# Patient Record
Sex: Female | Born: 1965 | Race: White | Hispanic: No
Health system: Midwestern US, Community
[De-identification: ages and names within clinical notes are randomized; demographics above are authoritative.]

## PROBLEM LIST (undated history)

## (undated) VITALS — BP 113/74 | HR 87 | Temp 97.5°F | Resp 16

## (undated) VITALS — BP 116/71 | HR 57 | Temp 97.7°F | Resp 16 | Ht 69.0 in | Wt 204.0 lb

## (undated) DIAGNOSIS — R1319 Other dysphagia: Secondary | ICD-10-CM

## (undated) DIAGNOSIS — F418 Other specified anxiety disorders: Secondary | ICD-10-CM

## (undated) DIAGNOSIS — R251 Tremor, unspecified: Secondary | ICD-10-CM

## (undated) DIAGNOSIS — K2100 Gastro-esophageal reflux disease with esophagitis, without bleeding: Secondary | ICD-10-CM

## (undated) DIAGNOSIS — J101 Influenza due to other identified influenza virus with other respiratory manifestations: Secondary | ICD-10-CM

## (undated) DIAGNOSIS — N959 Unspecified menopausal and perimenopausal disorder: Secondary | ICD-10-CM

## (undated) DIAGNOSIS — J111 Influenza due to unidentified influenza virus with other respiratory manifestations: Principal | ICD-10-CM

## (undated) DIAGNOSIS — M25511 Pain in right shoulder: Secondary | ICD-10-CM

## (undated) DIAGNOSIS — Z1231 Encounter for screening mammogram for malignant neoplasm of breast: Principal | ICD-10-CM

## (undated) DIAGNOSIS — K222 Esophageal obstruction: Secondary | ICD-10-CM

## (undated) DIAGNOSIS — F419 Anxiety disorder, unspecified: Secondary | ICD-10-CM

## (undated) DIAGNOSIS — F329 Major depressive disorder, single episode, unspecified: Secondary | ICD-10-CM

## (undated) DIAGNOSIS — F609 Personality disorder, unspecified: Secondary | ICD-10-CM

## (undated) DIAGNOSIS — R51 Headache: Secondary | ICD-10-CM

## (undated) DIAGNOSIS — M1990 Unspecified osteoarthritis, unspecified site: Secondary | ICD-10-CM

## (undated) DIAGNOSIS — F32A Depression, unspecified: Secondary | ICD-10-CM

## (undated) DIAGNOSIS — Z8659 Personal history of other mental and behavioral disorders: Secondary | ICD-10-CM

## (undated) DIAGNOSIS — E785 Hyperlipidemia, unspecified: Secondary | ICD-10-CM

## (undated) DIAGNOSIS — I10 Essential (primary) hypertension: Secondary | ICD-10-CM

## (undated) DIAGNOSIS — F410 Panic disorder [episodic paroxysmal anxiety] without agoraphobia: Secondary | ICD-10-CM

## (undated) DIAGNOSIS — F411 Generalized anxiety disorder: Secondary | ICD-10-CM

## (undated) DIAGNOSIS — M25512 Pain in left shoulder: Secondary | ICD-10-CM

## (undated) DIAGNOSIS — G4700 Insomnia, unspecified: Secondary | ICD-10-CM

## (undated) DIAGNOSIS — N950 Postmenopausal bleeding: Secondary | ICD-10-CM

## (undated) DIAGNOSIS — Z5181 Encounter for therapeutic drug level monitoring: Secondary | ICD-10-CM

## (undated) HISTORY — PX: FOOT SURGERY: SHX648

## (undated) HISTORY — DX: Personality disorder, unspecified: F60.9

## (undated) HISTORY — DX: Hyperlipidemia, unspecified: E78.5

## (undated) HISTORY — DX: Personal history of other mental and behavioral disorders: Z86.59

## (undated) HISTORY — PX: TOTAL ABDOMINAL HYSTERECTOMY W/ BILATERAL SALPINGOOPHORECTOMY: SHX83

---

## 2001-04-08 ENCOUNTER — Other Ambulatory Visit: Admission: RE | Admit: 2001-04-08 | Discharge: 2001-04-08 | Payer: Self-pay | Admitting: Obstetrics and Gynecology

## 2004-03-27 ENCOUNTER — Encounter: Admission: RE | Admit: 2004-03-27 | Discharge: 2004-06-25 | Payer: Self-pay | Admitting: Obstetrics & Gynecology

## 2004-05-10 ENCOUNTER — Ambulatory Visit (HOSPITAL_COMMUNITY): Admission: AD | Admit: 2004-05-10 | Discharge: 2004-05-10 | Payer: Self-pay | Admitting: Obstetrics & Gynecology

## 2004-05-11 ENCOUNTER — Inpatient Hospital Stay (HOSPITAL_COMMUNITY): Admission: AD | Admit: 2004-05-11 | Discharge: 2004-05-15 | Payer: Self-pay | Admitting: Obstetrics & Gynecology

## 2004-07-06 ENCOUNTER — Ambulatory Visit (HOSPITAL_COMMUNITY): Admission: RE | Admit: 2004-07-06 | Discharge: 2004-07-06 | Payer: Self-pay | Admitting: Obstetrics & Gynecology

## 2006-02-10 ENCOUNTER — Emergency Department (HOSPITAL_COMMUNITY): Admission: EM | Admit: 2006-02-10 | Discharge: 2006-02-10 | Payer: Self-pay | Admitting: Emergency Medicine

## 2006-02-10 ENCOUNTER — Inpatient Hospital Stay (HOSPITAL_COMMUNITY): Admission: RE | Admit: 2006-02-10 | Discharge: 2006-02-16 | Payer: Self-pay | Admitting: Psychiatry

## 2006-02-11 ENCOUNTER — Ambulatory Visit: Payer: Self-pay | Admitting: Psychiatry

## 2006-04-15 ENCOUNTER — Ambulatory Visit (HOSPITAL_COMMUNITY): Payer: Self-pay | Admitting: Psychiatry

## 2006-04-23 ENCOUNTER — Ambulatory Visit (HOSPITAL_COMMUNITY): Admission: RE | Admit: 2006-04-23 | Discharge: 2006-04-23 | Payer: Self-pay | Admitting: Obstetrics & Gynecology

## 2006-04-23 ENCOUNTER — Encounter (INDEPENDENT_AMBULATORY_CARE_PROVIDER_SITE_OTHER): Payer: Self-pay | Admitting: Specialist

## 2006-05-22 ENCOUNTER — Ambulatory Visit (HOSPITAL_COMMUNITY): Payer: Self-pay | Admitting: Psychiatry

## 2006-07-17 ENCOUNTER — Ambulatory Visit (HOSPITAL_COMMUNITY): Payer: Self-pay | Admitting: Psychiatry

## 2006-08-14 ENCOUNTER — Ambulatory Visit (HOSPITAL_COMMUNITY): Payer: Self-pay | Admitting: Psychiatry

## 2006-09-17 ENCOUNTER — Inpatient Hospital Stay (HOSPITAL_COMMUNITY): Admission: EM | Admit: 2006-09-17 | Discharge: 2006-09-22 | Payer: Self-pay | Admitting: Psychiatry

## 2006-09-17 ENCOUNTER — Ambulatory Visit: Payer: Self-pay | Admitting: Psychiatry

## 2006-12-16 ENCOUNTER — Ambulatory Visit (HOSPITAL_COMMUNITY): Payer: Self-pay | Admitting: Psychiatry

## 2007-01-03 ENCOUNTER — Inpatient Hospital Stay (HOSPITAL_COMMUNITY): Admission: EM | Admit: 2007-01-03 | Discharge: 2007-01-05 | Payer: Self-pay | Admitting: Emergency Medicine

## 2007-01-05 ENCOUNTER — Ambulatory Visit: Payer: Self-pay | Admitting: Psychiatry

## 2007-01-05 ENCOUNTER — Inpatient Hospital Stay (HOSPITAL_COMMUNITY): Admission: RE | Admit: 2007-01-05 | Discharge: 2007-01-12 | Payer: Self-pay | Admitting: Psychiatry

## 2007-01-20 ENCOUNTER — Ambulatory Visit (HOSPITAL_COMMUNITY): Payer: Self-pay | Admitting: Psychiatry

## 2007-03-12 ENCOUNTER — Ambulatory Visit (HOSPITAL_COMMUNITY): Payer: Self-pay | Admitting: Psychiatry

## 2007-04-09 ENCOUNTER — Ambulatory Visit (HOSPITAL_COMMUNITY): Admission: RE | Admit: 2007-04-09 | Discharge: 2007-04-09 | Payer: Self-pay | Admitting: Obstetrics & Gynecology

## 2007-04-30 ENCOUNTER — Ambulatory Visit (HOSPITAL_COMMUNITY): Payer: Self-pay | Admitting: Psychiatry

## 2007-06-30 ENCOUNTER — Ambulatory Visit (HOSPITAL_COMMUNITY): Admission: RE | Admit: 2007-06-30 | Discharge: 2007-06-30 | Payer: Self-pay | Admitting: Pediatrics

## 2007-09-30 ENCOUNTER — Ambulatory Visit (HOSPITAL_COMMUNITY): Admission: RE | Admit: 2007-09-30 | Discharge: 2007-09-30 | Payer: Self-pay | Admitting: Orthopaedic Surgery

## 2007-10-20 ENCOUNTER — Ambulatory Visit (HOSPITAL_COMMUNITY): Payer: Self-pay | Admitting: Psychiatry

## 2010-08-08 ENCOUNTER — Emergency Department (HOSPITAL_COMMUNITY): Admission: EM | Admit: 2010-08-08 | Discharge: 2010-08-08 | Payer: Self-pay | Admitting: Emergency Medicine

## 2010-08-09 ENCOUNTER — Ambulatory Visit: Payer: Self-pay | Admitting: Psychiatry

## 2010-08-09 ENCOUNTER — Inpatient Hospital Stay (HOSPITAL_COMMUNITY): Admission: RE | Admit: 2010-08-09 | Discharge: 2010-08-11 | Payer: Self-pay | Admitting: Psychiatry

## 2010-11-13 ENCOUNTER — Other Ambulatory Visit
Admission: RE | Admit: 2010-11-13 | Discharge: 2010-11-13 | Payer: Self-pay | Source: Home / Self Care | Admitting: Obstetrics & Gynecology

## 2011-01-08 ENCOUNTER — Inpatient Hospital Stay (HOSPITAL_COMMUNITY)
Admission: RE | Admit: 2011-01-08 | Discharge: 2011-01-14 | Payer: Self-pay | Source: Home / Self Care | Attending: Psychiatry | Admitting: Psychiatry

## 2011-01-14 LAB — CBC
HCT: 42 % (ref 36.0–46.0)
Hemoglobin: 14 g/dL (ref 12.0–15.0)
MCH: 27.3 pg (ref 26.0–34.0)
MCHC: 33.3 g/dL (ref 30.0–36.0)
MCV: 81.9 fL (ref 78.0–100.0)
Platelets: 193 10*3/uL (ref 150–400)
RBC: 5.13 MIL/uL — ABNORMAL HIGH (ref 3.87–5.11)
RDW: 13.3 % (ref 11.5–15.5)
WBC: 9.3 10*3/uL (ref 4.0–10.5)

## 2011-01-14 LAB — BENZODIAZEPINE, QUANTITATIVE, URINE
Alprazolam (GC/LC/MS), ur confirm: 185 NG/ML — ABNORMAL HIGH
Flurazepam GC/MS Conf: NEGATIVE NG/ML
Lorazepam UR QT: NEGATIVE NG/ML
Nordiazepam GC/MS Conf: NEGATIVE NG/ML
Oxazepam GC/MS Conf: NEGATIVE NG/ML
Temazepam GC/MS Conf: NEGATIVE NG/ML

## 2011-01-14 LAB — GLUCOSE, CAPILLARY
Glucose-Capillary: 106 mg/dL — ABNORMAL HIGH (ref 70–99)
Glucose-Capillary: 140 mg/dL — ABNORMAL HIGH (ref 70–99)
Glucose-Capillary: 110 mg/dL — ABNORMAL HIGH (ref 70–99)
Glucose-Capillary: 150 mg/dL — ABNORMAL HIGH (ref 70–99)
Glucose-Capillary: 127 mg/dL — ABNORMAL HIGH (ref 70–99)
Glucose-Capillary: 175 mg/dL — ABNORMAL HIGH (ref 70–99)
Glucose-Capillary: 110 mg/dL — ABNORMAL HIGH (ref 70–99)
Glucose-Capillary: 84 mg/dL (ref 70–99)
Glucose-Capillary: 195 mg/dL — ABNORMAL HIGH (ref 70–99)
Glucose-Capillary: 138 mg/dL — ABNORMAL HIGH (ref 70–99)
Glucose-Capillary: 96 mg/dL (ref 70–99)
Glucose-Capillary: 95 mg/dL (ref 70–99)
Glucose-Capillary: 145 mg/dL — ABNORMAL HIGH (ref 70–99)
Glucose-Capillary: 126 mg/dL — ABNORMAL HIGH (ref 70–99)
Glucose-Capillary: 104 mg/dL — ABNORMAL HIGH (ref 70–99)
Glucose-Capillary: 89 mg/dL (ref 70–99)
Glucose-Capillary: 120 mg/dL — ABNORMAL HIGH (ref 70–99)
Glucose-Capillary: 114 mg/dL — ABNORMAL HIGH (ref 70–99)
Glucose-Capillary: 104 mg/dL — ABNORMAL HIGH (ref 70–99)
Glucose-Capillary: 84 mg/dL (ref 70–99)
Glucose-Capillary: 130 mg/dL — ABNORMAL HIGH (ref 70–99)
Glucose-Capillary: 107 mg/dL — ABNORMAL HIGH (ref 70–99)

## 2011-01-14 LAB — URINALYSIS, ROUTINE W REFLEX MICROSCOPIC
Bilirubin Urine: NEGATIVE
Hgb urine dipstick: NEGATIVE
Ketones, ur: NEGATIVE mg/dL
Nitrite: NEGATIVE
Protein, ur: NEGATIVE mg/dL
Specific Gravity, Urine: 1.026 (ref 1.005–1.030)
Urine Glucose, Fasting: NEGATIVE mg/dL
Urobilinogen, UA: 1 mg/dL (ref 0.0–1.0)
pH: 6 (ref 5.0–8.0)

## 2011-01-14 LAB — TSH: TSH: 1.932 u[IU]/mL (ref 0.350–4.500)

## 2011-01-14 LAB — COMPREHENSIVE METABOLIC PANEL
ALT: 26 U/L (ref 0–35)
AST: 29 U/L (ref 0–37)
Albumin: 3.7 g/dL (ref 3.5–5.2)
Alkaline Phosphatase: 94 U/L (ref 39–117)
BUN: 9 mg/dL (ref 6–23)
CO2: 29 mEq/L (ref 19–32)
Calcium: 9.8 mg/dL (ref 8.4–10.5)
Chloride: 104 mEq/L (ref 96–112)
Creatinine, Ser: 0.86 mg/dL (ref 0.4–1.2)
GFR calc Af Amer: >60 mL/min (ref >60)
GFR calc non Af Amer: >60 mL/min (ref >60)
Glucose, Bld: 134 mg/dL — ABNORMAL HIGH (ref 70–99)
Potassium: 4.5 mEq/L (ref 3.5–5.1)
Sodium: 140 mEq/L (ref 135–145)
Total Bilirubin: 0.3 mg/dL (ref 0.3–1.2)
Total Protein: 7.1 g/dL (ref 6.0–8.3)

## 2011-01-14 LAB — URINE DRUGS OF ABUSE SCREEN W ALC, ROUTINE (REF LAB)
Amphetamine Screen, Ur: NEGATIVE
Barbiturate Quant, Ur: NEGATIVE
Benzodiazepines.: POSITIVE — AB
Cocaine Metabolites: NEGATIVE
Creatinine,U: 181.3 mg/dL
Ethyl Alcohol: <10 mg/dL (ref <10)
Marijuana Metabolite: NEGATIVE
Methadone: NEGATIVE
Opiate Screen, Urine: NEGATIVE
Phencyclidine (PCP): NEGATIVE
Propoxyphene: NEGATIVE

## 2011-01-14 LAB — PREGNANCY, URINE: Preg Test, Ur: NEGATIVE

## 2011-01-14 LAB — URINE MICROSCOPIC-ADD ON

## 2011-01-16 LAB — GLUCOSE, CAPILLARY: Glucose-Capillary: 101 mg/dL — ABNORMAL HIGH (ref 70–99)

## 2011-01-24 NOTE — Discharge Summary (Addendum)
[name], [name]                ACCOUNT NO.:  [id number]  MEDICAL RECORD NO.:  [id number]          PATIENT TYPE:  IPS  LOCATION:  0305                          [facility]ACILITY:  [facility]  PHYSICIAN:  Eulogio Ditch, MD DATE OF BIRTH:  Sep 05, 1966  DATE OF ADMISSION:  01/08/2011 DATE OF DISCHARGE:  01/14/2011                              DISCHARGE SUMMARY   IDENTIFYING INFORMATION:  This is a 45 year old married female.  This is a voluntary admission.  HISTORY OF THE PRESENT ILLNESS:  The second [facility] admission for [facility] who presents with suicidal thoughts and a plan to overdose but was also thinking about shooting herself with a gun, although she had no weapons at home.  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  Panic attacks preventing her from being employed. Also reports having some racing thoughts, thinking of jumping in front of a train.  She reports her most significant stressor is her sister's death, which she continues to grieve.  She endorses dealing with a lot of losses, including her own health problems being a diabetic, grief over the death of her sister in 08/06/11at the age of 19 of ovarian cancer after a 39-month illness.  She has several siblings, but was closest to this sister.  Also dealing with family who are cynical and sarcastic about the patient's illness and symptoms.  She is an outpatient client at [facility] in [place]. She has a history of depression and borderline personality disorder and reports overdosing at least 6 to 7 times in the past.  She is on disability and cares for 2 children, ages 32 and 44, at home.  MEDICAL EVALUATION AND DIAGNOSTIC STUDIES:  Primary care provider is Dr. Joellyn Haff has been managing her medications.  CHRONIC MEDICAL PROBLEMS: 1. Type  2 diabetes. 2. Hypertension.  A full physical exam was done in the emergency room     and is noted in the record.  DIAGNOSTIC STUDIES:  Were unremarkable.  Urine drug screen was positive for benzodiazepines.  TSH 1.932.  Urine pregnancy test negative and urinalysis revealed WBCs 7-10 per high- powered field.  ADMITTING MENTAL STATUS EXAM:  Fully alert female, cooperative, quite demeanor.  Good eye contact.  Good historian of her symptoms and circumstances.  Thinking coherent, goal-directed.  She was able to promise safety on the unit.  No imminent thoughts of suicide while here. No evidence of psychosis.  No delusional statements or flight of ideas evidenced.  Cognitive function intact.  Memory intact.  Her concerns and questions all appropriate and relevant.  ADMITTING DIAGNOSES:  AXIS I:  Major depressive disorder. AXIS II:  Deferred. AXIS III:  History of diabetes and hypertension, pyuria not otherwise specified. AXIS IV:  Problems related to psychosocial issues, lack of employment and bereavement. AXIS V:  Current 35.  COURSE OF HOSPITALIZATION:  She was admitted to our mood disorders program and we elected to gradually taper her off her Effexor as she initially felt that this  was not working very well for her and that she had not felt much improvement in her panic or anxiety or her depression. She had felt that she had done well on Cymbalta in the past so we elected to restart her Cymbalta and augment that with Abilify 2 mg p.o. q.h.s..  She was gradually assimilated into the milieu while here.  Her participation in group therapy and unit activities was appropriate.  Her pyuria remained asymptomatic and she was not treated for a urinary tract infection.  We gradually increased her Abilify to 5 mg p.o. q.h.s., which she tolerated well.  When we increased her Cymbalta 60 mg daily on January 13 she began to complain of headache, which was persistent.  She was attributing it  to the Cymbalta so we ultimately discontinue the Cymbalta and returned to use of the Effexor.  We also did decrease her alprazolam from 2 mg twice a day to 0.5 mg in the morning and 1 mg p.o. q.h.s.  She was also on sliding scale insulin on a sensitive protocol while here.  Her headache was ultimately relieved within 24 hours of discontinuing the Cymbalta and she also took Excedrin Migraine with good relief.  By January 16, she was in full contact with reality.  No suicidal thoughts, feeling good, relaxed and wanted to try continuing on just 75 mg of the Effexor along with the Abilify that she was taking, felt good about this and was tolerating the decrease in the alprazolam dose.  DISCHARGE MENTAL STATUS EXAM:  Revealed a fully alert female, pleasant, cooperative with no dangerous thoughts.  Memory intact.  Fully participating in the milieu and requesting to go home and see her children.  DISCHARGE MEDICATIONS: 1. Alprazolam 1 mg 1/2 tablet by mouth in the morning, 1 tablet at     bedtime. 2. Aripiprazole 5 mg q.h.s. 3. Venlafaxine 75 mg XR daily. 4. Metformin 500 mg b.i.d. 5. Estradiol and the medroxyprogesterone to be continued as previously     taking. 6. Metoprolol tartrate 25 mg 1 tablet b.i.d.  DISCHARGE/PLAN:  Follow up with Dr. Milford Cage in [place] on January 20 at 10:30 and with Sharlot Gowda and the patient will make her own appointment.  Also scheduled at [facility] on January 19 at 8 a.m.     Margaret A. Lorin Picket, N.P.   ______________________________ Eulogio Ditch, MD    MAS/MEDQ  D:  01/17/2011  T:  01/17/2011  Job:  [id number]  Electronically Signed by Kari Baars N.P. on 01/18/2011 11:41:19 AM Electronically Signed by Eulogio Ditch  on 01/24/2011 05:31:27 AM

## 2011-02-21 ENCOUNTER — Inpatient Hospital Stay (HOSPITAL_COMMUNITY)
Admission: RE | Admit: 2011-02-21 | Discharge: 2011-02-27 | DRG: 885 | Disposition: A | Discharge status: Home / Self Care | Payer: Medicare Other | Attending: Psychiatry | Admitting: Psychiatry

## 2011-02-21 DIAGNOSIS — F410 Panic disorder [episodic paroxysmal anxiety] without agoraphobia: Secondary | ICD-10-CM

## 2011-02-21 DIAGNOSIS — F1320 Sedative, hypnotic or anxiolytic dependence, uncomplicated: Secondary | ICD-10-CM

## 2011-02-21 DIAGNOSIS — I10 Essential (primary) hypertension: Secondary | ICD-10-CM

## 2011-02-21 DIAGNOSIS — F609 Personality disorder, unspecified: Secondary | ICD-10-CM

## 2011-02-21 DIAGNOSIS — E119 Type 2 diabetes mellitus without complications: Secondary | ICD-10-CM

## 2011-02-21 DIAGNOSIS — F39 Unspecified mood [affective] disorder: Principal | ICD-10-CM

## 2011-02-21 DIAGNOSIS — R45851 Suicidal ideations: Secondary | ICD-10-CM

## 2011-02-22 DIAGNOSIS — F39 Unspecified mood [affective] disorder: Secondary | ICD-10-CM

## 2011-02-22 LAB — GLUCOSE, CAPILLARY: Glucose-Capillary: 124 mg/dL — ABNORMAL HIGH (ref 70–99)

## 2011-02-23 LAB — GLUCOSE, CAPILLARY: Glucose-Capillary: 114 mg/dL — ABNORMAL HIGH (ref 70–99)

## 2011-02-24 LAB — GLUCOSE, CAPILLARY
Glucose-Capillary: 142 mg/dL — ABNORMAL HIGH (ref 70–99)
Glucose-Capillary: 102 mg/dL — ABNORMAL HIGH (ref 70–99)

## 2011-02-25 LAB — GLUCOSE, CAPILLARY
Glucose-Capillary: 111 mg/dL — ABNORMAL HIGH (ref 70–99)
Glucose-Capillary: 113 mg/dL — ABNORMAL HIGH (ref 70–99)
Glucose-Capillary: 122 mg/dL — ABNORMAL HIGH (ref 70–99)
Glucose-Capillary: 143 mg/dL — ABNORMAL HIGH (ref 70–99)

## 2011-02-26 LAB — GLUCOSE, CAPILLARY
Glucose-Capillary: 119 mg/dL — ABNORMAL HIGH (ref 70–99)
Glucose-Capillary: 155 mg/dL — ABNORMAL HIGH (ref 70–99)

## 2011-02-27 LAB — GLUCOSE, CAPILLARY: Glucose-Capillary: 115 mg/dL — ABNORMAL HIGH (ref 70–99)
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
## 2011-02-28 NOTE — Discharge Summary (Signed)
NAMESHAKENDRA, [name]                ACCOUNT NO.:  [id number]  MEDICAL RECORD NO.:  [id number]           PATIENT TYPE:  I  LOCATION:  0304                          FACILITY:  [facility]  PHYSICIAN:  Anselm Jungling, MD  DATE OF BIRTH:  1966-10-18  DATE OF ADMISSION:  02/21/2011 DATE OF DISCHARGE:  02/27/2011                              DISCHARGE SUMMARY   IDENTIFYING DATA/REASON FOR ADMISSION:  This was an inpatient psychiatric admission for [name], a 45 year old Caucasian female who was last admitted for service in January 2012, about 5 weeks prior.  She was readmitted because of increasing symptoms of anxiety and concern regarding Xanax dependence.  There was also concern about suicidal ideation.  Please refer to the admission note for further details pertaining to the symptoms, circumstances and history that led to her hospitalization.  She was given an initial Axis I diagnosis of benzodiazepine dependence.  MEDICAL AND LABORATORY:  The patient was medically and physically assessed by the psychiatric nurse practitioner.  She was in generally good health but came to [place] with a history of hypertension and diabetes mellitus.  She was continued on her usual lisinopril, hydrochlorothiazide, metoprolol and metformin.  She was also continued on her usual estradiol and medroxyprogesterone.  There were no other significant medical issues during her stay.  HOSPITAL COURSE:  The patient was admitted to the adult inpatient psychiatric service.  She presented as a depressed, anxious and tense- looking individual who was of above-average intelligence and education. Her thoughts and speech were normally organized.  There was nothing to suggest any psychosis or thought disorder.  She was able to give some acknowledgment to the fact that she had been over utilizing her Xanax prescription.  Apparently, her primary care physician became concerned about this and was not willing to prescribe Xanax any  further.  This placed her at risk for withdrawal symptoms that could have been medically hazardous.  She was detoxified utilizing a Librium withdrawal protocol.  Over the course of her stay, she was eventually able to accept that it was necessary for her to come off of Xanax and similar medications.  However, she continued to express concern about the future of her "agoraphobia."  Risperidone was introduced on a low-dose basis to address some of her manifest anxiety.  However, it did not appear to be a medication that was going to have much promise for her in the long run and was not continued at the time of discharge.  She participated in therapeutic groups and activities geared toward helping her acquire better coping skills, a better understanding of her underlying disorders and dynamics, and the development of an aftercare plan.  She also attended 12-step groups during her stay.  On the fifth hospital day, the patient reported that her mood was much better and described it as being like "a light switch turned on."  She indicated that she was no longer hopeless.  She continued to participate in the therapeutic program and was an excellent participant at all times.  She was absent of any active suicidal ideation at any time.  She was appropriate for  discharge on the seventh hospital day.  She agreed to following aftercare plan.  Prior to discharge, she indicated that she was not having any active suicidal ideation.  The suicide risk assessment was completed.  She was felt to be at minimal risk.  AFTERCARE:  The patient was to follow up with Otelia Limes with an appointment on March 04, 2011, at 9:45 a.m. and [facility] in [place] on March 01, 2011, at 8:00 a.m.  DISCHARGE MEDICATIONS: 1. Effexor XR 150 mg daily. 2. Estradiol 2 mg daily. 3. Lisinopril/hydrochlorothiazide 20/25 daily. 4. Medroxyprogesterone 2.5 mg daily. 5. Metformin 500 mg b.i.d. 6.  Metoprolol 25 mg b.i.d. 7. The patient was instructed to stop taking Xanax.  DISCHARGE DIAGNOSES:  AXIS I:  Benzodiazepine dependence, anxiety disorder, not otherwise specified. AXIS II:  Deferred. AXIS III:  History of hypertension, diabetes mellitus. AXIS IV:  Stressors severe. AXIS V:  GAF on discharge 50.     Anselm Jungling, MD     SPB/MEDQ  D:  02/28/2011  T:  02/28/2011  Job:  [id number]  Electronically Signed by Geralyn Flash MD on 02/28/2011 03:27:36 PM

## 2011-03-15 LAB — DIFFERENTIAL
Basophils Absolute: 0.1 10*3/uL (ref 0.0–0.1)
Basophils Relative: 1 % (ref 0–1)
Eosinophils Absolute: 0.2 10*3/uL (ref 0.0–0.7)
Eosinophils Relative: 2 % (ref 0–5)
Monocytes Absolute: 0.6 10*3/uL (ref 0.1–1.0)
Monocytes Relative: 6 % (ref 3–12)
Neutro Abs: 5.5 10*3/uL (ref 1.7–7.7)

## 2011-03-15 LAB — GLUCOSE, CAPILLARY: Glucose-Capillary: 124 mg/dL — ABNORMAL HIGH (ref 70–99)

## 2011-03-15 LAB — CBC
HCT: 42.1 % (ref 36.0–46.0)
Hemoglobin: 14.1 g/dL (ref 12.0–15.0)
MCH: 26.9 pg (ref 26.0–34.0)
MCHC: 33.6 g/dL (ref 30.0–36.0)
MCV: 80.1 fL (ref 78.0–100.0)
Platelets: 189 10*3/uL (ref 150–400)
RBC: 5.26 MIL/uL — ABNORMAL HIGH (ref 3.87–5.11)
RDW: 13.6 % (ref 11.5–15.5)
WBC: 8.9 10*3/uL (ref 4.0–10.5)

## 2011-03-15 LAB — URINALYSIS, ROUTINE W REFLEX MICROSCOPIC
Bilirubin Urine: NEGATIVE
Glucose, UA: NEGATIVE mg/dL
Hgb urine dipstick: NEGATIVE
Ketones, ur: NEGATIVE mg/dL
Nitrite: NEGATIVE
Protein, ur: NEGATIVE mg/dL
Specific Gravity, Urine: 1.015 (ref 1.005–1.030)
Urobilinogen, UA: 0.2 mg/dL (ref 0.0–1.0)
pH: 5.5 (ref 5.0–8.0)

## 2011-03-15 LAB — RAPID URINE DRUG SCREEN, HOSP PERFORMED
Amphetamines: NOT DETECTED
Barbiturates: NOT DETECTED
Benzodiazepines: POSITIVE — AB
Cocaine: NOT DETECTED
Opiates: NOT DETECTED
Tetrahydrocannabinol: NOT DETECTED

## 2011-03-15 LAB — ETHANOL: Alcohol, Ethyl (B): <5 mg/dL (ref 0–10)

## 2011-03-15 LAB — BASIC METABOLIC PANEL
BUN: 9 mg/dL (ref 6–23)
CO2: 30 mEq/L (ref 19–32)
Calcium: 9.7 mg/dL (ref 8.4–10.5)
Chloride: 101 mEq/L (ref 96–112)
Creatinine, Ser: 0.9 mg/dL (ref 0.4–1.2)
GFR calc Af Amer: >60 mL/min (ref >60)
GFR calc non Af Amer: >60 mL/min (ref >60)
Glucose, Bld: 106 mg/dL — ABNORMAL HIGH (ref 70–99)
Potassium: 4.3 mEq/L (ref 3.5–5.1)
Sodium: 139 mEq/L (ref 135–145)

## 2011-03-15 LAB — PREGNANCY, URINE: Preg Test, Ur: NEGATIVE

## 2011-05-17 NOTE — Op Note (Signed)
NAME:  [name], [name]                          ACCOUNT NO.:  [id number]   MEDICAL RECORD NO.:  [id number]                   PATIENT TYPE:  AMB   LOCATION:  DAY                                  FACILITY:  [facility]   PHYSICIAN:  Lazaro Arms, M.D.                DATE OF BIRTH:  07/29/1966   DATE OF PROCEDURE:  07/06/2004  DATE OF DISCHARGE:                                 OPERATIVE REPORT   PREOPERATIVE DIAGNOSES:  1. Bilateral pelvic pain.  2. Menstrual migraines, unresponsive.   POSTOPERATIVE DIAGNOSES:  1. Bilateral pelvic pain.  2. Menstrual migraines, unresponsive.   PROCEDURE:  Laparoscopic bilateral salpingo-oophorectomy.   SURGEON:  Lazaro Arms, M.D.   ANESTHESIA:  General endotracheal.   FINDINGS:  The patient had what appeared to be a normal pelvis.  Uterus,  tubes and ovaries were normal.  The upper and lower quadrants were normal.   DESCRIPTION OF OPERATION:  The patient was taken to the operating room and  placed in supine position, where she underwent general endotracheal  anesthesia, placed in dorsal lithotomy position and prepped and draped in  the usual sterile fashion.  A Foley catheter was placed.  Incision was made  in the umbilicus.  Veress needle was placed into the peritoneal cavity with  1 pass.  Abdomen was insufflated.  A non-bladed trocar was then placed in  the peritoneal cavity with 1 pass.  Peritoneal cavity was confirmed.  Insufflation was continued.  A 5-mm trocar and an 11-mm trocar was placed in  the midline suprapubic region and the right lower quadrant, respectively,  without difficulty under direct continuous visualization.  The right ovary  was grasped and the 10-mm Gyrus forceps was used.  A cautery of the  infundibulopelvic ligament was performed and it was cut at the same time;  there was good hemostasis.  The right ovary and tube were removed  hemostatically.  The left ovary and tube were then grasped.  The Gyrus 10-mm  forceps was  used.  The infundibulopelvic ligament was coagulated and cut and  the utero-ovarian ligament was coagulated and cut.  Both ovaries were  liberated.  I could not get them both out in the same EndoCatch bag, so I  had to use separate new Catch bags to remove them out of the right lower  quadrant.  Both pedicles were hemostatic.  Irrigation was performed and  again, pedicles were hemostatic.  The gas was allowed to escape.  The fascia  of the umbilical and right lower quadrant incisions were closed with 0  Vicryl sutures and the skin was closed using skin staples bilaterally and  the suprapubic skin was closed as well with skin staples.  Marcaine 0.5% was  injected in each site for postoperative pain.  The patient tolerated the  procedure well, she experienced minimal blood loss and was taken to the  recovery room in good  and stable condition.  All counts were correct.  She  received Ancef prophylactically.  All specimens went to the lab.      ___________________________________________                                            Lazaro Arms, M.D.   LHE/MEDQ  D:  07/06/2004  T:  07/06/2004  Job:  [id number]

## 2011-05-17 NOTE — Op Note (Signed)
[name], [name]                ACCOUNT NO.:  [id number]   MEDICAL RECORD NO.:  [id number]          PATIENT TYPE:  AMB   LOCATION:  DAY                           FACILITY:  [facility]   PHYSICIAN:  Lazaro Arms, M.D.   DATE OF BIRTH:  10/18/1966   DATE OF PROCEDURE:  04/23/2006  DATE OF DISCHARGE:  04/23/2006                                 OPERATIVE REPORT   PREOPERATIVE DIAGNOSES:  1.  Menometrorrhagia.  2.  Thickened stripe.   POSTOPERATIVE DIAGNOSIS:  1.  Menometrorrhagia.  2.  Thickened stripe.   PROCEDURE:  Hysterectomy, dilatation and curettage, endometrial ablation.   SURGEON:  Lazaro Arms, M.D.   ANESTHESIA:  General endotracheal.   FINDINGS:  Normal endometrial cavity.   DESCRIPTION OF OPERATION:  The patient was taken to the operating room and  placed in supine position, underwent general endotracheal anesthesia.  Placed in dorsal lithotomy position, prepped and draped in the usual sterile  fashion.  The cervix was grasped.  Dilated serially to allow passage of the  hysteroscope.  Hysteroscopy was performed and found to be normal.  A  vigorous uterine curettage was then performed.  Good uterine cry was  obtained in all areas.  The ThermaChoice III balloon was then used and  inflated to maintain a pressure of 190 mmHg or greater and heated to 87  degrees Celsius for the entire period of time.  The therapy time was then  completed.  All the fluid was obtained at the end of the procedure.  The  patient tolerated the procedure well.  She experienced minimal blood loss  and was taken to recovery in good and stable condition.  All counts were  correct.      Lazaro Arms, M.D.  Electronically Signed     LHE/MEDQ  D:  06/30/2006  T:  06/30/2006  Job:  [id number]

## 2011-05-17 NOTE — H&P (Signed)
[name], SNEED                ACCOUNT NO.:  [id number]   MEDICAL RECORD NO.:  [id number]          PATIENT TYPE:  AMB   LOCATION:  DAY                           FACILITY:  [facility]   PHYSICIAN:  Lazaro Arms, M.D.   DATE OF BIRTH:  [birth year]   DATE OF ADMISSION:  04/23/2006  DATE OF DISCHARGE:  LH                                HISTORY & PHYSICAL   HISTORY OF PRESENT ILLNESS:  [name] is a 45 year old white female who is  actually status post an oophorectomy because of migraine headaches.  The  patient, at this time, did not want to undergo hysterectomy and she has  horrible migraines and they were significantly exacerbated with her menses  and, as a result, I did a laparoscopic oophorectomy back in July 2005.  Her  migraine headaches have been significantly better since that time and she  has been able to return to work and a relatively normal life.  However, we  have been unable to control her bleeding with her hormone replacement  therapy.  We have used Megace, Provera, and coming off the Provera also  triggers migraines.  As a result, we are going to proceed with a  hysteroscopy, D&C, and endometrial ablation.   PAST MEDICAL HISTORY:  Significant for migraine headaches, depression, and  panic attacks.   PAST SURGICAL HISTORY:  She has had foot surgery as well as her laparoscopic  BSO.   PAST OB HISTORY:  Two vaginal deliveries.   CURRENT MEDICATIONS:  Cymbalta, Neurontin, Valium, aspirin, all of which  [facility].   REVIEW OF SYSTEMS:  Otherwise, negative.   PHYSICAL EXAMINATION:  HEENT:  Unremarkable.  NECK:  Thyroid normal.  LUNGS:  Clear.  HEART:  Regular rate and rhythm without murmurs, gallops, and rubs.  BREASTS:  Without masses, discharge, or skin changes.  ABDOMEN:  Benign, no hepatosplenomegaly or masses.  PELVIC:  She has normal external genitalia. Vagina pink and moist without  discharge.  Uterus normal size, shape and contour.  Adnexa negative.  EXTREMITIES:  Warm, no edema.   IMPRESSION:  Menometrorrhagia on hormone replacement therapy.   PLAN:  The patient is admitted for hysteroscopy, D&C, and endometrial  ablation.  She understands the risks, benefits, indications and  alternatives, and will proceed.      Lazaro Arms, M.D.  Electronically Signed     LHE/MEDQ  D:  04/22/2006  T:  04/22/2006  Job:  [id number]
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
## 2011-05-17 NOTE — H&P (Signed)
[name]                ACCOUNT NO.:  [id number]   MEDICAL RECORD NO.:  [id number]          PATIENT TYPE:  INP   LOCATION:  A219                          FACILITY:  [facility]   PHYSICIAN:  Margaretmary Dys, M.D.DATE OF BIRTH:  12/07/1966   DATE OF ADMISSION:  01/03/2007  DATE OF DISCHARGE:  LH                              HISTORY & PHYSICAL   PRIMARY CARE PHYSICIAN:  Unassigned.   ADMISSION DIAGNOSES:  1. Drug overdose.  2. History of severe depression.  3. Tachycardia.   CHIEF COMPLAINT:  The patient took multiple pills earlier today.   HISTORY OF PRESENT ILLNESS:  [name] is a 45 year old Caucasian  female who was brought into the emergency room after taking multiple  pills. The patient reports that at about 12 noon, she wrote a suicide  note and took several pills of Risperdal, Trazodone, Diazepam. The  patient reported that she may have taken about 10 to 15 pills of each.   Her suicide note was revealed in detail and she describes having  significant financial difficulties and being in severe depression and  not being able to take care of her 2 children. She reports that the  credit cards have been phoning her daily. She does not have a full-time  job and has not been able to meet with her financial obligations. Her  husband was present when I was talking to her. The patient, in the  emergency room, was found to be fairly drowsy, likely from those  medications but she was also tachycardiac. It was decided that prior to  sending her to a psychiatric facility, the patient's heart rate needed  to be brought under better control. She received a bolus of fluids in  the emergency room. The patient has tried to attempt suicide in the  past, about 10 to 12 years ago, which was at [facility]. According to the patient, she thinks she took 20 tablets of  Risperdal, 20 mg of Valium at 5 mg strength each, and 15 tablets of  Trazodone at 50 mg, and 6  tablets of Cymbalta at 60 mg. She does take  Lithium but did not overdose on the Lithium and her levels in the  emergency room was sub-therapeutic.   REVIEW OF SYSTEMS:  Difficult to obtain, as the patient was fairly  drowsy, although she told me that she felt comfortable. She denies any  headaches. No light headedness. No chest pain. No shortness of breath.  No abdominal pain.   PAST MEDICAL HISTORY:  1. Bipolar disorder.  2. Depression.  3. History of suicide attempt about 10 years ago.  4. History of panic attacks.  5. Bilateral salpingo-oophorectomy.   SOCIAL HISTORY:  The patient is married for about 17 years. She has 2  children, a 83 and a 42 year old. She denies any smoking, alcohol, or  illicit drug use. She does not work full time.   ALLERGIES:  NO KNOWN DRUG ALLERGIES.   FAMILY HISTORY:  Positive for history of hypertension, although the rest  was difficult to obtain.   PAST  SURGICAL HISTORY:  The patient had foot surgery in the past.   HOME MEDICATIONS:  1. Hydrocodone/acetaminophen 7.5/650 mg 1 to 2 tablets q.4 p.r.n.  2. Gabapentin 100 mg p.o. t.i.d.  3. Lithium tablets 150 mg p.o. once daily.  4. Oxycodone 5/500 1 to 2 tablets as needed.  5. Diazepam 5 mg p.o. b.i.d.  6. Trazodone 50 mg p.o. as needed.  7. Cymbalta 60 mg p.o. as needed.   PHYSICAL EXAMINATION:  GENERAL:  The patient is fairly drowsy but was  oriented to time, place, and person. Was easily arousable.  VITAL SIGNS:  On arrival in the emergency room, blood pressure was  145/80. Pulse was 126. This increased to 150 at one point. Respiratory  rate 18. Temperature 96.5. Oxygen saturation was 98% on room air.  HEENT:  Normocephalic and atraumatic. Oral mucosa was moist. Pupils were  sluggish but both reactive.  NECK:  Supple. No JVD or lymphadenopathy.  LUNGS:  Clear clinically. Good air entry bilaterally.  HEART:  S1 and S2. Tachycardiac. No S3, S4, gallops, or rubs.  ABDOMEN:  Soft, nontender.  Bowel sounds positive. No masses palpable.  EXTREMITIES:  No pitting pedal edema. No calf induration or tenderness  was noted.  NEUROLOGIC:  She was drowsy with no focal neurological deficits noted.   LABORATORY DATA:  White blood cell count was 6.0. Hemoglobin and  hematocrit 15 and 46.1. Platelet count was 218,000 with no left shift.  Sodium 135, potassium 3.7, chloride 100, CO2 25, glucose 145, BUN 10,  creatinine 0.72. Calcium 9.6. Cardiac enzymes were negative. Lithium  level was less than 0.25. Acetaminophen level was less than 10.  Salicylate level was less than 10. Urine toxicology screen was positive  for benzodiazepines. Urinalysis was negative.   A 12 lead EKG showed sinus tachycardiac with no acute STT changes.   ASSESSMENT:  [name] is a 45 year old Caucasian female  admitted with drug overdose. The patient reportedly took several pills,  as mentioned above at about 12 noon and then she called EMS about 30  minutes later. She called her husband prior to taking the pills but did  not talk to him. She also left a suicide note, detailing that she is  very depressed and has significant financial and social stressors. The  patient is still fairly drowsy, likely from the dose of medications and  she is also tachycardiac, the exact reason is unclear but I continue to  suspect it is probably related to some of the medications that she took.   PLAN:  Admit her at this time to telemetry. Will continue IV fluids at  200 cc an hour. Will put her on suicide precautions. Will hold all of  her home medications at this time, until we get a clear picture of what  she took and until sensorium improves. Once we have been able to do  this, we will get an ACT Team consult and plan to transfer her for  psychiatric help. I have discussed this plan with the husband and he  verbalized understanding.  The patient also realizes that she will have to be transferred to a  psychiatric  institution. Further discussions will be held with her when  her sensorium improves.      Margaretmary Dys, M.D.  Electronically Signed     AM/MEDQ  D:  01/04/2007  T:  01/04/2007  Job:  [id number]

## 2011-05-17 NOTE — Op Note (Signed)
NAME:  [name], [name]                          ACCOUNT NO.:  [id number]   MEDICAL RECORD NO.:  [id number]                   PATIENT TYPE:  INP   LOCATION:  A427                                 FACILITY:  [facility]   PHYSICIAN:  Lazaro Arms, M.D.                DATE OF BIRTH:  1966-09-07   DATE OF PROCEDURE:  05/11/2004  DATE OF DISCHARGE:                                 OPERATIVE REPORT   PROCEDURE:  Epidural placement.   INDICATION:  [name] is a 45 year old, gravida 2, para 1, in active phase of  labor requesting an epidural.   DESCRIPTION OF PROCEDURE:  She is placed in a sitting position after boluses  infused.  L2-L3 interspaces identified;  Betadine prep is used.  Local  anesthetic is injected.  A 17 gauge Tuohy needle is used after field drape  placed, loss of resistance technique employed, and the epidural space is  found without difficulty.  Using the loss of resistance technique, 10 mL of  0.125% bupivacaine is placed without ill effects.  The epidural catheter is  fed into the epidural space, and it is taped down 5 cm into the space.  Then  10 mL of 0.125% bupivacaine plain is given into the epidural catheter to  dose up the epidural.  It is then taped down.  The epidural continuous pump  in then hooked up at 12 mL/hr.  The patient is tolerating it well with no  ill effects.      ___________________________________________                                            Lazaro Arms, M.D.   LHE/MEDQ  D:  05/11/2004  T:  05/13/2004  Job:  [id number]

## 2011-05-17 NOTE — H&P (Signed)
[name], [name]                ACCOUNT NO.:  [id number]   MEDICAL RECORD NO.:  [id number]          PATIENT TYPE:  IPS   LOCATION:  0500                          FACILITY:  [facility]   PHYSICIAN:  Geoffery Lyons, M.D.      DATE OF BIRTH:  31-Aug-1966   DATE OF ADMISSION:  01/05/2007  DATE OF DISCHARGE:                       PSYCHIATRIC ADMISSION ASSESSMENT   IDENTIFICATION:  This is a 45 year old married white female.  This is a  voluntary admission.   HISTORY OF PRESENT ILLNESS:  The patient took an intentional overdose of  multiple medications and wrote a suicide note approximately lunchtime on  the January 03, 2007 and was subsequently admitted through the emergency  room for medical stabilization.  She took approximately 10-20 tablets  each of Risperdal 0.25 mg, diazepam 5 mg and trazodone 50 mg and then  also took approximately 6 tablets of Cymbalta 60 mg.  Her suicide note  notes that she feels hopeless about the fact that she is 45 years old,  her marriage is over.  Does not have a stable job and is being hounded  by [employer].  She notes that she hates herself and feels unable to be a  good mother and provide proper care and financial support to her two  children.  She sites chronic marital discord since she was married,  stating that she had been married for the past 17 years and they have  disagreed for 18 years.  She denies any homicidal thought,  hallucinations, paranoia or flight of ideas.   PAST PSYCHIATRIC HISTORY:  The patient is followed by Dr. Kathryne Sharper in  the [facility] outpatient clinic [place]  This is  her second admission to [facility] with a  previous admission being in September 2007.  She has been treated for  bipolar disorder and recently her outpatient physician added lithium to  her medication regimen but she had not filled it.  She reports being  stable in the past on Effexor XR which did the best for her.   She had  also recently been prescribed Risperdal 0.25 mg to take once a day at  night and had been taking it regularly.  In the past she has been  prescribed Prozac which gave her suicidal thoughts, Zoloft which she  felt did not work well, Tofranil and Paxil also which she felt did  nothing for her and she has been prescribed Celexa but does not remember  how well at work.  She has also taken Wellbutrin but does not remember  how effective it was.   SOCIAL HISTORY:  The patient married for 17 years with chronic discord.  Has two children ages 84 and 2.  She is currently unemployed.  No  history of substance abuse.  Lives at home with her husband and two  children.  Has had a lot of chronic problems with employment and having  inadequate finances over the course of the past year which she cites as  a stressor for her.  She has also cited menopausal state as a stressor  contributing to her depression.   Family history is noncontributory.   ALCOHOL AND DRUG HISTORY:  She denies any current or past history of  substance abuse.   MEDICAL HISTORY:  The patient is followed by Dr. Vivia Ewing, her  primary care physician.  Medical problems include edema and status post  polypharmacy overdose.   CURRENT MEDICATIONS:  1. Cymbalta 60 mg daily for about 1 year.  2. Valium 5 mg b.i.d.  3. Had been prescribed trazodone 50 mg but has not been taking that.  4. Previously on Neurontin 100 mg t.i.d.. also not taking.  5. Wears a Vivelle patch 1 mg.  6. Was prescribed lithium but did not take it.  7. Risperdal 0.25 mg at bedtime.   Drug allergies are none.   POSITIVE PHYSICAL FINDINGS:  GENERAL:  Well-nourished, well-developed  overweight female, 5 feet 4 inches tall, 206 pounds.  VITAL SIGNS:  Temperature 97.6, pulse 96, respirations 20, blood  pressure 146/81.  Full physical exam was done in the internal medicine  unit along with review of systems by Dr. Reather Littler as noted in the   record.  She was admitted from the emergency room to the medicine unit  for drowsiness after the overdose along with sinus tachycardia for close  observation and was transferred to behavioral health unit on the 7th.   DIAGNOSTIC STUDIES:  WBC 5.9, hemoglobin 11.2, hematocrit 33.7,  platelets 166,000, MCV 79.5.  Chemistries:  Sodium 136, potassium 3.9,  chloride 107, carbon dioxide 24, BUN 5, creatinine 0.8 and random  glucose 116.  Liver enzymes within normal limits.  SGOT 27, SGPT 29,  alkaline phosphatase 103 and total bilirubin 0.5.  TSH within normal  limits at 2.152. Cardiac markers were unremarkable.  Lithium level on  arrival in the ED was less than therapeutic at 0.25.  Salicylate and  acetaminophen levels were negative.  Urine drug screen positive for  benzodiazepines. UA within normal limits.   MENTAL STATUS EXAM:  Fully alert female, cooperative with blunted affect  but pleasant and appropriate.  Mood is depressed, feels very hopeless  about her marriage.  Frustrated with the relationship, feeling trapped,  nowhere to go.  Has two children at home.  No way to support herself if  she leaves home.  Speech is normal in production and fluency.  She is  articulate.  Expresses her thoughts adequately.  Mood is depressed.  Thought processes logical.  No evidence of psychosis.  Positive for  suicidal ideation, thinking of overdosing.  Able to contract for safety  on the unit.  No homicidal thought.  Cognition is intact, well  preserved.  She is fully alert in full contact with reality.  Concentration and calculation are intact.  Insight is adequate.  We note  that a written copy of her suicide note is in the record for reference.  She appears to be an accurate historian.   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  AXIS II:  Deferred.  AXIS III:  Edema.  Status post polypharmacy overdose. AXIS IV:  Severe marital discord, chronic and poor personal and  social  supports.  Financial problems are also an issue.  AXIS V:  Current 30, past year 77.   Plan is to voluntarily admit the patient with every 15 minute checks in  place with a goal of alleviating her suicidal thought.  She really feels  the Cymbalta is not helping her so we are going to discontinue that  at  this point.  She is interested in restarting Effexor since she felt that  that is the medication that really helped her a lot and she was able to  remain stable on that for quite a long period of time, so we are going  to start her on Effexor 37.5 mg daily.  Meanwhile, she does have some  significant 2+ pitting edema in her lower extremities and around her  hips and abdomen and today we will give her 40 mg of Lasix to reduce the  edema and will monitor her closely.  May give her  a second dose tomorrow  depending on her symptoms.  She is willing to  have her husband come for a session and we have her enrolled in  stabilization program and she is in agreement with the plan.   Estimated length of stay is 7 days.      Margaret A. Scott, N.P.      Geoffery Lyons, M.D.  Electronically Signed    MAS/MEDQ  D:  01/06/2007  T:  01/07/2007  Job:  [id number]

## 2011-05-17 NOTE — Discharge Summary (Signed)
[name], [name]                ACCOUNT NO.:  [id number]   MEDICAL RECORD NO.:  [id number]          PATIENT TYPE:  INP   LOCATION:  A219                          FACILITY:  [facility]   PHYSICIAN:  Osvaldo Shipper, MD     DATE OF BIRTH:  [birth year]   DATE OF ADMISSION:  01/03/2007  DATE OF DISCHARGE:  01/07/2008LH                               DISCHARGE SUMMARY   Please note the patient is actually being transferred to [facility] for inpatient psychiatric care.   Please review the H&P by Dr. Sherle Poe for details regarding the  patient's presenting illness.   DISCHARGE DIAGNOSIS:  1. Suicidal attempt with intentional drug overdose, requiring      inpatient psychiatric evaluation.  2. History of depression with history of suicidal attempts in the      past.  3. Sinus tachycardia, improved.   BRIEF HOSPITAL COURSE:  Briefly this is a 45 year old Caucasian female  who has a history of severe depression who was brought into the ED after  she took multiple pills. The patient is on multiple psychotropic agents  and she apparently overdosed on most of them.  The patient was quite  tachycardiac when she was evaluated in the ED; and hence she was  admitted to the hospital for further medical management.  Apparently  there was a suicidal note also found with the patient when she was  brought into the ED.  The note describes in detail what stressors the  patient is undergoing and mentions her 2 children as well.   The patient is admitted to the medical floor with a sitter.  She was  given some IV fluids, soon her tachycardia improved.  She was still  having a heart rate between 90 and 100.  She was without any symptoms  today.  She was tolerating p.o. intake quite well.  Her other vital  signs were pretty stable, saturating well as well.  Her blood work  showed no significant abnormalities.  TSH was normal.  Serum  acetaminophen, lithium, and salicylate levels were all  normal.  Urine  drug screen was positive for benzodiazepine.  There was no UTI that was  noted.   Hence, today we felt that the patient was medically stable to be sent  over to [facility] for further psychiatric care.   Act Team has been consulted, and they are initiating discussions with  the psychiatrist and the patient most likely will be transferred  sometime today.   DISCHARGE MEDICATIONS:  I am not starting this patient on any  psychiatric medications at this time.  Based on the H&P she was on:  Gabapentin, lithium, diazepam, Trazodone, Cymbalta.  She was also on  oxycodone and hydrocodone for unclear reasons.  I think that all of her  psychiatric medications will probably be best reinitiated by her  psychiatrist at this time.   DIET:  She may have regular a diet.   PHYSICAL ACTIVITY:  No problems with ambulation.  She may move around as  needed and ad lib.   Further disposition to be decided  when the patient is discharged from  the [facility].   TOTAL TIME SPENT AT DISCHARGE:  35 minutes.      Osvaldo Shipper, MD  Electronically Signed     GK/MEDQ  D:  01/05/2007  T:  01/05/2007  Job:  [id number]

## 2011-05-17 NOTE — Op Note (Signed)
NAME:  [name], [name]                          ACCOUNT NO.:  [id number]   MEDICAL RECORD NO.:  [id number]                   PATIENT TYPE:  INP   LOCATION:  A427                                 FACILITY:  [facility]   PHYSICIAN:  Lazaro Arms, M.D.                DATE OF BIRTH:  1966/07/27   DATE OF PROCEDURE:  DATE OF DISCHARGE:  05/15/2004                                 OPERATIVE REPORT   DELIVERY NOTE   [name] is a 45 year old, gravida 2 para 1 with severe pre-eclampsia and  elevated liver enzymes who has progressed slowly through the active phase of  labor.  Began internal expulsive efforts and over a midline episiotomy  delivered a viable female infant at 13 with Apgars of 8 and 9.  Placenta  was normal and intact.  The uterus contracted down nicely.  The baby weighed  7 pounds 7.3 ounces.  Midline episiotomy was repaired without difficulty.  Estimated blood loss for the delivery was 350 cc.  The patient will undergo  postpartum magnesium sulfate prophylaxis.      ___________________________________________                                            Lazaro Arms, M.D.   Loraine Maple  D:  05/25/2004  T:  05/25/2004  Job:  [id number]

## 2011-05-17 NOTE — Discharge Summary (Signed)
NAME:  [name], [name]                          ACCOUNT NO.:  [id number]   MEDICAL RECORD NO.:  [id number]                   PATIENT TYPE:  INP   LOCATION:  A427                                 FACILITY:  [facility]   PHYSICIAN:  Lazaro Arms, M.D.                DATE OF BIRTH:  1966/01/25   DATE OF ADMISSION:  05/11/2004  DATE OF DISCHARGE:  05/15/2004                                 DISCHARGE SUMMARY   DISCHARGE DIAGNOSES:  1. Status post vaginal delivery.  2. Severe preeclampsia.  3. Elevated postpartum blood pressure.   PROCEDURES:  1. Induction of labor.  2. Vaginal delivery.   Please refer to the transcribed history and physical and antepartum chart.  Details are as mentioned in the hospital.   HOSPITAL COURSE:  The patient was admitted, underwent magnesium sulfate  prophylaxis, her labor was induced. She had a vaginal delivery.  Please see  the delivery note for details.  She also had an epidural placed for labor  management.  After delivery she was kept on magnesium sulfate for 36 hours  for seizure prophylaxis.  She was begun on Procardia XL, Maxzide 75/50 and  Altace for elevated blood pressure.  She was given Valium 3 times a day  because of a history of Xanax use and also to increase her seizure  threshold.  She was discharged home, also, with Procardia 10 mg sublingual  to use as needed.  The patient's liver function tests remained elevated.  On  postpartum day #3 the AST was 58, ALT was 63. Her platelet count remained  normal.  The rest of her labs were normal.  She was discharged to home on  postpartum day #4 in good stable condition to follow up in the office at the  end of the week to have her blood pressure checked.  Of note, the infant was  transferred to [facility] because of questionable seizure  activity.  Cord gas in the baby was normal.     ___________________________________________                                         Lazaro Arms,  M.D.   LHE/MEDQ  D:  05/25/2004  T:  05/25/2004  Job:  [id number]

## 2011-05-17 NOTE — H&P (Signed)
NAME:  [name], [name]                          ACCOUNT NO.:  [id number]   MEDICAL RECORD NO.:  [id number]                   PATIENT TYPE:  INP   LOCATION:  A427                                 FACILITY:  [facility]   PHYSICIAN:  Lazaro Arms, M.D.                DATE OF BIRTH:  Jun 27, 1966   DATE OF ADMISSION:  05/11/2004  DATE OF DISCHARGE:                                HISTORY & PHYSICAL   HISTORY OF PRESENT ILLNESS:  [name] is a 45 year old white female, gravida 2,  para 1, abortus 0, with estimated date of delivery of June 04, 2004,  currently at 36-4/7 weeks' gestation, who has class A-1 diabetes.  The  patient had interestingly preeclampsia with her first pregnancy back in 1996  and ended up getting delivered by induction at 34 weeks after premature  rupture of the membranes.  This pregnancy, her blood pressures have been  okay; they have been sort of in the 140/80 range and she came in 2 days ago,  140/90; today, she is 170/100.  She had 2+ protein.  We did a 24-hour urine  which revealed 3600 mg of protein in 24 hours; she is still dipping 2+  today.  As a result of the elevated of the elevated proteinuria and blood  pressure, she is admitted for magnesium sulfate tocolysis and induction of  labor; she will plan to do an epidural.   PAST MEDICAL HISTORY:  1. Migraine headaches.  2. Depression.  3. Panic attacks.   PAST SURGICAL HISTORY:  Foot surgery.   PAST OBSTETRICAL HISTORY:  Past OB is stated as above back in 1996.   MEDICATIONS:  Effexor and Xanax as needed.   REVIEW OF SYSTEMS:  Review of systems is as per HPI, otherwise, negative.   PRENATAL LABORATORY DATA:  Her blood type is A-positive, antibody screen is  negative.  All of her labs are normal.  Her group B strep is pending; I just  do not have the report on the chart and we will track that down.   PHYSICAL EXAMINATION:  HEENT:  Unremarkable.  NECK:  Her thyroid is normal.  LUNGS:  Lungs clear.  BREASTS:   Deferred.  ABDOMEN:  Fundal height was 39 cm.  PELVIC:  Cervix is 1-2, thick ballotable.  She does have polyhydramnios as  well.  EXTREMITIES:  Extremities are warm with 2+ edema.   IMPRESSION:  1. Intrauterine pregnancy at 36-5/7 weeks' gestation.  2. Class A-1 diabetes.  3. Preeclampsia.  4. Polyhydramnios.   PLAN:  The patient is admitted for induction of labor, magnesium sulfate  tocolysis.  We will plan to do an epidural.     ___________________________________________  Lazaro Arms, M.D.   Loraine Maple  D:  05/11/2004  T:  05/11/2004  Job:  [id number]

## 2011-05-17 NOTE — Group Therapy Note (Signed)
NAMESOILA, PRINTUP                ACCOUNT NO.:  [id number]   MEDICAL RECORD NO.:  [id number]          PATIENT TYPE:  INP   LOCATION:  A219                          FACILITY:  [facility]   PHYSICIAN:  Margaretmary Dys, M.D.DATE OF BIRTH:  1966-08-25   DATE OF PROCEDURE:  DATE OF DISCHARGE:                                 PROGRESS NOTE   SUBJECTIVE:  The patient was admitted for a polypharmacy overdose  yesterday.  She was quite drowsy and tachycardic.  Today, her heart rate  has improved, but still is more tachycardic than her baseline.  She is  more awake this morning.   OBJECTIVE:  Conscious, alert, well-oriented in time, place, and person.  The patient requesting to eat a regular diet.  VITAL SIGNS:  Blood pressure 101/64, pulse 133, respirations 18, T-max  97.8, oxygen saturation 99% on room air.  HEENT:  Normocephalic, atraumatic.  Oral mucosa was moist with no  exudate.  NECK:  Supple.  No JVD.  No lymphadenopathy.  LUNGS:  Clear equally with good air entry bilaterally.  HEART:  S1, S2 regular.  No S3, S4 gallops or rubs.  ABDOMEN:  Soft, nontender.  Bowel sounds were positive.  No masses  palpable.  EXTREMITIES:  No edema.   LABORATORY/DIAGNOSTIC DATA:  White blood cell count 7.9, hemoglobin  12.6, hematocrit 37.9, platelet count is 213 with no left shift.  Sodium  138, potassium 4.1, chloride 108, CO2 23, glucose 140, BUN 8, creatinine  0.84.  Cardiac enzymes are negative.  Albumin is 3.1.   ASSESSMENT AND PLAN:  Drug overdose with multiple medications including  Risperdal, Cymbalta, trazodone.  I think the predominant manifestations  from this drug overdose was excessive sedation, and this appears to be  wearing off now.  She did have significant tachycardia in the 150s when  she first came.  She did get some IV fluids.  Heart rate is bouncing  between 110 to 130.  I think we need to observe her for 1 more day.  Hopefully we will get her heart rate down to below 100.  She  told me  that her baseline was always in the 90s anyway.     The patient is aware that she will need to be transferred, as I  discussed with her the legal implications.  We will continue on suicide  watch at this time.  She does have elevated blood sugars.  We will check  the hemoglobin A1C.  The patient might be borderline diabetic, but I do  not see any indication to start therapy at this time.      Margaretmary Dys, M.D.  Electronically Signed     AM/MEDQ  D:  01/04/2007  T:  01/04/2007  Job:  [id number]

## 2011-05-17 NOTE — H&P (Signed)
NAME:  [name], [name]                          ACCOUNT NO.:  [id number]   MEDICAL RECORD NO.:  [id number]                   PATIENT TYPE:  AMB   LOCATION:  DAY                                  FACILITY:  [facility]   PHYSICIAN:  Lazaro Arms, M.D.                DATE OF BIRTH:  09/10/1966   DATE OF ADMISSION:  07/06/2004  DATE OF DISCHARGE:                                HISTORY & PHYSICAL   [name] is a 45 year old white female, gravida 2, para 2, status post a vaginal  delivery on May 11, 2004, who has been having several-year history of  bilateral pelvic pain, and she also has severe debilitating nonresponsive  menstrual migraines which actually caused her to lose a job.  She has been  to the headache center.  She has had multiple interventions prophylactically  and estrogen therapy, and nothing seems to help.  As a result, she is  admitted for a laparoscopic bilateral salpingo-oophorectomy.   PAST MEDICAL HISTORY:  1. Migraine headaches.  2. Depression.  3. Panic attacks.   PAST SURGICAL HISTORY:  Foot surgery.   PAST OBSTETRICAL HISTORY:  Two vaginal deliveries in 1996 and 2005.   MEDICATIONS:  Include Effexor and Valium.   REVIEW OF SYSTEMS:  Otherwise negative.   ALLERGIES:  None.   PHYSICAL EXAMINATION:  HEENT:  Unremarkable.  NECK:  Thyroid is normal.  LUNGS:  Clear.  HEART:  Regular rate and rhythm without murmurs, rubs, or gallops.  BREASTS:  Deferred.  ABDOMEN:  Benign.  PELVIC:  Normal uterus.  Ovaries tender bilaterally.  EXTREMITIES:  No edema.  NEUROLOGIC:  Grossly intact.   IMPRESSION:  1. Bilateral lower pelvic pain.  2. Severe menstrual migraines, unresponsive.  3. Desires sterilization.   PLAN:  [name] is admitted for laparoscopic bilateral salpingo-oophorectomy.  She understands the benefits.  We discussed the multiple options including  TAH-BSO, TVH-BSO, and she wants to proceed with laparoscopic bilateral  salpingo-oophorectomy.     ___________________________________________                                         Lazaro Arms, M.D.   Loraine Maple  D:  07/05/2004  T:  07/05/2004  Job:  [id number]

## 2011-05-17 NOTE — Discharge Summary (Signed)
[name], ESPIRITU NO.:  [id number]   MEDICAL RECORD NO.:  [id number]          PATIENT TYPE:  IPS   LOCATION:  0501                          FACILITY:  [facility]   PHYSICIAN:  Jasmine Pang, M.D. DATE OF BIRTH:  1966/02/13   DATE OF ADMISSION:  09/17/2006  DATE OF DISCHARGE:  09/22/2006                                 DISCHARGE SUMMARY   IDENTIFYING INFORMATION:  This is a 45 year old married Caucasian female who  was admitted on a voluntary basis on September 17, 2006.   HISTORY OF PRESENT ILLNESS:  The patient has a history of depression.  She  stated she had no reason to live.  She has been having financial stress  and cannot provide for her family.  She lost a job due to medical problems.  She feels distant from her 32-year-old daughter.  She also has an 51 year old  son.  She states her husband is supportive but has been having to take off  time in increasing amounts to come home because she does not feel she can  cope with the baby.  She admits to stating that she has felt like she may  hurt herself or the baby.  She states she does not like to take medications.  She has been sleeping too much.  Appetite is okay.  This is the second [facility]  admission for this patient.  She was here at [facility] in February of 2007 with  depression.  She sees Dr. Lolly Mustache in [place], [place].  She has  been on Zoloft, Effexor, Xanax, Paxil, Tofranil and Lamictal in the past.  She is currently on Cymbalta 90 mg daily and Valium 5 mg in the a.m. and 5  mg at h.s.  She is also on Provera and Vivelle patch.  For further admission  information, see psychiatric admission assessment.   PHYSICAL EXAMINATION:  The patient had no acute physical problems.  Her exam  was done by our nurse practitioner.   LABORATORY DATA:  MCV is 77.1, glucose 101, TSH 2.143.   HOSPITAL COURSE:  Upon admission, the patient was started on Ambien 10 mg  p.o. q.h.s. p.r.n. insomnia.  She was also continued  on her Cymbalta 60 mg  daily, Valium 5 mg p.o. b.i.d. and Provera 2.5 mg daily.  The dose of her  Vivelle patch was not known at first.  On September 19, 2006, the patient  was started on a Vivelle patch 1 mg, give 1 now, then change patch every  Tuesday and Friday.  On September 21, 2006, the patient was started on  Risperdal 0.25 mg p.o. q.h.s.  The patient tolerated these medications well  with no significant side effects.   Upon first meeting the patient, she stated she was here for depression.  She  has been depressed for the past 2-3 weeks but has a longstanding history of  depression.  She has a 75-year-old and 22 year old child.  She lives with her  husband but does not feel he is as supportive as he could be.  She states he  does watch the  children for her in the evenings.  She is not sure she wants  a family session because it may make me feel worse.  She states he wants  to know what she has to be depressed about.  She sees Dr. Lolly Mustache and there  is some question of whether she could be bipolar.  On September 19, 2006,  the patient states I'm not thinking about anything.  She was tearful because she was told maybe she should not go home by one of  the counselors here.  She feels we think she will harm her baby.  She talked  about her outpatient counselor.  She wants to see her more often.  She  denies suicidal ideation today.  She talked about her previous suicide  attempt.  On September 20, 2006, the patient was seen by her own outpatient  physician.  She was complaining that her family session did not go well and  she was not very hopeful about her husband.  On September 20, 2006, family  session was held with the patient, Clinical research associate and the patient's husband.  The  patient has little to no support in helping care for her 6-year-old and is  worried she will snap and hurt her.  The patient spoke about not feeling  better on her medications.  She spoke about getting involved in  activities  in her church to help increase support.  She requested help in finding  marriage counseling and currently sees a therapist every two weeks.  She is  isolated from supports in [place].  On September 21, 2006, the patient  states she still felt overwhelmed and complained of racing thoughts.  She  was confused about future goal planning.  She stated her husband was still  not very supportive but she would like to see a marriage counselor for  couples therapy.  Risperdal was started to help stabilize her mood.   On September 22, 2006, the patient's mental status had improved markedly.  She was friendly and cooperative with good eye contact.  Speech normal rate  and flow.  Psychomotor activity within normal limits.  Eye contact good.  Mood was less depressed and anxious.  Affect was wide range.  No suicidal or  homicidal ideation.  No self-injurious behavior.  No auditory or visual  hallucinations.  No delusions or paranoia.  Thoughts logical and goal-  directed.  Thought content no predominant theme.  The patient was felt ready  to go home.  She was somewhat upset when told by the counselor that DSS had  to be called to report the concerns regarding the safety of her 92-year-old  daughter in the home based on the statements the patient had made about  harming self and daughter.  She and the counselor discussed a safety plan.  Patient has plans to get out of the house and be involved in activities as  well as calling family members when stressed.  She also states that she  recognizes signs of her decreased ability to cope and will not hesitate to  readmit herself to the hospital if overwhelmed.  She is aware that the  [facility] may be of help to her in  getting resources for her and her 89-year-old daughter.   DISCHARGE DIAGNOSES:  AXIS I:  Bipolar disorder not otherwise specified.  Anxiety disorder not otherwise specified. AXIS II:  None.   AXIS III:  Migraines.  AXIS IV:  Severe (problems with primary support  group, recent DSS  involvement, other psychosocial problems, economic problem).  AXIS V:  GAF upon discharge 45; GAF upon admission 30; GAF highest past year  65.   ACTIVITY/DIET:  There were no specific activity level or dietary  restrictions.   DISCHARGE MEDICATIONS:  1. Cymbalta 60 mg daily.  2. Diazepam 5 mg twice daily.  3. Provera 2.5 mg twice daily.  4. Risperdal 0.25 mg at bedtime.  5. Vivelle patch 0.1 mg, change every seven days, due on September 26, 2006, no prescription.   POST-HOSPITAL CARE PLANS:  The patient will return to see Dr. Lolly Mustache at the  [facility] on October 02, 2006 at  2:30.  She will also see a counselor on September 23, 2006 at 3 p.m.      Jasmine Pang, M.D.  Electronically Signed     BHS/MEDQ  D:  09/22/2006  T:  09/23/2006  Job:  [id number]

## 2011-08-30 ENCOUNTER — Encounter: Payer: Self-pay | Admitting: Emergency Medicine

## 2011-08-30 ENCOUNTER — Emergency Department (HOSPITAL_COMMUNITY): Payer: Medicare Other

## 2011-08-30 ENCOUNTER — Emergency Department (HOSPITAL_COMMUNITY)
Admission: EM | Admit: 2011-08-30 | Discharge: 2011-08-30 | Disposition: A | Discharge status: Home / Self Care | Payer: Medicare Other | Attending: Emergency Medicine | Admitting: Emergency Medicine

## 2011-08-30 DIAGNOSIS — IMO0002 Reserved for concepts with insufficient information to code with codable children: Secondary | ICD-10-CM | POA: Insufficient documentation

## 2011-08-30 DIAGNOSIS — S8000XA Contusion of unspecified knee, initial encounter: Secondary | ICD-10-CM | POA: Insufficient documentation

## 2011-08-30 DIAGNOSIS — S9030XA Contusion of unspecified foot, initial encounter: Secondary | ICD-10-CM | POA: Insufficient documentation

## 2011-08-30 DIAGNOSIS — S92309A Fracture of unspecified metatarsal bone(s), unspecified foot, initial encounter for closed fracture: Secondary | ICD-10-CM

## 2011-08-30 DIAGNOSIS — Y92009 Unspecified place in unspecified non-institutional (private) residence as the place of occurrence of the external cause: Secondary | ICD-10-CM | POA: Insufficient documentation

## 2011-08-30 DIAGNOSIS — E119 Type 2 diabetes mellitus without complications: Secondary | ICD-10-CM | POA: Insufficient documentation

## 2011-08-30 DIAGNOSIS — W108XXA Fall (on) (from) other stairs and steps, initial encounter: Secondary | ICD-10-CM | POA: Insufficient documentation

## 2011-08-30 DIAGNOSIS — S5010XA Contusion of unspecified forearm, initial encounter: Secondary | ICD-10-CM | POA: Insufficient documentation

## 2011-08-30 DIAGNOSIS — I10 Essential (primary) hypertension: Secondary | ICD-10-CM | POA: Insufficient documentation

## 2011-08-30 HISTORY — DX: Essential (primary) hypertension: I10

## 2011-08-30 HISTORY — DX: Unspecified osteoarthritis, unspecified site: M19.90

## 2011-08-30 MED ORDER — KETOROLAC TROMETHAMINE 60 MG/2ML IM SOLN
60.0000 mg | Freq: Once | INTRAMUSCULAR | Status: AC
  Administered 2011-08-30: 60 mg via INTRAMUSCULAR
  Filled 2011-08-30: qty 2

## 2011-08-30 MED ORDER — BACITRACIN ZINC 500 UNIT/GM EX OINT
TOPICAL_OINTMENT | CUTANEOUS | Status: AC
  Administered 2011-08-30: 1
  Filled 2011-08-30: qty 0.9

## 2011-08-30 MED ORDER — IBUPROFEN 800 MG PO TABS: 800.0000 mg | ORAL_TABLET | Freq: Three times a day (TID) | ORAL | Status: AC

## 2011-08-30 NOTE — ED Provider Notes (Addendum)
History     CSN: [id number] Arrival date & time: 08/30/2011  5:06 PM  Chief Complaint  Patient presents with  . Foot Pain   HPI Comments: Patient had a fall approximately 3 hours prior to arrival when she missed a step on the stairs. She fell approximately 2-3 steps landing on her right knee and left ankle. She has developed constant, moderate pain in both those areas which is worse with moving, ambulation, palpation. There is associated bruising on the top of the left foot in the medial right knee. She denies head injury, neck or back pain.  The history is provided by the patient and a friend.     Past Medical History  Diagnosis Date  . Hypertension   . Diabetes mellitus   . Arthritis     Past Surgical History  Procedure Date  . Foot surgery     No family history on file.  History  Substance Use Topics  . Smoking status: Never Smoker   . Smokeless tobacco: Not on file  . Alcohol Use: No    OB History    Grav Para Term Preterm Abortions TAB SAB Ect Mult Living                  Review of Systems  Gastrointestinal: Negative for nausea and vomiting.  Musculoskeletal: Positive for joint swelling.  Skin:       bruise  Neurological: Negative for weakness and numbness.    Physical Exam  BP 135/78  Pulse 105  Temp(Src) 97.3 F (36.3 C) (Oral)  Resp 18  Ht 5\' 5"  (1.651 m)  Wt 200 lb (90.719 kg)  BMI 33.28 kg/m2  SpO2 97%  Physical Exam  Constitutional: She appears well-developed and well-nourished. No distress.  HENT:  Head: Normocephalic and atraumatic.  Eyes: Conjunctivae and EOM are normal. Pupils are equal, round, and reactive to light.  Neck: Normal range of motion. Neck supple. No tracheal deviation present.  Cardiovascular: Normal rate, regular rhythm and normal heart sounds.   Pulmonary/Chest: Effort normal and breath sounds normal. No respiratory distress. She exhibits no tenderness.  Abdominal: Soft. Bowel sounds are normal. She exhibits no  distension. There is no tenderness. There is no rebound.  Musculoskeletal:       Contusion to the right medial knee with overlying abrasion. Full range of motion of this joint and able to straight leg raising is significant resistance. No pain at the hip or ankle on the right. Bilateral upper trauma he is well-appearing with full range of motion of all joints. She does have mild bruising to the left proximal forearm on the ulnar surface. There is no underlying tenderness. Left lower strumming with tenderness over the lateral malleolus, dorsum of the foot and base of the fifth metatarsal. There is significant bruising over this area.  Neurological:       Sensation intact to both feet. Strength normal  Skin: Skin is warm and dry. She is not diaphoretic.       Bruising as outlined.     ED Course  Procedures  MDM Patient appears to have several injuries one to the right knee and one to the left ankle. X-rays ordered, pain medication ordered. Overall patient appears well intramuscular Toradol ordered.    No acute findings on the imaging except for a slight evulsion fracture at the base of the fifth metatarsal of the left foot. Walking boot given, pain control given, findings the patient. Will discharge home  Vida Roller,  MD 08/30/11 1846  I personally performed the services described in this documentation, which was scribed in my presence. The recorded information has been reviewed and considered. Vida Roller, MD    Vida Roller, MD 08/30/11 [phone]

## 2011-08-30 NOTE — ED Notes (Signed)
Patient with c/o fall and ankle pain. Patient reports she missed a step and fell. Left anterior foot noted to have swelling and bruising noted, PMS intact. Abrasion and bruising noted to right knee. Patient with c/o cramping to left hip.

## 2011-09-05 ENCOUNTER — Inpatient Hospital Stay (HOSPITAL_COMMUNITY)
Admission: RE | Admit: 2011-09-05 | Discharge: 2011-09-09 | DRG: 885 | Disposition: A | Discharge status: Home / Self Care | Payer: Medicare Other | Attending: Psychiatry | Admitting: Psychiatry

## 2011-09-05 DIAGNOSIS — S9030XA Contusion of unspecified foot, initial encounter: Secondary | ICD-10-CM

## 2011-09-05 DIAGNOSIS — F411 Generalized anxiety disorder: Secondary | ICD-10-CM

## 2011-09-05 DIAGNOSIS — G43909 Migraine, unspecified, not intractable, without status migrainosus: Secondary | ICD-10-CM

## 2011-09-05 DIAGNOSIS — I10 Essential (primary) hypertension: Secondary | ICD-10-CM

## 2011-09-05 DIAGNOSIS — F332 Major depressive disorder, recurrent severe without psychotic features: Principal | ICD-10-CM

## 2011-09-05 DIAGNOSIS — R45851 Suicidal ideations: Secondary | ICD-10-CM

## 2011-09-05 DIAGNOSIS — X58XXXA Exposure to other specified factors, initial encounter: Secondary | ICD-10-CM

## 2011-09-05 DIAGNOSIS — Z79899 Other long term (current) drug therapy: Secondary | ICD-10-CM

## 2011-09-05 DIAGNOSIS — E119 Type 2 diabetes mellitus without complications: Secondary | ICD-10-CM

## 2011-09-05 DIAGNOSIS — F410 Panic disorder [episodic paroxysmal anxiety] without agoraphobia: Secondary | ICD-10-CM

## 2011-09-06 DIAGNOSIS — F339 Major depressive disorder, recurrent, unspecified: Secondary | ICD-10-CM

## 2011-09-06 DIAGNOSIS — F411 Generalized anxiety disorder: Secondary | ICD-10-CM

## 2011-09-06 DIAGNOSIS — F4001 Agoraphobia with panic disorder: Secondary | ICD-10-CM

## 2011-09-06 LAB — COMPREHENSIVE METABOLIC PANEL
ALT: 22 U/L (ref 0–35)
AST: 24 U/L (ref 0–37)
Alkaline Phosphatase: 104 U/L (ref 39–117)
BUN: 16 mg/dL (ref 6–23)
CO2: 28 mEq/L (ref 19–32)
Calcium: 9.5 mg/dL (ref 8.4–10.5)
Calcium: 9.7 mg/dL (ref 8.4–10.5)
Creatinine, Ser: 0.75 mg/dL (ref 0.50–1.10)
GFR calc Af Amer: >60 mL/min (ref >60)
GFR calc Af Amer: >60 mL/min (ref >60)
GFR calc non Af Amer: >60 mL/min (ref >60)
Glucose, Bld: 106 mg/dL — ABNORMAL HIGH (ref 70–99)
Glucose, Bld: 147 mg/dL — ABNORMAL HIGH (ref 70–99)
Potassium: 3.9 mEq/L (ref 3.5–5.1)
Sodium: 139 mEq/L (ref 135–145)
Total Protein: 7.1 g/dL (ref 6.0–8.3)

## 2011-09-06 LAB — HEPATIC FUNCTION PANEL
AST: 25 U/L (ref 0–37)
Albumin: 3.4 g/dL — ABNORMAL LOW (ref 3.5–5.2)
Alkaline Phosphatase: 108 U/L (ref 39–117)
Bilirubin, Direct: 0.1 mg/dL (ref 0.0–0.3)
Total Bilirubin: 0.4 mg/dL (ref 0.3–1.2)

## 2011-09-06 LAB — CBC
HCT: 38.6 % (ref 36.0–46.0)
Hemoglobin: 13.3 g/dL (ref 12.0–15.0)
Hemoglobin: 12.8 g/dL (ref 12.0–15.0)
MCH: 26.4 pg (ref 26.0–34.0)
MCH: 26.3 pg (ref 26.0–34.0)
MCHC: 33.2 g/dL (ref 30.0–36.0)
Platelets: 270 10*3/uL (ref 150–400)
RBC: 5.04 MIL/uL (ref 3.87–5.11)
RDW: 13.3 % (ref 11.5–15.5)
WBC: 9.4 10*3/uL (ref 4.0–10.5)

## 2011-09-06 LAB — DIFFERENTIAL
Eosinophils Relative: 1 % (ref 0–5)
Lymphocytes Relative: 27 % (ref 12–46)
Monocytes Absolute: 0.7 10*3/uL (ref 0.1–1.0)
Monocytes Relative: 6 % (ref 3–12)
Neutro Abs: 7.3 10*3/uL (ref 1.7–7.7)

## 2011-09-06 LAB — TSH: TSH: 2.36 u[IU]/mL (ref 0.350–4.500)

## 2011-09-07 LAB — T3, FREE: T3, Free: 3 pg/mL (ref 2.3–4.2)

## 2011-09-10 LAB — GLUCOSE, CAPILLARY: Glucose-Capillary: 142 mg/dL — ABNORMAL HIGH (ref 70–99)

## 2011-09-11 NOTE — Assessment & Plan Note (Signed)
[name], [name] NO.:  [id number]  MEDICAL RECORD NO.:  [id number]  LOCATION:  0501                          FACILITY:  [facility]  PHYSICIAN:  Franchot Gallo, MD     DATE OF BIRTH:  Jul 13, 1966  DATE OF ADMISSION:  09/05/2011 DATE OF DISCHARGE:                      PSYCHIATRIC ADMISSION ASSESSMENT   IDENTIFYING INFORMATION:  This is a 45 year old female voluntarily admitted on September 05, 2011.  HISTORY OF PRESENT ILLNESS:  Patient has been ruminating about ways to kill herself, thinking of overdosing and drowning.  She has a past history of suicide attempts.  She has been depressed and tearful, having financial issues.  Patient also recently fell down the stairs last Friday and sustained a sprain and possible chip fracture to her left foot and ankle.  She was reporting increasing depression and thoughts of "not wanting to be here."  She denied any homicidal ideation or psychotic symptoms.  PAST PSYCHIATRIC HISTORY:  Patient has been here last in February 2012. She sees Dr. Tiburcio Pea at [facility].  SOCIAL HISTORY:  Patient is married.  She has 2 children.  She lives in [place].  FAMILY HISTORY:  None.  ALCOHOL AND DRUG HISTORY:  Denies any alcohol or substance use.  PRIMARY CARE PROVIDER:  Dr. Milford Cage.  MEDICAL PROBLEMS:  Listed as: 1. A history of hypertension. 2. Migraine headaches. 3. A recent sprain, chip fracture to her left foot, multiple     contusions.  MEDICATIONS:  Listed are: 1. Alprazolam 2 mg b.i.d. 2. Estradiol 2 mg daily. 3. Metoprolol 25 mg b.i.d. 4. Prozac 20 mg daily. 5. Zestoretic 20/25 daily. 6. Provera 5 mg daily. 7. Effexor 75 mg daily.  DRUG ALLERGIES:  IT IS NOTED THAT THE PATIENT HAS ACETAMINOPHEN AS AN ALLERGY.  PAST MEDICAL HISTORY:  Significant for: 1. Migraines. 2. Hypertension. 3. Non-insulin-dependent diabetes. 4. Menopause.  REVIEW OF SYSTEMS:  Patient denies any fever, chills.  Positive for insomnia.  No  changes in appetite.  No chest pain, chest pressure.  No shortness of breath.  No nausea, vomiting, diarrhea, or constipation.  A recent injury to left ankle.  The patient fell down stairs, sustained a contusion, abrasions to bilateral feet and left arm.  Positive for headaches.  No seizures.  Positive for depression.  Positive for anxiety.  Her temperature is 97.9, heart rate 80, respirations 16, blood pressure is 117/76. GENERAL APPEARANCE:  This is a middle-aged female in some distress, having some difficulty walking.  Has a Cam boot in place.  Complaining of pain to her left leg. HEAD:  Atraumatic.  EOMs are intact.  Negative lymphadenopathy. CHEST:  Clear.  No wheezes or rales. BREAST:  Exam is deferred. HEART:  Regular rate and rhythm.  No murmurs or gallops. ABDOMEN:  Soft, nontender. PELVIC:  Exam is deferred. GU:  Exam is deferred. EXTREMITIES:  Patient currently has a Cam boot in place on her left leg. There is noted redness and swelling with bruising to the sole of her left foot, as well as bruising on her lower left leg.  Tender to palpation.  She also has bruises to her left forearm, left humerus, and has scabs to her right knee that  seem mildly inflamed NEUROLOGICAL:  Findings are intact and nonfocal.  LABORATORY DATA:  Shows a hemoglobin A1c of 6.2.  Alcohol level less than 11.  TSH of 2.360.  CBC within normal limits.  MENTAL STATUS EXAM:  Patient is alert and awake, casually dressed, fair eye contact.  Her speech is clear, normal pace and tone.  Patient is depressed.  She does seem sad.  Thought processes are coherent.  No evidence of any psychotic symptoms.  She denies any suicidal thoughts at this time.  Cognitive function intact.  Her memory is intact.  Her judgment and insight appear to be good.  AXIS I: 1. Major depressive disorder, recurrent, severe. 2. Generalized anxiety disorder, severe. 3. Panic disorder without agoraphobia. AXIS II:  Deferred. AXIS  III: 1. History of hypertension. 2. Migraines. 3. Non-insulin-dependent diabetes. 4. Menopause. 5. A recent sprain, possible chip fracture to left ankle, with     multiple contusions. AXIS IV:  Medical problems, other psychosocial problems related to chronic mental illness. AXIS V:  Current is 35.  PLAN:  Contact Internal Medicine for recommendations in regard to patient's cellulitis and they recommended Keflex which we will initiate. We also discontinued Thorazine and her Prozac and increased her Effexor. We discontinued her Xanax.  We will place patient on Klonopin for ongoing anxiety.  We also ordered further labs and nursing would need to monitor her left foot for redness, swelling, and fever, keep her left foot elevated, and we will obtain a walker for her to aid with her ambulation.  TENTATIVE LENGTH OF STAY:  At this time, is 4 to 5 days.     Landry Corporal, N.P.   ______________________________ Franchot Gallo, MD    JO/MEDQ  D:  09/06/2011  T:  09/06/2011  Job:  [id number]  Electronically Signed by Limmie PatriciaP. on 09/09/2011 03:24:36 PM Electronically Signed by Franchot Gallo MD on 09/11/2011 04:24:55 PM

## 2011-09-11 NOTE — Discharge Summary (Signed)
[name], [name]                ACCOUNT NO.:  [id number]  MEDICAL RECORD NO.:  [id number]  LOCATION:  0501                          FACILITY:  [facility]  PHYSICIAN:  Franchot Gallo, MD     DATE OF BIRTH:  11-18-1966  DATE OF ADMISSION:  09/05/2011 DATE OF DISCHARGE:  09/09/2011                              DISCHARGE SUMMARY   REASON FOR ADMISSION:  This is a 45 year old female who was ruminating about ways to kill herself, thinking of overdosing or drowning and has a past history of a suicide attempt.  She was reporting increasing impression and thoughts of "not wanting to be here.".  FINAL IMPRESSION:  Axis I:  Major depressive disorder, recurrent, mild; generalized anxiety disorder, mild; panic disorder with agoraphobia. Axis II:  Deferred. Axis III:  History of hypertension, migraines, left fractured leg, non- insulin-dependent diabetes mellitus. Axis IV:  Limited primary support system, chronic mental and physical health issues. Axis V:  Current on discharge is 65.  PERTINENT LABS:  Hemoglobin A1c 6.2.  Alcohol level less than 11.  TSH is 2.360.  CBC within normal limits.  SIGNIFICANT FINDINGS:  The patient was admitted to the adult milieu for safety and stabilization.  We continued with the patient's limitations in regard to her sprain and possible fracture.  The patient was reporting increasing pain in her left foot, and the patient appeared to have cellulitis.  Internal Medicine was contacted and recommended Keflex.  We also discontinued her Thorazine and Prozac and increased her Effexor.  We discontinued his Xanax and put her on Klonopin for ongoing anxiety.  The patient was participating in groups.  We had contact with [name] to address any safety issues and for [place] to provide information about suicide risk factors, intervention and prevention. She was concerned about her infection in her foot.  She was beginning to improve, having some problems with sleep but  reporting that was normal for her, reporting a good appetite, mild depressive symptoms, denying any suicidal or homicidal thoughts and having no medication side effects.  She was having difficulty sleeping in our facility.  She rated her hopelessness a 4 on a scale of 1-10.  On day of discharge, the patient's, again, sleep was good, appetite was good, adamantly denying any suicidal or homicidal thoughts.  DISCHARGE MEDICATIONS:  Included: 1. Keflex 500 mg 1 b.i.d. 2. Klonopin 0.5 mg 1 in the morning and 1 at 2:00 and 2 at bedtime. 3. Effexor 150 mg daily. 4. Estradiol 2 mg daily. 5. Metoprolol 25 mg 1 b.i.d. 6. Provera 5 mg 1/2 tablet every morning. 7. Zestoretic 1 tablet daily.  The patient was to stop taking her Xanax and Prozac.  FOLLOWUP APPOINTMENT:  At the [facility] in [place], phone number [phone], on September 12th between the hours of 8:00 and 11:00.  She had an appointment [name], phone number [phone].  The patient was to call for her therapy appointment, and an appointment was made with Orthopedics with Dr. Hilda Lias on Thursday, September 13th at 1:30.     Landry Corporal, N.P.   ______________________________ Franchot Gallo, MD    JO/MEDQ  D:  09/10/2011  T:  09/10/2011  Job:  [id number]  Electronically Signed by Limmie PatriciaP. on 09/11/2011 09:20:10 AM Electronically Signed by Franchot Gallo MD on 09/11/2011 04:24:57 PM

## 2011-11-26 ENCOUNTER — Other Ambulatory Visit (HOSPITAL_COMMUNITY)
Admission: RE | Admit: 2011-11-26 | Discharge: 2011-11-26 | Disposition: A | Discharge status: Home / Self Care | Payer: Medicare Other | Source: Ambulatory Visit | Attending: Obstetrics & Gynecology | Admitting: Obstetrics & Gynecology

## 2011-11-26 ENCOUNTER — Other Ambulatory Visit: Payer: Self-pay | Admitting: Obstetrics & Gynecology

## 2011-11-26 DIAGNOSIS — Z124 Encounter for screening for malignant neoplasm of cervix: Secondary | ICD-10-CM | POA: Insufficient documentation

## 2011-12-02 ENCOUNTER — Other Ambulatory Visit: Payer: Self-pay | Admitting: Obstetrics & Gynecology

## 2011-12-02 DIAGNOSIS — Z139 Encounter for screening, unspecified: Secondary | ICD-10-CM

## 2011-12-05 ENCOUNTER — Ambulatory Visit (HOSPITAL_COMMUNITY)
Admission: RE | Admit: 2011-12-05 | Discharge: 2011-12-05 | Disposition: A | Discharge status: Home / Self Care | Payer: Medicare Other | Source: Ambulatory Visit | Attending: Obstetrics & Gynecology | Admitting: Obstetrics & Gynecology

## 2011-12-05 DIAGNOSIS — Z1231 Encounter for screening mammogram for malignant neoplasm of breast: Secondary | ICD-10-CM | POA: Insufficient documentation

## 2011-12-05 DIAGNOSIS — Z139 Encounter for screening, unspecified: Secondary | ICD-10-CM

## 2012-02-14 DIAGNOSIS — F4001 Agoraphobia with panic disorder: Secondary | ICD-10-CM | POA: Diagnosis not present

## 2012-03-13 DIAGNOSIS — F4001 Agoraphobia with panic disorder: Secondary | ICD-10-CM | POA: Diagnosis not present

## 2012-03-20 ENCOUNTER — Inpatient Hospital Stay (HOSPITAL_COMMUNITY)
Admission: RE | Admit: 2012-03-20 | Discharge: 2012-03-27 | DRG: 885 | Disposition: A | Discharge status: Home / Self Care | Payer: Medicare Other | Source: Ambulatory Visit | Attending: Psychiatry | Admitting: Psychiatry

## 2012-03-20 ENCOUNTER — Encounter (HOSPITAL_COMMUNITY): Payer: Self-pay | Admitting: Licensed Clinical Social Worker

## 2012-03-20 ENCOUNTER — Emergency Department (HOSPITAL_COMMUNITY)
Admission: EM | Admit: 2012-03-20 | Discharge: 2012-03-20 | Disposition: A | Discharge status: Other Acute Inpatient Hospital | Payer: Medicare Other | Source: Home / Self Care | Attending: Emergency Medicine | Admitting: Emergency Medicine

## 2012-03-20 DIAGNOSIS — R45851 Suicidal ideations: Secondary | ICD-10-CM

## 2012-03-20 DIAGNOSIS — M129 Arthropathy, unspecified: Secondary | ICD-10-CM | POA: Insufficient documentation

## 2012-03-20 DIAGNOSIS — F332 Major depressive disorder, recurrent severe without psychotic features: Principal | ICD-10-CM

## 2012-03-20 DIAGNOSIS — F411 Generalized anxiety disorder: Secondary | ICD-10-CM

## 2012-03-20 DIAGNOSIS — F3289 Other specified depressive episodes: Secondary | ICD-10-CM | POA: Diagnosis not present

## 2012-03-20 DIAGNOSIS — F339 Major depressive disorder, recurrent, unspecified: Secondary | ICD-10-CM | POA: Diagnosis not present

## 2012-03-20 DIAGNOSIS — F329 Major depressive disorder, single episode, unspecified: Secondary | ICD-10-CM

## 2012-03-20 DIAGNOSIS — F4310 Post-traumatic stress disorder, unspecified: Secondary | ICD-10-CM | POA: Diagnosis not present

## 2012-03-20 DIAGNOSIS — Z7982 Long term (current) use of aspirin: Secondary | ICD-10-CM | POA: Diagnosis not present

## 2012-03-20 DIAGNOSIS — Z79899 Other long term (current) drug therapy: Secondary | ICD-10-CM

## 2012-03-20 DIAGNOSIS — F341 Dysthymic disorder: Secondary | ICD-10-CM | POA: Insufficient documentation

## 2012-03-20 DIAGNOSIS — F0781 Postconcussional syndrome: Secondary | ICD-10-CM | POA: Diagnosis not present

## 2012-03-20 DIAGNOSIS — E119 Type 2 diabetes mellitus without complications: Secondary | ICD-10-CM

## 2012-03-20 DIAGNOSIS — R209 Unspecified disturbances of skin sensation: Secondary | ICD-10-CM | POA: Insufficient documentation

## 2012-03-20 DIAGNOSIS — F609 Personality disorder, unspecified: Secondary | ICD-10-CM

## 2012-03-20 DIAGNOSIS — I10 Essential (primary) hypertension: Secondary | ICD-10-CM

## 2012-03-20 HISTORY — DX: Major depressive disorder, single episode, unspecified: F32.9

## 2012-03-20 HISTORY — DX: Depression, unspecified: F32.A

## 2012-03-20 HISTORY — DX: Anxiety disorder, unspecified: F41.9

## 2012-03-20 HISTORY — DX: Headache: R51

## 2012-03-20 LAB — CBC
MCH: 26.5 pg (ref 26.0–34.0)
MCV: 79.7 fL (ref 78.0–100.0)
Platelets: 237 10*3/uL (ref 150–400)
RDW: 13.1 % (ref 11.5–15.5)
WBC: 10.4 10*3/uL (ref 4.0–10.5)

## 2012-03-20 LAB — COMPREHENSIVE METABOLIC PANEL
AST: 26 U/L (ref 0–37)
Albumin: 4 g/dL (ref 3.5–5.2)
Calcium: 9.5 mg/dL (ref 8.4–10.5)
Chloride: 98 mEq/L (ref 96–112)
Creatinine, Ser: 0.83 mg/dL (ref 0.50–1.10)
Total Protein: 7.7 g/dL (ref 6.0–8.3)

## 2012-03-20 LAB — RAPID URINE DRUG SCREEN, HOSP PERFORMED
Amphetamines: NOT DETECTED
Benzodiazepines: POSITIVE — AB
Cocaine: NOT DETECTED
Opiates: NOT DETECTED
Tetrahydrocannabinol: NOT DETECTED

## 2012-03-20 MED ORDER — LISINOPRIL 20 MG PO TABS
20.0000 mg | ORAL_TABLET | Freq: Every day | ORAL | Status: DC
  Administered 2012-03-21 – 2012-03-27 (×7): 20 mg via ORAL
  Filled 2012-03-20 (×10): qty 1

## 2012-03-20 MED ORDER — CLONAZEPAM 1 MG PO TABS
1.0000 mg | ORAL_TABLET | Freq: Once | ORAL | Status: AC
  Administered 2012-03-20: 1 mg via ORAL
  Filled 2012-03-20: qty 1

## 2012-03-20 MED ORDER — HYDROCHLOROTHIAZIDE 12.5 MG PO CAPS
25.0000 mg | ORAL_CAPSULE | Freq: Every day | ORAL | Status: DC
  Administered 2012-03-21 – 2012-03-27 (×7): 25 mg via ORAL
  Filled 2012-03-20 (×9): qty 2

## 2012-03-20 MED ORDER — METOPROLOL TARTRATE 25 MG PO TABS
25.0000 mg | ORAL_TABLET | Freq: Two times a day (BID) | ORAL | Status: DC
  Administered 2012-03-20 – 2012-03-25 (×10): 25 mg via ORAL
  Filled 2012-03-20 (×16): qty 1

## 2012-03-20 MED ORDER — ACETAMINOPHEN 325 MG PO TABS: 650.0000 mg | ORAL_TABLET | Freq: Four times a day (QID) | ORAL | Status: DC | PRN

## 2012-03-20 MED ORDER — TRAZODONE HCL 50 MG PO TABS
50.0000 mg | ORAL_TABLET | Freq: Every evening | ORAL | Status: DC | PRN
  Administered 2012-03-21: 50 mg via ORAL
  Filled 2012-03-20 (×11): qty 1

## 2012-03-20 MED ORDER — ALUM & MAG HYDROXIDE-SIMETH 200-200-20 MG/5ML PO SUSP: 30.0000 mL | ORAL | Status: DC | PRN

## 2012-03-20 MED ORDER — MAGNESIUM HYDROXIDE 400 MG/5ML PO SUSP: 30.0000 mL | Freq: Every day | ORAL | Status: DC | PRN

## 2012-03-20 NOTE — Progress Notes (Signed)
Patient ID: [name], female   DOB: 11/30/1966, 46 y.o.   MRN: [id number] Pt. Is a 46 y.o. Female admitted with increased depression, anxiety and suicidal ideations. Pt. Is mother of 2 children and reports that father of the son has taken him out of the home to live with him because she took out child support on him. Pt. Reports 7 y.o. Daughter as well as herself misses her son although he visit every other weekend. Pt. Says "I just don't want to live", but contracts for safety "I mean what can I do here with y'all watching everything." Pt. Reports she has been a pt. At [facility] twice last year in June and November but can't remember dates. Pt. Has medical hx. Of HTN, NIDDM, and Arthritis. Pt. Appears to be preoccupied with family issues during the admission. Pt. Also complains of agoraphobia ("I havent been to [facility] in about a year, I have a panic attack every time I come.") Pt. Given something to eat/drink. Pt. Will be monitored q57min for safety.

## 2012-03-20 NOTE — Tx Team (Signed)
Initial Interdisciplinary Treatment Plan  PATIENT STRENGTHS: (choose at least two) Ability for insight Average or above average intelligence Capable of independent living Communication skills Financial means General fund of knowledge  PATIENT STRESSORS: Educational concerns Health problems Medication change or noncompliance Traumatic event   PROBLEM LIST: Problem List/Patient Goals Date to be addressed Date deferred Reason deferred Estimated date of resolution  Depression 03-20-12     Suicidal Ideations 03-20-12     HTN 03-20-12     NIDDM 03-20-12                                     DISCHARGE CRITERIA:  Ability to meet basic life and health needs Adequate post-discharge living arrangements Improved stabilization in mood, thinking, and/or behavior Motivation to continue treatment in a less acute level of care Need for constant or close observation no longer present  PRELIMINARY DISCHARGE PLAN: Attend aftercare/continuing care group Outpatient therapy Participate in family therapy  PATIENT/FAMIILY INVOLVEMENT: This treatment plan has been presented to and reviewed with the patient, [name], and/or family member.  The patient and family have been given the opportunity to ask questions and make suggestions.  [name] 03/20/2012, 8:15 PM

## 2012-03-20 NOTE — Discharge Instructions (Signed)
GO STRAIGHT TO [facility] NOW TO BE ADMITTED.

## 2012-03-20 NOTE — ED Notes (Signed)
Pt states that she is depressed, anxious, and tearful and has been for around 2 months. She states she doesn't want to live anymore and she was going to take pills. States she has these thoughts and usually comes in here to get help.

## 2012-03-20 NOTE — ED Notes (Signed)
Informed by [facility] Assessment accepted to [facility]: [facility] to [facility] ([phone]). Had pt sign voluntary admission. Faxed to [facility]. Updated EDP & RN. Pt to be transported via security.
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
## 2012-03-20 NOTE — ED Provider Notes (Signed)
History     CSN: [id number]  Arrival date & time 03/20/12  1518   First MD Initiated Contact with Patient 03/20/12 1703      Chief Complaint  Patient presents with  . Medical Clearance    (Consider location/radiation/quality/duration/timing/severity/associated sxs/prior treatment) HPI  Patient relates she feels "numb". She has a long history of depression. She relates she was last admitted to psychiatric hospital in September and felt better for a little while. However she states after Christmas she started feeling worse. She states she has started having suicidal ideation and she has overdosed in the past "plenty of times" which she relates as 5 or 6 times. She relates her sister died about 2 years ago from ovarian cancer and a 10 month period. Her other sister was recently diagnosed with nonalcoholic cirrhosis and she is starting to have medical complications and patient states that she cannot go through this again. She is followed by Dr. Tiburcio Pea psychiatrist in [place] her to increase her her Lamictal from 25 mg a day to 50 mg a day today however she has not done  that.  Prosac not helped with her depression.  She relates she signed a no harm contract about 2 weeks ago however today she called her nurse contact at behavioral health and stated she was feeling worse. She was advised to come in for admission. She denies overdose at this time.   Past Medical History  Diagnosis Date  . Hypertension   . Diabetes mellitus   . Arthritis   . Depression   . Anxiety   . Headache     Past Surgical History  Procedure Date  . Foot surgery     No family history on file.  History  Substance Use Topics  . Smoking status: Never Smoker   . Smokeless tobacco: Not on file  . Alcohol Use: No  Separated On disability for depression Lives with 51 yo daughter  OB History    Grav Para Term Preterm Abortions TAB SAB Ect Mult Living                  Review of Systems  All other systems  reviewed and are negative.    Allergies  Review of patient's allergies indicates no known allergies.  Home Medications   Current Outpatient Rx  Name Route Sig Dispense Refill  . ALPRAZOLAM 2 MG PO TABS Oral Take 2 mg by mouth daily as needed. For panic disorder     . CLONAZEPAM 1 MG PO TABS Oral Take 1 mg by mouth 3 (three) times daily as needed. panic    . LAMOTRIGINE 25 MG PO TABS Oral Take 50 mg by mouth daily.    Marland Kitchen LISINOPRIL-HYDROCHLOROTHIAZIDE 20-25 MG PO TABS Oral Take 1 tablet by mouth daily.      Marland Kitchen METOPROLOL TARTRATE 25 MG PO TABS Oral Take 25 mg by mouth 2 (two) times daily.      . TRAZODONE HCL 50 MG PO TABS Oral Take 50 mg by mouth at bedtime.    . VENLAFAXINE HCL 75 MG PO TABS Oral Take 225 mg by mouth daily.      BP 126/78  Pulse 99  Temp(Src) 98.7 F (37.1 C) (Oral)  Resp 18  SpO2 96%  Vital signs normal    Physical Exam  Nursing note and vitals reviewed. Constitutional: She is oriented to person, place, and time. She appears well-developed and well-nourished.  Non-toxic appearance. She does not appear ill. No distress.  HENT:  Head: Normocephalic and atraumatic.  Right Ear: External ear normal.  Left Ear: External ear normal.  Nose: Nose normal. No mucosal edema or rhinorrhea.  Mouth/Throat: Oropharynx is clear and moist and mucous membranes are normal. No dental abscesses or uvula swelling.  Eyes: Conjunctivae and EOM are normal. Pupils are equal, round, and reactive to light.  Neck: Normal range of motion and full passive range of motion without pain. Neck supple.  Cardiovascular: Normal rate, regular rhythm and normal heart sounds.  Exam reveals no gallop and no friction rub.   No murmur heard. Pulmonary/Chest: Effort normal and breath sounds normal. No respiratory distress. She has no wheezes. She has no rhonchi. She has no rales. She exhibits no tenderness and no crepitus.  Abdominal: Soft. Normal appearance and bowel sounds are normal. She exhibits  no distension. There is no tenderness. There is no rebound and no guarding.  Musculoskeletal: Normal range of motion. She exhibits no edema and no tenderness.       Moves all extremities well.   Neurological: She is alert and oriented to person, place, and time. She has normal strength. No cranial nerve deficit.  Skin: Skin is warm, dry and intact. No rash noted. No erythema. No pallor.  Psychiatric: Her speech is normal and behavior is normal. Her mood appears not anxious.       Affect is very flat    ED Course  Procedures (including critical care time)  Pt has been accepted at [facility] by NP Serena Colonel  Results for orders placed during the hospital encounter of 03/20/12  CBC      Component Value Range   WBC 10.4  4.0 - 10.5 (K/uL)   RBC 5.47 (*) 3.87 - 5.11 (MIL/uL)   Hemoglobin 14.5  12.0 - 15.0 (g/dL)   HCT 16.1  09.6 - 04.5 (%)   MCV 79.7  78.0 - 100.0 (fL)   MCH 26.5  26.0 - 34.0 (pg)   MCHC 33.3  30.0 - 36.0 (g/dL)   RDW 40.9  81.1 - 91.4 (%)   Platelets 237  150 - 400 (K/uL)  COMPREHENSIVE METABOLIC PANEL      Component Value Range   Sodium 134 (*) 135 - 145 (mEq/L)   Potassium 4.1  3.5 - 5.1 (mEq/L)   Chloride 98  96 - 112 (mEq/L)   CO2 24  19 - 32 (mEq/L)   Glucose, Bld 100 (*) 70 - 99 (mg/dL)   BUN 16  6 - 23 (mg/dL)   Creatinine, Ser 7.82  0.50 - 1.10 (mg/dL)   Calcium 9.5  8.4 - 95.6 (mg/dL)   Total Protein 7.7  6.0 - 8.3 (g/dL)   Albumin 4.0  3.5 - 5.2 (g/dL)   AST 26  0 - 37 (U/L)   ALT 22  0 - 35 (U/L)   Alkaline Phosphatase 104  39 - 117 (U/L)   Total Bilirubin 0.2 (*) 0.3 - 1.2 (mg/dL)   GFR calc non Af Amer 83 (*) >90 (mL/min)   GFR calc Af Amer >90  >90 (mL/min)  ETHANOL      Component Value Range   Alcohol, Ethyl (B) <11  0 - 11 (mg/dL)  URINE RAPID DRUG SCREEN (HOSP PERFORMED)      Component Value Range   Opiates NONE DETECTED  NONE DETECTED    Cocaine NONE DETECTED  NONE DETECTED    Benzodiazepines POSITIVE (*) NONE DETECTED    Amphetamines NONE  DETECTED  NONE DETECTED  Tetrahydrocannabinol NONE DETECTED  NONE DETECTED    Barbiturates NONE DETECTED  NONE DETECTED   PREGNANCY, URINE      Component Value Range   Preg Test, Ur NEGATIVE  NEGATIVE    Laboratory interpretation all normal except      1. Depression   2. Suicidal ideation    Plan admission to [facility]  Devoria Albe, MD, FACEP    MDM          Ward Givens, MD 03/20/12 [phone]

## 2012-03-20 NOTE — ED Notes (Signed)
Pt wanded by security along with personal belongings. Pt has two belonging bags behind triage desk.

## 2012-03-21 DIAGNOSIS — F339 Major depressive disorder, recurrent, unspecified: Secondary | ICD-10-CM | POA: Diagnosis present

## 2012-03-21 DIAGNOSIS — R45851 Suicidal ideations: Secondary | ICD-10-CM

## 2012-03-21 DIAGNOSIS — F332 Major depressive disorder, recurrent severe without psychotic features: Principal | ICD-10-CM

## 2012-03-21 MED ORDER — LAMOTRIGINE 25 MG PO TABS
50.0000 mg | ORAL_TABLET | Freq: Every day | ORAL | Status: DC
  Administered 2012-03-21 – 2012-03-25 (×5): 50 mg via ORAL
  Filled 2012-03-21 (×8): qty 2

## 2012-03-21 MED ORDER — ASPIRIN-ACETAMINOPHEN-CAFFEINE 250-250-65 MG PO TABS
2.0000 | ORAL_TABLET | Freq: Four times a day (QID) | ORAL | Status: DC | PRN
  Administered 2012-03-21 – 2012-03-27 (×4): 2 via ORAL
  Filled 2012-03-21 (×2): qty 2

## 2012-03-21 MED ORDER — FLUOXETINE HCL 20 MG PO CAPS
40.0000 mg | ORAL_CAPSULE | Freq: Every day | ORAL | Status: DC
  Administered 2012-03-23: 40 mg via ORAL
  Filled 2012-03-21 (×5): qty 2

## 2012-03-21 MED ORDER — METOPROLOL TARTRATE 25 MG PO TABS: 25.0000 mg | ORAL_TABLET | Freq: Two times a day (BID) | ORAL | Status: DC

## 2012-03-21 MED ORDER — FLUOXETINE HCL 40 MG PO CAPS: 40.0000 mg | ORAL_CAPSULE | Freq: Every day | ORAL | Status: DC

## 2012-03-21 MED ORDER — TRAZODONE HCL 50 MG PO TABS
50.0000 mg | ORAL_TABLET | Freq: Every day | ORAL | Status: DC
  Filled 2012-03-21 (×8): qty 1

## 2012-03-21 MED ORDER — LISINOPRIL-HYDROCHLOROTHIAZIDE 20-25 MG PO TABS: 1.0000 | ORAL_TABLET | Freq: Every day | ORAL | Status: DC

## 2012-03-21 MED ORDER — CLONAZEPAM 1 MG PO TABS
1.0000 mg | ORAL_TABLET | Freq: Three times a day (TID) | ORAL | Status: DC
  Administered 2012-03-21 – 2012-03-23 (×7): 1 mg via ORAL
  Filled 2012-03-21 (×8): qty 1

## 2012-03-21 MED ORDER — ALPRAZOLAM 1 MG PO TABS: 2.0000 mg | ORAL_TABLET | Freq: Two times a day (BID) | ORAL | Status: DC | PRN

## 2012-03-21 MED ORDER — VENLAFAXINE HCL 75 MG PO TABS
225.0000 mg | ORAL_TABLET | Freq: Every day | ORAL | Status: DC
  Administered 2012-03-21 – 2012-03-27 (×7): 225 mg via ORAL
  Filled 2012-03-21: qty 6
  Filled 2012-03-21 (×9): qty 3

## 2012-03-21 MED ORDER — ARIPIPRAZOLE 5 MG PO TABS
5.0000 mg | ORAL_TABLET | Freq: Every day | ORAL | Status: DC
  Administered 2012-03-23: 5 mg via ORAL
  Filled 2012-03-21 (×4): qty 1

## 2012-03-21 NOTE — Progress Notes (Signed)
Patient in bed awake at about 0210 am charting with her new room mate. Pt reported that feeling worried about tomorrow. Writer encouraged patient not to worry about tomorrow because it's not here yet. Writer offered a repeat dose of Trazodone . She refused it and stated she is worried she would not be able to get up in the morning if she took the repeat dose. She appeared flat and depressed. Seemed very hopeless and helpless. Q 15 minute check continues to maintain safety.

## 2012-03-21 NOTE — Progress Notes (Signed)
[facility] Adult Inpatient Family/Significant Other Suicide Prevention Education  Suicide Prevention Education:  Education Completed; Hadyn More (mother) [phone],  (name of family member/significant other) has been identified by the patient as the family member/significant other with whom the patient will be residing, and identified as the person(s) who will aid the patient in the event of a mental health crisis (suicidal ideations/suicide attempt).  With written consent from the patient, the family member/significant other has been provided the following suicide prevention education, prior to the and/or following the discharge of the patient.  The suicide prevention education provided includes the following:  Suicide risk factors  Suicide prevention and interventions  National Suicide Hotline telephone number  [facility] assessment telephone number  [facility] Emergency Assistance 911  [facility] and/or Residential Mobile Crisis Unit telephone number  Request made of family/significant other to:  Remove weapons (e.g., guns, rifles, knives), all items previously/currently identified as safety concern.    Remove drugs/medications (over-the-counter, prescriptions, illicit drugs), all items previously/currently identified as a safety concern.  The family member/significant other verbalizes understanding of the suicide prevention education information provided.  The family member/significant other agrees to remove the items of safety concern listed above.  Pt. accepted information on suicide prevention, warning signs to look for with suicide and crisis line numbers to use. The pt. agreed to call crisis line numbers if having warning signs or having thoughts of suicide.  Pt.'s mother reports pt sleeps all day and states she does not want to do anything.    [facility] 03/21/2012, 2:49 PM

## 2012-03-21 NOTE — Progress Notes (Signed)
[facility] Group Notes:  (Counselor/Nursing/MHT/Case Management/Adjunct)  03/21/2012 2:36 PM  Type of Therapy:  Group Therapy  Participation Level:  Did Not Attend    Neila Gear 03/21/2012, 2:36 PM
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
## 2012-03-21 NOTE — Progress Notes (Signed)
[facility] Group Notes:  (Counselor/Nursing/MHT/Case Management/Adjunct)  03/21/2012 10:25 AM  Type of Therapy:  After care Planning group  Pt. attended after care planning group and was given [place] Suicide Prevention  Information and crisis and hotline numbers to call if needed. Pt.'s in the group agreed to use and call them if needed. The pt. states her energy level is at a 2. Pt,. States she just go her last night and did not sleep well last night. Pt. States she came to [facility] for depression and SI thoughts. Pt. Sates she has SI thoughts at times but contracted for safety. Pt. denied HI.  Lamar Blinks Wells 03/21/2012, 10:25 AM

## 2012-03-21 NOTE — H&P (Signed)
Psychiatric Admission Assessment Adult  Patient Identification:  [name] Date of Evaluation:  03/21/2012 46yo SWF CC:  Wants symptoms to go away.  History of Present Illness: Last here Sept 6-10 2012 for same. Has had many admissions since age 56. Followed outpatient by [name].Has been stressed by her sister being diagnosed with nonalcoholic cirrhosis. Another sister died 2 years ago after a 10 month bout with ovarian cancer-patient cannot go through this again. Asks for Excedrin migraine.    Past Psychiatric History: Numerous inpatient admissions to [facility] here etc. No ECT - is afraid it would make memory worse and she would have to come off Klonopin.   Substance Abuse History: No history  Social History:    reports that she has never smoked. She does not have any smokeless tobacco history on file. She reports that she does not drink alcohol or use illicit drugs. Disability for the past 3 years.  Family Psych History:  Past Medical History:     Past Medical History  Diagnosis Date  . Hypertension   . Diabetes mellitus   . Arthritis   . Depression   . Anxiety   . Headache        Past Surgical History  Procedure Date  . Foot surgery     Allergies: No Known Allergies  Current Medications:  Prior to Admission medications   Medication Sig Start Date End Date Taking? Authorizing Provider  lisinopril-hydrochlorothiazide (PRINZIDE,ZESTORETIC) 20-25 MG per tablet Take 1 tablet by mouth daily.     Yes Historical Provider, MD  metoprolol tartrate (LOPRESSOR) 25 MG tablet Take 25 mg by mouth 2 (two) times daily.     Yes Historical Provider, MD  traZODone (DESYREL) 50 MG tablet Take 50 mg by mouth at bedtime.   Yes Historical Provider, MD  venlafaxine (EFFEXOR) 75 MG tablet Take 225 mg by mouth daily.   Yes Historical Provider, MD  alprazolam Prudy Feeler) 2 MG tablet Take 2 mg by mouth daily as needed. For panic disorder     Historical Provider,  MD  clonazePAM (KLONOPIN) 1 MG tablet Take 1 mg by mouth 3 (three) times daily as needed. panic    Historical Provider, MD  lamoTRIgine (LAMICTAL) 25 MG tablet Take 50 mg by mouth daily.    Historical Provider, MD    Mental Status Examination/Evaluation: Objective:  Appearance: Neat  Psychomotor Activity:  Normal  Eye Contact::  Good  Speech:  Slow  Volume:  Normal  Mood: anxious -depressed    Affect:  Restricted  Thought Process: somewhat clear rational goal oriented    Orientation:  Full  Thought Content:  Rumination keeps thinking about sister who died   Suicidal Thoughts:  Yes.  without intent/planhas attempted in past   Homicidal Thoughts:  No  Judgement:  Impaired  Insight:  Shallow    DIAGNOSIS:    AXIS I Major Depression, Recurrent severe  AXIS II Personality Disorder NOS  AXIS III See medical history.  AXIS IV problems related to social environment and problems with primary support group  AXIS V 41-50 serious symptoms     Treatment Plan Summary: Admit for safety & stabilization Adjust meds as indicated  Has outside prescriber and therapist.

## 2012-03-21 NOTE — Progress Notes (Addendum)
03/21/2012 Nursing 1400 [name] is sad, depressed and tearful today. She has refused to attend her groups and states she just " can't deal with it anymore...that she's ' got to find somebody to take care of my little girl'... A She is gvien 2 excedrin migraines around 1330 for complaints of a migraine headache " 10 out of 10". She completed her self inventory and on it she wrote she is positive for SI...she rated her depression and hopelessnesss " 8 / 10 "and did not disclose her DC plans. R Safety is maintained and POC includes continuing to offer encouragement and set limits as pt learns to process and handle her emotions and her problems. PD RN [facility] 03/21/2012 Annendum [name] has had a hard time today...trying to deal with her feelings of sadness, worry and anxiety ( about her future and her sister's illness). She has come to staff on multiple occassions asking for help and has processed her feelings...with much help and  Encouragement. PD RN [facility]
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
## 2012-03-22 NOTE — Progress Notes (Signed)
Report received from [facility] fund[facility]. Writer entered patients room and patient was lying in bed awake, voiced no complaints, when asked if needing anything patient reported she was fine. Patient reports that Trazadone did not work for her on her last visit. Encouraged to try and get some rest. Safety maintained on unit, will continue to monitor.

## 2012-03-22 NOTE — Progress Notes (Signed)
Patient ID: [name], female   DOB: 1966/12/21, 46 y.o.   MRN: [id number]  Pt. attended and participated in aftercare planning group. Pt denied H/I but admitted to having some S/I. Case manager verbally contracted with pt for safety. Pt. listed their current anxiety level as 3 and depression as a 5. Pt has no other case management needs at this time.

## 2012-03-22 NOTE — Progress Notes (Signed)
  [name] is a 46 y.o. female [id number] [birth year]  03/20/2012 Principal Problem:  *Major depressive disorder, recurrent Active Problems:  Passive suicidal ideations   Mental Status: Alert & oriented still has passive SI. Mood and affect depressed.     Subjective/Objective: Asks again about ECT and told her I would give her a handout about it and TCM.     Filed Vitals:   03/22/12 0611  BP: 128/85  Pulse: 92  Temp:   Resp:     Lab Results:   BMET    Component Value Date/Time   NA 134* 03/20/2012 1650   K 4.1 03/20/2012 1650   CL 98 03/20/2012 1650   CO2 24 03/20/2012 1650   GLUCOSE 100* 03/20/2012 1650   BUN 16 03/20/2012 1650   CREATININE 0.83 03/20/2012 1650   CALCIUM 9.5 03/20/2012 1650   GFRNONAA 83* 03/20/2012 1650   GFRAA >90 03/20/2012 1650    Medications:  Scheduled:     . ARIPiprazole  5 mg Oral Daily  . clonazePAM  1 mg Oral TID  . FLUoxetine  40 mg Oral Daily  . hydrochlorothiazide  25 mg Oral Daily  . lamoTRIgine  50 mg Oral Daily  . lisinopril  20 mg Oral Daily  . metoprolol tartrate  25 mg Oral BID  . traZODone  50 mg Oral QHS,MR X 1  . traZODone  50 mg Oral QHS  . venlafaxine  225 mg Oral Daily  . DISCONTD: FLUoxetine  40 mg Oral Daily  . DISCONTD: lisinopril-hydrochlorothiazide  1 tablet Oral Daily  . DISCONTD: metoprolol tartrate  25 mg Oral BID     PRN Meds acetaminophen, ALPRAZolam, alum & mag hydroxide-simeth, aspirin-acetaminophen-caffeine, magnesium hydroxide, DISCONTD: alprazolam  Plan: continue current plan of care.  Dorianna Mckiver,MICKIE D. 03/22/2012

## 2012-03-22 NOTE — Progress Notes (Addendum)
03/22/2012  Nursing D 1500 D Debora cont to stuggle with dealing with her labile emotions. She is compliant with her meds. She attends her groups . A She writes on her self inventory she has " off and on" SI, she rates her depression and hopelessness " 5 / 5 " and states her DC plan is to " do DBT, try to see myself through my daughter's eyes". R Safety is maintained and POC includes fostering therapeutic relationship already establsiehd PD RN [facility]

## 2012-03-22 NOTE — Progress Notes (Signed)
[facility] Group Notes:  (Counselor/Nursing/MHT/Case Management/Adjunct)  03/22/2012 3:44 PM  Type of Therapy:  Group Therapy  Participation Level:  Active  Participation Quality:  Appropriate, Attentive, Sharing and Supportive  Affect:  Appropriate  Cognitive:  Appropriate  Insight:  Good  Engagement in Group:  Good  Engagement in Therapy:  Good  Modes of Intervention:  Clarification, Socialization and Support  Summary of Progress/Problems: Pt. participated in a support group on supports  and how to find supports when they are not available in their life. The  group discussed healthy and unhealthy support and were encouraged by therapist leading the group to seek healthy and multiple forms of support that can aid in their recovery. Pt. spoke about her sister who is deceased having many of the qualities in handout on "what is wanted from supporters". Pt. Was redirected and asked how do we find supports when out supports are not around anymore.  Lamar Blinks [place] 03/22/2012, 3:44 PM

## 2012-03-23 DIAGNOSIS — F339 Major depressive disorder, recurrent, unspecified: Secondary | ICD-10-CM

## 2012-03-23 MED ORDER — ESTRADIOL 2 MG PO TABS
2.0000 mg | ORAL_TABLET | Freq: Every day | ORAL | Status: DC
  Administered 2012-03-23 – 2012-03-27 (×5): 2 mg via ORAL
  Filled 2012-03-23 (×8): qty 1

## 2012-03-23 MED ORDER — ZIPRASIDONE HCL 20 MG PO CAPS
20.0000 mg | ORAL_CAPSULE | Freq: Two times a day (BID) | ORAL | Status: DC
  Administered 2012-03-23 – 2012-03-27 (×8): 20 mg via ORAL
  Filled 2012-03-23 (×11): qty 1

## 2012-03-23 MED ORDER — CLONAZEPAM 1 MG PO TABS
1.0000 mg | ORAL_TABLET | Freq: Two times a day (BID) | ORAL | Status: DC | PRN
  Administered 2012-03-23 – 2012-03-25 (×4): 1 mg via ORAL
  Administered 2012-03-27: 0.5 mg via ORAL
  Filled 2012-03-23 (×5): qty 1

## 2012-03-23 MED ORDER — MEDROXYPROGESTERONE ACETATE 2.5 MG PO TABS
2.5000 mg | ORAL_TABLET | Freq: Every day | ORAL | Status: DC
  Administered 2012-03-23 – 2012-03-27 (×5): 2.5 mg via ORAL
  Filled 2012-03-23 (×8): qty 1

## 2012-03-23 MED ORDER — CARBAMAZEPINE 200 MG PO TABS
200.0000 mg | ORAL_TABLET | Freq: Two times a day (BID) | ORAL | Status: DC
  Administered 2012-03-23 – 2012-03-24 (×2): 200 mg via ORAL
  Filled 2012-03-23 (×3): qty 1

## 2012-03-23 NOTE — Progress Notes (Signed)
[facility] Group Notes: (Counselor/Nursing/MHT/Case Management/Adjunct) 03/23/2012   @1 :15pm  Type of Therapy:  Group Therapy  Participation Level:  Active  Participation Quality: Attentive, Sharing    Affect:  Anxious  Cognitive:  Appropriate  Insight:  Limited  Engagement in Group: Good  Engagement in Therapy:  Good  Modes of Intervention:  Support and Exploration  Summary of Progress/Problems: Jetty was attentive and explored the concept of vulnerability factors for responding negatively to others. She identified going through a divorce and separation as the factor that makes her most vulnerable. She also processed the concept of a behavior being justified, even when it has a negative consequence, if she believes in the principle. For example, she agreed with others that protecting her children is one value that she would feel justified in acting on, no matter the consequence.   [name] 03/23/2012 3:38 PM

## 2012-03-23 NOTE — Progress Notes (Signed)
Pt. Attending the groups today. Continues to present with poor boundaries and limited insight into her issues. Given support and appropriate limits set with Pt.

## 2012-03-23 NOTE — Progress Notes (Signed)
[facility] Group Notes:  (Counselor/Nursing/MHT/Case Management/Adjunct)    Type of Therapy:  Group Therapy  Participation Level:  Did Not Attend     [name] 03/23/2012  12:58 PM

## 2012-03-23 NOTE — Progress Notes (Signed)
Patient ID: [name], female   DOB: Nov 17, 1966, 46 y.o.   MRN: [id number] Pt. Reports depression as "6" out of 10.  Also reports off and on thoughts of suicide but contracts for safety. Pt. Interacts on the unit and attends group.  Staff will monitor q68min for safety. Pt. Is safe on the unit.

## 2012-03-23 NOTE — Progress Notes (Signed)
Recreation Therapy Group Note  Date: 03/23/2012        Time: 1145       Group Topic/Focus: Patient invited to participate in animal assisted therapy. Pets as a coping skill and responsibility were discussed.   Participation Level: Active  Participation Quality: Appropriate and Attentive  Affect: Appropriate  Cognitive: Appropriate and Oriented   Additional Comments: None   

## 2012-03-23 NOTE — Progress Notes (Signed)
Pt did not attend d/c planning group on this date.  SW met with pt individually at this time.  Pt presents with flat affect and depressed mood.  Pt was open with sharing reason for entering the hospital.  Pt states that she's had SI for the past 2 months but has not acted on it.  Pt states that she called for help and came to the hospital.  Pt states that nothing has changed at home, just normal stress that she has a hard time dealing with.  Pt states that she has been diagnosed with bipolar disorder and borderline personality disorder and believes she is more borderline then bipolar, based on her symptoms.  Pt states that she lives with her daughter in [place] and will return home.  Pt states that she drove herself here so she will have transportation home.  Pt states that she sees Dr. Tiburcio Pea at [facility] in [place] and will follow up there for medication management.  Pt states that she doesn't have a therapist but would be open to a referral. SW will secure pt's follow up.  No further needs voiced by pt at this time.   [name], LCSWA 03/23/2012  3:16 PM    Per State Regulation 482.30 This Chart was reviewed for medical necessity with respect to the patient's Admission/Duration of stay.   [name]  03/23/2012  Next Review Date:  03/25/12

## 2012-03-23 NOTE — Progress Notes (Addendum)
[facility] MD Progress Note  03/23/2012 4:38 PM  Diagnosis:  Axis I: Major Depression, Recurrent severe Axis II: Borderline Personality Dis.  ADL's:  Intact  Sleep: Good  Appetite:  Fair  Suicidal Ideation:  Pt describes off and on suicidal thoughts.  Is struggling with the choice that she doesn't;t want to make about wanting to be here or not.  Pt says that she is hurting and she doesn't want to hurt any more. Homicidal Ideation:  Denies adamantly any homicidal thoughts.  Mental Status Examination/Evaluation: Objective:  Appearance: Casual  Eye Contact::  Fair  Speech:  Clear and Coherent  Volume:  Normal  Mood:  10 /10 on a scale of 1 is the best and 10 is the worst  Anxiety: 3/10 on the same scale and on Klonopin routine and PRN Xanax.  Affect:  Blunt  Thought Process:  Coherent  Orientation:  Full  Thought Content:  WDL  Suicidal Thoughts:  Yes.  without intent/plan  Homicidal Thoughts:  No  Memory:  Immediate;   Fair  Judgement:  Impaired  Insight:  Lacking  Psychomotor Activity:  Normal  Concentration:  Fair  Recall:  Fair  Akathisia:  No  Handed:  Right  AIMS (if indicated):     Assets:  Communication Skills Housing Physical Health  Sleep:      Vital Signs:Blood pressure 113/70, pulse 91, temperature 97.8 F (36.6 C), temperature source Oral, resp. rate 18. Current Medications: Current Facility-Administered Medications  Medication Dose Route Frequency Provider Last Rate Last Dose  . acetaminophen (TYLENOL) tablet 650 mg  650 mg Oral Q6H PRN Verne Spurr, PA-C      . ALPRAZolam Prudy Feeler) tablet 2 mg  2 mg Oral BID PRN Mike Craze, MD      . alum & mag hydroxide-simeth (MAALOX/MYLANTA) 200-200-20 MG/5ML suspension 30 mL  30 mL Oral Q4H PRN Verne Spurr, PA-C      . ARIPiprazole (ABILIFY) tablet 5 mg  5 mg Oral Daily Mickie D. Adams, PA   5 mg at 03/23/12 0824  . aspirin-acetaminophen-caffeine (EXCEDRIN MIGRAINE) per tablet 2 tablet  2 tablet Oral Q6H PRN Mickie  D. Adams, PA   2 tablet at 03/21/12 1311  . clonazePAM (KLONOPIN) tablet 1 mg  1 mg Oral TID Mickie D. Adams, PA   1 mg at 03/23/12 1146  . FLUoxetine (PROZAC) capsule 40 mg  40 mg Oral Daily Mike Craze, MD   40 mg at 03/23/12 4098  . hydrochlorothiazide (MICROZIDE) capsule 25 mg  25 mg Oral Daily Verne Spurr, PA-C   25 mg at 03/23/12 1191  . lamoTRIgine (LAMICTAL) tablet 50 mg  50 mg Oral Daily Mickie D. Adams, PA   50 mg at 03/23/12 0824  . lisinopril (PRINIVIL,ZESTRIL) tablet 20 mg  20 mg Oral Daily Verne Spurr, PA-C   20 mg at 03/23/12 4782  . magnesium hydroxide (MILK OF MAGNESIA) suspension 30 mL  30 mL Oral Daily PRN Verne Spurr, PA-C      . metoprolol tartrate (LOPRESSOR) tablet 25 mg  25 mg Oral BID Verne Spurr, PA-C   25 mg at 03/23/12 9562  . traZODone (DESYREL) tablet 50 mg  50 mg Oral QHS,MR X 1 Verne Spurr, PA-C   50 mg at 03/21/12 0021  . traZODone (DESYREL) tablet 50 mg  50 mg Oral QHS Mickie D. Adams, PA      . venlafaxine [facility]) tablet 225 mg  225 mg Oral Daily Mickie D. Pernell Dupre, PA   225  mg at 03/23/12 1610    Lab Results: No results found for this or any previous visit (from the past 48 hour(s)).  Physical Findings: AIMS:  , ,  ,  ,    CIWA:    COWS:     Treatment Plan Summary: Daily contact with patient to assess and evaluate symptoms and progress in treatment Medication management  Plan: Pt seems very conflicted by her fears of her panic attacks and her borderline personality sense that none of her needs are being met.  She is very reluctant to try anything different.  She is afraid that she will have to go back on Metformin again.    Discussed at length several options for her.  Discussed on a grid the following medications Geodon, Risperdal, Thorazine, Klonopin, Xanax, Tegretol, Inderal, Effexor, and Lamictal as they effect wt gain, anxiety, mood as depression and as mood up and down, impulsivity.  Pt was agreeable to trying Tegretol along with  stopping regular Klonopin and prn Xanax as well as starting Geodon for anxiety (quite a bit of which is related to her borderline personality disorder) and possible mood swings.  Khoa Opdahl 03/23/2012, 4:38 PM

## 2012-03-23 NOTE — H&P (Signed)
I have seen and examined this patient and agree with this evaluation.

## 2012-03-23 NOTE — Progress Notes (Signed)
Patient ID: [name], female   DOB: 04/10/1966, 46 y.o.   MRN: [id number] Pt. Reports anxiety, but incongruent with pt. Affect. Writer administered prn med (see MAR). Pt. Attended group and noted interacting with staff and client. No SHI. Staff will monitor q34min. Pt. Remains safe on the unit.

## 2012-03-24 MED ORDER — CARBAMAZEPINE 100 MG PO CHEW
100.0000 mg | CHEWABLE_TABLET | Freq: Two times a day (BID) | ORAL | Status: DC
  Administered 2012-03-24 – 2012-03-26 (×4): 100 mg via ORAL
  Filled 2012-03-24 (×8): qty 1

## 2012-03-24 NOTE — Tx Team (Signed)
Interdisciplinary Treatment Plan Update (Adult)  Date:  03/24/2012  Time Reviewed:  11:29 AM   Progress in Treatment: Attending groups: Yes Participating in groups:  Yes Taking medication as prescribed: Yes Tolerating medication:  Yes Family/Significant other contact made:  Counselor assessing for appropriate contact Patient understands diagnosis:  Yes Discussing patient identified problems/goals with staff:  Yes Medical problems stabilized or resolved:  Yes Denies suicidal/homicidal ideation: Yes Issues/concerns per patient self-inventory:  None identified Other: N/A  New problem(s) identified: None Identified  Reason for Continuation of Hospitalization: Anxiety Depression Medication stabilization  Interventions implemented related to continuation of hospitalization: mood stabilization, medication monitoring and adjustment, group therapy and psycho education, safety checks q 15 mins  Additional comments: N/A  Estimated length of stay: 3-5 days  Discharge Plan: Pt will follow up with Dr. Tiburcio Pea for medication management   New goal(s): N/A  Review of initial/current patient goals per problem list:    1.  Goal(s): Reduce depressive symptoms  Met:  No  Target date: by discharge  As evidenced by: Reducing depression from a 10 to a 3 as reported by pt. Pt ranks at a 5 today.   2.  Goal (s): Reduce/Eliminate suicidal ideation  Met:  No  Target date: by discharge  As evidenced by: pt reporting no SI.    3.  Goal(s): Reduce anxiety symptoms  Met:  No  Target date: by discharge  As evidenced by: Reduce anxiety from a 10 to a 3 as reported by pt. Pt ranks at a 5 today.    Attendees: Patient:  [name]  03/24/2012 11:32 AM   Family:     Physician:  Orson Aloe, MD  03/24/2012  11:29 AM   Nursing:   Roswell Miners, RN 03/24/2012 11:32 AM   Case Manager:  Reyes Ivan, LCSWA 03/24/2012  11:29 AM   Counselor:  Angus Palms, LCSW 03/24/2012  11:29 AM   Other:   Juline Patch, LCSW 03/24/2012  11:29 AM   Other: Quintella Reichert, RN 03/24/2012  11:29 AM   Other:     Other:      Scribe for Treatment Team:   Carmina Miller, 03/24/2012 , 11:29 AM

## 2012-03-24 NOTE — Progress Notes (Signed)
Patient ID: [name], female   DOB: 1966-12-22, 46 y.o.   MRN: [id number] She has been up more today and to parts of the groups. Lift one group because it made her anxious the said I am so tired I can't hold my eyes open. Wanted a klonopin  She was encouraged to try to lay down and   Rest. Continues to have off and on thoughts to harm herself, contracts for safety. Depressed and hopeless at 5.

## 2012-03-24 NOTE — Progress Notes (Signed)
[facility] MD Progress Note  03/24/2012 8:32 PM  Diagnosis:  Axis I: Major Depression, Recurrent severe Axis II: Borderline Personality Dis.  ADL's:  Intact  Sleep: Good  Appetite:  Fair  Suicidal Ideation:  Pt describes off and on suicidal thoughts.   Homicidal Ideation:  Denies adamantly any homicidal thoughts.  Mental Status Examination/Evaluation: Objective:  Appearance: Casual  Eye Contact::  Fair  Speech:  Clear and Coherent  Volume:  Normal  Mood:  5 /10 on a scale of 1 is the best and 10 is the worst  Anxiety: 5/10 on the same scale up a little off Xanax and on prn Klonopin with Tegretol.  Affect:  Blunt  Thought Process:  Coherent  Orientation:  Full  Thought Content:  WDL  Suicidal Thoughts:  Yes.  without intent/plan  Homicidal Thoughts:  No  Memory:  Immediate;   Fair  Judgement:  Impaired  Insight:  Lacking  Psychomotor Activity:  Normal  Concentration:  Fair  Recall:  Fair  Akathisia:  No  Handed:  Right  AIMS (if indicated):     Assets:  Communication Skills Housing Physical Health  Sleep:  Number of Hours: 6.75    Vital Signs:Blood pressure 102/72, pulse 80, temperature 97.8 F (36.6 C), temperature source Oral, resp. rate 18, SpO2 96.00%. Current Medications: Current Facility-Administered Medications  Medication Dose Route Frequency Provider Last Rate Last Dose  . acetaminophen (TYLENOL) tablet 650 mg  650 mg Oral Q6H PRN Verne Spurr, PA-C      . alum & mag hydroxide-simeth (MAALOX/MYLANTA) 200-200-20 MG/5ML suspension 30 mL  30 mL Oral Q4H PRN Verne Spurr, PA-C      . aspirin-acetaminophen-caffeine (EXCEDRIN MIGRAINE) per tablet 2 tablet  2 tablet Oral Q6H PRN Mickie D. Adams, PA   2 tablet at 03/21/12 1311  . carbamazepine (TEGRETOL) chewable tablet 100 mg  100 mg Oral BID Mike Craze, MD   100 mg at 03/24/12 1651  . clonazePAM (KLONOPIN) tablet 1 mg  1 mg Oral BID PRN Mike Craze, MD   1 mg at 03/23/12 2121  . estradiol (ESTRACE) tablet 2  mg  2 mg Oral Daily Mike Craze, MD   2 mg at 03/24/12 0813  . hydrochlorothiazide (MICROZIDE) capsule 25 mg  25 mg Oral Daily Verne Spurr, PA-C   25 mg at 03/24/12 0815  . lamoTRIgine (LAMICTAL) tablet 50 mg  50 mg Oral Daily Mickie D. Adams, PA   50 mg at 03/24/12 0815  . lisinopril (PRINIVIL,ZESTRIL) tablet 20 mg  20 mg Oral Daily Verne Spurr, PA-C   20 mg at 03/24/12 0817  . magnesium hydroxide (MILK OF MAGNESIA) suspension 30 mL  30 mL Oral Daily PRN Verne Spurr, PA-C      . medroxyPROGESTERone (PROVERA) tablet 2.5 mg  2.5 mg Oral Daily Mike Craze, MD   2.5 mg at 03/24/12 0818  . metoprolol tartrate (LOPRESSOR) tablet 25 mg  25 mg Oral BID Verne Spurr, PA-C   25 mg at 03/24/12 1651  . traZODone (DESYREL) tablet 50 mg  50 mg Oral QHS Mickie D. Adams, PA      . venlafaxine [facility]) tablet 225 mg  225 mg Oral Daily Mickie D. Adams, PA   225 mg at 03/24/12 0814  . ziprasidone (GEODON) capsule 20 mg  20 mg Oral BID WC Mike Craze, MD   20 mg at 03/24/12 1652  . DISCONTD: carbamazepine (TEGRETOL) tablet 200 mg  200 mg Oral BID Dorian Heckle  Dan Humphreys, MD   200 mg at 03/24/12 0817  . DISCONTD: traZODone (DESYREL) tablet 50 mg  50 mg Oral QHS,MR X 1 Verne Spurr, PA-C   50 mg at 03/21/12 0021    Lab Results: No results found for this or any previous visit (from the past 48 hour(s)).  Physical Findings: AIMS:  , ,  ,  ,    CIWA:    COWS:     Treatment Plan Summary: Daily contact with patient to assess and evaluate symptoms and progress in treatment Medication management  Plan: Pt did not get up for group this AM.  She noted sedation and dizziness from the Tegretol, so will decrease that to 100 mg TID.  She was noted to get up after 1500 yesterday.  She was asked how she managed to care for her child while sleeping until 1500.  She explained that she would wake up and take her child to school and then go back to bed.  She explained to the nurse that she is a light sleeper and she  tells her daughter to wake her up if she needs anything.  This could suggest that she is a physically sort of present but often sleeping parent that is not very available emotionally for this child. When she was awake yesterday, the staff noted that she was demanding and intrusive with others.  This seems very consistent with how she presented to me in our session.  The most interesting comment she made to the staff is that she wishes to quit having thoughts.  [name] 03/24/2012, 8:32 PM

## 2012-03-24 NOTE — Progress Notes (Signed)
[facility] Group Notes: (Counselor/Nursing/MHT/Case Management/Adjunct) 03/24/2012   @ 11:00 Feelings About Diagnosis  Type of Therapy:  Group Therapy  Participation Level:  Good  Participation Quality:  Attentive, Sharing, Appropriate  Affect:  Blunted  Cognitive:  Appropriate  Insight:  Good  Engagement in Group: Good   Engagement in Therapy:  Good  Modes of Intervention:  Support and Exploration  Summary of Progress/Problems: Leea processed her feelings about her sister's death, and how she experiences it as more traumatic than others may due to her diagnosis of Borderline Personality Disorder. She was very knowledgeable about the diagnosis, and explained it to other group members. Trini explored her thoughts and feelings about being seen as just her diagnosis, and identified that it makes her feel like an "other" and reinforces her belief system that contributes to BPD.    [name] 03/24/2012  2:53 PM

## 2012-03-24 NOTE — Progress Notes (Signed)
Pt did not attend d/c planning group on this date.  SW met with pt individually at this time.  Pt was laying in the bed and states that she has been unable to stay awake and believes it's a side effect from the medication changes yesterday.  Pt states that she has been unable to get up this morning for group and has been unable to think about depression or anxiety.  Pt was unable to ranks depression or anxiety.  SW will follow up with pt later.  SW left a voicemail message for Dr. Tiburcio Pea to confirm pt's follow up.  No further needs voiced by pt at this time.    Reyes Ivan, LCSWA 03/24/2012  9:53 AM

## 2012-03-24 NOTE — Progress Notes (Signed)
Pt. Has been up & about on the unit this PM.Pt. Reports that she had a panic attack just before coming for her HS medication,but did not really seem anxious.Pt. Refused the scheduled trazedone & wanted the PRN klonopin as pt. Stated that it works better for her to promote sleep.Pt. Denies SI,HI, & AVH.Pt.is pleasant & cooperative.Continues on 15 minute checks. Pt. Safety maintained.

## 2012-03-24 NOTE — Progress Notes (Signed)
Grief and loss group  Facilitated grief and loss group on [address] [facility]. Discussed group rules and limits of privacy/confidentiality. Discussed types of loss and normal grief reactions. Invited group members to share experiences w/ grief; topics focused on death/dying and interpersonal losses. Group was initially quiet but progressively engaged, was supportive of one another.   Pt was active and engaged in group, shared w/ group that she had lost sister and father yrs ago, each had suffered, and she has not been able to fully move on from these losses. Pt also stated that her other sister was recently dx w/ cirrhosis and is afraid she will also suffer and die. Pt received support and feedback from the group, appears to remain fused to loss and dealing w/ sadness d/t grief.  Malashia Kamaka B MS, LPCA, NCC

## 2012-03-24 NOTE — Progress Notes (Signed)
[facility] Group Notes: (Counselor/Nursing/MHT/Case Management/Adjunct) 03/24/2012   @1 :15pm Relaxation Techniques  Type of Therapy:  Group Therapy  Participation Level:  Good  Participation Quality:  Attentive, Appropriate  Affect:  Appropriate  Cognitive:  Appropriate  Insight:  Good  Engagement in Group:  Good   Engagement in Therapy:  Good  Modes of Intervention:  Support and Exploration  Summary of Progress/Problems: Fusaye participated in focused breathing and progressive muscle relaxation exercises. She processed her experience, stating that this was the first time she has been able to concentrate in this sort of exercise, and that she feels very relaxed afterward. Danali also identified that deep breathing techniques are difficult for due to her anticipatory anxiety regarding panic attacks. She stated that she has to do imagery in order to calm herself in this way.  [name] 03/24/2012 2:56 PM

## 2012-03-25 MED ORDER — PROPRANOLOL HCL ER 80 MG PO CP24
80.0000 mg | ORAL_CAPSULE | Freq: Every day | ORAL | Status: DC
Start: 2012-03-25 — End: 2012-03-27
  Administered 2012-03-25 – 2012-03-27 (×3): 80 mg via ORAL
  Filled 2012-03-25 (×5): qty 1

## 2012-03-25 MED ORDER — CHLORPROMAZINE HCL 25 MG PO TABS
25.0000 mg | ORAL_TABLET | Freq: Three times a day (TID) | ORAL | Status: DC | PRN
  Administered 2012-03-25: 25 mg via ORAL
  Filled 2012-03-25: qty 1

## 2012-03-25 MED ORDER — LAMOTRIGINE 25 MG PO TABS
25.0000 mg | ORAL_TABLET | Freq: Two times a day (BID) | ORAL | Status: DC
  Administered 2012-03-25 – 2012-03-27 (×4): 25 mg via ORAL
  Filled 2012-03-25 (×8): qty 1

## 2012-03-25 NOTE — Progress Notes (Signed)
Pt attended discharge planning group and actively participated.  SW notes that this is the first d/c planning group pt has made it to.  Pt explained that her medications haven't kicked in yet, which cause her to feel drowsy and unable to stay awake to go to groups.  Pt presents with calm mood and affect.  Pt ranks depression at a 5 and anxiety at a 6 today.  Pt denies SI, but reports SI last night.  Pt states that he had a panic attack last night and laid back down to manage it.  Pt states that she is concerned about how the meds are affecting her now and how she would be able to function and care for her daughter at home.  Pt states that she can't be drowsy or out of it caring for her daughter.  Pt also shared feelings of guilt for not being able to get out of the house or take her daughter places.  Pt states that she does feel more hopeful today.  Pt will follow up with Dr. Tiburcio Pea for medication management.  Pt also inquired about available DBT groups or resources available to her.  No further needs voiced by pt at this time.    [name], LCSWA 03/25/2012  10:01 AM    Per State Regulation 482.30 This Chart was reviewed for medical necessity with respect to the patient's Admission/Duration of stay.   [name]  03/25/2012  Next Review Date:  03/27/12

## 2012-03-25 NOTE — Progress Notes (Signed)
[facility] Group Notes:  (Counselor/Nursing/MHT/Case Management/Adjunct)  03/25/2012 11:55 AM  Type of Therapy:  Group Therapy  Participation Level:  Active  Participation Quality:  Appropriate  Affect:  Appropriate  Cognitive:  Appropriate  Insight:  Good  Engagement in Group:  Good  Engagement in Therapy:  Good  Modes of Intervention:  Support  Summary of Progress/Problems:Actively participated in group, sharing her own experiences in managing her emotions as a parent and in dealing with her husband, from whom she is separated.   Mendleson, Daved Mcfann 03/25/2012, 11:55 AM

## 2012-03-25 NOTE — Progress Notes (Signed)
[facility] MD Progress Note  03/25/2012 3:37 PM  Diagnosis:  Axis I: Major Depression, Recurrent severe Axis II: Borderline Personality Dis.  ADL's:  Intact  Sleep: Good  Appetite:  Good  Suicidal Ideation:  Pt denies any suicidal thoughts today Homicidal Ideation:  Denies adamantly any homicidal thoughts.  Mental Status Examination/Evaluation: Objective:  Appearance: Casual  Eye Contact::  Fair  Speech:  Clear and Coherent  Volume:  Normal  Mood:  3 /10 on a scale of 1 is the best and 10 is the worst  Anxiety: 3/10 on the same scale took prn Klonopin brief while ago with Tegretol  Will offer Thorazine as PRN to try.  Affect:  Blunt  Thought Process:  Coherent  Orientation:  Full  Thought Content:  WDL  Suicidal Thoughts:  Yes.  without intent/plan  Homicidal Thoughts:  No  Memory:  Immediate;   Fair  Judgement:  Impaired  Insight:  Lacking  Psychomotor Activity:  Normal  Concentration:  Fair  Recall:  Fair  Akathisia:  No  Handed:  Right  AIMS (if indicated):     Assets:  Communication Skills Desire for Improvement Housing Physical Health  Sleep:  Number of Hours: 6    Vital Signs:Blood pressure 132/82, pulse 89, temperature 97.7 F (36.5 C), temperature source Oral, resp. rate 20, SpO2 96.00%. Current Medications: Current Facility-Administered Medications  Medication Dose Route Frequency Provider Last Rate Last Dose  . acetaminophen (TYLENOL) tablet 650 mg  650 mg Oral Q6H PRN Verne Spurr, PA-C      . alum & mag hydroxide-simeth (MAALOX/MYLANTA) 200-200-20 MG/5ML suspension 30 mL  30 mL Oral Q4H PRN Verne Spurr, PA-C      . aspirin-acetaminophen-caffeine (EXCEDRIN MIGRAINE) per tablet 2 tablet  2 tablet Oral Q6H PRN Mickie D. Adams, PA   2 tablet at 03/21/12 1311  . carbamazepine (TEGRETOL) chewable tablet 100 mg  100 mg Oral BID Mike Craze, MD   100 mg at 03/25/12 0824  . chlorproMAZINE (THORAZINE) tablet 25 mg  25 mg Oral TID PRN Mike Craze, MD      .  clonazePAM [facility]) tablet 1 mg  1 mg Oral BID PRN Mike Craze, MD   1 mg at 03/25/12 1309  . estradiol (ESTRACE) tablet 2 mg  2 mg Oral Daily Mike Craze, MD   2 mg at 03/25/12 6578  . hydrochlorothiazide (MICROZIDE) capsule 25 mg  25 mg Oral Daily Verne Spurr, PA-C   25 mg at 03/25/12 4696  . lamoTRIgine (LAMICTAL) tablet 25 mg  25 mg Oral BID Mike Craze, MD      . lisinopril (PRINIVIL,ZESTRIL) tablet 20 mg  20 mg Oral Daily Verne Spurr, PA-C   20 mg at 03/25/12 2952  . magnesium hydroxide (MILK OF MAGNESIA) suspension 30 mL  30 mL Oral Daily PRN Verne Spurr, PA-C      . medroxyPROGESTERone (PROVERA) tablet 2.5 mg  2.5 mg Oral Daily Mike Craze, MD   2.5 mg at 03/25/12 0825  . propranolol (INDERAL LA) 24 hr capsule 80 mg  80 mg Oral Daily Mike Craze, MD      . traZODone (DESYREL) tablet 50 mg  50 mg Oral QHS Mickie D. Adams, PA      . venlafaxine [facility]) tablet 225 mg  225 mg Oral Daily Mickie D. Adams, PA   225 mg at 03/25/12 0823  . ziprasidone (GEODON) capsule 20 mg  20 mg Oral BID WC Mike Craze,  MD   20 mg at 03/25/12 0824  . DISCONTD: lamoTRIgine (LAMICTAL) tablet 50 mg  50 mg Oral Daily Mickie D. Adams, PA   50 mg at 03/25/12 0823  . DISCONTD: metoprolol tartrate (LOPRESSOR) tablet 25 mg  25 mg Oral BID Verne Spurr, PA-C   25 mg at 03/25/12 4098    Lab Results: No results found for this or any previous visit (from the past 48 hour(s)).  Physical Findings: AIMS:  , ,  ,  ,    CIWA:    COWS:     Treatment Plan Summary: Daily contact with patient to assess and evaluate symptoms and progress in treatment Medication management  Plan: Pt reports that she is thinking that she will give this life thing one more time.  She describes her panic attacks as being ones where her chest feels tight, her hands get clammy and she notices it hard to swallow or eat.  She notes rapid breathing.  The sweaty palms makes it sound like true panic attacks.   She has a  daughter who has asthma, but she has never noted any shortness of breath when exposed to cold air like her daughter.  Will try Inderal for her anxiety and replace her Metatoprolol.   Pharmacist suggests 80 mg LA.  She notes that the Trazodone doesn't work and requests to have that stopped.  She notes that the Tegretol makes her feel dizzy.  She says that she felt quite normal this AM, but after she took the Tegretol, she felt loopy.  Her eyes - she can't keep them open.  She was not comfortable rolling her head from side to side.  Will offer her Thorazine to take if she has another panic attack to try instead of the Klonopin.  Discussed the side effects and how it is not addictive and does not disinhibit like the Klonopin.  Chanti Golubski 03/25/2012, 3:37 PM

## 2012-03-25 NOTE — Progress Notes (Signed)
Patient ID: [name], female   DOB: 1966-04-22, 46 y.o.   MRN: [id number] Pt. Reports depression as 4 out of 5. Pt. Reports Tegretol working better, not as dizzy. Pt. Denies SHI. Pt. Goes to group and interacts with clients and staff. Staff will monitor q65min for safety. Pt. Remains safe on the unit.

## 2012-03-25 NOTE — Progress Notes (Signed)
Patient ID: [name], female   DOB: Jan 06, 1966, 46 y.o.   MRN: [id number]   Patient pleasant on approach this am. Reports depression "5" and feelings of hopelessness "3". Reports passive SI on and off. Does verbally report depression and passive SI is prior to taking medications but feels much better after taking medications. Did c/o feeling drowsy after taking the tegretol but took it anyway. Staff will continue to monitor and encourage group attendance.

## 2012-03-26 LAB — GLUCOSE, CAPILLARY: Glucose-Capillary: 100 mg/dL — ABNORMAL HIGH (ref 70–99)

## 2012-03-26 MED ORDER — CHLORPROMAZINE HCL 25 MG PO TABS: 50.0000 mg | ORAL_TABLET | Freq: Three times a day (TID) | ORAL | Status: DC | PRN

## 2012-03-26 MED ORDER — CARBAMAZEPINE 100 MG PO CHEW
50.0000 mg | CHEWABLE_TABLET | Freq: Three times a day (TID) | ORAL | Status: DC
  Administered 2012-03-26 – 2012-03-27 (×2): 50 mg via ORAL
  Filled 2012-03-26 (×8): qty 0.5

## 2012-03-26 NOTE — Progress Notes (Signed)
[facility] Group Notes: (Counselor/Nursing/MHT/Case Management/Adjunct) 03/26/2012   @1 :15pm       Lovingkindness/Self-worth with Meditation  Type of Therapy:  Group Therapy  Participation Level:  Active  Participation Quality: Attentive, Appropriate, Sharing   Affect:  Appropriate  Cognitive:  Appropriate  Insight:  Good  Engagement in Group: Good  Engagement in Therapy:  Good  Modes of Intervention:  Support and Exploration  Summary of Progress/Problems: [name] processed her belief that everyone should like her and her feelings of inadequacy if she cannot make a person like her. She went on to say that she can never see herself letting go and not taking it personally if someone does not like her, and discussed how she has a tendency to take on the personality of the people she is around. [name] stated that she has done this so much and with so many different people that she no longer knows who she really is. She participated in lovingkindness/self-worth meditation, and was powerfully impacted by the imagery. [name] described picturing herself as a fragile newly born chick being pulled into a comforting and loving nest. She went on to share that she cannot extend that love and peace to who she is right now, but that after doing this meditation several times she hopes to.   [name] 03/26/2012 3:06 PM

## 2012-03-26 NOTE — Progress Notes (Signed)
MD wrote order to ALLOW PATIENT TO VISIT ON 03/27/3012 WITH SON, SPECIAL VISIT FOR SON'S BIRTHDAY.  Patient has been cooperative and pleasant this afternoon.

## 2012-03-26 NOTE — Progress Notes (Signed)
Pt attended discharge planning group and actively participated.  Pt presents with calm mood and affect.  Pt ranks depression at a 1 and anxiety at a 3 today.  Pt denies SI.  Pt states that she still feels a little dizzy on the new medications.  Pt states that she is confused as to why she doesn't have obsessive and racing thoughts, as she normally would.  Pt states that she guesses this is the medication working and wonders if it is okay not to have these thoughts anymore.  Pt reports feeling stable to d/c tomorrow.  Pt will follow up with Dr. Tiburcio Pea for medication management.  No further needs voiced by pt at this time.  Safety planning and suicide prevention discussed.     Reyes Ivan, LCSWA 03/26/2012  10:56 AM

## 2012-03-26 NOTE — Progress Notes (Signed)
[facility] Group Notes: (Counselor/Nursing/MHT/Case Management/Adjunct) 03/26/2012   @  11:00am  Finding Balance in Life  Type of Therapy:  Group Therapy  Participation Level:  Active  Participation Quality:  Attentive, Appropriate, Sharing  Affect:  Appropriate  Cognitive:  Appropriate  Insight:  Limited  Engagement in Group: Good   Engagement in Therapy:  Good  Modes of Intervention:  Support and Exploration  Summary of Progress/Problems: [name] shared about the lack of balance in her life, focusing mostly on how she has gained so many responsibilities and when she has time for fun she is too tired to have any. She explored how her life changed from when she was independent and had few responsibilities but then she got married, had children, had family members whose health failed. She stated that she knows she should and often wants to fulfill her responsibilities of taking her daughter places or helping care for her mother, but that she would rather let go of the responsibility and just have fun. [name] explored with the counselor the dialectical concept of change and acceptance, and encouraged [name] to accept the life that she has and the person she is now, so that she can make the most of the life she has now, though [name] did not seem very receptive to this and went back to talking about wanting to be the person she once was.    [name] 03/26/2012   2:57 PM

## 2012-03-26 NOTE — Progress Notes (Signed)
Patient ID: [name], female   DOB: 15-Jun-1966, 46 y.o.   MRN: [id number]   Patient has a flat affect on approach today. States her mood is better but says she hasn't woke up fully yet. Wanting info on her medications so carenotes printed for her. Reports depression and hopelessness "3" on scale. Denies any SI at this time. Taking medications without issue but still c/o the tegretol at times. Staff will monitor and encourage group attendance.
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
## 2012-03-27 DIAGNOSIS — R45851 Suicidal ideations: Secondary | ICD-10-CM

## 2012-03-27 DIAGNOSIS — F0781 Postconcussional syndrome: Secondary | ICD-10-CM

## 2012-03-27 LAB — GLUCOSE, CAPILLARY: Glucose-Capillary: 98 mg/dL (ref 70–99)

## 2012-03-27 MED ORDER — CLONAZEPAM 1 MG PO TABS: 1.0000 mg | ORAL_TABLET | Freq: Three times a day (TID) | ORAL | Status: DC | PRN

## 2012-03-27 MED ORDER — ZIPRASIDONE HCL 20 MG PO CAPS: 20.0000 mg | ORAL_CAPSULE | Freq: Two times a day (BID) | ORAL | Status: DC

## 2012-03-27 MED ORDER — MEDROXYPROGESTERONE ACETATE 2.5 MG PO TABS: 2.5000 mg | ORAL_TABLET | Freq: Every day | ORAL | Status: DC

## 2012-03-27 MED ORDER — PROPRANOLOL HCL ER 80 MG PO CP24: 80.0000 mg | ORAL_CAPSULE | Freq: Every day | ORAL | Status: DC

## 2012-03-27 MED ORDER — CARBAMAZEPINE 100 MG PO CHEW: 50.0000 mg | CHEWABLE_TABLET | Freq: Three times a day (TID) | ORAL | Status: DC

## 2012-03-27 MED ORDER — VENLAFAXINE HCL 75 MG PO TABS: 225.0000 mg | ORAL_TABLET | Freq: Every day | ORAL | Status: DC

## 2012-03-27 MED ORDER — ESTRADIOL 2 MG PO TABS: 2.0000 mg | ORAL_TABLET | Freq: Every day | ORAL | Status: DC

## 2012-03-27 MED ORDER — LAMOTRIGINE 25 MG PO TABS: 50.0000 mg | ORAL_TABLET | Freq: Every day | ORAL | Status: DC

## 2012-03-27 MED ORDER — ASPIRIN-ACETAMINOPHEN-CAFFEINE 250-250-65 MG PO TABS: 2.0000 | ORAL_TABLET | Freq: Four times a day (QID) | ORAL | Status: AC | PRN

## 2012-03-27 MED ORDER — LISINOPRIL-HYDROCHLOROTHIAZIDE 20-25 MG PO TABS: 1.0000 | ORAL_TABLET | Freq: Every day | ORAL | Status: DC
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
## 2012-03-27 NOTE — Tx Team (Signed)
Interdisciplinary Treatment Plan Update (Adult)  Date:  03/27/2012  Time Reviewed:  11:08 AM   Progress in Treatment: Attending groups: Yes Participating in groups:  Yes Taking medication as prescribed: Yes Tolerating medication:  Yes Family/Significant other contact made:  Yes Patient understands diagnosis:  Yes Discussing patient identified problems/goals with staff:  Yes Medical problems stabilized or resolved:  Yes Denies suicidal/homicidal ideation: Yes Issues/concerns per patient self-inventory:  None identified Other: N/A  New problem(s) identified: None Identified  Reason for Continuation of Hospitalization: Stable to d/c  Interventions implemented related to continuation of hospitalization: Stable to d/c  Additional comments: N/A  Estimated length of stay: D/C today  Discharge Plan: Pt will follow up at [facility] with Dr. Lolly Mustache for medication management.   New goal(s): N/A  Review of initial/current patient goals per problem list:    1.  Goal(s): Reduce depressive symptoms  Met:  Yes  Target date: by discharge  As evidenced by: Reducing depression from a 10 to a 3 as reported by pt. Pt ranks at a 2 today.   2.  Goal (s): Reduce/Eliminate suicidal ideation  Met:  Yes  Target date: by discharge  As evidenced by: pt denies SI.    3.  Goal(s): Reduce anxiety symptoms  Met:  Yes  Target date: by discharge  As evidenced by: Reduce anxiety from a 10 to a 3 as reported by pt. Pt ranks at a 3 today.    Attendees: Patient:  [name] 03/27/2012 11:10 AM   Family:     Physician:  Orson Aloe, MD  03/27/2012  11:08 AM   Nursing:   Manuela Schwartz, RN 03/27/2012 11:11 AM   Case Manager:  Reyes Ivan, LCSWA 03/27/2012  11:08 AM   Counselor:  Angus Palms, LCSW 03/27/2012  11:08 AM   Other:  Juline Patch, LCSW 03/27/2012  11:08 AM   Other:    Other:     Other:      Scribe for Treatment Team:   Carmina Miller, 03/27/2012 , 11:08 AM

## 2012-03-27 NOTE — Progress Notes (Signed)
Patient ID: [name], female   DOB: Aug 04, 1966, 46 y.o.   MRN: [id number] Has been out and about on hall this evening, interacting with select peers and staff appropriately. Talked about losing her sister, and that her other sister was recently diagnosed with cirrhosis and realizes how ill she is.  Stated had made her feel so depressed she didn't want to be here anymore and had actually started looking for someone to look after her daughter.  States she now realizes she has to be a role model for her daughter and wants to get back on track.  Was pleasant and appropriate, brightened on approach, denies SI/HI, but felt she had needed to come in to have her meds adjusted.  States feeling better and feels is ready for d/c, hoping to leave tomorrow, which is her son's b-day.  No c/o's voiced.  Will continue to monitor.

## 2012-03-27 NOTE — Discharge Instructions (Signed)
For what is believed to be chronic tramatic encephalopathy, it advised that you get  regular exercise, regular sleep, and  consume good quality, fish oil, 1000 mg twice a day. These 3 things are the foundation of rehabilitating your brain. If memory is a problem then INSTEAD of the fish oil mentioned above, try using [employer]ics from [employer].  You can order online or by phone [phone]. It costs $99 for the first month, and $80 monthly thereafter, but that investment in your brain and the recovery of your brain proper functioning would seem worth it.  Keeping appointments and consistently taking medications are fundamental in your recovery.  Please report any medication difficulties to your prescriber.

## 2012-03-27 NOTE — Progress Notes (Signed)
[facility] Group Notes:  (Counselor/Nursing/MHT/Case Management/Adjunct)  03/27/2012 11:55 AM  Type of Therapy:  Group Therapy  Participation Level:  Active  Participation Quality:  Appropriate  Affect:  Appropriate  Cognitive:  Appropriate  Insight:  Good  Engagement in Group:  Good  Engagement in Therapy:  Good  Modes of Intervention:  Support  Summary of Progress/Problems: Patient participated actively and eagerly throughout group, requesting coping skills to help prevent relapse. Was able to identify some of her early warning signs for relapse (sleeping all day, refusing to answer the phone even to speak with people she normally wants to speak with), and was receptive to the idea of seeking help at this point, rather than waiting until she is at the point of overdosing.   Mendleson, [name] 03/27/2012, 11:55 AM

## 2012-03-27 NOTE — Progress Notes (Signed)
[facility] Case Management Discharge Plan:  Will you be returning to the same living situation after discharge: Yes,  return home At discharge, do you have transportation home?:Yes,  has transportation home Do you have the ability to pay for your medications:Yes,  access to meds  Release of information consent forms completed and in the chart;  Patient's signature needed at discharge.  Patient to Follow up at:  Follow-up Information    Follow up with Dr. Tiburcio Pea on 04/03/2012. (Appointment scheduled at 5:30 pm)    Contact information:   [address] [place], [place] [address] [phone]         Patient denies SI/HI:   Yes,  denies SI/HI    Safety Planning and Suicide Prevention discussed:  Yes,  discussed with pt  Barrier to discharge identified:No.  Summary and Recommendations: Pt attended discharge planning group and actively participated.  Pt presents with calm mood and affect.  Pt ranks depression at a 2 and anxiety at a 3 today.  Pt denies SI.  Pt reports feeling stable to d/c today.  No recommendations from SW.  No further needs voiced by pt.  Pt stable to discharge.     Carmina Miller 03/27/2012, 10:50 AM

## 2012-03-27 NOTE — Progress Notes (Signed)
Patient ID: [name], female   DOB: [birth year], 46 y.o.   MRN: [id number]   Patient met with treatment team this am and felt she was ready for discharge. Obtained all belongings, prescriptions, and follow-up appointments. Currently denies any SI at present. Taken out front. She had drove car here.

## 2012-03-27 NOTE — Progress Notes (Signed)
Pt is asleep at this time. Respirations are even and unlabored. Pt appears to be in no signs of distress at this time. Pt safety remains with q58min checks. Writer will continue to monitor pt.

## 2012-03-27 NOTE — Discharge Summary (Signed)
I agree with this D/C Summary.  

## 2012-03-27 NOTE — Discharge Summary (Signed)
Physician Discharge Summary Note  Patient:  [name] is an 46 y.o., female MRN:  [id number] DOB:  March 19, 1966 Patient phone:  [phone] (home)  Patient address:   [address] [place] [address],   Date of Admission:  03/20/2012 Date of Discharge: 03/27/12  Reason for Admission: Passive suicidal ideations.  Discharge Diagnoses: Principal Problem:  *Major depressive disorder, recurrent Active Problems:  Passive suicidal ideations  Chronic traumatic encephalopathy   Axis Diagnosis:   AXIS I:  Mjao depressive disorder, recurrent episode. AXIS II:  Deferred AXIS III:   Past Medical History  Diagnosis Date  . Hypertension   . Diabetes mellitus   . Arthritis   . Depression   . Anxiety   . Headache    AXIS IV:  other psychosocial or environmental problems AXIS V:  68  Level of Care:  OP  Hospital Course:  Ms. Hagner have had many psychiatric hospital admissions since age 21. She is being followed on an outpatient basis by Corrie Dandy in [place]. She has been stressed since her sister was diagnosed with nonalcoholic cirrhosis. Another sister died 2 years ago after a 10 month battle with ovarian cancer. Patient state cannot go through this again. Asks for Excedrin migraine for headache pain.   While a patient in this hospital, patient received medication management for her depression. She also was enrolled in group counseling and activities. Patient participated actively in group counseling and verbalized that she benefited from it. She also received medication management for her other health conditions.  She tolerated her treatment regimen without any significant adverse effects and or reactions noted and or reported. She reports on daily basis her improved mood and decreased suicidal ideations. Patient was open about how her sister's death few years ago affected her, and to see another sister diagnosed with liver cirrhosis brought back all the bad  memories that she did not think she can handle again. Patient stated that after being here, she is now willing and ready to be strong and present as a role model for her daughters.  Patient attended treatment team meeting this am and agreed with the team that she is stable for discharge. She will continue psychiatric care on an outpatient basis to maintain stability. She will follow-up with Dr. Tiburcio Pea on April 5th. 2013. The address, date and time for this appointment provided for patient. Patient adamantly denies any suicidal, homicidal ideations, auditory and visual hallucinations. She left [facility] facility with all personal belongings via personal arranged transport in no apparent distress. As to what she learned from being in this hospital, patient replied; "Meds do make a difference, staying positive does make a difference, trying to do things to live a better life, I do want to live again."   Consults:  None  Significant Diagnostic Studies:  None  Discharge Vitals:   Blood pressure 113/74, pulse 87, temperature 97.5 F (36.4 C), temperature source Oral, resp. rate 16, SpO2 96.00%.  Mental Status Exam: See Mental Status Examination and Suicide Risk Assessment completed by Attending Physician prior to discharge.  Discharge destination:  Home  Is patient on multiple antipsychotic therapies at discharge:  No   Has Patient had three or more failed trials of antipsychotic monotherapy by history:  No  Recommended Plan for Multiple Antipsychotic Therapies: NA   Medication List  As of 03/27/2012  1:32 PM   STOP taking these medications         alprazolam 2 MG tablet  FLUoxetine 40 MG capsule      metoprolol tartrate 25 MG tablet      traZODone 50 MG tablet         TAKE these medications      Indication    aspirin-acetaminophen-caffeine 250-250-65 MG per tablet   Commonly known as: EXCEDRIN MIGRAINE   Take 2 tablets by mouth every 6 (six) hours as needed for pain.        carbamazepine 100 MG chewable tablet   Commonly known as: TEGRETOL   Chew 0.5 tablets (50 mg total) by mouth 3 (three) times daily. 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 panic    Indication: Complex Partial Epilepsy      estradiol 2 MG tablet   Commonly known as: ESTRACE   Take 1 tablet (2 mg total) by mouth daily. For estrogen replacement       lamoTRIgine 25 MG tablet   Commonly known as: LAMICTAL   Take 2 tablets (50 mg total) by mouth daily. For mood control       lisinopril-hydrochlorothiazide 20-25 MG per tablet   Commonly known as: PRINZIDE,ZESTORETIC   Take 1 tablet by mouth daily. For control of high blood pressure       medroxyPROGESTERone 2.5 MG tablet   Commonly known as: PROVERA   Take 1 tablet (2.5 mg total) by mouth daily. For progesterone replacement       propranolol 80 MG 24 hr capsule   Commonly known as: INDERAL LA   Take 1 capsule (80 mg total) by mouth daily. For control of blood pressure and ANXIETY       venlafaxine 75 MG tablet   Commonly known as: EFFEXOR   Take 3 tablets (225 mg total) by mouth daily. For depression, could begin slow taper after getting stable on Tegretol       ziprasidone 20 MG capsule   Commonly known as: GEODON   Take 1 capsule (20 mg total) by mouth 2 (two) times daily with a meal. For mood control. Could open capsules and take lower dose to see if that helps headaches            Follow-up Information    Follow up with Dr. Tiburcio Pea on 04/03/2012. (Appointment scheduled at 5:30 pm)    Contact information:   [address] [place], [place] [address] [phone]         Follow-up recommendations:  Other:  Keep all scheduled follow-up appointments as recommended.  Comments:  Take all medications as prescribed.                       Report  any adverse effects from medications to your outpatient provider promptly.  Signed: Armandina Stammer I 03/27/2012, 1:32 PM

## 2012-03-30 NOTE — Progress Notes (Signed)
Patient Discharge Instructions:  Psychiatric Admission Assessment Note Provided,  03/30/2012 After Visit Summary (AVS) Provided,  03/30/2012 Face Sheet Provided, 03/30/2012 Faxed/Sent to the Next Level Care provider:  03/30/2012 Sent Suicide Risk Assessment - Discharge Assessment 03/30/2012  Faxed to [facility] [employer] [facility] [employer] - Dr. Tiburcio Pea @ [phone], Eduard Clos, 03/30/2012, 3:28 PM

## 2012-04-13 DIAGNOSIS — F4001 Agoraphobia with panic disorder: Secondary | ICD-10-CM | POA: Diagnosis not present

## 2012-04-30 DIAGNOSIS — R634 Abnormal weight loss: Secondary | ICD-10-CM | POA: Diagnosis not present

## 2012-04-30 DIAGNOSIS — E119 Type 2 diabetes mellitus without complications: Secondary | ICD-10-CM | POA: Diagnosis not present

## 2012-05-11 DIAGNOSIS — F4001 Agoraphobia with panic disorder: Secondary | ICD-10-CM | POA: Diagnosis not present

## 2012-06-10 DIAGNOSIS — F4001 Agoraphobia with panic disorder: Secondary | ICD-10-CM | POA: Diagnosis not present

## 2012-07-13 DIAGNOSIS — M25579 Pain in unspecified ankle and joints of unspecified foot: Secondary | ICD-10-CM | POA: Diagnosis not present

## 2012-07-21 DIAGNOSIS — F4001 Agoraphobia with panic disorder: Secondary | ICD-10-CM | POA: Diagnosis not present

## 2012-08-06 DIAGNOSIS — M79609 Pain in unspecified limb: Secondary | ICD-10-CM | POA: Diagnosis not present

## 2012-08-24 DIAGNOSIS — G4700 Insomnia, unspecified: Secondary | ICD-10-CM | POA: Diagnosis not present

## 2012-08-24 DIAGNOSIS — I10 Essential (primary) hypertension: Secondary | ICD-10-CM | POA: Diagnosis not present

## 2012-08-24 DIAGNOSIS — F411 Generalized anxiety disorder: Secondary | ICD-10-CM | POA: Diagnosis not present

## 2012-09-08 DIAGNOSIS — Q742 Other congenital malformations of lower limb(s), including pelvic girdle: Secondary | ICD-10-CM | POA: Diagnosis not present

## 2012-09-08 DIAGNOSIS — M79609 Pain in unspecified limb: Secondary | ICD-10-CM | POA: Diagnosis not present

## 2012-10-09 ENCOUNTER — Emergency Department (HOSPITAL_COMMUNITY)
Admission: EM | Admit: 2012-10-09 | Discharge: 2012-10-09 | Disposition: A | Discharge status: Other Acute Inpatient Hospital | Payer: 59 | Attending: Emergency Medicine | Admitting: Emergency Medicine

## 2012-10-09 ENCOUNTER — Inpatient Hospital Stay (HOSPITAL_COMMUNITY)
Admission: RE | Admit: 2012-10-09 | Discharge: 2012-10-14 | DRG: 885 | Disposition: A | Discharge status: Home / Self Care | Payer: 59 | Attending: Psychiatry | Admitting: Psychiatry

## 2012-10-09 ENCOUNTER — Encounter (HOSPITAL_COMMUNITY): Payer: Self-pay | Admitting: *Deleted

## 2012-10-09 ENCOUNTER — Emergency Department (HOSPITAL_COMMUNITY): Payer: 59

## 2012-10-09 DIAGNOSIS — R45851 Suicidal ideations: Secondary | ICD-10-CM | POA: Diagnosis not present

## 2012-10-09 DIAGNOSIS — M129 Arthropathy, unspecified: Secondary | ICD-10-CM | POA: Diagnosis not present

## 2012-10-09 DIAGNOSIS — F3289 Other specified depressive episodes: Secondary | ICD-10-CM | POA: Insufficient documentation

## 2012-10-09 DIAGNOSIS — R079 Chest pain, unspecified: Secondary | ICD-10-CM | POA: Diagnosis not present

## 2012-10-09 DIAGNOSIS — F1310 Sedative, hypnotic or anxiolytic abuse, uncomplicated: Secondary | ICD-10-CM | POA: Diagnosis present

## 2012-10-09 DIAGNOSIS — I10 Essential (primary) hypertension: Secondary | ICD-10-CM | POA: Diagnosis not present

## 2012-10-09 DIAGNOSIS — F1320 Sedative, hypnotic or anxiolytic dependence, uncomplicated: Secondary | ICD-10-CM

## 2012-10-09 DIAGNOSIS — Z79899 Other long term (current) drug therapy: Secondary | ICD-10-CM | POA: Diagnosis not present

## 2012-10-09 DIAGNOSIS — E119 Type 2 diabetes mellitus without complications: Secondary | ICD-10-CM | POA: Insufficient documentation

## 2012-10-09 DIAGNOSIS — F339 Major depressive disorder, recurrent, unspecified: Secondary | ICD-10-CM

## 2012-10-09 DIAGNOSIS — J9819 Other pulmonary collapse: Secondary | ICD-10-CM | POA: Diagnosis not present

## 2012-10-09 DIAGNOSIS — F329 Major depressive disorder, single episode, unspecified: Secondary | ICD-10-CM | POA: Insufficient documentation

## 2012-10-09 DIAGNOSIS — F411 Generalized anxiety disorder: Secondary | ICD-10-CM | POA: Insufficient documentation

## 2012-10-09 DIAGNOSIS — N390 Urinary tract infection, site not specified: Secondary | ICD-10-CM | POA: Diagnosis not present

## 2012-10-09 DIAGNOSIS — F39 Unspecified mood [affective] disorder: Principal | ICD-10-CM | POA: Diagnosis present

## 2012-10-09 DIAGNOSIS — F1994 Other psychoactive substance use, unspecified with psychoactive substance-induced mood disorder: Secondary | ICD-10-CM

## 2012-10-09 DIAGNOSIS — F0781 Postconcussional syndrome: Secondary | ICD-10-CM

## 2012-10-09 LAB — CBC WITH DIFFERENTIAL/PLATELET
Basophils Absolute: 0 10*3/uL (ref 0.0–0.1)
Eosinophils Relative: 2 % (ref 0–5)
HCT: 44.2 % (ref 36.0–46.0)
Hemoglobin: 14.5 g/dL (ref 12.0–15.0)
Lymphocytes Relative: 31 % (ref 12–46)
MCV: 78.8 fL (ref 78.0–100.0)
Monocytes Absolute: 0.5 10*3/uL (ref 0.1–1.0)
Monocytes Relative: 6 % (ref 3–12)
RDW: 13.2 % (ref 11.5–15.5)
WBC: 8.6 10*3/uL (ref 4.0–10.5)

## 2012-10-09 LAB — URINALYSIS, ROUTINE W REFLEX MICROSCOPIC
Bilirubin Urine: NEGATIVE
Glucose, UA: NEGATIVE mg/dL
Hgb urine dipstick: NEGATIVE
Ketones, ur: NEGATIVE mg/dL
Specific Gravity, Urine: 1.014 (ref 1.005–1.030)
pH: 5.5 (ref 5.0–8.0)

## 2012-10-09 LAB — BASIC METABOLIC PANEL
BUN: 12 mg/dL (ref 6–23)
Calcium: 10 mg/dL (ref 8.4–10.5)
Creatinine, Ser: 0.72 mg/dL (ref 0.50–1.10)
GFR calc Af Amer: >90 mL/min (ref >90)

## 2012-10-09 LAB — ETHANOL: Alcohol, Ethyl (B): <11 mg/dL (ref 0–11)

## 2012-10-09 LAB — RAPID URINE DRUG SCREEN, HOSP PERFORMED
Amphetamines: NOT DETECTED
Barbiturates: NOT DETECTED
Benzodiazepines: POSITIVE — AB
Cocaine: NOT DETECTED
Tetrahydrocannabinol: NOT DETECTED

## 2012-10-09 LAB — TROPONIN I: Troponin I: <0.3 ng/mL (ref <0.30)

## 2012-10-09 MED ORDER — LAMOTRIGINE 25 MG PO TABS: 50.0000 mg | ORAL_TABLET | Freq: Every day | ORAL | Status: DC

## 2012-10-09 MED ORDER — ARIPIPRAZOLE 2 MG PO TABS
2.0000 mg | ORAL_TABLET | Freq: Every day | ORAL | Status: DC
  Filled 2012-10-09: qty 1

## 2012-10-09 MED ORDER — HYDROCHLOROTHIAZIDE 12.5 MG PO CAPS
12.5000 mg | ORAL_CAPSULE | Freq: Every day | ORAL | Status: DC
  Filled 2012-10-09 (×3): qty 1

## 2012-10-09 MED ORDER — METOPROLOL TARTRATE 25 MG PO TABS
25.0000 mg | ORAL_TABLET | Freq: Two times a day (BID) | ORAL | Status: DC
  Administered 2012-10-10 – 2012-10-14 (×9): 25 mg via ORAL
  Filled 2012-10-09 (×13): qty 1

## 2012-10-09 MED ORDER — SULFAMETHOXAZOLE-TMP DS 800-160 MG PO TABS
1.0000 | ORAL_TABLET | Freq: Two times a day (BID) | ORAL | Status: DC
  Administered 2012-10-09 – 2012-10-10 (×2): 1 via ORAL
  Filled 2012-10-09 (×7): qty 1

## 2012-10-09 MED ORDER — LAMOTRIGINE 25 MG PO TABS
50.0000 mg | ORAL_TABLET | Freq: Every day | ORAL | Status: DC
  Administered 2012-10-10 – 2012-10-14 (×5): 50 mg via ORAL
  Filled 2012-10-09 (×6): qty 2

## 2012-10-09 MED ORDER — ACETAMINOPHEN 325 MG PO TABS
650.0000 mg | ORAL_TABLET | Freq: Four times a day (QID) | ORAL | Status: DC | PRN
Start: 2012-10-09 — End: 2012-10-14

## 2012-10-09 MED ORDER — MAGNESIUM HYDROXIDE 400 MG/5ML PO SUSP
30.0000 mL | Freq: Every day | ORAL | Status: DC | PRN
  Administered 2012-10-11: 30 mL via ORAL

## 2012-10-09 MED ORDER — ALPRAZOLAM 0.5 MG PO TABS: 0.5000 mg | ORAL_TABLET | Freq: Every evening | ORAL | Status: DC | PRN

## 2012-10-09 MED ORDER — ASPIRIN-ACETAMINOPHEN-CAFFEINE 250-250-65 MG PO TABS
2.0000 | ORAL_TABLET | Freq: Once | ORAL | Status: DC
  Administered 2012-10-09: 2 via ORAL
  Filled 2012-10-09: qty 2

## 2012-10-09 MED ORDER — MEDROXYPROGESTERONE ACETATE 2.5 MG PO TABS: 2.5000 mg | ORAL_TABLET | Freq: Every day | ORAL | Status: DC

## 2012-10-09 MED ORDER — ALPRAZOLAM 0.5 MG PO TABS
0.5000 mg | ORAL_TABLET | Freq: Every evening | ORAL | Status: DC | PRN
  Administered 2012-10-09: 0.5 mg via ORAL
  Filled 2012-10-09: qty 1

## 2012-10-09 MED ORDER — ALUM & MAG HYDROXIDE-SIMETH 200-200-20 MG/5ML PO SUSP
30.0000 mL | ORAL | Status: DC | PRN
  Administered 2012-10-10 (×3): 30 mL via ORAL

## 2012-10-09 MED ORDER — FUROSEMIDE 20 MG PO TABS: 20.0000 mg | ORAL_TABLET | Freq: Every day | ORAL | Status: DC

## 2012-10-09 MED ORDER — LISINOPRIL-HYDROCHLOROTHIAZIDE 20-25 MG PO TABS: 1.0000 | ORAL_TABLET | Freq: Every day | ORAL | Status: DC

## 2012-10-09 MED ORDER — FUROSEMIDE 20 MG PO TABS
20.0000 mg | ORAL_TABLET | Freq: Every day | ORAL | Status: DC
  Administered 2012-10-10 – 2012-10-14 (×5): 20 mg via ORAL
  Filled 2012-10-09 (×6): qty 1

## 2012-10-09 MED ORDER — LISINOPRIL 20 MG PO TABS
20.0000 mg | ORAL_TABLET | Freq: Every day | ORAL | Status: DC
  Administered 2012-10-10: 20 mg via ORAL
  Filled 2012-10-09 (×3): qty 1

## 2012-10-09 MED ORDER — CLONAZEPAM 1 MG PO TABS: 1.0000 mg | ORAL_TABLET | Freq: Three times a day (TID) | ORAL | Status: DC | PRN

## 2012-10-09 MED ORDER — MEDROXYPROGESTERONE ACETATE 2.5 MG PO TABS
2.5000 mg | ORAL_TABLET | Freq: Every day | ORAL | Status: DC
  Administered 2012-10-10 – 2012-10-14 (×5): 2.5 mg via ORAL
  Filled 2012-10-09 (×6): qty 1

## 2012-10-09 MED ORDER — VENLAFAXINE HCL ER 150 MG PO CP24: 150.0000 mg | ORAL_CAPSULE | Freq: Every day | ORAL | Status: DC

## 2012-10-09 MED ORDER — ESTRADIOL 2 MG PO TABS
2.0000 mg | ORAL_TABLET | Freq: Every day | ORAL | Status: DC
  Administered 2012-10-10 – 2012-10-14 (×5): 2 mg via ORAL
  Filled 2012-10-09 (×7): qty 1

## 2012-10-09 MED ORDER — LISINOPRIL 20 MG PO TABS: 20.0000 mg | ORAL_TABLET | Freq: Every day | ORAL | Status: DC

## 2012-10-09 MED ORDER — METOPROLOL TARTRATE 25 MG PO TABS
25.0000 mg | ORAL_TABLET | Freq: Two times a day (BID) | ORAL | Status: DC
  Administered 2012-10-09: 25 mg via ORAL
  Filled 2012-10-09: qty 1

## 2012-10-09 MED ORDER — SULFAMETHOXAZOLE-TMP DS 800-160 MG PO TABS
1.0000 | ORAL_TABLET | Freq: Two times a day (BID) | ORAL | Status: DC
  Administered 2012-10-09: 1 via ORAL
  Filled 2012-10-09: qty 1

## 2012-10-09 MED ORDER — VENLAFAXINE HCL ER 150 MG PO CP24
150.0000 mg | ORAL_CAPSULE | Freq: Every day | ORAL | Status: DC
  Administered 2012-10-10 – 2012-10-14 (×5): 150 mg via ORAL
  Filled 2012-10-09 (×6): qty 1

## 2012-10-09 MED ORDER — ASPIRIN-ACETAMINOPHEN-CAFFEINE 250-250-65 MG PO TABS
2.0000 | ORAL_TABLET | Freq: Once | ORAL | Status: DC
  Filled 2012-10-09: qty 2
  Filled 2012-10-09 (×2): qty 1
  Filled 2012-10-09 (×3): qty 2

## 2012-10-09 MED ORDER — HYDROCHLOROTHIAZIDE 12.5 MG PO CAPS: 12.5000 mg | ORAL_CAPSULE | Freq: Every day | ORAL | Status: DC

## 2012-10-09 MED ORDER — ESTRADIOL 2 MG PO TABS: 2.0000 mg | ORAL_TABLET | Freq: Every day | ORAL | Status: DC
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
## 2012-10-09 NOTE — BHH Counselor (Signed)
Pt has been accepted to [facility] to the care of Dr. Dan Humphreys. EDP has been notified and agrees with plan. Paperwork has been signed and faxed to [facility].

## 2012-10-09 NOTE — ED Provider Notes (Signed)
History     CSN: [id number]  Arrival date & time 10/09/12  1150   First MD Initiated Contact with Patient 10/09/12 1159      Chief Complaint  Patient presents with  . Depression    (Consider location/radiation/quality/duration/timing/severity/associated sxs/prior treatment) The history is provided by the patient.   patient presents with anxiety and depression. Reportedly sent from behavioral health for medical clearance. She does have some suicidal thoughts with plans to overdose. Previous overdose attempts. She states she has history anxiety and there're stressors in her life that it making it worse. She states she does not want to talk about what they are. No current suicide attempt. She states she has been having episodes of dull occasional chest pain. She is pain-free now. She states she has gone down on her Abilify because it was bothering her. No cough or trouble breathing. No abdominal pain. No fevers. No shortness of breath.  Past Medical History  Diagnosis Date  . Hypertension   . Diabetes mellitus   . Arthritis   . Depression   . Anxiety   . Headache     Past Surgical History  Procedure Date  . Foot surgery     No family history on file.  History  Substance Use Topics  . Smoking status: Never Smoker   . Smokeless tobacco: Never Used  . Alcohol Use: No    OB History    Grav Para Term Preterm Abortions TAB SAB Ect Mult Living                  Review of Systems  Constitutional: Negative for activity change and appetite change.  HENT: Negative for neck stiffness.   Eyes: Negative for pain.  Respiratory: Negative for chest tightness and shortness of breath.   Cardiovascular: Positive for chest pain. Negative for leg swelling.  Gastrointestinal: Negative for nausea, vomiting, abdominal pain and diarrhea.  Genitourinary: Negative for flank pain.  Musculoskeletal: Negative for back pain.  Skin: Negative for rash.  Neurological: Negative for weakness,  numbness and headaches.  Psychiatric/Behavioral: Positive for suicidal ideas and dysphoric mood. Negative for behavioral problems.    Allergies  Trazodone and nefazodone  Home Medications   Current Outpatient Rx  Name Route Sig Dispense Refill  . ALPRAZOLAM 0.5 MG PO TABS Oral Take 0.5 mg by mouth at bedtime as needed. For anxiety/sleep.    . ARIPIPRAZOLE 2 MG PO TABS Oral Take 2 mg by mouth at bedtime.    . ASPIRIN-ACETAMINOPHEN-CAFFEINE 250-250-65 MG PO TABS Oral Take 2 tablets by mouth every 6 (six) hours as needed. For migraines.    . CLONAZEPAM 1 MG PO TABS Oral Take 1 tablet (1 mg total) by mouth 3 (three) times daily as needed for anxiety (or insomnia on a very rare basis). panic 20 tablet 0  . ESTRADIOL 2 MG PO TABS Oral Take 1 tablet (2 mg total) by mouth daily. For estrogen replacement 30 tablet 0  . FUROSEMIDE 20 MG PO TABS Oral Take 20 mg by mouth daily.    Marland Kitchen LAMOTRIGINE 25 MG PO TABS Oral Take 2 tablets (50 mg total) by mouth daily. For mood control 60 tablet 0  . LISINOPRIL-HYDROCHLOROTHIAZIDE 20-25 MG PO TABS Oral Take 1 tablet by mouth daily. For control of high blood pressure 30 tablet 0  . MEDROXYPROGESTERONE ACETATE 2.5 MG PO TABS Oral Take 1 tablet (2.5 mg total) by mouth daily. For progesterone replacement 30 tablet 0  . METOPROLOL TARTRATE 25 MG PO  TABS Oral Take 25 mg by mouth 2 (two) times daily.    . VENLAFAXINE HCL ER 150 MG PO CP24 Oral Take 150 mg by mouth daily.      BP 131/81  Pulse 94  Temp 97.9 F (36.6 C) (Oral)  Resp 20  Ht 5\' 5"  (1.651 m)  Wt 206 lb (93.441 kg)  BMI 34.28 kg/m2  SpO2 96%  Physical Exam  Nursing note and vitals reviewed. Constitutional: She is oriented to person, place, and time. She appears well-developed and well-nourished.  HENT:  Head: Normocephalic and atraumatic.  Eyes: EOM are normal. Pupils are equal, round, and reactive to light.  Neck: Normal range of motion. Neck supple.  Cardiovascular: Normal rate, regular rhythm  and normal heart sounds.   No murmur heard. Pulmonary/Chest: Effort normal and breath sounds normal. No respiratory distress. She has no wheezes. She has no rales.  Abdominal: Soft. Bowel sounds are normal. She exhibits no distension. There is no tenderness. There is no rebound and no guarding.  Musculoskeletal: Normal range of motion.  Neurological: She is alert and oriented to person, place, and time. No cranial nerve deficit.  Skin: Skin is warm and dry.  Psychiatric: Her speech is normal.       Patient appears depressed    ED Course  Procedures (including critical care time)  Labs Reviewed  CBC WITH DIFFERENTIAL - Abnormal; Notable for the following:    RBC 5.61 (*)     MCH 25.8 (*)     All other components within normal limits  URINALYSIS, ROUTINE W REFLEX MICROSCOPIC - Abnormal; Notable for the following:    APPearance CLOUDY (*)     Leukocytes, UA LARGE (*)     All other components within normal limits  URINE RAPID DRUG SCREEN (HOSP PERFORMED) - Abnormal; Notable for the following:    Benzodiazepines POSITIVE (*)     All other components within normal limits  BASIC METABOLIC PANEL - Abnormal; Notable for the following:    Glucose, Bld 110 (*)     All other components within normal limits  CARBAMAZEPINE LEVEL, TOTAL - Abnormal; Notable for the following:    Carbamazepine Lvl <0.5 (*)     All other components within normal limits  URINE MICROSCOPIC-ADD ON - Abnormal; Notable for the following:    Bacteria, UA MANY (*)     All other components within normal limits  ETHANOL  TROPONIN I  URINE CULTURE   Dg Chest 2 View  10/09/2012  *RADIOLOGY REPORT*  Clinical Data: Right side chest pain, anxiety, hypertension, medical clearance  CHEST - 2 VIEW  Comparison: None  Findings: Normal heart size, mediastinal contours, and pulmonary vascularity. Minimal right basilar atelectasis. Lungs otherwise clear. No pleural effusion or pneumothorax. Bones unremarkable.  IMPRESSION: Minimal  right basilar atelectasis.   Original Report Authenticated By: Lollie Marrow, M.D.      1. Depression   2. UTI (urinary tract infection)      Date: 10/09/2012  Rate: 81  Rhythm: normal sinus rhythm  QRS Axis: normal  Intervals: normal  ST/T Wave abnormalities: normal  Conduction Disutrbances:none  Narrative Interpretation:   Old EKG Reviewed: none available    MDM  Patient with depression and suicidal thoughts. She does have a urinary tract infection. She appears to medically cleared at this time and will likely be placed at [facility] as soon as a bed is available        [employer]. Rubin Payor, MD 10/09/12 1536

## 2012-10-09 NOTE — ED Provider Notes (Signed)
History     CSN: [id number]  Arrival date & time 10/09/12  1150   First MD Initiated Contact with Patient 10/09/12 1159      Chief Complaint  Patient presents with  . Depression    (Consider location/radiation/quality/duration/timing/severity/associated sxs/prior treatment) HPI  Past Medical History  Diagnosis Date  . Hypertension   . Diabetes mellitus   . Arthritis   . Depression   . Anxiety   . Headache     Past Surgical History  Procedure Date  . Foot surgery     No family history on file.  History  Substance Use Topics  . Smoking status: Never Smoker   . Smokeless tobacco: Never Used  . Alcohol Use: No    OB History    Grav Para Term Preterm Abortions TAB SAB Ect Mult Living                  Review of Systems  Allergies  Trazodone and nefazodone  Home Medications   Current Outpatient Rx  Name Route Sig Dispense Refill  . ALPRAZOLAM 0.5 MG PO TABS Oral Take 0.5 mg by mouth at bedtime as needed. For anxiety/sleep.    . ARIPIPRAZOLE 2 MG PO TABS Oral Take 2 mg by mouth at bedtime.    . ASPIRIN-ACETAMINOPHEN-CAFFEINE 250-250-65 MG PO TABS Oral Take 2 tablets by mouth every 6 (six) hours as needed. For migraines.    . CLONAZEPAM 1 MG PO TABS Oral Take 1 tablet (1 mg total) by mouth 3 (three) times daily as needed for anxiety (or insomnia on a very rare basis). panic 20 tablet 0  . ESTRADIOL 2 MG PO TABS Oral Take 1 tablet (2 mg total) by mouth daily. For estrogen replacement 30 tablet 0  . FUROSEMIDE 20 MG PO TABS Oral Take 20 mg by mouth daily.    Marland Kitchen LAMOTRIGINE 25 MG PO TABS Oral Take 2 tablets (50 mg total) by mouth daily. For mood control 60 tablet 0  . LISINOPRIL-HYDROCHLOROTHIAZIDE 20-25 MG PO TABS Oral Take 1 tablet by mouth daily. For control of high blood pressure 30 tablet 0  . MEDROXYPROGESTERONE ACETATE 2.5 MG PO TABS Oral Take 1 tablet (2.5 mg total) by mouth daily. For progesterone replacement 30 tablet 0  . METOPROLOL TARTRATE 25 MG PO  TABS Oral Take 25 mg by mouth 2 (two) times daily.    . VENLAFAXINE HCL ER 150 MG PO CP24 Oral Take 150 mg by mouth daily.      BP 117/77  Pulse 77  Temp 98 F (36.7 C) (Oral)  Resp 18  Ht 5\' 5"  (1.651 m)  Wt 206 lb (93.441 kg)  BMI 34.28 kg/m2  SpO2 94%  Physical Exam  ED Course  Procedures (including critical care time)  Labs Reviewed  CBC WITH DIFFERENTIAL - Abnormal; Notable for the following:    RBC 5.61 (*)     MCH 25.8 (*)     All other components within normal limits  URINALYSIS, ROUTINE W REFLEX MICROSCOPIC - Abnormal; Notable for the following:    APPearance CLOUDY (*)     Leukocytes, UA LARGE (*)     All other components within normal limits  URINE RAPID DRUG SCREEN (HOSP PERFORMED) - Abnormal; Notable for the following:    Benzodiazepines POSITIVE (*)     All other components within normal limits  BASIC METABOLIC PANEL - Abnormal; Notable for the following:    Glucose, Bld 110 (*)     All other  components within normal limits  CARBAMAZEPINE LEVEL, TOTAL - Abnormal; Notable for the following:    Carbamazepine Lvl <0.5 (*)     All other components within normal limits  URINE MICROSCOPIC-ADD ON - Abnormal; Notable for the following:    Bacteria, UA MANY (*)     All other components within normal limits  ETHANOL  TROPONIN I  URINE CULTURE   Dg Chest 2 View  10/09/2012  *RADIOLOGY REPORT*  Clinical Data: Right side chest pain, anxiety, hypertension, medical clearance  CHEST - 2 VIEW  Comparison: None  Findings: Normal heart size, mediastinal contours, and pulmonary vascularity. Minimal right basilar atelectasis. Lungs otherwise clear. No pleural effusion or pneumothorax. Bones unremarkable.  IMPRESSION: Minimal right basilar atelectasis.   Original Report Authenticated By: Lollie Marrow, M.D.      1. Depression   2. UTI (urinary tract infection)       MDM  Transfer to behavioral health        Donnetta Hutching, MD 10/09/12 2035

## 2012-10-09 NOTE — Progress Notes (Signed)
Psychoeducational Group Note  Date:  10/09/2012 Time: 2000  Group Topic/Focus:  Wrap-Up Group:   The focus of this group is to help patients review their daily goal of treatment and discuss progress on daily workbooks.  Participation Level: Did Not Attend  Participation Quality:  Not Applicable  Affect:  Not Applicable  Cognitive:  Not Applicable  Insight:  Not Applicable  Engagement in Group: Not Applicable  Additional Comments:  The patient didn't attend group this evening since she had yet to be admitted to the unit.   Hazle Coca S 10/09/2012, 11:42 PM

## 2012-10-09 NOTE — ED Notes (Signed)
Patient states chest discomfort most likely related to anxiety-patient states she feels safe in hospital although use to the behavioral health hospital environment-encouraged patient to seek assistance if needed

## 2012-10-09 NOTE — ED Notes (Signed)
Pt states she drove self to [facility] today due to increasing sx of depression and anxiety. Pt was sent to [facility] for medical clearance.  Endorses intermittent SI with plan to overdose. Has previous overdose attempts. Denies HI. Denies sx of psychosis except "some paranoia."

## 2012-10-09 NOTE — BH Assessment (Signed)
Assessment Note   [name] is an 46 y.o. female. Walk in to be evaluated for admission to the hospital with sx of depression including suicidal ideation.She was here most recently at [facility] in March 2013. She states she comes here as a safety net when she cant manage any longer and in the past week she has been more focused on death and feels less safe. She is having thoughts of overdosing on her Xanax and then also having passive thoughts like wishing she had a heartattack and dies.Last attempt of killing self was 5+ years ago when she did overdose. She has a Dr. For therapy and medicine but she downsized her practice and now only sees her for medications and the Dr. Celene Kras her last appt and her next appt is in mid Nov.She feels sad, hopeless, fatiqued anxious, thoughts of suicide and death an increase in sleep without feeling rested and an increase in appetite. Has been feeling more depressed for the past 5 weeks and worsened in past week. Her sister that she describes as her best friend died of cancer 2 years ago and she has been dreaming about her and grieving her loss. Is med compliant but states doesn't think the medicine is working any more. Has not taken medicine for her diabetes for the past one and one half years and has been diabetic for four years.Her medical dr which she has been with for 17 years also retired recently and that has been a loss for her. She has not yet seen her new medical Dr. Earlyne Iba with NP and she was accepted pending a bed on [address] but no bed available at this time. Sent across the street to [facility] for medical clearance until a bed is ready here for her. Axis I: Major Depression, Recurrent severe Axis II: Deferred Axis III:  Past Medical History  Diagnosis Date  . Hypertension   . Diabetes mellitus   . Arthritis   . Depression   . Anxiety   . Headache    Axis IV: other psychosocial or environmental problems and problems with primary support  group Axis V: GAF=33  Past Medical History:  Past Medical History  Diagnosis Date  . Hypertension   . Diabetes mellitus   . Arthritis   . Depression   . Anxiety   . Headache     Past Surgical History  Procedure Date  . Foot surgery     Family History: No family history on file.  Social History:  reports that she has never smoked. She has never used smokeless tobacco. She reports that she does not drink alcohol or use illicit drugs.  Additional Social History:  Alcohol / Drug Use Pain Medications: not abusing Prescriptions: not abusing Over the Counter: not absuing History of alcohol / drug use?: No history of alcohol / drug abuse  CIWA:   COWS:    Allergies:  Allergies  Allergen Reactions  . Trazodone And Nefazodone     Caused insomnia    Home Medications:  (Not in a hospital admission)  OB/GYN Status:  No LMP recorded. Patient is postmenopausal.  General Assessment Data Location of Assessment: [facility] Assessment Services Living Arrangements: Children Can pt return to current living arrangement?: Yes Admission Status: Voluntary Is patient capable of signing voluntary admission?: Yes Transfer from: Home Referral Source: Self/Family/Friend  Education Status Is patient currently in school?: No  Risk to self Suicidal Ideation: Yes-Currently Present Suicidal Intent: No-Not Currently/Within Last 6 Months Is patient at  risk for suicide?: Yes Suicidal Plan?: Yes-Currently Present Specify Current Suicidal Plan: overdose and passive thoughts Access to Means: Yes Specify Access to Suicidal Means: has a supply of medications What has been your use of drugs/alcohol within the last 12 months?: none Previous Attempts/Gestures: Yes How many times?: 1  Other Self Harm Risks: depression Triggers for Past Attempts: Unknown Intentional Self Injurious Behavior: None Family Suicide History: No Recent stressful life event(s):  (states has had some issues but wouldnt  elaborate) Persecutory voices/beliefs?: No Depression: Yes Depression Symptoms: Despondent;Tearfulness;Isolating;Fatigue;Guilt;Loss of interest in usual pleasures;Feeling worthless/self pity Substance abuse history and/or treatment for substance abuse?: No Suicide prevention information given to non-admitted patients: Not applicable  Risk to Others Homicidal Ideation: No Thoughts of Harm to Others: No Current Homicidal Intent: No Current Homicidal Plan: No Access to Homicidal Means: No History of harm to others?: No Assessment of Violence: None Noted Does patient have access to weapons?: No Criminal Charges Pending?: No Does patient have a court date: No  Psychosis Hallucinations: None noted Delusions: None noted  Mental Status Report Appear/Hygiene:  (unremarkable) Eye Contact: Fair Motor Activity: Freedom of movement Speech: Soft;Slow Level of Consciousness: Alert Mood: Depressed Affect: Blunted Anxiety Level: Moderate Thought Processes: Coherent;Relevant Judgement: Impaired Orientation: Person;Place;Time;Situation Obsessive Compulsive Thoughts/Behaviors: None  Cognitive Functioning Concentration: Decreased Memory: Recent Intact;Remote Intact IQ: Average Insight: Fair Impulse Control: Fair Appetite: Good Weight Gain: 15  Sleep: Increased Total Hours of Sleep: 7  Vegetative Symptoms: Staying in bed  ADLScreening [facility] Assessment Services) Patient's cognitive ability adequate to safely complete daily activities?: Yes Patient able to express need for assistance with ADLs?: Yes Independently performs ADLs?: Yes (appropriate for developmental age)  Abuse/Neglect [facility]) Physical Abuse: Denies Verbal Abuse: Denies Sexual Abuse: Denies  Prior Inpatient Therapy Prior Inpatient Therapy: Yes Prior Therapy Dates: 02/2012 (states 6-12 pscyh admissions in past) Prior Therapy Facilty/Provider(s): [facility] Reason for Treatment: depression and suicidal ideations  Prior  Outpatient Therapy Prior Outpatient Therapy: Yes Prior Therapy Dates: current Prior Therapy Facilty/Provider(s): Dr in [place] that recently closed practice Reason for Treatment: depression  ADL Screening (condition at time of admission) Patient's cognitive ability adequate to safely complete daily activities?: Yes Patient able to express need for assistance with ADLs?: Yes Independently performs ADLs?: Yes (appropriate for developmental age) Weakness of Legs: None Weakness of Arms/Hands: None  Home Assistive Devices/Equipment Home Assistive Devices/Equipment: None    Abuse/Neglect Assessment (Assessment to be complete while patient is alone) Physical Abuse: Denies Verbal Abuse: Denies Sexual Abuse: Denies Exploitation of patient/patient's resources: Denies Self-Neglect: Denies Values / Beliefs Cultural Requests During Hospitalization: None Spiritual Requests During Hospitalization: None   Advance Directives (For Healthcare) Advance Directive: Patient does not have advance directive Pre-existing out of facility DNR order (yellow form or pink MOST form): No Nutrition Screen- [facility] Adult/WL/AP Have you recently lost weight without trying?: No Have you been eating poorly because of a decreased appetite?: No Malnutrition Screening Tool Score: 0   Additional Information 1:1 In Past 12 Months?: No CIRT Risk: No Elopement Risk: No Does patient have medical clearance?: No     Disposition:  Disposition Disposition of Patient: Referred to [facility] for medical clearance and then accepted here) Patient referred to:  [facility])  On Site Evaluation by:   Reviewed with Physician:     Wynona Luna 10/09/2012 11:17 AM

## 2012-10-10 DIAGNOSIS — F332 Major depressive disorder, recurrent severe without psychotic features: Secondary | ICD-10-CM | POA: Diagnosis not present

## 2012-10-10 LAB — GLUCOSE, CAPILLARY
Glucose-Capillary: 135 mg/dL — ABNORMAL HIGH (ref 70–99)
Glucose-Capillary: 157 mg/dL — ABNORMAL HIGH (ref 70–99)

## 2012-10-10 MED ORDER — ADULT MULTIVITAMIN W/MINERALS CH
1.0000 | ORAL_TABLET | Freq: Every day | ORAL | Status: DC
  Administered 2012-10-10: 1 via ORAL
  Filled 2012-10-10 (×6): qty 1

## 2012-10-10 MED ORDER — CHLORDIAZEPOXIDE HCL 25 MG PO CAPS: 25.0000 mg | ORAL_CAPSULE | Freq: Three times a day (TID) | ORAL | Status: DC

## 2012-10-10 MED ORDER — LISINOPRIL-HYDROCHLOROTHIAZIDE 20-25 MG PO TABS: 1.0000 | ORAL_TABLET | Freq: Every day | ORAL | Status: DC

## 2012-10-10 MED ORDER — CLONAZEPAM 1 MG PO TABS
1.0000 mg | ORAL_TABLET | Freq: Three times a day (TID) | ORAL | Status: DC | PRN
  Administered 2012-10-10 – 2012-10-14 (×8): 1 mg via ORAL
  Filled 2012-10-10 (×8): qty 1

## 2012-10-10 MED ORDER — CHLORDIAZEPOXIDE HCL 25 MG PO CAPS
25.0000 mg | ORAL_CAPSULE | Freq: Four times a day (QID) | ORAL | Status: DC
  Administered 2012-10-10: 25 mg via ORAL

## 2012-10-10 MED ORDER — HYDROCHLOROTHIAZIDE 25 MG PO TABS
25.0000 mg | ORAL_TABLET | Freq: Every day | ORAL | Status: DC
  Filled 2012-10-10 (×5): qty 1

## 2012-10-10 MED ORDER — LAMOTRIGINE 25 MG PO TABS: 50.0000 mg | ORAL_TABLET | Freq: Every day | ORAL | Status: DC

## 2012-10-10 MED ORDER — ESTRADIOL 2 MG PO TABS: 2.0000 mg | ORAL_TABLET | Freq: Every day | ORAL | Status: DC

## 2012-10-10 MED ORDER — METOPROLOL TARTRATE 25 MG PO TABS: 25.0000 mg | ORAL_TABLET | Freq: Two times a day (BID) | ORAL | Status: DC

## 2012-10-10 MED ORDER — ONDANSETRON 4 MG PO TBDP: 4.0000 mg | ORAL_TABLET | Freq: Four times a day (QID) | ORAL | Status: AC | PRN

## 2012-10-10 MED ORDER — THIAMINE HCL 100 MG/ML IJ SOLN: 100.0000 mg | Freq: Once | INTRAMUSCULAR | Status: DC

## 2012-10-10 MED ORDER — CHLORDIAZEPOXIDE HCL 25 MG PO CAPS: 25.0000 mg | ORAL_CAPSULE | ORAL | Status: DC

## 2012-10-10 MED ORDER — CLONAZEPAM 1 MG PO TABS
1.0000 mg | ORAL_TABLET | Freq: Two times a day (BID) | ORAL | Status: DC
  Administered 2012-10-10: 1 mg via ORAL
  Filled 2012-10-10: qty 1

## 2012-10-10 MED ORDER — ALPRAZOLAM 0.5 MG PO TABS: 0.5000 mg | ORAL_TABLET | Freq: Every evening | ORAL | Status: DC | PRN

## 2012-10-10 MED ORDER — ARIPIPRAZOLE 5 MG PO TABS
5.0000 mg | ORAL_TABLET | Freq: Every day | ORAL | Status: DC
  Filled 2012-10-10 (×2): qty 1

## 2012-10-10 MED ORDER — SULFAMETHOXAZOLE-TMP DS 800-160 MG PO TABS
1.0000 | ORAL_TABLET | Freq: Two times a day (BID) | ORAL | Status: DC
  Administered 2012-10-10 – 2012-10-14 (×7): 1 via ORAL
  Filled 2012-10-10 (×10): qty 1

## 2012-10-10 MED ORDER — CHLORDIAZEPOXIDE HCL 25 MG PO CAPS
50.0000 mg | ORAL_CAPSULE | Freq: Once | ORAL | Status: DC
  Filled 2012-10-10: qty 1

## 2012-10-10 MED ORDER — VITAMIN B-1 100 MG PO TABS
100.0000 mg | ORAL_TABLET | Freq: Every day | ORAL | Status: DC
  Administered 2012-10-11 – 2012-10-12 (×2): 100 mg via ORAL
  Filled 2012-10-10 (×5): qty 1

## 2012-10-10 MED ORDER — CHLORDIAZEPOXIDE HCL 25 MG PO CAPS: 25.0000 mg | ORAL_CAPSULE | Freq: Every day | ORAL | Status: DC

## 2012-10-10 MED ORDER — ASPIRIN-ACETAMINOPHEN-CAFFEINE 250-250-65 MG PO TABS
2.0000 | ORAL_TABLET | Freq: Four times a day (QID) | ORAL | Status: DC | PRN
  Administered 2012-10-10 – 2012-10-13 (×5): 2 via ORAL
  Filled 2012-10-10: qty 2

## 2012-10-10 MED ORDER — FUROSEMIDE 20 MG PO TABS: 20.0000 mg | ORAL_TABLET | Freq: Every day | ORAL | Status: DC

## 2012-10-10 MED ORDER — LOPERAMIDE HCL 2 MG PO CAPS: 2.0000 mg | ORAL_CAPSULE | ORAL | Status: AC | PRN

## 2012-10-10 MED ORDER — ALPRAZOLAM 0.5 MG PO TABS
0.5000 mg | ORAL_TABLET | Freq: Every evening | ORAL | Status: DC | PRN
  Administered 2012-10-10 – 2012-10-14 (×3): 0.5 mg via ORAL
  Filled 2012-10-10 (×3): qty 1

## 2012-10-10 MED ORDER — CHLORDIAZEPOXIDE HCL 25 MG PO CAPS
25.0000 mg | ORAL_CAPSULE | Freq: Four times a day (QID) | ORAL | Status: AC | PRN
  Filled 2012-10-10: qty 1

## 2012-10-10 MED ORDER — HYDROXYZINE HCL 25 MG PO TABS
25.0000 mg | ORAL_TABLET | Freq: Four times a day (QID) | ORAL | Status: AC | PRN
  Administered 2012-10-10: 25 mg via ORAL

## 2012-10-10 MED ORDER — ARIPIPRAZOLE 2 MG PO TABS
2.0000 mg | ORAL_TABLET | Freq: Every day | ORAL | Status: DC
  Administered 2012-10-10: 2 mg via ORAL
  Filled 2012-10-10 (×3): qty 1

## 2012-10-10 MED ORDER — MEDROXYPROGESTERONE ACETATE 2.5 MG PO TABS: 2.5000 mg | ORAL_TABLET | Freq: Every day | ORAL | Status: DC

## 2012-10-10 MED ORDER — VENLAFAXINE HCL ER 150 MG PO CP24
150.0000 mg | ORAL_CAPSULE | Freq: Every day | ORAL | Status: DC
  Administered 2012-10-10: 150 mg via ORAL

## 2012-10-10 NOTE — Progress Notes (Signed)
Patient ID: [name], female   DOB: [birth year], 46 y.o.   MRN: [id number] D: Pt is awake and active on the unit this AM. Pt denies HI and A/V hallucinations but she endorses passive SI. Pt is able to contract for safety . Pt is participating in the milieu and is cooperative with staff. Pt rates their depression at 10 and hopelessness at 10. Pt mood is depressed and sad and her affect is flat/sad. Pt requested that staff get the bag from the passenger seat of her car that she packed for her stay at [facility]. Pt also requested to speak with a female staff member about an issue. Her counselor took time to sit with the pt. Pt has been somewhat needy and demanding of staff time, but is consolable.   A: Writer offered self, utilized therapeutic communication and administered medication per MD orders. Writer also encouraged pt to discuss feelings with staff and attend groups. Writer got the pt's car keys from her locker with the assistance from Engineer, materials Mellody Dance and we walked to the pt's car, a white [employer] AM parked out front. Writer retrieved the brown paper bag she described from her car, returned the car keys to her locker and brought the bad onto the unit to be inspected.   R: Pt is attending groups and tolerating medications well. Writer will continue to monitor. 15 minute checks are ongoing for safety.

## 2012-10-10 NOTE — Progress Notes (Signed)
Adult Psychosocial Assessment Update Interdisciplinary Team  [facility] admissions/discharges:  Admissions Discharges  Date: 03/20/12 Date: 03/27/12  Date: Date:  Date: Date:  Date: Date:  Date: Date:   Changes since the last Psychosocial Assessment (including adherence to outpatient mental health and/or substance abuse treatment, situational issues contributing to decompensation and/or relapse). Pt states that she has lost both her primary physician and her psychiatrist.  Pt expresses themes of being unable to cope with a string of losses that stem back from the loss of her sister 2 years ago.  Pt is unsure if her psychiatrist will keep her on at the new practice and expresses concerns over her medication including her clonopin being ceased here.  Pt expresses difficulty sleeping and swallowing her saliva due to medication switches. Pt stated that she has been engaging in promiscuous sexual behaviors to replace the losses she has been dealing with.  Pt noted that her relationships are unhealthy and she wants to cease that type of behavior.             Discharge Plan 1. Will you be returning to the same living situation after discharge?   Yes:  X No:      If no, what is your plan?    Pt was living with her daughter and plans to return.       2. Would you like a referral for services when you are discharged? Yes:  X   If yes, for what services?  No:       Pt states that she needs a new primary physician and that she may need a new psychiatrist if her current one decides to not keep her on at the new office.       Summary and Recommendations (to be completed by the evaluator) Recommendations include crisis stabilization, case management, medication management, psycho-education groups to teach coping skills and group therapy.  Pt should develop 2-3 healthy coping skills to replace her unhealthy coping skill of sexual promiscuity.                        Signature:  Debarah Crape, 10/10/2012 10:08 AM
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
## 2012-10-10 NOTE — Progress Notes (Signed)
[facility] Group Notes:  (Counselor/Nursing/MHT/Case Management/Adjunct)  10/10/2012 11:12 PM  Type of Therapy:    Participation Level:  Active  Participation Quality:  Appropriate  Affect:  Appropriate  Cognitive:  Appropriate  Insight:  Good  Engagement in Group:  Good  Engagement in Therapy:  Good  Modes of Intervention:  Education  Summary of Progress/Problems: The patient's day got off to a bad start, but it improved by the afternoon. She states that she is pleased with the fact that her doctor placed her back on her old medicine. Her goal for tomorrow is to see how well the Abilify works.    Minnette Merida S 10/10/2012, 11:12 PM

## 2012-10-10 NOTE — Progress Notes (Signed)
Patient ID: [name], female   DOB: 03/16/1966, 46 y.o.   MRN: [id number] Pt. attended and participated in aftercare planning group. Pt. accepted information on suicide prevention, warning signs to look for with suicide and crisis line numbers to use. The pt. agreed to call crisis line numbers if having warning signs or having thoughts of suicide. Pt. listed their current anxiety level as none and depression as a 9 or 10 (both on a scale of 1-10 with 1 being lowest and 10 being highest)  Pt stated she is having SI/HI on and off and is able to contract for safety.
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
## 2012-10-10 NOTE — H&P (Signed)
  Pt was seen by me today and I agree with the key elements documented in H&P.  

## 2012-10-10 NOTE — Progress Notes (Signed)
Patient ID: [name], female   DOB: [birth year], 46 y.o.   MRN: [id number]  [facility] Group Notes:  (Counselor/Nursing/MHT/Case Management/Adjunct)  10/10/2012 1:15 PM  Type of Therapy:  Group Therapy  Participation Level:  Did Not Attend  Modes of Intervention:  Clarification, Problem-solving, Role-play, Socialization and Support  Summary of Progress/Problems:  Pt did not attend counseling group on positive and negative emotions and how to utilize coping skills to progress from negative to positive emotional states.  Counselor checked in with client after group where she stated that she was having a headache and that is why she did not attend group.  Debarah Crape 10/10/2012. 2:47 PM

## 2012-10-11 LAB — URINE CULTURE: Colony Count: 15000

## 2012-10-11 MED ORDER — ARIPIPRAZOLE 5 MG PO TABS
5.0000 mg | ORAL_TABLET | Freq: Every day | ORAL | Status: DC
  Administered 2012-10-11 – 2012-10-13 (×3): 5 mg via ORAL
  Filled 2012-10-11 (×4): qty 1

## 2012-10-11 MED ORDER — SUMATRIPTAN SUCCINATE 6 MG/0.5ML ~~LOC~~ SOLN
6.0000 mg | Freq: Once | SUBCUTANEOUS | Status: AC
Start: 2012-10-11 — End: 2012-10-11
  Administered 2012-10-11: 6 mg via SUBCUTANEOUS
  Filled 2012-10-11 (×2): qty 0.5

## 2012-10-11 NOTE — Progress Notes (Signed)
Psychoeducational Group Note  Date:  10/11/2012 Time:  1015  Group Topic/Focus:  Making Healthy Choices:   The focus of this group is to help patients identify negative/unhealthy choices they were using prior to admission and identify positive/healthier coping strategies to replace them upon discharge.  Participation Level:  Active  Participation Quality:  Appropriate  Affect:  Flat  Cognitive:  Alert  Insight:  Limited  Engagement in Group:  Good  Additional Comments:    Dione Housekeeper 10/11/2012

## 2012-10-11 NOTE — Progress Notes (Signed)
Patient ID: [name], female   DOB: [birth year], 46 y.o.   MRN: [id number]  Pt. attended and participated in aftercare planning group. Pt. accepted information on suicide prevention, warning signs to look for with suicide and crisis line numbers to use. The pt. agreed to call crisis line numbers if having warning signs or having thoughts of suicide. Pt. listed their current anxiety level as 9 and her depression as a low (both out of  10 with 1 being lowest and 10 being highest).  Pt stated she is having no SI or HI and that she has high hopelessness right now as she cannot for see anything changing at D/C.

## 2012-10-11 NOTE — Progress Notes (Signed)
Patient ID: [name], female   DOB: [birth year], 46 y.o.   MRN: [id number] [facility] Group Notes:  (Counselor/Nursing/MHT/Case Management/Adjunct)  10/11/2012 1:15 PM  Type of Therapy:  Group Therapy, Dance/Movement Therapy   Participation Level:  Did Not Attend  Modes of Intervention:  Clarification, Problem-solving, Role-play, Socialization and Support  Summary of Progress/Problems:    Pt did not attend group therapy focused on identifying supports, and distinguishing between healthy and unhealthy supports.   Pt stated that she had a headache and excused herself as group began.  Debarah Crape 10/11/2012. 3:02 PM

## 2012-10-11 NOTE — Progress Notes (Signed)
Patient ID: [name], female   DOB: June 08, 1966, 46 y.o.   MRN: [id number] D: Pt is awake and active on the unit this AM. Pt denies SI/HI and A/V hallucinations. Pt is participating in the milieu and is cooperative with staff. Pt rates their depression at 8 and hopelessness at 8. Pt's most recent CIWA score was. Pt mood is depressed and anxious and her affect is anxious. Pt is attention seeking and intrusive and needs a lot of redirection. Pt writes that she wants to make better choices in the future. Writer encouraged pt to get involved with a support group after discharge.   A: Writer offered self, utilized therapeutic communication and administered medication per MD orders. Writer also encouraged pt to discuss feelings with staff and attend groups. Pt constantly makes excuses as to why she cannot take staff suggestions for self improvement. Pt goes in and out of groups and has poor insight.   R: Pt is attending groups and tolerating medications well. Writer will continue to monitor. 15 minute checks are ongoing for safety.

## 2012-10-11 NOTE — Progress Notes (Signed)
  [name] is a 46 y.o. female [id number] Jan 19, 1966  10/09/2012 Active Problems:  Passive suicidal ideations   Mental Status: Mood brighter calmer. Denies SI or HI no AVH. Put make up on today.   Subjective/Objective: Feels hopeless because once she goes home nothing will have changed. Lives in fear. Would like to find a therapist in [place] and or a support group. Has internet access -printed off You Tube page which shows information about  Transcendental Meditation and demonstrates how to do it.    Filed Vitals:   10/11/12 0845  BP: 142/86  Pulse: 93  Temp:   Resp:     Lab Results:   BMET    Component Value Date/Time   NA 138 10/09/2012 1222   K 3.9 10/09/2012 1222   CL 97 10/09/2012 1222   CO2 29 10/09/2012 1222   GLUCOSE 110* 10/09/2012 1222   BUN 12 10/09/2012 1222   CREATININE 0.72 10/09/2012 1222   CALCIUM 10.0 10/09/2012 1222   GFRNONAA >90 10/09/2012 1222   GFRAA >90 10/09/2012 1222    Medications:  Scheduled:     . ARIPiprazole  5 mg Oral QHS  . aspirin-acetaminophen-caffeine  2 tablet Oral Once  . estradiol  2 mg Oral Daily  . furosemide  20 mg Oral Daily  . hydrochlorothiazide  25 mg Oral Daily  . lamoTRIgine  50 mg Oral Daily  . medroxyPROGESTERone  2.5 mg Oral Daily  . metoprolol tartrate  25 mg Oral BID  . multivitamin with minerals  1 tablet Oral Daily  . sulfamethoxazole-trimethoprim  1 tablet Oral Q12H  . thiamine  100 mg Intramuscular Once  . thiamine  100 mg Oral Daily  . venlafaxine XR  150 mg Oral Daily  . DISCONTD: ARIPiprazole  2 mg Oral QHS  . DISCONTD: ARIPiprazole  5 mg Oral QHS  . DISCONTD: clonazePAM  1 mg Oral BID  . DISCONTD: estradiol  2 mg Oral Daily  . DISCONTD: furosemide  20 mg Oral Daily  . DISCONTD: hydrochlorothiazide  12.5 mg Oral Daily  . DISCONTD: lamoTRIgine  50 mg Oral Daily  . DISCONTD: lisinopril  20 mg Oral Daily  . DISCONTD: lisinopril-hydrochlorothiazide  1 tablet Oral Daily  . DISCONTD:  medroxyPROGESTERone  2.5 mg Oral Daily  . DISCONTD: metoprolol tartrate  25 mg Oral BID  . DISCONTD: sulfamethoxazole-trimethoprim  1 tablet Oral Q12H  . DISCONTD: venlafaxine XR  150 mg Oral Daily     PRN Meds acetaminophen, ALPRAZolam, alum & mag hydroxide-simeth, aspirin-acetaminophen-caffeine, chlordiazePOXIDE, clonazePAM, hydrOXYzine, loperamide, magnesium hydroxide, ondansetron, DISCONTD: ALPRAZolam, DISCONTD: ALPRAZolam  Plan Increase Abilify to 5mg  at hs. Zettie Gootee,MICKIE D. 10/11/2012

## 2012-10-11 NOTE — Progress Notes (Signed)
Pt presents with anxious, blunted affect with congruent mood. She has frequent needs and is somatic. Initiates contact frequently with staff but is apprehensive when doing so. She endorses passive SI but is able to contract for safety while on unit. Attended evening group. Emotional support and reassurance given. Pt given xanax prn for sleep which was effective. She denies pain or problems. No HI/AVH. [name]

## 2012-10-11 NOTE — Progress Notes (Signed)
Pt remains flat, depressed in affect and mood. Expresses hopelessness and helplessness. Was given MOM and klonopin prior to this writer's shift. No BM at this time but klonopin helpful. Frequent needs. Pt given support, encouragement. Pt continues to ruminate on her present situation and is minimally receptive to suggestions for coping. Denies SI/HI/AVH at present. [name]

## 2012-10-12 DIAGNOSIS — F332 Major depressive disorder, recurrent severe without psychotic features: Secondary | ICD-10-CM

## 2012-10-12 LAB — GLUCOSE, CAPILLARY: Glucose-Capillary: 157 mg/dL — ABNORMAL HIGH (ref 70–99)

## 2012-10-12 NOTE — Progress Notes (Addendum)
D- Patient reports fair sleep and good appetite. Her energy level is low and her ability to pay attention is poor. She rates her depression a 7 and her hopelessness a 6. She denies SI/HI/AVH, contracts for safety. Pt c/o migraine pain 6 out of 10.   A-Talked with patient about not attending group.  Excedrin migraine given to pt.  R- Pt states that she will try and attend groups this evening.  Pt calm and cooperative with staff.  Headache went down to 2 out of 10.

## 2012-10-12 NOTE — Progress Notes (Signed)
[facility] MD Progress Note  10/12/2012 5:13 PM  Diagnosis:  Assessment:  AXIS I: Major Depression, Recurrent severe  AXIS II: Deferred  AXIS III:  Past Medical History   Diagnosis  Date   .  Hypertension    .  Diabetes mellitus    .  Arthritis    .  Depression    .  Anxiety    .  Headache    AXIS IV: economic problems, housing problems, other psychosocial or environmental problems and problems with primary support group  AXIS V: 21-30 behavior considerably influenced by delusions or hallucinations OR serious impairment in judgment, communication OR inability to function in almost all areas   ADL's:  Intact  Sleep: Fair  Appetite:  Fair  Suicidal Ideation:  Denies suicidal ideation, intent, plans, means or access. Homicidal Ideation:  Denies homicidal ideation, intent, plans, means or access  AEB (as evidenced by):  Mental Status Examination/Evaluation: Objective:  Appearance: Casual  Eye Contact::  Good  Speech:  Clear and Coherent  Volume:  Normal  Mood:  Anxious and Depressed  Affect:  Congruent  Thought Process:  Coherent  Orientation:  Full  Thought Content:  WDL  Suicidal Thoughts:  No  Homicidal Thoughts:  No  Memory:  Immediate;   Fair  Judgement:  Fair  Insight:  Fair  Psychomotor Activity:  Normal  Concentration:  Fair  Recall:  Fair  Akathisia:  No  Handed:  Right  AIMS (if indicated):     Assets:  Communication Skills Desire for Improvement  Sleep:  Number of Hours: 5.25    Vital Signs:Blood pressure 131/83, pulse 79, temperature 98.6 F (37 C), temperature source Oral, resp. rate 18, height 5\' 9"  (1.753 m), weight 92.534 kg (204 lb). Current Medications: Current Facility-Administered Medications  Medication Dose Route Frequency Provider Last Rate Last Dose  . acetaminophen (TYLENOL) tablet 650 mg  650 mg Oral Q6H PRN Shuvon Rankin, NP      . ALPRAZolam (XANAX) tablet 0.5 mg  0.5 mg Oral QHS PRN Mickie D. Adams, PA   0.5 mg at 10/12/12 0119  . alum  & mag hydroxide-simeth (MAALOX/MYLANTA) 200-200-20 MG/5ML suspension 30 mL  30 mL Oral Q4H PRN Shuvon Rankin, NP   30 mL at 10/10/12 2145  . ARIPiprazole (ABILIFY) tablet 5 mg  5 mg Oral QHS Mickie D. Adams, PA   5 mg at 10/11/12 2208  . aspirin-acetaminophen-caffeine (EXCEDRIN MIGRAINE) per tablet 2 tablet  2 tablet Oral Once Shuvon Rankin, NP      . aspirin-acetaminophen-caffeine (EXCEDRIN MIGRAINE) per tablet 2 tablet  2 tablet Oral Q6H PRN Mickie D. Adams, PA   2 tablet at 10/12/12 1603  . chlordiazePOXIDE (LIBRIUM) capsule 25 mg  25 mg Oral Q6H PRN Verne Spurr, PA-C      . clonazePAM (KLONOPIN) tablet 1 mg  1 mg Oral TID PRN Mickie D. Adams, PA   1 mg at 10/12/12 0919  . estradiol (ESTRACE) tablet 2 mg  2 mg Oral Daily Shuvon Rankin, NP   2 mg at 10/12/12 0915  . furosemide (LASIX) tablet 20 mg  20 mg Oral Daily Shuvon Rankin, NP   20 mg at 10/12/12 0917  . hydrochlorothiazide (HYDRODIURIL) tablet 25 mg  25 mg Oral Daily Mike Craze, MD      . hydrOXYzine (ATARAX/VISTARIL) tablet 25 mg  25 mg Oral Q6H PRN Verne Spurr, PA-C   25 mg at 10/10/12 0220  . lamoTRIgine (LAMICTAL) tablet 50 mg  50 mg  Oral Daily Shuvon Rankin, NP   50 mg at 10/12/12 0916  . loperamide (IMODIUM) capsule 2-4 mg  2-4 mg Oral PRN Verne Spurr, PA-C      . magnesium hydroxide (MILK OF MAGNESIA) suspension 30 mL  30 mL Oral Daily PRN Shuvon Rankin, NP   30 mL at 10/11/12 1814  . medroxyPROGESTERone (PROVERA) tablet 2.5 mg  2.5 mg Oral Daily Shuvon Rankin, NP   2.5 mg at 10/12/12 0917  . metoprolol tartrate (LOPRESSOR) tablet 25 mg  25 mg Oral BID Shuvon Rankin, NP   25 mg at 10/12/12 1704  . multivitamin with minerals tablet 1 tablet  1 tablet Oral Daily Verne Spurr, PA-C   1 tablet at 10/10/12 0811  . ondansetron (ZOFRAN-ODT) disintegrating tablet 4 mg  4 mg Oral Q6H PRN Verne Spurr, PA-C      . sulfamethoxazole-trimethoprim (BACTRIM DS) 800-160 MG per tablet 1 tablet  1 tablet Oral Q12H Mickie D. Adams, PA   1  tablet at 10/12/12 0916  . SUMAtriptan (IMITREX) injection 6 mg  6 mg Subcutaneous Once Wonda Cerise, MD   6 mg at 10/11/12 2221  . thiamine (B-1) injection 100 mg  100 mg Intramuscular Once [employer], PA-C      . thiamine (VITAMIN B-1) tablet 100 mg  100 mg Oral Daily Verne Spurr, PA-C   100 mg at 10/12/12 0915  . venlafaxine XR (EFFEXOR-XR) 24 hr capsule 150 mg  150 mg Oral Daily Shuvon Rankin, NP   150 mg at 10/12/12 0915    Lab Results: No results found for this or any previous visit (from the past 48 hour(s)).  Physical Findings: AIMS: Facial and Oral Movements Muscles of Facial Expression: None, normal Lips and Perioral Area: None, normal Jaw: None, normal Tongue: None, normal,Extremity Movements Upper (arms, wrists, hands, fingers): None, normal Lower (legs, knees, ankles, toes): None, normal, Trunk Movements Neck, shoulders, hips: None, normal, Overall Severity Severity of abnormal movements (highest score from questions above): None, normal Incapacitation due to abnormal movements: None, normal Patient's awareness of abnormal movements (rate only patient's report): No Awareness,    CIWA:  CIWA-Ar Total: 1  COWS:     Treatment Plan Summary: Daily contact with patient to assess and evaluate symptoms and progress in treatment Medication management  Plan: 1. Continue current plan of care without changes at this time. 2. Patient to discuss with CM a plan of care for follow up at [facility] in [place] upon discharge. 3. Will continue to follow.  10/12/2012, 5:13 PM

## 2012-10-12 NOTE — Progress Notes (Signed)
Patient attended MHT group. Writer used the therapeutic ball and physical activity to talk about issues and to get to know peers. 

## 2012-10-12 NOTE — Discharge Planning (Signed)
[name] attended AM group-hopeless, helpless.  Currently has no provider, and there is a rather lengthy list of providers who are no longer willing to work with her.  Wants referral to another provider.  Will be challenging.

## 2012-10-12 NOTE — Progress Notes (Signed)
Group Note  Date:  10/12/2012 Time:  1:15  Group Topic/Focus:  Overcoming Obstacles to Wellness  Participation Level:  Active  Participation Quality:  Appropriate  Affect:  Appropriate  Cognitive:  Appropriate  Insight:  Good  Engagement in Group:  Good  Additional Comments:  [name] participated actively in group. She answered questions, appropriately responded to others, and shared her own experiences. When the conversation moved toward grief, she shared about a loved one she had lost and that she is looking forward to tomorrow's grief group.  Nilton Lave S 10/12/2012, 2:47 PM

## 2012-10-12 NOTE — Progress Notes (Signed)
Patient ID: [name], female   DOB: July 03, 1966, 46 y.o.   MRN: [id number] D- Patient reports fair sleep and good appetite.  Her energy level is low and her ability to pay attention is  poor.  She rates her depression a 7 and her hopelessness a 6.  She contracts for safety.  A-Talked with patient about not attending group. R- Patient says she went to group but started feeling dizzy and went to lie down.  Patient took clonopin for anxiety with relief this am

## 2012-10-12 NOTE — Progress Notes (Signed)
I agree with this note.  

## 2012-10-12 NOTE — Progress Notes (Signed)
Psychoeducational Group Note  Date:  10/12/2012 Time:  2000  Group Topic/Focus:  Wrap-Up Group:   The focus of this group is to help patients review their daily goal of treatment and discuss progress on daily workbooks.  Participation Level:  Active  Participation Quality:  Appropriate  Affect:  Appropriate  Cognitive:  Appropriate  Insight:  Good  Engagement in Group:  Good  Additional Comments:  Patient was appropriate during group. Patient stated that her goal was to get rid of her ongoing headache and speak with the doctor tomorrow.   Lyndee Hensen 10/12/2012, 11:57 PM

## 2012-10-13 DIAGNOSIS — F39 Unspecified mood [affective] disorder: Principal | ICD-10-CM

## 2012-10-13 DIAGNOSIS — F1310 Sedative, hypnotic or anxiolytic abuse, uncomplicated: Secondary | ICD-10-CM

## 2012-10-13 DIAGNOSIS — F1994 Other psychoactive substance use, unspecified with psychoactive substance-induced mood disorder: Secondary | ICD-10-CM

## 2012-10-13 DIAGNOSIS — F411 Generalized anxiety disorder: Secondary | ICD-10-CM

## 2012-10-13 DIAGNOSIS — F1320 Sedative, hypnotic or anxiolytic dependence, uncomplicated: Secondary | ICD-10-CM | POA: Diagnosis present

## 2012-10-13 MED ORDER — ARIPIPRAZOLE 5 MG PO TABS: 5.0000 mg | ORAL_TABLET | Freq: Every day | ORAL | Status: DC

## 2012-10-13 MED ORDER — VENLAFAXINE HCL ER 150 MG PO CP24: 150.0000 mg | ORAL_CAPSULE | Freq: Every day | ORAL | Status: DC

## 2012-10-13 MED ORDER — FUROSEMIDE 20 MG PO TABS: 20.0000 mg | ORAL_TABLET | Freq: Every day | ORAL | Status: DC

## 2012-10-13 MED ORDER — SULFAMETHOXAZOLE-TMP DS 800-160 MG PO TABS: 1.0000 | ORAL_TABLET | Freq: Two times a day (BID) | ORAL | Status: DC

## 2012-10-13 NOTE — Progress Notes (Addendum)
D: PAtient in day room on approach.  Patient interacting with peers playing a board game on approach.  Patient states she has had anxiety today.  PAtient states she is excited because she has never had and outpatient pychiatrist and now she is going to see Dr. Dan Humphreys in [place].  Patient states she enjoyed playing volleyball today.  Patient states she is trying to attend all of her groups.  PAtient denies SI/Hi and denies AVH. A: Staff to monitor Q 15 mins for safety.  Encouragement and support offered.  Scheduled medications administered per orders.  Excedrin migraine administered prn for a headache pt felt was getting worse. Xanax given prn for anxiety and to help her sleep R: Patient remains safe on the unit.  Patient cooperative and pleasant.  Patient attended group tonight and has ben interacting with peers.  Patient taking administered medications.

## 2012-10-13 NOTE — Treatment Plan (Signed)
Interdisciplinary Treatment Plan Update (Adult)  Date: 10/13/2012  Time Reviewed: 10:46 AM   Progress in Treatment: Attending groups: Yes Participating in groups: Yes Taking medication as prescribed: Yes Tolerating medication: Yes   Family/Significant other contact made:  Yes Patient understands diagnosis:  Yes  As evidenced by asking for help with mood stabilization Discussing patient identified problems/goals with staff:  Yes  See below Medical problems stabilized or resolved:  Yes Denies suicidal/homicidal ideation: Yes  In tx team Issues/concerns per patient self-inventory:  Yes  Depression 4, hopelessness 5  C/O headaches Other:  New problem(s) identified: N/A  Reason for Continuation of Hospitalization: Depression Medication stabilization  Interventions implemented related to continuation of hospitalization: Abilify trial,  Encourage group attendance and participation  Additional comments:  Estimated length of stay: 1 day  Discharge Plan: return home, follow up outpt New goal(s): N/A  Review of initial/current patient goals per problem list:   1.  Goal(s):Eliminate SI  Met:  Yes  Target date:10/15  As evidenced AV:WUJW report  2.  Goal (s): Stabilize mood  Met:  No  Target date:10/16  As evidenced by:Izzabelle will rate her depression and hopelessness at a 3 or less  3.  Goal(s): Find an outpt provider  Met:  Yes  Target date:10/15  As evidenced by: confirmation of date and time of appointment  4.  Goal(s):  Met:  Yes  Target date:  As evidenced by:  Attendees: Patient:  [name] 10/13/2012 10:46 AM  Family:     Physician:  Orson Aloe 10/13/2012 10:46 AM   Nursing:  Angeline Slim  10/13/2012 10:46 AM   Case Manager:  Richelle Ito 10/13/2012 10:46 AM   Counselor:  Angus Palms 10/13/2012 10:46 AM   Other:     Other:     Other:     Other:      Scribe for Treatment Team:   Ida Rogue, 10/13/2012 10:46 AM

## 2012-10-13 NOTE — Progress Notes (Signed)
[facility] Group Notes: (Counselor/Nursing/MHT/Case Management/Adjunct) 10/13/2012   @ 1:15-2:30PM Feelings About Diagnosis  Type of Therapy:  Group Therapy  Participation Level:  Active  Participation Quality:  Appropriate, Sharing  Affect:  Appropriate  Cognitive:  Appropriate  Insight:  Limited  Engagement in Group: Good  Engagement in Therapy:  Good  Modes of Intervention:  Support and Exploration  Summary of Progress/Problems: [name] explored her thoughts and feelings about her diagnosis, stating that she was relieved to learn about the reason for her symptoms. She indicated that she believes her diagnosis is more Borderline Personality than the Bipolar Disorder she is taking medications for, but acknowledged that the medications can and do seem to help both. [name] processed her family's statements about her only attempting suicide for attention, and stated that while it probably is for attention she does not recognize that in the moment, only feels so desperate to make the pain stop. She also shared about statements made that she probably threw herself down the stairs on purpose, when she really tripped and fell. She discussed her feelings about being seen as less than others, and stated that sometimes she sees herself that way as well.  Angus Palms, LCSW 10/13/2012  2:37 PM

## 2012-10-13 NOTE — Progress Notes (Signed)
D: Patient resting in bed with eyes closed.  Respirations even and unlabored.  Patient appears to be in no apparent distress. A: Staff to monitor Q 15 mins for safety.   R:Patient remains safe on the unit.  

## 2012-10-13 NOTE — Progress Notes (Signed)
Psychoeducational Group Note  Date:  10/13/2012 Time:  1100  Group Topic/Focus:  Educational video  Participation Level:  Active  Participation Quality:  Appropriate  Affect:  Appropriate  Cognitive:  Appropriate  Insight:  Good  Engagement in Group:  Good  Additional Comments:  Pt participated in group.  Marquis Lunch, Daneille Desilva 10/13/2012, 5:11 PM

## 2012-10-13 NOTE — Progress Notes (Signed)
Psychoeducational Group Note  Date:  10/13/2012 Time:  1000  Group Topic/Focus:  Self Care:   The focus of this group is to help patients understand the importance of self-care in order to improve or restore emotional, physical, spiritual, interpersonal, and financial health.  Participation Level:  Active  Participation Quality:  Appropriate, Attentive, Sharing and Supportive  Affect:  Depressed  Cognitive:  Alert and Appropriate  Insight:  Good  Engagement in Group:  Good  Additional Comments:  Very active and supportive of others  Ralph Leyden 10/13/2012, 1:11 PM

## 2012-10-13 NOTE — Progress Notes (Signed)
Psychoeducational Group Note  Date:  10/13/2012 Time:  2000  Group Topic/Focus:  Wrap-Up Group:   The focus of this group is to help patients review their daily goal of treatment and discuss progress on daily workbooks.  Participation Level:  Active  Participation Quality:  Appropriate  Affect:  Appropriate  Cognitive:  Appropriate  Insight:  Good  Engagement in Group:  Good  Additional Comments:  Patient was appropriate during group. Patient shared several of her personal experiences and appeared to be in a happy mood.   Rodman Pickle R 10/13/2012, 11:18 PM
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
## 2012-10-13 NOTE — Progress Notes (Signed)
D:  Pt states "she slept well . Pt rates depression "4", hopelessness "5", out of scale 1-10.Pt has Complains anxiety is  6 out of 10. Pt denies SI/HI/AV. Pt is pleasant, and cooperative. Pt "pain goal for the day is 2. Pt attends groups. Pt states " she can't trust people, I get anxious around people, I think they are judging me, I may say or do the wrong thing, and or they may call me stupid" Pt denied Flu/Pneumococcal vaccine. Pt talked with nurse while revealing increasing pt level of comfort with communication. Pt states " she still feels hopeless, because when she goes home she only has he 50 yr old daughter at home & she may feel depressed and lonely".   A: . Pt was offered support and encouragement. Pt was given scheduled medications. Pt given PRN Klonopin for Anxiety. Pt was encourage to attend groups. Q 15 minute checks were done for safety. Pt advised to utilize coping mechanisms learned,  [employer], and friends when feelings of depression or anxiety start to materialize.  R: . Pt attends groups and interacts well with peers and staff. Pt is taking medication as prescribed.Pt receptive to treatment and safety maintained on unit. Pt states "  anxiety has remained at 5 (which is her baseline) after receiving medication (klonipin) and attending group". Pt states " getting good information to help with anxiety ". Pt states "she will utilize coping mechanisms learned,  Community resources, and friends when feelings of depression or anxiety start to materialize".

## 2012-10-14 MED ORDER — CLONAZEPAM 1 MG PO TABS: 1.0000 mg | ORAL_TABLET | Freq: Three times a day (TID) | ORAL | Status: DC | PRN

## 2012-10-14 MED ORDER — GABAPENTIN 300 MG PO CAPS: 300.0000 mg | ORAL_CAPSULE | Freq: Three times a day (TID) | ORAL | Status: DC

## 2012-10-14 MED ORDER — METOPROLOL TARTRATE 25 MG PO TABS: 25.0000 mg | ORAL_TABLET | Freq: Two times a day (BID) | ORAL | Status: DC

## 2012-10-14 NOTE — [facility] Suicide Risk Assessment (Signed)
Suicide Risk Assessment  Discharge Assessment     Current Mental Status by Physician: Patient denies suicidal or homicidal ideation, hallucinations, illusions, or delusions. Patient engages with good eye contact, is able to focus adequately in a one to one setting, and has clear goal directed thoughts. Patient speaks with a natural conversational volume, rate, and tone. Anxiety was reported at 5 on a scale of 1 the least and 10 the most. Depression was reported at 4 on the same scale. Patient is oriented times 4, recent and remote memory intact. Judgement: improved from admission Insight: improved from admission  No change from prior assessment.  Considerable discussion took place about her deciding to actually do the things that she says that she wants to do.  Specific steps given to her about how to set progressive expectations for her children and for herself.  She seemed to listen to these.  She reports that her memory is bad.  This is due to the Klonopin.  Will be working on the  Outpatient basis getting her off that.  Will have her write down her accomplishments and her goals to help her with her memory.   [name], [name] 10/14/2012 12:27 PM

## 2012-10-14 NOTE — Progress Notes (Signed)
D: Pt states "her sleep was fair . Pt rates depression "4", hopelessness "2", out of scale 1-10.Pt denies SI/HI/AV. Pt states "Anxiety is 7 out of 10". Pt says " she feels good, she has slight H/A with rating being 2 out of 10". Pt "worried about leaving today, want to leave in the early afternoon, and not at night when anxiety goes up."  A: Pt given PRN Klonopin for anxiety. Pt offered Pain medication for H/A. Pt was offered support and encouragement. Pt was given scheduled medications. Pt was encourage to attend groups. Q 15 minute checks were done for safety. Kept pt informed of discharge status, to decrease anxiety.  R: Pt stated " anxiety decreased from 7 to 5 which is her normal baseline". Pt refused Pain medication for H/A. Pt attends groups and interacts well with peers and staff. Pt is taking medication as prescribed.Pt receptive to treatment and safety maintained on unit. Pt states " getting good information to help with coping". Pt is cooperative and pleasant. Pt seems more at ease when informed about discharge status.

## 2012-10-14 NOTE — Progress Notes (Signed)
[facility] Adult Inpatient Family/Significant Other Suicide Prevention Education  Suicide Prevention Education:  Patient Refusal for Family/Significant Other Suicide Prevention Education: The patient [name] has refused to provide written consent for family/significant other to be provided Family/Significant Other Suicide Prevention Education during admission and/or prior to discharge.  Physician notified.  Brooke-Lynn stated that her family and friends already know about suicide prevention. She accepted a suicide prevention pamphlet and verbalized understanding of the information therein. She denies any access to guns or other weapons.   [name] 10/14/2012, 2:41 PM
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
## 2012-10-14 NOTE — Progress Notes (Signed)
[facility] Case Management Discharge Plan:  Will you be returning to the same living situation after discharge: Yes,  home At discharge, do you have transportation home?:Yes,  has transportation here Do you have the ability to pay for your medications:Yes,  insurance  Interagency Information:     Release of information consent forms completed and in the chart;  Patient's signature needed at discharge.  Patient to Follow up at:  Follow-up Information    Follow up with [facility]. On 11/02/2012. (11:00 with Dr Dan Humphreys.  If you have already filled out the packet, arrive at 10:45.  If not, arrive an hour before your appointment)    Contact information:   [address]  [address]  [place]  [place]  [phone]         Patient denies SI/HI:   Yes,  yes    Safety Planning and Suicide Prevention discussed:  Yes,  yes  Barrier to discharge identified:No.  Summary and Recommendations:   [name] 10/14/2012, 11:08 AM

## 2012-10-14 NOTE — Progress Notes (Signed)
Pt discharged to home.  Discharge instructions both verbal and written to pt  with verbalization of understanding.  Discharge instructions to include medications, follow up care, suicide safety prevention, and community resource list.  All belongings in pts possession and signed for.  Dr. Dan Humphreys was able to talk to pt  prior to discharge answering any questions or concerns.   Denies HI/SI, auditory or visual hallucinations on discharge.  Pt excited and ready for discharge, with no further questions.  Pt  escorted to lobby for discharge.

## 2012-10-14 NOTE — Progress Notes (Signed)
Psychoeducational Group Note  Date:  10/14/2012 Time: 1000  Group Topic/Focus:  Personal Development- Wisdom Cards Activity  Participation Level:  Active  Participation Quality:  Appropriate, Attentive and Sharing  Affect:  Appropriate  Cognitive:  Appropriate  Insight:  Good  Engagement in Group:  Good  Additional Comments: Patient participated in Wisdom cards activity. Pt was given two random cards and stated what the card said and how it applied in her life or how it could become apart of her life. This activity promotes positive aspects of how pt should view different view points for their life and how changes could be made if necessary. This relates to personal development with having positive coping skills with daily tasks or situations that occur within pt live.    Karleen Hampshire Brittini 10/14/2012, 11:48 AM

## 2012-10-15 DIAGNOSIS — E785 Hyperlipidemia, unspecified: Secondary | ICD-10-CM | POA: Diagnosis not present

## 2012-10-15 DIAGNOSIS — I10 Essential (primary) hypertension: Secondary | ICD-10-CM | POA: Diagnosis not present

## 2012-10-15 DIAGNOSIS — E119 Type 2 diabetes mellitus without complications: Secondary | ICD-10-CM | POA: Diagnosis not present

## 2012-10-16 NOTE — Progress Notes (Signed)
Patient Discharge Instructions:  After Visit Summary (AVS):   Faxed to:  10/16/2012 Access to EMR:  10/16/2012 Face Sheet:   Faxed to:  10/16/2012 Access to EMR:  10/16/2012 Psychiatric Admission Assessment Note:   Faxed to:  10/16/2012 Access to EMR:  10/16/2012 Suicide Risk Assessment - Discharge Assessment:   Faxed to:  10/16/2012 Access to EMR:  10/16/2012 Faxed/Sent to the Next Level Care provider:  10/16/2012 Next Level Care Provider Has Access to the EMR, 10/16/2012  Records provided to [facility] - Dr. Dan Humphreys via [facility]/Epic access And faxed to [facility] @ [phone]  Wandra Scot, 10/16/2012, 4:38 PM

## 2012-10-29 DIAGNOSIS — E785 Hyperlipidemia, unspecified: Secondary | ICD-10-CM | POA: Diagnosis not present

## 2012-10-29 DIAGNOSIS — E119 Type 2 diabetes mellitus without complications: Secondary | ICD-10-CM | POA: Diagnosis not present

## 2012-10-29 DIAGNOSIS — I10 Essential (primary) hypertension: Secondary | ICD-10-CM | POA: Diagnosis not present

## 2012-11-02 ENCOUNTER — Ambulatory Visit (INDEPENDENT_AMBULATORY_CARE_PROVIDER_SITE_OTHER): Payer: MEDICAID | Admitting: Psychiatry

## 2012-11-02 ENCOUNTER — Encounter (HOSPITAL_COMMUNITY): Payer: Self-pay | Admitting: Psychiatry

## 2012-11-02 VITALS — BP 138/90 | HR 84 | Ht 64.75 in | Wt 205.0 lb

## 2012-11-02 DIAGNOSIS — T7432XA Child psychological abuse, confirmed, initial encounter: Secondary | ICD-10-CM

## 2012-11-02 DIAGNOSIS — IMO0001 Reserved for inherently not codable concepts without codable children: Secondary | ICD-10-CM

## 2012-11-02 DIAGNOSIS — T7422XA Child sexual abuse, confirmed, initial encounter: Secondary | ICD-10-CM

## 2012-11-02 DIAGNOSIS — F410 Panic disorder [episodic paroxysmal anxiety] without agoraphobia: Secondary | ICD-10-CM

## 2012-11-02 DIAGNOSIS — F332 Major depressive disorder, recurrent severe without psychotic features: Secondary | ICD-10-CM

## 2012-11-02 DIAGNOSIS — F603 Borderline personality disorder: Secondary | ICD-10-CM

## 2012-11-02 MED ORDER — RISPERIDONE 0.25 MG PO TABS: 0.2500 mg | ORAL_TABLET | Freq: Two times a day (BID) | ORAL | Status: DC

## 2012-11-02 MED ORDER — CLONAZEPAM 1 MG PO TABS: 0.5000 mg | ORAL_TABLET | Freq: Two times a day (BID) | ORAL | Status: DC | PRN

## 2012-11-02 MED ORDER — ARIPIPRAZOLE 2 MG PO TABS: 2.0000 mg | ORAL_TABLET | Freq: Every day | ORAL | Status: DC

## 2012-11-02 MED ORDER — VENLAFAXINE HCL ER 150 MG PO CP24: 150.0000 mg | ORAL_CAPSULE | Freq: Every day | ORAL | Status: DC

## 2012-11-02 MED ORDER — LAMOTRIGINE 25 MG PO TABS: 50.0000 mg | ORAL_TABLET | Freq: Every day | ORAL | Status: DC
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
## 2012-11-02 NOTE — Patient Instructions (Signed)
Try adjusting to a dose of Risperdal.  Keep getting out and going to volunteer things.

## 2012-11-30 ENCOUNTER — Other Ambulatory Visit: Payer: Self-pay | Admitting: Obstetrics & Gynecology

## 2012-11-30 DIAGNOSIS — Z01419 Encounter for gynecological examination (general) (routine) without abnormal findings: Secondary | ICD-10-CM | POA: Diagnosis not present

## 2012-12-03 ENCOUNTER — Encounter (HOSPITAL_COMMUNITY): Payer: Self-pay | Admitting: Psychiatry

## 2012-12-03 ENCOUNTER — Ambulatory Visit (INDEPENDENT_AMBULATORY_CARE_PROVIDER_SITE_OTHER): Payer: MEDICAID | Admitting: Psychiatry

## 2012-12-03 VITALS — BP 140/86 | HR 68 | Wt 210.2 lb

## 2012-12-03 DIAGNOSIS — T7422XA Child sexual abuse, confirmed, initial encounter: Secondary | ICD-10-CM

## 2012-12-03 DIAGNOSIS — F339 Major depressive disorder, recurrent, unspecified: Secondary | ICD-10-CM

## 2012-12-03 DIAGNOSIS — F603 Borderline personality disorder: Secondary | ICD-10-CM

## 2012-12-03 DIAGNOSIS — R45851 Suicidal ideations: Secondary | ICD-10-CM

## 2012-12-03 DIAGNOSIS — F429 Obsessive-compulsive disorder, unspecified: Secondary | ICD-10-CM | POA: Insufficient documentation

## 2012-12-03 DIAGNOSIS — F410 Panic disorder [episodic paroxysmal anxiety] without agoraphobia: Secondary | ICD-10-CM

## 2012-12-03 DIAGNOSIS — F1320 Sedative, hypnotic or anxiolytic dependence, uncomplicated: Secondary | ICD-10-CM

## 2012-12-03 DIAGNOSIS — F1994 Other psychoactive substance use, unspecified with psychoactive substance-induced mood disorder: Secondary | ICD-10-CM

## 2012-12-03 DIAGNOSIS — T7432XA Child psychological abuse, confirmed, initial encounter: Secondary | ICD-10-CM

## 2012-12-03 MED ORDER — CLONAZEPAM 1 MG PO TABS: 0.5000 mg | ORAL_TABLET | Freq: Two times a day (BID) | ORAL | Status: DC | PRN

## 2012-12-03 MED ORDER — ARIPIPRAZOLE 2 MG PO TABS: 2.0000 mg | ORAL_TABLET | Freq: Every day | ORAL | Status: DC

## 2012-12-03 MED ORDER — LAMOTRIGINE 25 MG PO TABS: 50.0000 mg | ORAL_TABLET | Freq: Every day | ORAL | Status: DC

## 2012-12-03 MED ORDER — VENLAFAXINE HCL ER 150 MG PO CP24: 150.0000 mg | ORAL_CAPSULE | Freq: Every day | ORAL | Status: DC

## 2012-12-03 NOTE — Patient Instructions (Signed)
Cut back on sugar and carbohydrates, that means very limited fruits and starchy vegetables and very limited grains, breads  The goal is low GLYCEMIC INDEX.  Cut back on all wheat, rye, or barley  Eat avocados, eggs, lean meat like grass fed beef and chicken
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
## 2012-12-29 DIAGNOSIS — J019 Acute sinusitis, unspecified: Secondary | ICD-10-CM | POA: Diagnosis not present

## 2013-01-04 ENCOUNTER — Other Ambulatory Visit (HOSPITAL_COMMUNITY): Payer: Self-pay | Admitting: Psychiatry

## 2013-01-06 ENCOUNTER — Telehealth (HOSPITAL_COMMUNITY): Payer: Self-pay | Admitting: Psychiatry

## 2013-01-06 DIAGNOSIS — F1994 Other psychoactive substance use, unspecified with psychoactive substance-induced mood disorder: Secondary | ICD-10-CM

## 2013-01-06 MED ORDER — LAMOTRIGINE 25 MG PO TABS: 50.0000 mg | ORAL_TABLET | Freq: Every day | ORAL | Status: DC

## 2013-01-06 NOTE — Telephone Encounter (Signed)
Request satisfied via eScripts  

## 2013-01-07 ENCOUNTER — Telehealth (HOSPITAL_COMMUNITY): Payer: Self-pay | Admitting: Psychiatry

## 2013-01-07 DIAGNOSIS — F1994 Other psychoactive substance use, unspecified with psychoactive substance-induced mood disorder: Secondary | ICD-10-CM

## 2013-01-07 MED ORDER — LAMOTRIGINE 25 MG PO TABS: ORAL_TABLET | ORAL | Status: DC

## 2013-01-07 NOTE — Telephone Encounter (Signed)
Script had printed because instructions too long.  Sent this time and am pretty sure it went though.

## 2013-01-08 ENCOUNTER — Encounter (HOSPITAL_COMMUNITY): Payer: Self-pay

## 2013-01-08 ENCOUNTER — Telehealth (HOSPITAL_COMMUNITY): Payer: Self-pay | Admitting: Psychiatry

## 2013-01-08 ENCOUNTER — Emergency Department (HOSPITAL_COMMUNITY)
Admission: EM | Admit: 2013-01-08 | Discharge: 2013-01-09 | Disposition: A | Discharge status: Other Acute Inpatient Hospital | Payer: Medicare Other | Source: Home / Self Care | Attending: Emergency Medicine | Admitting: Emergency Medicine

## 2013-01-08 DIAGNOSIS — F411 Generalized anxiety disorder: Secondary | ICD-10-CM | POA: Insufficient documentation

## 2013-01-08 DIAGNOSIS — F410 Panic disorder [episodic paroxysmal anxiety] without agoraphobia: Secondary | ICD-10-CM | POA: Insufficient documentation

## 2013-01-08 DIAGNOSIS — M129 Arthropathy, unspecified: Secondary | ICD-10-CM | POA: Insufficient documentation

## 2013-01-08 DIAGNOSIS — F603 Borderline personality disorder: Secondary | ICD-10-CM | POA: Insufficient documentation

## 2013-01-08 DIAGNOSIS — Z79899 Other long term (current) drug therapy: Secondary | ICD-10-CM | POA: Insufficient documentation

## 2013-01-08 DIAGNOSIS — F429 Obsessive-compulsive disorder, unspecified: Secondary | ICD-10-CM

## 2013-01-08 DIAGNOSIS — E119 Type 2 diabetes mellitus without complications: Secondary | ICD-10-CM | POA: Insufficient documentation

## 2013-01-08 DIAGNOSIS — I10 Essential (primary) hypertension: Secondary | ICD-10-CM | POA: Insufficient documentation

## 2013-01-08 DIAGNOSIS — F3289 Other specified depressive episodes: Secondary | ICD-10-CM | POA: Insufficient documentation

## 2013-01-08 DIAGNOSIS — F329 Major depressive disorder, single episode, unspecified: Secondary | ICD-10-CM | POA: Insufficient documentation

## 2013-01-08 DIAGNOSIS — Z8679 Personal history of other diseases of the circulatory system: Secondary | ICD-10-CM | POA: Insufficient documentation

## 2013-01-08 DIAGNOSIS — F339 Major depressive disorder, recurrent, unspecified: Secondary | ICD-10-CM | POA: Insufficient documentation

## 2013-01-08 DIAGNOSIS — F1994 Other psychoactive substance use, unspecified with psychoactive substance-induced mood disorder: Secondary | ICD-10-CM

## 2013-01-08 DIAGNOSIS — R45851 Suicidal ideations: Secondary | ICD-10-CM

## 2013-01-08 LAB — COMPREHENSIVE METABOLIC PANEL
AST: 30 U/L (ref 0–37)
CO2: 24 mEq/L (ref 19–32)
Calcium: 9.6 mg/dL (ref 8.4–10.5)
Creatinine, Ser: 0.71 mg/dL (ref 0.50–1.10)
GFR calc Af Amer: >90 mL/min (ref >90)
GFR calc non Af Amer: >90 mL/min (ref >90)
Glucose, Bld: 120 mg/dL — ABNORMAL HIGH (ref 70–99)

## 2013-01-08 LAB — CBC WITH DIFFERENTIAL/PLATELET
Basophils Absolute: 0.1 10*3/uL (ref 0.0–0.1)
Basophils Relative: 1 % (ref 0–1)
Eosinophils Absolute: 0.1 10*3/uL (ref 0.0–0.7)
Eosinophils Relative: 2 % (ref 0–5)
Lymphs Abs: 2.4 10*3/uL (ref 0.7–4.0)
MCH: 25.8 pg — ABNORMAL LOW (ref 26.0–34.0)
MCHC: 32.9 g/dL (ref 30.0–36.0)
MCV: 78.4 fL (ref 78.0–100.0)
Platelets: 225 10*3/uL (ref 150–400)
RDW: 14.5 % (ref 11.5–15.5)

## 2013-01-08 LAB — URINALYSIS, ROUTINE W REFLEX MICROSCOPIC
Glucose, UA: NEGATIVE mg/dL
Ketones, ur: NEGATIVE mg/dL
Leukocytes, UA: NEGATIVE
Protein, ur: NEGATIVE mg/dL
Urobilinogen, UA: 1 mg/dL (ref 0.0–1.0)

## 2013-01-08 LAB — GLUCOSE, CAPILLARY: Glucose-Capillary: 98 mg/dL (ref 70–99)

## 2013-01-08 LAB — RAPID URINE DRUG SCREEN, HOSP PERFORMED
Benzodiazepines: POSITIVE — AB
Cocaine: NOT DETECTED

## 2013-01-08 MED ORDER — MEDROXYPROGESTERONE ACETATE 2.5 MG PO TABS
2.5000 mg | ORAL_TABLET | Freq: Every day | ORAL | Status: DC
  Administered 2013-01-09: 2.5 mg via ORAL
  Filled 2013-01-08: qty 1

## 2013-01-08 MED ORDER — INSULIN ASPART 100 UNIT/ML ~~LOC~~ SOLN
0.0000 [IU] | Freq: Three times a day (TID) | SUBCUTANEOUS | Status: DC
  Filled 2013-01-08: qty 1

## 2013-01-08 MED ORDER — FUROSEMIDE 20 MG PO TABS
20.0000 mg | ORAL_TABLET | Freq: Every day | ORAL | Status: DC
  Administered 2013-01-09: 20 mg via ORAL
  Filled 2013-01-08: qty 1

## 2013-01-08 MED ORDER — CLONAZEPAM 0.5 MG PO TABS
0.5000 mg | ORAL_TABLET | Freq: Two times a day (BID) | ORAL | Status: DC | PRN
  Administered 2013-01-08 – 2013-01-09 (×2): 0.5 mg via ORAL
  Filled 2013-01-08 (×2): qty 1

## 2013-01-08 MED ORDER — LISINOPRIL 20 MG PO TABS
20.0000 mg | ORAL_TABLET | Freq: Every day | ORAL | Status: DC
  Administered 2013-01-09: 20 mg via ORAL
  Filled 2013-01-08: qty 1

## 2013-01-08 MED ORDER — BACITRACIN-NEOMYCIN-POLYMYXIN OINTMENT TUBE
TOPICAL_OINTMENT | Freq: Every day | CUTANEOUS | Status: DC
  Administered 2013-01-08: 1 via TOPICAL
  Administered 2013-01-09: 11:00:00 via TOPICAL
  Filled 2013-01-08: qty 15

## 2013-01-08 MED ORDER — RISPERIDONE 0.5 MG PO TABS
0.2500 mg | ORAL_TABLET | Freq: Two times a day (BID) | ORAL | Status: DC
  Administered 2013-01-08 – 2013-01-09 (×2): 0.25 mg via ORAL
  Filled 2013-01-08 (×2): qty 1

## 2013-01-08 MED ORDER — METOPROLOL TARTRATE 25 MG PO TABS
25.0000 mg | ORAL_TABLET | Freq: Two times a day (BID) | ORAL | Status: DC
  Administered 2013-01-08 – 2013-01-09 (×2): 25 mg via ORAL
  Filled 2013-01-08 (×2): qty 1

## 2013-01-08 MED ORDER — VENLAFAXINE HCL ER 150 MG PO CP24
150.0000 mg | ORAL_CAPSULE | Freq: Every day | ORAL | Status: DC
  Administered 2013-01-09: 150 mg via ORAL
  Filled 2013-01-08: qty 1

## 2013-01-08 MED ORDER — METFORMIN HCL 500 MG PO TABS
500.0000 mg | ORAL_TABLET | Freq: Two times a day (BID) | ORAL | Status: DC
  Administered 2013-01-08 – 2013-01-09 (×3): 500 mg via ORAL
  Filled 2013-01-08 (×4): qty 1

## 2013-01-08 MED ORDER — ARIPIPRAZOLE 2 MG PO TABS
2.0000 mg | ORAL_TABLET | Freq: Every day | ORAL | Status: DC
  Administered 2013-01-08 – 2013-01-09 (×2): 2 mg via ORAL
  Filled 2013-01-08 (×2): qty 1

## 2013-01-08 MED ORDER — ESTRADIOL 2 MG PO TABS
2.0000 mg | ORAL_TABLET | Freq: Every day | ORAL | Status: DC
Start: 2013-01-09 — End: 2013-01-09
  Administered 2013-01-09: 2 mg via ORAL
  Filled 2013-01-08: qty 1

## 2013-01-08 MED ORDER — LAMOTRIGINE 25 MG PO TABS
50.0000 mg | ORAL_TABLET | Freq: Two times a day (BID) | ORAL | Status: DC
  Administered 2013-01-08 – 2013-01-09 (×2): 50 mg via ORAL
  Filled 2013-01-08 (×3): qty 2

## 2013-01-08 NOTE — ED Provider Notes (Signed)
History     CSN: [id number]  Arrival date & time 01/08/13  1335   None     Chief Complaint  Patient presents with  . Suicidal  . Depression    (Consider location/radiation/quality/duration/timing/severity/associated sxs/prior treatment) HPI Comments: Discussion the ER for evaluation of depression. She reports that over the last couple of days she has been feeling much worse than usual. She is on medications for depression already and is not responding. She does have a history of depression with hospitalizations. Patient reports that she has been feeling suicidal. She does have a previous overdose attempt and feels that she will overdose again. She did Dr. today to come to the ER. She was definitely suicidal at time of arrival.   Past Medical History  Diagnosis Date  . Hypertension   . Diabetes mellitus   . Arthritis   . Depression   . Anxiety   . Headache   . History of borderline personality disorder     Past Surgical History  Procedure Date  . Foot surgery     Family History  Problem Relation Age of Onset  . Depression Mother   . OCD Other     History  Substance Use Topics  . Smoking status: Never Smoker   . Smokeless tobacco: Never Used  . Alcohol Use: No    OB History    Grav Para Term Preterm Abortions TAB SAB Ect Mult Living                  Review of Systems  Psychiatric/Behavioral: Positive for suicidal ideas and dysphoric mood.  All other systems reviewed and are negative.    Allergies  Neurontin and Trazodone and nefazodone  Home Medications   Current Outpatient Rx  Name  Route  Sig  Dispense  Refill  . ARIPIPRAZOLE 2 MG PO TABS   Oral   Take 1 tablet (2 mg total) by mouth daily.   30 tablet   0   . ASPIRIN-ACETAMINOPHEN-CAFFEINE 250-250-65 MG PO TABS   Oral   Take 2 tablets by mouth every 6 (six) hours as needed. For migraines.         Marland Kitchen BENZONATATE 100 MG PO CAPS   Oral   Take 100 mg by mouth 3 (three) times daily as needed.  For cough.         . CLONAZEPAM 1 MG PO TABS   Oral   Take 0.5 mg by mouth 2 (two) times daily as needed. For anxiety/insomnia.         Marland Kitchen ESTRADIOL 2 MG PO TABS   Oral   Take 1 tablet (2 mg total) by mouth daily. For estrogen replacement   30 tablet   0   . FUROSEMIDE 20 MG PO TABS   Oral   Take 1 tablet (20 mg total) by mouth daily.   30 tablet   0   . LAMOTRIGINE 25 MG PO TABS   Oral   Take 50 mg by mouth 2 (two) times daily.         Marland Kitchen LISINOPRIL 20 MG PO TABS   Oral   Take 20 mg by mouth daily.         Marland Kitchen MEDROXYPROGESTERONE ACETATE 2.5 MG PO TABS   Oral   Take 1 tablet (2.5 mg total) by mouth daily. For progesterone replacement   30 tablet   0   . METFORMIN HCL 500 MG PO TABS   Oral   Take 500 mg  by mouth 2 (two) times daily with a meal.         . METOPROLOL TARTRATE 25 MG PO TABS   Oral   Take 1 tablet (25 mg total) by mouth 2 (two) times daily.   60 tablet   0   . VENLAFAXINE HCL ER 150 MG PO CP24   Oral   Take 1 capsule (150 mg total) by mouth daily.   30 capsule   0   . RISPERIDONE 0.25 MG PO TABS   Oral   Take 1 tablet (0.25 mg total) by mouth 2 (two) times daily.   60 tablet   2     BP 129/75  Pulse 90  Temp 98.4 F (36.9 C) (Oral)  Resp 18  SpO2 97%  Physical Exam  Constitutional: She is oriented to person, place, and time. She appears well-developed and well-nourished. No distress.  HENT:  Head: Normocephalic and atraumatic.  Right Ear: Hearing normal.  Nose: Nose normal.  Mouth/Throat: Oropharynx is clear and moist and mucous membranes are normal.  Eyes: Conjunctivae normal and EOM are normal. Pupils are equal, round, and reactive to light.  Neck: Normal range of motion. Neck supple.  Cardiovascular: Normal rate, regular rhythm, S1 normal and S2 normal.  Exam reveals no gallop and no friction rub.   No murmur heard. Pulmonary/Chest: Effort normal and breath sounds normal. No respiratory distress. She exhibits no  tenderness.  Abdominal: Soft. Normal appearance and bowel sounds are normal. There is no hepatosplenomegaly. There is no tenderness. There is no rebound, no guarding, no tenderness at McBurney's point and negative Murphy's sign. No hernia.  Musculoskeletal: Normal range of motion.  Neurological: She is alert and oriented to person, place, and time. She has normal strength. No cranial nerve deficit or sensory deficit. Coordination normal. GCS eye subscore is 4. GCS verbal subscore is 5. GCS motor subscore is 6.  Skin: Skin is warm, dry and intact. No rash noted. No cyanosis.  Psychiatric: Her speech is normal. Judgment and thought content normal. She is slowed and withdrawn. Cognition and memory are normal. She exhibits a depressed mood.    ED Course  Procedures (including critical care time)  Labs Reviewed  CBC WITH DIFFERENTIAL - Abnormal; Notable for the following:    RBC 5.19 (*)     MCH 25.8 (*)     All other components within normal limits  URINALYSIS, ROUTINE W REFLEX MICROSCOPIC - Abnormal; Notable for the following:    APPearance CLOUDY (*)     All other components within normal limits  COMPREHENSIVE METABOLIC PANEL  URINE RAPID DRUG SCREEN (HOSP PERFORMED)   No results found.   Diagnosis: Depression    MDM  Patient comes to the ER for evaluation of increasing depression. Patient has a long history of depression and is feeling suicidal. She will require evaluation for placement.        Gilda Crease, MD 01/08/13 1455

## 2013-01-08 NOTE — ED Notes (Signed)
Pt's two great toes are very dry and calloused. The right great toe has a tear in the calloused area that's bleeding. Pt is Type II diabetic. Will notify PA.

## 2013-01-08 NOTE — ED Notes (Signed)
telepsych info called into [facility], there are about 4 ahead of this pt so it will probably be a couple hours per [facility] rep.

## 2013-01-08 NOTE — Telephone Encounter (Signed)
Phone message completed in the phone message section.  

## 2013-01-08 NOTE — ED Notes (Signed)
Pt states that she feels very depressed and wants to take an overdose. She states that she called Dr Dan Humphreys and was told to come to the ED for further evaluation

## 2013-01-09 ENCOUNTER — Inpatient Hospital Stay (HOSPITAL_COMMUNITY)
Admission: RE | Admit: 2013-01-09 | Discharge: 2013-01-12 | DRG: 885 | Disposition: A | Discharge status: Home / Self Care | Payer: Medicare Other | Source: Ambulatory Visit | Attending: Psychiatry | Admitting: Psychiatry

## 2013-01-09 ENCOUNTER — Encounter (HOSPITAL_COMMUNITY): Payer: Self-pay | Admitting: *Deleted

## 2013-01-09 DIAGNOSIS — E119 Type 2 diabetes mellitus without complications: Secondary | ICD-10-CM | POA: Diagnosis present

## 2013-01-09 DIAGNOSIS — F339 Major depressive disorder, recurrent, unspecified: Principal | ICD-10-CM

## 2013-01-09 DIAGNOSIS — Z79899 Other long term (current) drug therapy: Secondary | ICD-10-CM | POA: Diagnosis not present

## 2013-01-09 DIAGNOSIS — F4323 Adjustment disorder with mixed anxiety and depressed mood: Secondary | ICD-10-CM | POA: Diagnosis present

## 2013-01-09 DIAGNOSIS — F411 Generalized anxiety disorder: Secondary | ICD-10-CM | POA: Diagnosis not present

## 2013-01-09 DIAGNOSIS — I10 Essential (primary) hypertension: Secondary | ICD-10-CM | POA: Diagnosis present

## 2013-01-09 DIAGNOSIS — F329 Major depressive disorder, single episode, unspecified: Secondary | ICD-10-CM | POA: Diagnosis not present

## 2013-01-09 DIAGNOSIS — F410 Panic disorder [episodic paroxysmal anxiety] without agoraphobia: Secondary | ICD-10-CM

## 2013-01-09 DIAGNOSIS — R45851 Suicidal ideations: Secondary | ICD-10-CM

## 2013-01-09 DIAGNOSIS — F603 Borderline personality disorder: Secondary | ICD-10-CM

## 2013-01-09 DIAGNOSIS — F1994 Other psychoactive substance use, unspecified with psychoactive substance-induced mood disorder: Secondary | ICD-10-CM

## 2013-01-09 DIAGNOSIS — F4310 Post-traumatic stress disorder, unspecified: Secondary | ICD-10-CM | POA: Diagnosis not present

## 2013-01-09 DIAGNOSIS — F429 Obsessive-compulsive disorder, unspecified: Secondary | ICD-10-CM

## 2013-01-09 LAB — GLUCOSE, CAPILLARY: Glucose-Capillary: 107 mg/dL — ABNORMAL HIGH (ref 70–99)

## 2013-01-09 MED ORDER — MAGNESIUM HYDROXIDE 400 MG/5ML PO SUSP: 30.0000 mL | Freq: Every day | ORAL | Status: DC | PRN

## 2013-01-09 MED ORDER — ALUM & MAG HYDROXIDE-SIMETH 200-200-20 MG/5ML PO SUSP
30.0000 mL | ORAL | Status: DC | PRN
  Administered 2013-01-09 – 2013-01-10 (×4): 30 mL via ORAL

## 2013-01-09 MED ORDER — PNEUMOCOCCAL VAC POLYVALENT 25 MCG/0.5ML IJ INJ: 0.5000 mL | INJECTION | INTRAMUSCULAR | Status: AC

## 2013-01-09 MED ORDER — LAMOTRIGINE 25 MG PO TABS
50.0000 mg | ORAL_TABLET | Freq: Two times a day (BID) | ORAL | Status: DC
  Administered 2013-01-10 – 2013-01-12 (×5): 50 mg via ORAL
  Filled 2013-01-09: qty 2
  Filled 2013-01-09: qty 16
  Filled 2013-01-09: qty 2
  Filled 2013-01-09: qty 16
  Filled 2013-01-09 (×2): qty 2
  Filled 2013-01-09 (×2): qty 16
  Filled 2013-01-09 (×3): qty 2

## 2013-01-09 MED ORDER — LISINOPRIL 20 MG PO TABS
20.0000 mg | ORAL_TABLET | Freq: Every day | ORAL | Status: DC
  Administered 2013-01-10 – 2013-01-12 (×3): 20 mg via ORAL
  Filled 2013-01-09 (×5): qty 1
  Filled 2013-01-09 (×2): qty 4

## 2013-01-09 MED ORDER — MEDROXYPROGESTERONE ACETATE 2.5 MG PO TABS
2.5000 mg | ORAL_TABLET | Freq: Every day | ORAL | Status: DC
  Administered 2013-01-10 – 2013-01-12 (×3): 2.5 mg via ORAL
  Filled 2013-01-09: qty 4
  Filled 2013-01-09 (×3): qty 1
  Filled 2013-01-09: qty 4
  Filled 2013-01-09: qty 1

## 2013-01-09 MED ORDER — METOPROLOL TARTRATE 25 MG PO TABS
25.0000 mg | ORAL_TABLET | Freq: Two times a day (BID) | ORAL | Status: DC
  Administered 2013-01-10 – 2013-01-12 (×5): 25 mg via ORAL
  Filled 2013-01-09 (×5): qty 1
  Filled 2013-01-09: qty 8
  Filled 2013-01-09: qty 1
  Filled 2013-01-09 (×2): qty 8
  Filled 2013-01-09: qty 1
  Filled 2013-01-09: qty 8
  Filled 2013-01-09: qty 1

## 2013-01-09 MED ORDER — INFLUENZA VIRUS VACC SPLIT PF IM SUSP: 0.5000 mL | INTRAMUSCULAR | Status: AC

## 2013-01-09 MED ORDER — VENLAFAXINE HCL ER 150 MG PO CP24
150.0000 mg | ORAL_CAPSULE | Freq: Every day | ORAL | Status: DC
  Administered 2013-01-10 – 2013-01-12 (×3): 150 mg via ORAL
  Filled 2013-01-09: qty 4
  Filled 2013-01-09 (×2): qty 1
  Filled 2013-01-09: qty 4
  Filled 2013-01-09 (×2): qty 1

## 2013-01-09 MED ORDER — ACETAMINOPHEN 325 MG PO TABS
650.0000 mg | ORAL_TABLET | Freq: Four times a day (QID) | ORAL | Status: DC | PRN
  Administered 2013-01-10: 650 mg via ORAL

## 2013-01-09 MED ORDER — CLONAZEPAM 0.5 MG PO TABS
0.5000 mg | ORAL_TABLET | Freq: Two times a day (BID) | ORAL | Status: DC | PRN
  Administered 2013-01-10 – 2013-01-12 (×3): 0.5 mg via ORAL
  Filled 2013-01-09 (×6): qty 1

## 2013-01-09 MED ORDER — FUROSEMIDE 20 MG PO TABS
20.0000 mg | ORAL_TABLET | Freq: Every day | ORAL | Status: DC
  Administered 2013-01-10 – 2013-01-12 (×3): 20 mg via ORAL
  Filled 2013-01-09 (×3): qty 1
  Filled 2013-01-09 (×2): qty 4
  Filled 2013-01-09: qty 1

## 2013-01-09 MED ORDER — METFORMIN HCL 500 MG PO TABS
500.0000 mg | ORAL_TABLET | Freq: Two times a day (BID) | ORAL | Status: DC
  Administered 2013-01-10 – 2013-01-12 (×5): 500 mg via ORAL
  Filled 2013-01-09 (×3): qty 1
  Filled 2013-01-09: qty 8
  Filled 2013-01-09: qty 1
  Filled 2013-01-09 (×2): qty 8
  Filled 2013-01-09 (×2): qty 1
  Filled 2013-01-09: qty 8
  Filled 2013-01-09: qty 1

## 2013-01-09 MED ORDER — ARIPIPRAZOLE 2 MG PO TABS
2.0000 mg | ORAL_TABLET | Freq: Every day | ORAL | Status: DC
  Administered 2013-01-10 – 2013-01-12 (×3): 2 mg via ORAL
  Filled 2013-01-09 (×3): qty 1
  Filled 2013-01-09 (×2): qty 4
  Filled 2013-01-09: qty 1

## 2013-01-09 MED ORDER — ESTRADIOL 1 MG PO TABS
2.0000 mg | ORAL_TABLET | Freq: Every day | ORAL | Status: DC
  Administered 2013-01-10 – 2013-01-12 (×3): 2 mg via ORAL
  Filled 2013-01-09: qty 1
  Filled 2013-01-09: qty 8
  Filled 2013-01-09 (×3): qty 1
  Filled 2013-01-09: qty 8

## 2013-01-09 NOTE — ED Provider Notes (Signed)
Act team indicates pt accepted to bhd, Dr Daleen Bo, bed ready.   Suzi Roots, MD 01/09/13 [phone]

## 2013-01-09 NOTE — ED Provider Notes (Signed)
DR Trisha Mangle (PSY) recs [facility] admit  Mackenzie Nielsen, MD 01/09/13 [phone]

## 2013-01-09 NOTE — ED Notes (Signed)
Report called to Lamount Cranker RN. States she is familiar with patient as she has had multiple addmissions Will be transported by security after dinner.

## 2013-01-09 NOTE — BHH Counselor (Signed)
Patient accepted to [facility] by Alvy Beal, PA to Dr. [name] North. Patients room assignment is 501-2. Patient pending transfer to [facility] after 6pm today. EDP-Dr. Cathren Laine  made aware of patients disposition. Patients nurse made aware and will need to call report prior to discharge @ [phone]. Support paperwork completed and faxed to [facility].
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
## 2013-01-09 NOTE — ED Notes (Signed)
Dr Trisha Mangle from [facility] called into get information on pt to do telepsych.

## 2013-01-09 NOTE — ED Notes (Signed)
Up tot he bathroom to shower and change scrubs 
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
## 2013-01-09 NOTE — Tx Team (Signed)
Initial Interdisciplinary Treatment Plan  PATIENT STRENGTHS: (choose at least two) Ability for insight Average or above average intelligence Capable of independent living Communication skills Financial means General fund of knowledge Motivation for treatment/growth Physical Health Religious Affiliation Special hobby/interest  PATIENT STRESSORS: Loss of sister 2 yrs ago and divorce final Dec 2013* Marital or family conflict   PROBLEM LIST: Problem List/Patient Goals Date to be addressed Date deferred Reason deferred Estimated date of resolution    "To keep myself safe and not want to hurt myself anymore". 01/09/13           "I'm feeling really sad and depressed" 01/09/13           Depression 01/09/13     Increased risk for suicide 01/09/13                        DISCHARGE CRITERIA:  Ability to meet basic life and health needs Adequate post-discharge living arrangements Improved stabilization in mood, thinking, and/or behavior Medical problems require only outpatient monitoring Motivation to continue treatment in a less acute level of care Need for constant or close observation no longer present Reduction of life-threatening or endangering symptoms to within safe limits Safe-care adequate arrangements made Verbal commitment to aftercare and medication compliance  PRELIMINARY DISCHARGE PLAN: Outpatient therapy Participate in family therapy Return to previous living arrangement  PATIENT/FAMIILY INVOLVEMENT: This treatment plan has been presented to and reviewed with the patient, [name], and/or family member.  The patient and family have been given the opportunity to ask questions and make suggestions.  Fransico Michael [facility] 01/09/2013, 7:36 PM

## 2013-01-09 NOTE — ED Notes (Signed)
Up to the bathroom 

## 2013-01-10 DIAGNOSIS — F4323 Adjustment disorder with mixed anxiety and depressed mood: Secondary | ICD-10-CM

## 2013-01-10 LAB — GLUCOSE, CAPILLARY
Glucose-Capillary: 146 mg/dL — ABNORMAL HIGH (ref 70–99)
Glucose-Capillary: 114 mg/dL — ABNORMAL HIGH (ref 70–99)

## 2013-01-10 MED ORDER — METFORMIN HCL 500 MG PO TABS: 500.0000 mg | ORAL_TABLET | Freq: Two times a day (BID) | ORAL | Status: DC

## 2013-01-10 MED ORDER — CLONAZEPAM 0.5 MG PO TABS
0.5000 mg | ORAL_TABLET | Freq: Two times a day (BID) | ORAL | Status: DC | PRN
  Administered 2013-01-10 – 2013-01-11 (×2): 0.5 mg via ORAL

## 2013-01-10 MED ORDER — BENZONATATE 100 MG PO CAPS: 100.0000 mg | ORAL_CAPSULE | Freq: Three times a day (TID) | ORAL | Status: DC | PRN

## 2013-01-10 MED ORDER — LAMOTRIGINE 25 MG PO TABS: 50.0000 mg | ORAL_TABLET | Freq: Two times a day (BID) | ORAL | Status: DC

## 2013-01-10 MED ORDER — LISINOPRIL 20 MG PO TABS: 20.0000 mg | ORAL_TABLET | Freq: Every day | ORAL | Status: DC

## 2013-01-10 MED ORDER — ASPIRIN-ACETAMINOPHEN-CAFFEINE 250-250-65 MG PO TABS
2.0000 | ORAL_TABLET | Freq: Three times a day (TID) | ORAL | Status: DC | PRN
  Administered 2013-01-10 – 2013-01-11 (×4): 2 via ORAL
  Filled 2013-01-10: qty 2

## 2013-01-10 MED ORDER — VENLAFAXINE HCL ER 150 MG PO CP24: 150.0000 mg | ORAL_CAPSULE | Freq: Every day | ORAL | Status: DC

## 2013-01-10 MED ORDER — ONDANSETRON 4 MG PO TBDP
ORAL_TABLET | ORAL | Status: AC
  Administered 2013-01-10: 4 mg
  Filled 2013-01-10: qty 1

## 2013-01-10 MED ORDER — FUROSEMIDE 20 MG PO TABS: 20.0000 mg | ORAL_TABLET | Freq: Every day | ORAL | Status: DC

## 2013-01-10 MED ORDER — ONDANSETRON HCL 4 MG PO TABS
4.0000 mg | ORAL_TABLET | Freq: Once | ORAL | Status: AC
  Administered 2013-01-10: 4 mg via ORAL
  Filled 2013-01-10: qty 1

## 2013-01-10 MED ORDER — ARIPIPRAZOLE 2 MG PO TABS: 2.0000 mg | ORAL_TABLET | Freq: Every day | ORAL | Status: DC

## 2013-01-10 MED ORDER — DIPHENHYDRAMINE HCL 25 MG PO CAPS
50.0000 mg | ORAL_CAPSULE | Freq: Every evening | ORAL | Status: DC | PRN
Start: 2013-01-10 — End: 2013-01-12
  Administered 2013-01-10 – 2013-01-12 (×2): 50 mg via ORAL

## 2013-01-10 NOTE — Progress Notes (Signed)
Patient ID: [name], female   DOB: [birth year], 47 y.o.   MRN: [id number] D: "I don't feel good." A: Pt. Denies lethality and A/V/H's, but endorses feelings of severe anxiety on and off throughout the day and "stomach upset" which she said was "nerves" and "I can't eat", despite repeated doses of Mylanta (equiv.).  Per N. Mashburn, Pt. was given Zofran 4mg  as a one time dose for her stomach upset/nausea.  Pt. Then was able to to go to supper and came back with a bright affect stating she was "able to eat!".  Pt. stayed in bed for most of the shift, but did attend the PM group, though she did not participate actively, she was attentive.  Pt. Also had headache today for which she received Excedrine (equiv.) and was better.  R: Will continue to monitor for changes.

## 2013-01-10 NOTE — Progress Notes (Signed)
Patient ID: [name], female   DOB: [birth year], 47 y.o.   MRN: [id number] D)  Came to med window after being admitted, requested exedrin for headache, but hadn't been ordered, although list of home meds had been read to MD.   Affect is rather flat, sad and anxious.  Is able to contract for safety, denies SI.  Pleasant, cooperative, appropriate but anxious.   A)  Will continue to monitor, continue POC,  R)  Remains safe on unit.
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
## 2013-01-10 NOTE — Progress Notes (Signed)
Patient ID: [name], female   DOB: Apr 11, 1966, 47 y.o.   MRN: [id number] D)  Was lying in bed this evening, didn't want to go to group, stated her head hurt and she didn't feel she could sit through group.  Was offered exedrin migraine, which she did come out to the med window for, and went back to bed.  Stated she had had a miserable day, was upset that she couldn't get another dose of klonopin.  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  Had refused her flu and pneumo shot earlier today, and asked if I was aware she had refused them.  Went back to her room and to bed for now. A)  Will continue to monitor q 15 minutes for safety, cont. POC. R)  Safety maintained,

## 2013-01-10 NOTE — Clinical Social Work Psychosocial (Signed)
BHH Group Notes:  (Clinical Social Work)  01/10/2013     Summary of Progress/Problems:   Summary of Progress/Problems:   The main focus of today's process group was for the patient to define "support" and describe what healthy supports are, then to identify the patient's current support system and decide on other supports that can be put in place to prevent future hospitalizations.  Roleplay was used to demonstrate definitions of different types of available supports.  An emphasis was placed on using therapist, doctor, therapy groups, self-help groups and problem-specific support groups to expand supports.The patient expressed that she has poor to fair supports, but did not stay for all of group to explore how to increase.  Type of Therapy:  Process Group  Participation Level:  Minimal  Participation Quality:  Appropriate  Affect:  Depressed and Flat  Cognitive:  Oriented  Insight:  Limited  Engagement in Therapy:  Engaged  Modes of Intervention:  Clarification, Education, Limit-setting, Problem-solving, Socialization, Support and Processing, Exploration, Discussion, Role-Play   Ambrose Mantle, LCSW 01/10/2013, 12:02 PM
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
## 2013-01-10 NOTE — Progress Notes (Signed)
Psychoeducational Group Note  Date:  01/10/2013 Time:  2000  Group Topic/Focus:  Wrap-Up Group:   The focus of this group is to help patients review their daily goal of treatment and discuss progress on daily workbooks.  Participation Level:  Did Not Attend  Participation Quality:  N/A Patient did not attend  Affect:    Cognitive:    Insight:    Engagement in Group:    Additional Comments: Patient remained in bed awake for the duration of wrap-up group this evening.  Celso Granja, Newton Pigg 01/10/2013, 10:15 PM
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
## 2013-01-10 NOTE — Progress Notes (Signed)
[facility] Group Notes:  (Counselor/Nursing/MHT/Case Management/Adjunct)  01/10/2013 8:29 PM  Type of Therapy:  Group Therapy  Participation Level:  Minimal  Participation Quality:  Appropriate and Attentive  Affect:  Anxious, Appropriate and Depressed  Cognitive:  Alert  Insight:  Limited  Engagement in Group:  Limited  Engagement in Therapy:  Limited  Modes of Intervention:  Clarification, Discussion and Education  Summary of Progress/Problems:   [name] 01/10/2013, 8:29 PM

## 2013-01-10 NOTE — H&P (Signed)
I agree with major elements of this evaluation.

## 2013-01-11 DIAGNOSIS — F329 Major depressive disorder, single episode, unspecified: Secondary | ICD-10-CM

## 2013-01-11 LAB — GLUCOSE, CAPILLARY
Glucose-Capillary: 123 mg/dL — ABNORMAL HIGH (ref 70–99)
Glucose-Capillary: 172 mg/dL — ABNORMAL HIGH (ref 70–99)
Glucose-Capillary: 114 mg/dL — ABNORMAL HIGH (ref 70–99)

## 2013-01-11 NOTE — Progress Notes (Signed)
Adult Psychoeducational Group Note  Date:  01/11/2013 Time:  11:08 PM  Group Topic/Focus:  Wrap-Up Group:   The focus of this group is to help patients review their daily goal of treatment and discuss progress on daily workbooks.  Participation Level:  Active  Participation Quality:  Supportive  Affect:  Appropriate  Cognitive:  Appropriate  Insight: Appropriate  Engagement in Group:  Supportive  Modes of Intervention:  Support  Additional Comments:    Gwenevere Ghazi Patience 01/11/2013, 11:08 PM

## 2013-01-11 NOTE — Progress Notes (Signed)
Patient ID: [name], female   DOB: [birth year], 47 y.o.   MRN: [id number] PER STATE REGULATIONS 482.30  THIS CHART WAS REVIEWED FOR MEDICAL NECESSITY WITH RESPECT TO THE PATIENT'S ADMISSION/ DURATION OF STAY.  NEXT REVIEW DATE: 01/12/2013  Willa Rough, RN, BSN CASE MANAGER

## 2013-01-11 NOTE — Tx Team (Signed)
Interdisciplinary Treatment Plan Update (Adult)  Date:  01/11/2013  Time Reviewed:  1:08 PM   Progress in Treatment: Attending groups:   Yes   Participating in groups:  Yes Taking medication as prescribed:  Yes Tolerating medication:  Yes Family/Significant othe contact made: Contact to be made with family Patient understands diagnosis:  Yes Discussing patient identified problems/goals with staff: Yes Medical problems stabilized or resolved: Yes Denies suicidal/homicidal ideation:Yes Issues/concerns per patient self-inventory:  Other:   New problem(s) identified:  Reason for Continuation of Hospitalization: Anxiety Depression Medication stabilization  Interventions implemented related to continuation of hospitalization:  Medication Management; safety checks q 15 mins  Additional comments:  Estimated length of stay:  2-3 days  Discharge Plan:  Home with outpatient follow up  New goal(s):  Review of initial/current patient goals per problem list:    1.  Goal(s): Eliminate SI/other thoughts of self harm   Met:  Yes  Target date: d/c  As evidenced by: Patient will no longer endorse SI/HI or other thoughts of self harm.    2.  Goal (s):Reduce depression/anxiety (rated at five today)  Met: No  Target date: d/c  As evidenced by: Patient will rate symptoms at four or below    3.  Goal(s):.stabilize on meds   Met:  No  Target date: d/c  As evidenced by: Patient will report being stabilized on medications - less symptomatic    4.  Goal(s): Refer for outpatient follow up   Met:  No  Target date: d/c  As evidenced by: Follow up appointment will  Be scheduled    Attendees: Patient:   01/11/2013 1:08 PM  Physican:  [name] North, MD 01/11/2013 1:08 PM  Nursing:  Neill Loft, RN 01/11/2013 1:08 PM   Nursing:   Berneice Heinrich, RN 01/11/2013 1:08 PM   Clinical Social Worker:  Juline Patch, LCSW 01/11/2013 1:08 PM   Other:  01/11/2013 1:08 PM   Other:              01/11/2013 1:08 PM Other:        01/11/2013 1:08 PM

## 2013-01-11 NOTE — Tx Team (Signed)
Interdisciplinary Treatment Plan Update (Adult)  Date:  01/11/2013  Time Reviewed:  12:58 PM   Progress in Treatment: Attending groups:   Yes   Participating in groups:  Yes Taking medication as prescribed:  Yes Tolerating medication:  Yes Family/Significant othe contact made: Contact made with family Patient understands diagnosis:  Yes Discussing patient identified problems/goals with staff: Yes Medical problems stabilized or resolved: Yes Denies suicidal/homicidal ideation:Yes Issues/concerns per patient self-inventory:  Other:   New problem(s) identified:  Reason for Continuation of Hospitalization: Anxiety Depression Medication stabilization  Interventions implemented related to continuation of hospitalization:  Medication Management; safety checks q 15 mins  Additional comments:  Estimated length of stay:  1-2 days  Discharge Plan:  Home with outpatient follow up  New goal(s):  Review of initial/current patient goals per problem list:    1.  Goal(s): Eliminate SI/other thoughts of self harm   Met:  Yes  Target date: d/c  As evidenced by: Patient no longer endorsing SI/HI or other thoughts of self harm.    2.  Goal (s):Reduce depression/anxiety (rated at five today)  Met: No  Target date: d/c  As evidenced by: Patient will rate symptoms at four or below    3.  Goal(s):.stabilize on meds   Met:  No  Target date: d/c  As evidenced by: Patient will report being stabilized on medications - less symptomatic    4.  Goal(s): Refer for outpatient follow up   Met:  No  Target date: d/c  As evidenced by: Follow up appointment scheduled    Attendees: Patient:   01/11/2013 12:58 PM  Physican:  [name] North, MD 01/11/2013 12:58 PM  Nursing:  Neill Loft, RN 01/11/2013 12:58 PM   Nursing:   Berneice Heinrich, RN 01/11/2013 12:58 PM   Clinical Social Worker:  Juline Patch, LCSW 01/11/2013 12:58 PM   Other: Tera Helper, PHM-NP 01/11/2013 12:58 PM     Other:   01/11/2013 12:58 PM Other:        01/11/2013 12:58 PM
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
## 2013-01-11 NOTE — Progress Notes (Signed)
D.  Pt. Attended group this evening .  Pt. Had been anxious earlier due to an unexpected visitor.  Pt. Reports that she talked with her boy friend and they talked and agreed on some of the issues they are having and decided to take things slow. A.  Support given. R.  Pt. Receptive.

## 2013-01-11 NOTE — Progress Notes (Signed)
[facility] MD Progress Note  01/11/2013 1:31 PM [name]  MRN:  [id number] Subjective:  Patient seen , she is lying in bed, has a headache. She reports that she sorted out her issues with her boyfriend and is feeling much better. Would like to be discharged tomorrow.  Diagnosis:   Axis I: Depressive Disorder NOS Axis II: Deferred Axis III:  Past Medical History  Diagnosis Date  . Hypertension   . Diabetes mellitus   . Arthritis   . Depression   . Anxiety   . Headache   . History of borderline personality disorder    Axis IV: other psychosocial or environmental problems Axis V: 51-60 moderate symptoms  ADL's:  Intact  Sleep: Fair  Appetite:  Fair   Psychiatric Specialty Exam: Review of Systems  Constitutional: Negative.   Eyes: Negative.   Respiratory: Negative.   Cardiovascular: Negative.   Gastrointestinal: Negative.   Genitourinary: Negative.   Musculoskeletal: Negative.   Skin: Negative.   Neurological: Positive for headaches.  Endo/Heme/Allergies: Negative.   Psychiatric/Behavioral: Positive for depression. [name].     Blood pressure 125/84, pulse 91, temperature 98.5 F (36.9 C), temperature source Oral, resp. rate 16, height 5\' 4"  (1.626 m), weight 94.802 kg (209 lb).Body mass index is 35.87 kg/(m^2).  General Appearance: Casual  Eye Contact::  Fair  Speech:  Normal Rate  Volume:  Normal  Mood:  Anxious and Depressed  Affect:  Constricted  Thought Process:  Coherent  Orientation:  Full (Time, Place, and Person)  Thought Content:  WDL  Suicidal Thoughts:  No  Homicidal Thoughts:  No  Memory:  Immediate;   Fair Recent;   Fair Remote;   Fair  Judgement:  Fair  Insight:  Fair  Psychomotor Activity:  Normal  Concentration:  Fair  Recall:  Fair  Akathisia:  No  Handed:  Right  AIMS (if indicated):     Assets:  Communication Skills Desire for Improvement Housing Social Support  Sleep:  Number of Hours: 4.5    Current  Medications: Current Facility-Administered Medications  Medication Dose Route Frequency Provider Last Rate Last Dose  . acetaminophen (TYLENOL) tablet 650 mg  650 mg Oral Q6H PRN Gerrard Crystal, MD   650 mg at 01/10/13 0330  . alum & mag hydroxide-simeth (MAALOX/MYLANTA) 200-200-20 MG/5ML suspension 30 mL  30 mL Oral Q4H PRN Mccayla Shimada, MD   30 mL at 01/10/13 2239  . ARIPiprazole (ABILIFY) tablet 2 mg  2 mg Oral Daily Cleotis Nipper, MD   2 mg at 01/11/13 0756  . aspirin-acetaminophen-caffeine (EXCEDRIN MIGRAINE) per tablet 2 tablet  2 tablet Oral Q8H PRN Mickie D. Adams, PA   2 tablet at 01/11/13 [phone]  . benzonatate (TESSALON) capsule 100 mg  100 mg Oral TID PRN Mickie D. Adams, PA      . clonazePAM Scarlette Calico) tablet 0.5 mg  0.5 mg Oral BID PRN Cleotis Nipper, MD   0.5 mg at 01/10/13 0329  . clonazePAM (KLONOPIN) tablet 0.5 mg  0.5 mg Oral BID PRN Mickie D. Adams, PA   0.5 mg at 01/11/13 0755  . diphenhydrAMINE (BENADRYL) capsule 50 mg  50 mg Oral QHS PRN Verne Spurr, PA-C   50 mg at 01/10/13 2237  . estradiol (ESTRACE) tablet 2 mg  2 mg Oral Daily Cleotis Nipper, MD   2 mg at 01/11/13 0756  . furosemide (LASIX) tablet 20 mg  20 mg Oral Daily Cleotis Nipper, MD   20  mg at 01/11/13 0755  . lamoTRIgine (LAMICTAL) tablet 50 mg  50 mg Oral BID Cleotis Nipper, MD   50 mg at 01/11/13 0756  . lisinopril (PRINIVIL,ZESTRIL) tablet 20 mg  20 mg Oral Daily Cleotis Nipper, MD   20 mg at 01/11/13 0755  . magnesium hydroxide (MILK OF MAGNESIA) suspension 30 mL  30 mL Oral Daily PRN Alba Perillo, MD      . medroxyPROGESTERone (PROVERA) tablet 2.5 mg  2.5 mg Oral Daily Cleotis Nipper, MD   2.5 mg at 01/11/13 0756  . metFORMIN (GLUCOPHAGE) tablet 500 mg  500 mg Oral BID WC Cleotis Nipper, MD   500 mg at 01/11/13 0756  . metoprolol tartrate (LOPRESSOR) tablet 25 mg  25 mg Oral BID Cleotis Nipper, MD   25 mg at 01/11/13 0756  . venlafaxine XR (EFFEXOR-XR) 24 hr capsule 150 mg  150 mg Oral Q breakfast Cleotis Nipper,  MD   150 mg at 01/11/13 6578    Lab Results:  Results for orders placed during the hospital encounter of 01/09/13 (from the past 48 hour(s))  GLUCOSE, CAPILLARY     Status: Abnormal   Collection Time   01/10/13  6:21 AM      Component Value Range Comment   Glucose-Capillary 146 (*) 70 - 99 mg/dL   GLUCOSE, CAPILLARY     Status: Abnormal   Collection Time   01/10/13 11:34 AM      Component Value Range Comment   Glucose-Capillary 120 (*) 70 - 99 mg/dL    Comment 1 Notify RN      Comment 2 Documented in Chart     GLUCOSE, CAPILLARY     Status: Abnormal   Collection Time   01/10/13  5:10 PM      Component Value Range Comment   Glucose-Capillary 114 (*) 70 - 99 mg/dL   GLUCOSE, CAPILLARY     Status: Abnormal   Collection Time   01/11/13  5:58 AM      Component Value Range Comment   Glucose-Capillary 167 (*) 70 - 99 mg/dL    Comment 1 Notify RN      Comment 2 Documented in Chart     GLUCOSE, CAPILLARY     Status: Abnormal   Collection Time   01/11/13 11:45 AM      Component Value Range Comment   Glucose-Capillary 123 (*) 70 - 99 mg/dL     Physical Findings: AIMS: Facial and Oral Movements Muscles of Facial Expression: None, normal Lips and Perioral Area: None, normal Jaw: None, normal Tongue: None, normal,Extremity Movements Upper (arms, wrists, hands, fingers): None, normal Lower (legs, knees, ankles, toes): None, normal, Trunk Movements Neck, shoulders, hips: None, normal, Overall Severity Severity of abnormal movements (highest score from questions above): None, normal Incapacitation due to abnormal movements: None, normal Patient's awareness of abnormal movements (rate only patient's report): No Awareness, Dental Status Current problems with teeth and/or dentures?: No Does patient usually wear dentures?: No  CIWA:    COWS:     Treatment Plan Summary: Daily contact with patient to assess and evaluate symptoms and progress in treatment Medication  management  Plan: Continue current plan of care. Plan for discharge tomorrow.  Medical Decision Making Problem Points:  Established problem, stable/improving (1), Review of last therapy session (1) and Review of psycho-social stressors (1) Data Points:  Review of medication regiment & side effects (2)  I certify that inpatient services furnished can reasonably be expected  to improve the patient's condition.   Jase Reep 01/11/2013, 1:31 PM

## 2013-01-11 NOTE — Progress Notes (Signed)
D: Pt states she slept fair, appetite improving, energy level is normal and focus is improving. Pt rates her depression at a 4, hopelessness at a 2 with 10 being the worst feeling. A: Pt going to groups, interacting well with staff and peers. 15 minute checks R: Pt denies SI/HI. Mood seems stable.

## 2013-01-11 NOTE — Clinical Social Work Note (Signed)
[facility] LCSW Group Therapy          Overcoming Obstacles       1:15 -2:30        01/11/2013 3:20 PM   Type of Therapy:  Group Therapy  Participation Level:  Appropriate  Participation Quality:  Did not Attend Group   Jennilee Demarco, [place] July 01/11/2013 3:20 PM

## 2013-01-12 DIAGNOSIS — F411 Generalized anxiety disorder: Secondary | ICD-10-CM

## 2013-01-12 DIAGNOSIS — F339 Major depressive disorder, recurrent, unspecified: Principal | ICD-10-CM

## 2013-01-12 DIAGNOSIS — F410 Panic disorder [episodic paroxysmal anxiety] without agoraphobia: Secondary | ICD-10-CM

## 2013-01-12 DIAGNOSIS — F429 Obsessive-compulsive disorder, unspecified: Secondary | ICD-10-CM

## 2013-01-12 DIAGNOSIS — F4310 Post-traumatic stress disorder, unspecified: Secondary | ICD-10-CM

## 2013-01-12 MED ORDER — CLONAZEPAM 1 MG PO TABS: 0.5000 mg | ORAL_TABLET | Freq: Two times a day (BID) | ORAL | Status: DC | PRN

## 2013-01-12 MED ORDER — FUROSEMIDE 20 MG PO TABS: 20.0000 mg | ORAL_TABLET | Freq: Every day | ORAL | Status: DC

## 2013-01-12 MED ORDER — ASPIRIN-ACETAMINOPHEN-CAFFEINE 250-250-65 MG PO TABS: 2.0000 | ORAL_TABLET | Freq: Four times a day (QID) | ORAL | Status: DC | PRN

## 2013-01-12 MED ORDER — BENZONATATE 100 MG PO CAPS: 100.0000 mg | ORAL_CAPSULE | Freq: Three times a day (TID) | ORAL | Status: DC | PRN

## 2013-01-12 MED ORDER — LAMOTRIGINE 25 MG PO TABS: 50.0000 mg | ORAL_TABLET | Freq: Two times a day (BID) | ORAL | Status: DC

## 2013-01-12 MED ORDER — VENLAFAXINE HCL ER 150 MG PO CP24: 150.0000 mg | ORAL_CAPSULE | Freq: Every day | ORAL | Status: DC

## 2013-01-12 MED ORDER — METFORMIN HCL 500 MG PO TABS: 500.0000 mg | ORAL_TABLET | Freq: Two times a day (BID) | ORAL | Status: DC

## 2013-01-12 MED ORDER — ARIPIPRAZOLE 2 MG PO TABS: 2.0000 mg | ORAL_TABLET | Freq: Every day | ORAL | Status: DC

## 2013-01-12 MED ORDER — ESTRADIOL 2 MG PO TABS: 2.0000 mg | ORAL_TABLET | Freq: Every day | ORAL | Status: DC

## 2013-01-12 MED ORDER — MEDROXYPROGESTERONE ACETATE 2.5 MG PO TABS: 2.5000 mg | ORAL_TABLET | Freq: Every day | ORAL | Status: DC

## 2013-01-12 MED ORDER — METOPROLOL TARTRATE 25 MG PO TABS: 25.0000 mg | ORAL_TABLET | Freq: Two times a day (BID) | ORAL | Status: DC

## 2013-01-12 MED ORDER — LISINOPRIL 20 MG PO TABS: 20.0000 mg | ORAL_TABLET | Freq: Every day | ORAL | Status: DC
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
## 2013-01-12 NOTE — Progress Notes (Signed)
Patient ID: [name], female   DOB: February 12, 1966, 47 y.o.   MRN: [id number] Patient is discharged ambulatory to drive herself home after security drives her to her car at [facility].  She denies SI/HI.  She verbalizes understanding of her followup appointments and hre discharge medications.  She was given scripts and a small supply of meds by MD.  Patient verbalized appreciation for care received.
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
## 2013-01-12 NOTE — Progress Notes (Signed)
Patient ID: [name], female   DOB: 1966/09/24, 47 y.o.   MRN: [id number] PER STATE REGULATIONS 482.30  THIS CHART WAS REVIEWED FOR MEDICAL NECESSITY WITH RESPECT TO THE PATIENT'S ADMISSION/ DURATION OF STAY.  NEXT REVIEW DATE: 01/15/2013  Willa Rough, RN, BSN CASE MANAGER

## 2013-01-12 NOTE — Progress Notes (Signed)
[facility] INPATIENT:  Family/Significant Other Suicide Prevention Education  Suicide Prevention Education:  Education Completed; Ahana Najera, mother, [phone] has been identified by the patient as the family member/significant other with whom the patient will be residing, and identified as the person(s) who will aid the patient in the event of a mental health crisis (suicidal ideations/suicide attempt).  With written consent from the patient, the family member/significant other has been provided the following suicide prevention education, prior to the and/or following the discharge of the patient.  The suicide prevention education provided includes the following:  Suicide risk factors  Suicide prevention and interventions  National Suicide Hotline telephone number  [facility] assessment telephone number  [facility] Emergency Assistance 911  [facility] and/or Residential Mobile Crisis Unit telephone number  Request made of family/significant other to:  Remove weapons (e.g., guns, rifles, knives), all items previously/currently identified as safety concern.    Remove drugs/medications (over-the-counter, prescriptions, illicit drugs), all items previously/currently identified as a safety concern.  The family member/significant other verbalizes understanding of the suicide prevention education information provided.  The family member/significant other agrees to remove the items of safety concern listed above.  Daryel Gerald B 01/12/2013, 1:04 PM

## 2013-01-12 NOTE — Progress Notes (Signed)
[facility] Adult Case Management Discharge Plan :  Will you be returning to the same living situation after discharge: Yes,  home At discharge, do you have transportation home?:Yes,  family Do you have the ability to pay for your medications:Yes,  insurance  Release of information consent forms completed and in the chart;  Patient's signature needed at discharge.  Patient to Follow up at: Follow-up Information    Follow up with [facility]. On 01/14/2013. (Dr Dan Humphreys 8:30)    Contact information:   [address]  [address]  [place], [place]  [phone]      Follow up with [facility]. (Call Jasmine December to make a therapy appointment.  She will see you for $20. per session)    Contact information:   [facility] [address]  [phone]         Patient denies SI/HI:   Yes,  yes    Safety Planning and Suicide Prevention discussed:  Yes,  yes  Ida Rogue 01/12/2013, 12:32 PM

## 2013-01-12 NOTE — Clinical Social Work Note (Signed)
[facility] LCSW Aftercare Discharge Planning Group Note       8:30-9:30 AM  1/14/201412:48 PM  Participation Quality:  Appropriate  Affect:  Appropriate  Cognitive:  Appropriate  Insight:  Engaged  Engagement in Group:  Engaged  Modes of Intervention:  Education, Exploration, Problem-solving and Support  Summary of Progress/Problems:  Patient advised for doing well and hoping to discharge home.  She denied SI/HI and rated depression at five and anxiety at nine.  Wynn Banker 01/12/2013 12:48 PM

## 2013-01-13 MED ORDER — ASPIRIN-ACETAMINOPHEN-CAFFEINE 250-250-65 MG PO TABS: 2.0000 | ORAL_TABLET | Freq: Four times a day (QID) | ORAL | Status: DC | PRN

## 2013-01-14 ENCOUNTER — Telehealth (HOSPITAL_COMMUNITY): Payer: Self-pay | Admitting: Psychiatry

## 2013-01-14 ENCOUNTER — Ambulatory Visit (INDEPENDENT_AMBULATORY_CARE_PROVIDER_SITE_OTHER): Payer: Medicare Other | Admitting: Psychiatry

## 2013-01-14 ENCOUNTER — Encounter (HOSPITAL_COMMUNITY): Payer: Self-pay | Admitting: Psychiatry

## 2013-01-14 VITALS — Wt 206.4 lb

## 2013-01-14 DIAGNOSIS — IMO0001 Reserved for inherently not codable concepts without codable children: Secondary | ICD-10-CM

## 2013-01-14 DIAGNOSIS — F339 Major depressive disorder, recurrent, unspecified: Secondary | ICD-10-CM

## 2013-01-14 DIAGNOSIS — F1994 Other psychoactive substance use, unspecified with psychoactive substance-induced mood disorder: Secondary | ICD-10-CM

## 2013-01-14 DIAGNOSIS — F603 Borderline personality disorder: Secondary | ICD-10-CM

## 2013-01-14 DIAGNOSIS — T7422XA Child sexual abuse, confirmed, initial encounter: Secondary | ICD-10-CM

## 2013-01-14 DIAGNOSIS — F429 Obsessive-compulsive disorder, unspecified: Secondary | ICD-10-CM

## 2013-01-14 DIAGNOSIS — F410 Panic disorder [episodic paroxysmal anxiety] without agoraphobia: Secondary | ICD-10-CM

## 2013-01-14 DIAGNOSIS — F332 Major depressive disorder, recurrent severe without psychotic features: Secondary | ICD-10-CM | POA: Diagnosis not present

## 2013-01-14 DIAGNOSIS — F0781 Postconcussional syndrome: Secondary | ICD-10-CM

## 2013-01-14 DIAGNOSIS — F1320 Sedative, hypnotic or anxiolytic dependence, uncomplicated: Secondary | ICD-10-CM

## 2013-01-14 DIAGNOSIS — T7432XA Child psychological abuse, confirmed, initial encounter: Secondary | ICD-10-CM

## 2013-01-14 MED ORDER — CLONAZEPAM 0.5 MG PO TABS: ORAL_TABLET | ORAL | Status: DC

## 2013-01-14 MED ORDER — LAMOTRIGINE 25 MG PO TABS: 50.0000 mg | ORAL_TABLET | Freq: Every day | ORAL | Status: DC

## 2013-01-14 MED ORDER — VENLAFAXINE HCL ER 150 MG PO CP24: 150.0000 mg | ORAL_CAPSULE | Freq: Every day | ORAL | Status: DC

## 2013-01-14 MED ORDER — ARIPIPRAZOLE 2 MG PO TABS: 2.0000 mg | ORAL_TABLET | Freq: Every day | ORAL | Status: DC
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
## 2013-01-14 NOTE — Telephone Encounter (Signed)
Phone message completed in the phone message section.  

## 2013-01-14 NOTE — Patient Instructions (Addendum)
Turn script for Klonopin from the Hospital in at the office.  Try slightly higher dose of Abilify to see if that helps the anxiety for you.  Have the conversation with your daughter and either boy friend or counselor that includes: Mother Daughter relationship is long term and must deal honestly with each other regardless of other relationships.  Boy friend relationship is new relationship and can not be used to vent anger or other frustration with the mother daughter relationship.  Yoga is a very helpful exercise method.  On TV or on line [employer] is a source of high quality information about yoga and videos on yoga.  Renee Ramus is the world's number one video yoga instructor according to some experts.  There are exceptional health benefits that can be achieved through yoga.  The main principles of yoga is acceptance, no competition, no comparison, and no judgement.  It is exceptional in helping people meditate and get to a very relaxed state.   CUT BACK/CUT OUT on sugar and carbohydrates, that means very limited fruits and starchy vegetables and very limited grains, breads  The goal is low GLYCEMIC INDEX.  CUT OUT all wheat, rye, or barley for the GLUTEN in them.  HIGH fat and LOW carbohydrate diet is the KEY.  Eat avocados, eggs, lean meat like grass fed beef and chicken  Nuts and seeds would be good foods as well.   Stevia is an excellent sweetener.  Safe for the brain.   Almond butter is awesome.  Check out all this on the Internet.  Call if problems or concerns.

## 2013-01-15 NOTE — Progress Notes (Addendum)
Patient Discharge Instructions:  After Visit Summary (AVS):   Faxed to:  01/15/13 Psychiatric Admission Assessment Note:   Faxed to:  01/15/13 Suicide Risk Assessment - Discharge Assessment:   Faxed to:  01/15/13 Faxed/Sent to the Next Level Care provider:  01/15/13 Next Level Care Provider Has Access to the EMR, 01/15/13 Faxed to [facility] Wilber Bihari @ [phone] Records provided to [facility] via CH[facility]/Epic access Jerelene Redden, 01/15/2013, 3:29 PM

## 2013-01-19 NOTE — Discharge Summary (Signed)
Reviewed

## 2013-01-20 ENCOUNTER — Ambulatory Visit (HOSPITAL_COMMUNITY): Payer: Self-pay | Admitting: Psychology

## 2013-03-01 ENCOUNTER — Ambulatory Visit (HOSPITAL_COMMUNITY): Payer: 59 | Admitting: Psychiatry

## 2013-03-01 ENCOUNTER — Encounter (HOSPITAL_COMMUNITY): Payer: Self-pay | Admitting: Psychiatry

## 2013-03-01 ENCOUNTER — Ambulatory Visit (INDEPENDENT_AMBULATORY_CARE_PROVIDER_SITE_OTHER): Payer: Medicare Other | Admitting: Psychiatry

## 2013-03-01 VITALS — Wt 211.4 lb

## 2013-03-01 DIAGNOSIS — F429 Obsessive-compulsive disorder, unspecified: Secondary | ICD-10-CM | POA: Diagnosis not present

## 2013-03-01 MED ORDER — ALPRAZOLAM 0.25 MG PO TABS: 0.2500 mg | ORAL_TABLET | Freq: Three times a day (TID) | ORAL | Status: DC | PRN

## 2013-03-01 MED ORDER — ARIPIPRAZOLE 2 MG PO TABS: 2.0000 mg | ORAL_TABLET | Freq: Every day | ORAL | Status: DC

## 2013-03-01 MED ORDER — LAMOTRIGINE 25 MG PO TABS: 50.0000 mg | ORAL_TABLET | Freq: Every day | ORAL | Status: DC

## 2013-03-01 NOTE — Progress Notes (Signed)
[facility] [id number] Progress Note [name] MRN: [id number] DOB: 1966-07-08 Age: 47 y.o.  Date: 03/01/2013 Start Time: 10:45 AM End Time: 11:30 AM  Chief Complaint: Chief Complaint  Patient presents with  . Depression  . Follow-up  . Medication Refill   Subjective:   "I've given up". Depression 7/10 and Anxiety 10/10, where 1 is the best and 10 is the worst.  Pain is 4/10 for her toes.  Pt comes for her follow-up appointment.  Pt reports that she is compliant with the psychotropic medications with poor benefit and some side effects.  She is having weight gain now back on the Abilify.  Discussed several options of her severe depression. Loss of energy, anxiety, panic, and fear.  She is having to move this week.  At the end of this month she is having to go to  [facility] with her daughter. She is asking to go back on the Xanax as it works the best.  She had done well on Zyprexa, but had feet swelling on that.  Discussed her going back into the hospital and getting ECT.  She has road blocks for every suggestion.  She thinks that she will think about her choices and will start Risperdal.  Diagnosis:   Axis I: Major Depression, Recurrent severe and Obsessive Compulsive Disorder Axis II: Borderline Personality Dis. Axis III:  Past Medical History  Diagnosis Date  . Hypertension   . Diabetes mellitus   . Arthritis   . Depression   . Anxiety   . Headache   . History of borderline personality disorder   . Personality disorder    Axis IV: other psychosocial or environmental problems Axis V: 51-60 moderate symptoms  ADL's:  Intact  Sleep: Good  Appetite:  Poor, improving from the higher dose of Lamictal "I've been eating just to keep my sugar up".   Psychiatric Specialty Exam: Review of Systems  Constitutional: Negative.   Eyes: Negative.   Respiratory: Negative.   Cardiovascular: Negative.   Gastrointestinal: Negative.   Genitourinary: Negative.    Musculoskeletal: Negative.   Skin: Negative.   Neurological: Positive for headaches.  Endo/Heme/Allergies: Negative.   Psychiatric/Behavioral: Positive for depression. [name].     Wt 211 lb 6.4 oz (95.89 kg)  BMI 36.27 kg/m2 Body mass index is 36.27 kg/(m^2).  General Appearance: Casual  Eye Contact::  Fair  Speech:  Normal Rate  Volume:  Normal  Mood:  Very distarught  Affect:  Constricted  Thought Process:  Coherent  Orientation:  Full (Time, Place, and Person)  Thought Content:  WDL  Suicidal Thoughts:  No  Homicidal Thoughts:  No  Memory:  Immediate;   Fair Recent;   Fair Remote;   Fair  Judgement:  Fair  Insight:  Fair  Psychomotor Activity:  Normal  Concentration:  Fair  Recall:  Fair  Akathisia:  No  Handed:  Right  AIMS (if indicated):     Assets:  Communication Skills Desire for Improvement Housing Social Support  Sleep:  16 in the last 24 hours.   Current Medications: Effexor XR 150 mg every AM Lamictal 25 mg 2 every AM Klonopin 0.5 mg BID-TID Abilify 2 mg every bed time  Lab Results:  Lab Results:  Results for orders placed during the hospital encounter of 01/09/13 (from the past 8736 hour(s))  GLUCOSE, CAPILLARY   Collection Time    01/10/13  6:21 AM      Result Value Range  Glucose-Capillary 146 (*) 70 - 99 mg/dL  GLUCOSE, CAPILLARY   Collection Time    01/10/13 11:34 AM      Result Value Range   Glucose-Capillary 120 (*) 70 - 99 mg/dL   Comment 1 Notify RN     Comment 2 Documented in Chart    GLUCOSE, CAPILLARY   Collection Time    01/10/13  5:10 PM      Result Value Range   Glucose-Capillary 114 (*) 70 - 99 mg/dL  GLUCOSE, CAPILLARY   Collection Time    01/11/13  5:58 AM      Result Value Range   Glucose-Capillary 167 (*) 70 - 99 mg/dL   Comment 1 Notify RN     Comment 2 Documented in Chart    GLUCOSE, CAPILLARY   Collection Time    01/11/13 11:45 AM      Result Value Range   Glucose-Capillary 123 (*)  70 - 99 mg/dL  GLUCOSE, CAPILLARY   Collection Time    01/11/13  5:09 PM      Result Value Range   Glucose-Capillary 172 (*) 70 - 99 mg/dL  GLUCOSE, CAPILLARY   Collection Time    01/11/13  9:05 PM      Result Value Range   Glucose-Capillary 114 (*) 70 - 99 mg/dL  GLUCOSE, CAPILLARY   Collection Time    01/12/13 11:58 AM      Result Value Range   Glucose-Capillary 86  70 - 99 mg/dL  Results for orders placed during the hospital encounter of 01/08/13 (from the past 8736 hour(s))  CBC WITH DIFFERENTIAL   Collection Time    01/08/13  2:31 PM      Result Value Range   WBC 7.6  4.0 - 10.5 K/uL   RBC 5.19 (*) 3.87 - 5.11 MIL/uL   Hemoglobin 13.4  12.0 - 15.0 g/dL   HCT 40.9  81.1 - 91.4 %   MCV 78.4  78.0 - 100.0 fL   MCH 25.8 (*) 26.0 - 34.0 pg   MCHC 32.9  30.0 - 36.0 g/dL   RDW 78.2  95.6 - 21.3 %   Platelets 225  150 - 400 K/uL   Neutrophils Relative 60  43 - 77 %   Neutro Abs 4.6  1.7 - 7.7 K/uL   Lymphocytes Relative 32  12 - 46 %   Lymphs Abs 2.4  0.7 - 4.0 K/uL   Monocytes Relative 6  3 - 12 %   Monocytes Absolute 0.4  0.1 - 1.0 K/uL   Eosinophils Relative 2  0 - 5 %   Eosinophils Absolute 0.1  0.0 - 0.7 K/uL   Basophils Relative 1  0 - 1 %   Basophils Absolute 0.1  0.0 - 0.1 K/uL  COMPREHENSIVE METABOLIC PANEL   Collection Time    01/08/13  2:31 PM      Result Value Range   Sodium 139  135 - 145 mEq/L   Potassium 4.1  3.5 - 5.1 mEq/L   Chloride 104  96 - 112 mEq/L   CO2 24  19 - 32 mEq/L   Glucose, Bld 120 (*) 70 - 99 mg/dL   BUN 12  6 - 23 mg/dL   Creatinine, Ser 0.86  0.50 - 1.10 mg/dL   Calcium 9.6  8.4 - 57.8 mg/dL   Total Protein 6.8  6.0 - 8.3 g/dL   Albumin 3.2 (*) 3.5 - 5.2 g/dL   AST 30  0 - 37  U/L   ALT 18  0 - 35 U/L   Alkaline Phosphatase 90  39 - 117 U/L   Total Bilirubin 0.2 (*) 0.3 - 1.2 mg/dL   GFR calc non Af Amer >90  >90 mL/min   GFR calc Af Amer >90  >90 mL/min  URINALYSIS, ROUTINE W REFLEX MICROSCOPIC   Collection Time    01/08/13   2:33 PM      Result Value Range   Color, Urine YELLOW  YELLOW   APPearance CLOUDY (*) CLEAR   Specific Gravity, Urine 1.029  1.005 - 1.030   pH 5.5  5.0 - 8.0   Glucose, UA NEGATIVE  NEGATIVE mg/dL   Hgb urine dipstick NEGATIVE  NEGATIVE   Bilirubin Urine NEGATIVE  NEGATIVE   Ketones, ur NEGATIVE  NEGATIVE mg/dL   Protein, ur NEGATIVE  NEGATIVE mg/dL   Urobilinogen, UA 1.0  0.0 - 1.0 mg/dL   Nitrite NEGATIVE  NEGATIVE   Leukocytes, UA NEGATIVE  NEGATIVE  URINE RAPID DRUG SCREEN (HOSP PERFORMED)   Collection Time    01/08/13  2:33 PM      Result Value Range   Opiates NONE DETECTED  NONE DETECTED   Cocaine NONE DETECTED  NONE DETECTED   Benzodiazepines POSITIVE (*) NONE DETECTED   Amphetamines NONE DETECTED  NONE DETECTED   Tetrahydrocannabinol NONE DETECTED  NONE DETECTED   Barbiturates NONE DETECTED  NONE DETECTED  GLUCOSE, CAPILLARY   Collection Time    01/08/13  4:44 PM      Result Value Range   Glucose-Capillary 135 (*) 70 - 99 mg/dL  GLUCOSE, CAPILLARY   Collection Time    01/08/13  9:52 PM      Result Value Range   Glucose-Capillary 98  70 - 99 mg/dL  GLUCOSE, CAPILLARY   Collection Time    01/09/13  8:10 AM      Result Value Range   Glucose-Capillary 143 (*) 70 - 99 mg/dL  GLUCOSE, CAPILLARY   Collection Time    01/09/13  1:20 PM      Result Value Range   Glucose-Capillary 104 (*) 70 - 99 mg/dL   Comment 1 Notify RN    GLUCOSE, CAPILLARY   Collection Time    01/09/13  6:12 PM      Result Value Range   Glucose-Capillary 107 (*) 70 - 99 mg/dL  Results for orders placed during the hospital encounter of 10/09/12 (from the past 8736 hour(s))  GLUCOSE, CAPILLARY   Collection Time    10/09/12  9:30 PM      Result Value Range   Glucose-Capillary 131 (*) 70 - 99 mg/dL  GLUCOSE, CAPILLARY   Collection Time    10/10/12  6:45 AM      Result Value Range   Glucose-Capillary 135 (*) 70 - 99 mg/dL  GLUCOSE, CAPILLARY   Collection Time    10/10/12 11:42 AM       Result Value Range   Glucose-Capillary 102 (*) 70 - 99 mg/dL  GLUCOSE, CAPILLARY   Collection Time    10/10/12  4:51 PM      Result Value Range   Glucose-Capillary 157 (*) 70 - 99 mg/dL   Comment 1 Notify RN     Comment 2 Documented in Chart    GLUCOSE, CAPILLARY   Collection Time    10/12/12  8:00 PM      Result Value Range   Glucose-Capillary 157 (*) 70 - 99 mg/dL   Comment 1 Notify RN  URINALYSIS, ROUTINE W REFLEX MICROSCOPIC   Collection Time    10/09/12 12:21 PM      Result Value Range   Color, Urine YELLOW  YELLOW   APPearance CLOUDY (*) CLEAR   Specific Gravity, Urine 1.014  1.005 - 1.030   pH 5.5  5.0 - 8.0   Glucose, UA NEGATIVE  NEGATIVE mg/dL   Hgb urine dipstick NEGATIVE  NEGATIVE   Bilirubin Urine NEGATIVE  NEGATIVE   Ketones, ur NEGATIVE  NEGATIVE mg/dL   Protein, ur NEGATIVE  NEGATIVE mg/dL   Urobilinogen, UA 0.2  0.0 - 1.0 mg/dL   Nitrite NEGATIVE  NEGATIVE   Leukocytes, UA LARGE (*) NEGATIVE  URINE RAPID DRUG SCREEN (HOSP PERFORMED)   Collection Time    10/09/12 12:21 PM      Result Value Range   Opiates NONE DETECTED  NONE DETECTED   Cocaine NONE DETECTED  NONE DETECTED   Benzodiazepines POSITIVE (*) NONE DETECTED   Amphetamines NONE DETECTED  NONE DETECTED   Tetrahydrocannabinol NONE DETECTED  NONE DETECTED   Barbiturates NONE DETECTED  NONE DETECTED  URINE MICROSCOPIC-ADD ON   Collection Time    10/09/12 12:21 PM      Result Value Range   Squamous Epithelial / LPF RARE  RARE   WBC, UA 11-20  <3 WBC/hpf   Bacteria, UA MANY (*) RARE  CBC WITH DIFFERENTIAL   Collection Time    10/09/12 12:22 PM      Result Value Range   WBC 8.6  4.0 - 10.5 K/uL   RBC 5.61 (*) 3.87 - 5.11 MIL/uL   Hemoglobin 14.5  12.0 - 15.0 g/dL   HCT 16.1  09.6 - 04.5 %   MCV 78.8  78.0 - 100.0 fL   MCH 25.8 (*) 26.0 - 34.0 pg   MCHC 32.8  30.0 - 36.0 g/dL   RDW 40.9  81.1 - 91.4 %   Platelets 225  150 - 400 K/uL   Neutrophils Relative 60  43 - 77 %   Neutro Abs 5.2   1.7 - 7.7 K/uL   Lymphocytes Relative 31  12 - 46 %   Lymphs Abs 2.7  0.7 - 4.0 K/uL   Monocytes Relative 6  3 - 12 %   Monocytes Absolute 0.5  0.1 - 1.0 K/uL   Eosinophils Relative 2  0 - 5 %   Eosinophils Absolute 0.2  0.0 - 0.7 K/uL   Basophils Relative 0  0 - 1 %   Basophils Absolute 0.0  0.0 - 0.1 K/uL  ETHANOL   Collection Time    10/09/12 12:22 PM      Result Value Range   Alcohol, Ethyl (B) <11  0 - 11 mg/dL  BASIC METABOLIC PANEL   Collection Time    10/09/12 12:22 PM      Result Value Range   Sodium 138  135 - 145 mEq/L   Potassium 3.9  3.5 - 5.1 mEq/L   Chloride 97  96 - 112 mEq/L   CO2 29  19 - 32 mEq/L   Glucose, Bld 110 (*) 70 - 99 mg/dL   BUN 12  6 - 23 mg/dL   Creatinine, Ser 7.82  0.50 - 1.10 mg/dL   Calcium 95.6  8.4 - 21.3 mg/dL   GFR calc non Af Amer >90  >90 mL/min   GFR calc Af Amer >90  >90 mL/min  TROPONIN I   Collection Time    10/09/12 12:22 PM  Result Value Range   Troponin I <0.30  <0.30 ng/mL  CARBAMAZEPINE LEVEL, TOTAL   Collection Time    10/09/12 12:26 PM      Result Value Range   Carbamazepine Lvl <0.5 (*) 4.0 - 12.0 ug/mL  URINE CULTURE   Collection Time    10/09/12  1:20 PM      Result Value Range   Specimen Description URINE, CLEAN CATCH     Special Requests NONE     Culture  Setup Time 10/10/2012 01:40     Colony Count 15,000 COLONIES/ML     Culture       Value: Multiple bacterial morphotypes present, none predominant. Suggest appropriate recollection if clinically indicated.   Report Status 10/11/2012 FINAL    Results for orders placed during the hospital encounter of 03/20/12 (from the past 8736 hour(s))  GLUCOSE, CAPILLARY   Collection Time    03/26/12  4:57 PM      Result Value Range   Glucose-Capillary 100 (*) 70 - 99 mg/dL  GLUCOSE, CAPILLARY   Collection Time    03/27/12  6:35 AM      Result Value Range   Glucose-Capillary 120 (*) 70 - 99 mg/dL   Comment 1 Notify RN    GLUCOSE, CAPILLARY   Collection Time     03/27/12 11:34 AM      Result Value Range   Glucose-Capillary 98  70 - 99 mg/dL  URINE RAPID DRUG SCREEN (HOSP PERFORMED)   Collection Time    03/20/12  3:58 PM      Result Value Range   Opiates NONE DETECTED  NONE DETECTED   Cocaine NONE DETECTED  NONE DETECTED   Benzodiazepines POSITIVE (*) NONE DETECTED   Amphetamines NONE DETECTED  NONE DETECTED   Tetrahydrocannabinol NONE DETECTED  NONE DETECTED   Barbiturates NONE DETECTED  NONE DETECTED  PREGNANCY, URINE   Collection Time    03/20/12  3:59 PM      Result Value Range   Preg Test, Ur NEGATIVE  NEGATIVE  CBC   Collection Time    03/20/12  4:50 PM      Result Value Range   WBC 10.4  4.0 - 10.5 K/uL   RBC 5.47 (*) 3.87 - 5.11 MIL/uL   Hemoglobin 14.5  12.0 - 15.0 g/dL   HCT 16.1  09.6 - 04.5 %   MCV 79.7  78.0 - 100.0 fL   MCH 26.5  26.0 - 34.0 pg   MCHC 33.3  30.0 - 36.0 g/dL   RDW 40.9  81.1 - 91.4 %   Platelets 237  150 - 400 K/uL  COMPREHENSIVE METABOLIC PANEL   Collection Time    03/20/12  4:50 PM      Result Value Range   Sodium 134 (*) 135 - 145 mEq/L   Potassium 4.1  3.5 - 5.1 mEq/L   Chloride 98  96 - 112 mEq/L   CO2 24  19 - 32 mEq/L   Glucose, Bld 100 (*) 70 - 99 mg/dL   BUN 16  6 - 23 mg/dL   Creatinine, Ser 7.82  0.50 - 1.10 mg/dL   Calcium 9.5  8.4 - 95.6 mg/dL   Total Protein 7.7  6.0 - 8.3 g/dL   Albumin 4.0  3.5 - 5.2 g/dL   AST 26  0 - 37 U/L   ALT 22  0 - 35 U/L   Alkaline Phosphatase 104  39 - 117 U/L   Total Bilirubin 0.2 (*)  0.3 - 1.2 mg/dL   GFR calc non Af Amer 83 (*) >90 mL/min   GFR calc Af Amer >90  >90 mL/min  ETHANOL   Collection Time    03/20/12  4:50 PM      Result Value Range   Alcohol, Ethyl (B) <11  0 - 11 mg/dL    Physical Findings: AIMS:  , ,  ,  ,    CIWA:    COWS:     Treatment Plan Summary: Daily contact with patient to assess and evaluate symptoms and progress in treatment Medication management  Plan/Discussion: I took her vitals.  I reviewed CC,  tobacco/med/surg Hx, meds effects/ side effects, problem list, therapies and responses as well as current situation/symptoms discussed options. Have pt get back with me on what she wants to do.  Switch back to Xanax. See orders and pt instructions for more details.  Medical Decision Making Problem Points:  Established problem, worsening (2), New problem, with no additional work-up planned (3), Review of last therapy session (1) and Review of psycho-social stressors (1) Data Points:  Review or order clinical lab tests (1) Review of medication regiment & side effects (2)  I certify that outpatient services furnished can reasonably be expected to improve the patient's condition.   Orson Aloe, MD, [facility]
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
## 2013-03-23 ENCOUNTER — Ambulatory Visit (INDEPENDENT_AMBULATORY_CARE_PROVIDER_SITE_OTHER): Payer: Medicare Other | Admitting: Psychiatry

## 2013-03-23 ENCOUNTER — Encounter (HOSPITAL_COMMUNITY): Payer: Self-pay | Admitting: Psychiatry

## 2013-03-23 VITALS — Wt 212.0 lb

## 2013-03-23 DIAGNOSIS — F332 Major depressive disorder, recurrent severe without psychotic features: Secondary | ICD-10-CM

## 2013-03-23 DIAGNOSIS — F429 Obsessive-compulsive disorder, unspecified: Secondary | ICD-10-CM | POA: Diagnosis not present

## 2013-03-23 DIAGNOSIS — T7422XS Child sexual abuse, confirmed, sequela: Secondary | ICD-10-CM

## 2013-03-23 DIAGNOSIS — F410 Panic disorder [episodic paroxysmal anxiety] without agoraphobia: Secondary | ICD-10-CM

## 2013-03-23 DIAGNOSIS — F603 Borderline personality disorder: Secondary | ICD-10-CM

## 2013-03-23 DIAGNOSIS — E559 Vitamin D deficiency, unspecified: Secondary | ICD-10-CM

## 2013-03-23 DIAGNOSIS — F339 Major depressive disorder, recurrent, unspecified: Secondary | ICD-10-CM

## 2013-03-23 MED ORDER — ALPRAZOLAM 0.25 MG PO TABS: 0.2500 mg | ORAL_TABLET | Freq: Three times a day (TID) | ORAL | Status: DC | PRN

## 2013-03-23 MED ORDER — VENLAFAXINE HCL ER 150 MG PO CP24: 150.0000 mg | ORAL_CAPSULE | Freq: Every day | ORAL | Status: DC

## 2013-03-23 MED ORDER — ARIPIPRAZOLE 2 MG PO TABS: 2.0000 mg | ORAL_TABLET | Freq: Every day | ORAL | Status: DC

## 2013-03-23 MED ORDER — LAMOTRIGINE 25 MG PO TABS: 50.0000 mg | ORAL_TABLET | Freq: Every day | ORAL | Status: DC

## 2013-03-23 NOTE — Progress Notes (Addendum)
[facility] [address] Progress Note AKEMI OVERHOLSER MRN: [id number] DOB: 1966-01-29 Age: 47 y.o.  Date: 03/01/2013 Start Time: 11:30 AM End Time: 12:12 PM  Chief Complaint: Chief Complaint  Patient presents with  . Depression  . Follow-up  . Medication Refill   Subjective:   "I've not had any suicidal thoughts even with the move where everything went wrong". Depression 7/10 and Anxiety 5/10, where 1 is the best and 10 is the worst.  Pain is 3/10 from headache.  Pt comes for her follow-up appointment.  Pt reports that she is compliant with the psychotropic medications with fair benefit and some side effects.  She is having weight gain now back on the Abilify.  Pt brought up the notion of [place] stopping Effexor and trying something else.  Discussed Wellbutrin and looking at her thyroid status.  She is thinking that she will take her Klonopin as usual and then adding the Xanax for the actual trip.  She feels that she will not appear drunk and embarrass her daughter.  She is struggling with the friend who she is traveling with who wants to go out and eat all the time.  Discussed her fears as being thoughts that our brain pumps and that fear is fertilizer for our weeds negative thoughts.  She divulged that she likes to listen to the ocean.  Offered [employer] as an option.  Diagnosis:   Axis I: Major Depression, Recurrent severe and Obsessive Compulsive Disorder Axis II: Borderline Personality Dis. Axis III:  Past Medical History  Diagnosis Date  . Hypertension   . Diabetes mellitus   . Arthritis   . Depression   . Anxiety   . Headache   . History of borderline personality disorder   . Personality disorder    Axis IV: other psychosocial or environmental problems Axis V: 51-60 moderate symptoms  ADL's:  Intact  Sleep: Good  Appetite:  Too good perhaps related to being back on Abilify   Psychiatric Specialty Exam: Review of Systems  Constitutional:  Negative.   Eyes: Negative.   Respiratory: Negative.   Cardiovascular: Negative.   Gastrointestinal: Negative.   Genitourinary: Negative.   Musculoskeletal: Negative.   Skin: Negative.   Neurological: Positive for headaches.  Endo/Heme/Allergies: Negative.   Psychiatric/Behavioral: Positive for depression. [name].     Wt 211 lb 6.4 oz (95.89 kg)  BMI 36.27 kg/m2 Body mass index is 36.27 kg/(m^2).  General Appearance: Casual  Eye Contact::  Fair  Speech:  Normal Rate  Volume:  Normal  Mood:  Very distarught  Affect:  Constricted  Thought Process:  Coherent  Orientation:  Full (Time, Place, and Person)  Thought Content:  WDL  Suicidal Thoughts:  No  Homicidal Thoughts:  No  Memory:  Immediate;   Fair Recent;   Fair Remote;   Fair  Judgement:  Fair  Insight:  Fair  Psychomotor Activity:  Normal  Concentration:  Fair  Recall:  Fair  Akathisia:  No  Handed:  Right  AIMS (if indicated):     Assets:  Communication Skills Desire for Improvement Housing Social Support  Sleep:  16 in the last 24 hours.   Current Medications: Effexor XR 150 mg every AM Lamictal 25 mg 2 every AM Klonopin 0.5 mg BID-TID Abilify 2 mg every bed time  Lab Results:  Lab Results:  Results for orders placed during the hospital encounter of 01/09/13 (from the past 8736 hour(s))  GLUCOSE, CAPILLARY  Collection Time    01/10/13  6:21 AM      Result Value Range   Glucose-Capillary 146 (*) 70 - 99 mg/dL  GLUCOSE, CAPILLARY   Collection Time    01/10/13 11:34 AM      Result Value Range   Glucose-Capillary 120 (*) 70 - 99 mg/dL   Comment 1 Notify RN     Comment 2 Documented in Chart    GLUCOSE, CAPILLARY   Collection Time    01/10/13  5:10 PM      Result Value Range   Glucose-Capillary 114 (*) 70 - 99 mg/dL  GLUCOSE, CAPILLARY   Collection Time    01/11/13  5:58 AM      Result Value Range   Glucose-Capillary 167 (*) 70 - 99 mg/dL   Comment 1 Notify RN      Comment 2 Documented in Chart    GLUCOSE, CAPILLARY   Collection Time    01/11/13 11:45 AM      Result Value Range   Glucose-Capillary 123 (*) 70 - 99 mg/dL  GLUCOSE, CAPILLARY   Collection Time    01/11/13  5:09 PM      Result Value Range   Glucose-Capillary 172 (*) 70 - 99 mg/dL  GLUCOSE, CAPILLARY   Collection Time    01/11/13  9:05 PM      Result Value Range   Glucose-Capillary 114 (*) 70 - 99 mg/dL  GLUCOSE, CAPILLARY   Collection Time    01/12/13 11:58 AM      Result Value Range   Glucose-Capillary 86  70 - 99 mg/dL  Results for orders placed during the hospital encounter of 01/08/13 (from the past 8736 hour(s))  CBC WITH DIFFERENTIAL   Collection Time    01/08/13  2:31 PM      Result Value Range   WBC 7.6  4.0 - 10.5 K/uL   RBC 5.19 (*) 3.87 - 5.11 MIL/uL   Hemoglobin 13.4  12.0 - 15.0 g/dL   HCT 16.1  09.6 - 04.5 %   MCV 78.4  78.0 - 100.0 fL   MCH 25.8 (*) 26.0 - 34.0 pg   MCHC 32.9  30.0 - 36.0 g/dL   RDW 40.9  81.1 - 91.4 %   Platelets 225  150 - 400 K/uL   Neutrophils Relative 60  43 - 77 %   Neutro Abs 4.6  1.7 - 7.7 K/uL   Lymphocytes Relative 32  12 - 46 %   Lymphs Abs 2.4  0.7 - 4.0 K/uL   Monocytes Relative 6  3 - 12 %   Monocytes Absolute 0.4  0.1 - 1.0 K/uL   Eosinophils Relative 2  0 - 5 %   Eosinophils Absolute 0.1  0.0 - 0.7 K/uL   Basophils Relative 1  0 - 1 %   Basophils Absolute 0.1  0.0 - 0.1 K/uL  COMPREHENSIVE METABOLIC PANEL   Collection Time    01/08/13  2:31 PM      Result Value Range   Sodium 139  135 - 145 mEq/L   Potassium 4.1  3.5 - 5.1 mEq/L   Chloride 104  96 - 112 mEq/L   CO2 24  19 - 32 mEq/L   Glucose, Bld 120 (*) 70 - 99 mg/dL   BUN 12  6 - 23 mg/dL   Creatinine, Ser 7.82  0.50 - 1.10 mg/dL   Calcium 9.6  8.4 - 95.6 mg/dL   Total Protein 6.8  6.0 -  8.3 g/dL   Albumin 3.2 (*) 3.5 - 5.2 g/dL   AST 30  0 - 37 U/L   ALT 18  0 - 35 U/L   Alkaline Phosphatase 90  39 - 117 U/L   Total Bilirubin 0.2 (*) 0.3 - 1.2 mg/dL    GFR calc non Af Amer >90  >90 mL/min   GFR calc Af Amer >90  >90 mL/min  URINALYSIS, ROUTINE W REFLEX MICROSCOPIC   Collection Time    01/08/13  2:33 PM      Result Value Range   Color, Urine YELLOW  YELLOW   APPearance CLOUDY (*) CLEAR   Specific Gravity, Urine 1.029  1.005 - 1.030   pH 5.5  5.0 - 8.0   Glucose, UA NEGATIVE  NEGATIVE mg/dL   Hgb urine dipstick NEGATIVE  NEGATIVE   Bilirubin Urine NEGATIVE  NEGATIVE   Ketones, ur NEGATIVE  NEGATIVE mg/dL   Protein, ur NEGATIVE  NEGATIVE mg/dL   Urobilinogen, UA 1.0  0.0 - 1.0 mg/dL   Nitrite NEGATIVE  NEGATIVE   Leukocytes, UA NEGATIVE  NEGATIVE  URINE RAPID DRUG SCREEN (HOSP PERFORMED)   Collection Time    01/08/13  2:33 PM      Result Value Range   Opiates NONE DETECTED  NONE DETECTED   Cocaine NONE DETECTED  NONE DETECTED   Benzodiazepines POSITIVE (*) NONE DETECTED   Amphetamines NONE DETECTED  NONE DETECTED   Tetrahydrocannabinol NONE DETECTED  NONE DETECTED   Barbiturates NONE DETECTED  NONE DETECTED  GLUCOSE, CAPILLARY   Collection Time    01/08/13  4:44 PM      Result Value Range   Glucose-Capillary 135 (*) 70 - 99 mg/dL  GLUCOSE, CAPILLARY   Collection Time    01/08/13  9:52 PM      Result Value Range   Glucose-Capillary 98  70 - 99 mg/dL  GLUCOSE, CAPILLARY   Collection Time    01/09/13  8:10 AM      Result Value Range   Glucose-Capillary 143 (*) 70 - 99 mg/dL  GLUCOSE, CAPILLARY   Collection Time    01/09/13  1:20 PM      Result Value Range   Glucose-Capillary 104 (*) 70 - 99 mg/dL   Comment 1 Notify RN    GLUCOSE, CAPILLARY   Collection Time    01/09/13  6:12 PM      Result Value Range   Glucose-Capillary 107 (*) 70 - 99 mg/dL  Results for orders placed during the hospital encounter of 10/09/12 (from the past 8736 hour(s))  GLUCOSE, CAPILLARY   Collection Time    10/09/12  9:30 PM      Result Value Range   Glucose-Capillary 131 (*) 70 - 99 mg/dL  GLUCOSE, CAPILLARY   Collection Time     10/10/12  6:45 AM      Result Value Range   Glucose-Capillary 135 (*) 70 - 99 mg/dL  GLUCOSE, CAPILLARY   Collection Time    10/10/12 11:42 AM      Result Value Range   Glucose-Capillary 102 (*) 70 - 99 mg/dL  GLUCOSE, CAPILLARY   Collection Time    10/10/12  4:51 PM      Result Value Range   Glucose-Capillary 157 (*) 70 - 99 mg/dL   Comment 1 Notify RN     Comment 2 Documented in Chart    GLUCOSE, CAPILLARY   Collection Time    10/12/12  8:00 PM  Result Value Range   Glucose-Capillary 157 (*) 70 - 99 mg/dL   Comment 1 Notify RN    URINALYSIS, ROUTINE W REFLEX MICROSCOPIC   Collection Time    10/09/12 12:21 PM      Result Value Range   Color, Urine YELLOW  YELLOW   APPearance CLOUDY (*) CLEAR   Specific Gravity, Urine 1.014  1.005 - 1.030   pH 5.5  5.0 - 8.0   Glucose, UA NEGATIVE  NEGATIVE mg/dL   Hgb urine dipstick NEGATIVE  NEGATIVE   Bilirubin Urine NEGATIVE  NEGATIVE   Ketones, ur NEGATIVE  NEGATIVE mg/dL   Protein, ur NEGATIVE  NEGATIVE mg/dL   Urobilinogen, UA 0.2  0.0 - 1.0 mg/dL   Nitrite NEGATIVE  NEGATIVE   Leukocytes, UA LARGE (*) NEGATIVE  URINE RAPID DRUG SCREEN (HOSP PERFORMED)   Collection Time    10/09/12 12:21 PM      Result Value Range   Opiates NONE DETECTED  NONE DETECTED   Cocaine NONE DETECTED  NONE DETECTED   Benzodiazepines POSITIVE (*) NONE DETECTED   Amphetamines NONE DETECTED  NONE DETECTED   Tetrahydrocannabinol NONE DETECTED  NONE DETECTED   Barbiturates NONE DETECTED  NONE DETECTED  URINE MICROSCOPIC-ADD ON   Collection Time    10/09/12 12:21 PM      Result Value Range   Squamous Epithelial / LPF RARE  RARE   WBC, UA 11-20  <3 WBC/hpf   Bacteria, UA MANY (*) RARE  CBC WITH DIFFERENTIAL   Collection Time    10/09/12 12:22 PM      Result Value Range   WBC 8.6  4.0 - 10.5 K/uL   RBC 5.61 (*) 3.87 - 5.11 MIL/uL   Hemoglobin 14.5  12.0 - 15.0 g/dL   HCT 09.6  04.5 - 40.9 %   MCV 78.8  78.0 - 100.0 fL   MCH 25.8 (*) 26.0 -  34.0 pg   MCHC 32.8  30.0 - 36.0 g/dL   RDW 81.1  91.4 - 78.2 %   Platelets 225  150 - 400 K/uL   Neutrophils Relative 60  43 - 77 %   Neutro Abs 5.2  1.7 - 7.7 K/uL   Lymphocytes Relative 31  12 - 46 %   Lymphs Abs 2.7  0.7 - 4.0 K/uL   Monocytes Relative 6  3 - 12 %   Monocytes Absolute 0.5  0.1 - 1.0 K/uL   Eosinophils Relative 2  0 - 5 %   Eosinophils Absolute 0.2  0.0 - 0.7 K/uL   Basophils Relative 0  0 - 1 %   Basophils Absolute 0.0  0.0 - 0.1 K/uL  ETHANOL   Collection Time    10/09/12 12:22 PM      Result Value Range   Alcohol, Ethyl (B) <11  0 - 11 mg/dL  BASIC METABOLIC PANEL   Collection Time    10/09/12 12:22 PM      Result Value Range   Sodium 138  135 - 145 mEq/L   Potassium 3.9  3.5 - 5.1 mEq/L   Chloride 97  96 - 112 mEq/L   CO2 29  19 - 32 mEq/L   Glucose, Bld 110 (*) 70 - 99 mg/dL   BUN 12  6 - 23 mg/dL   Creatinine, Ser 9.56  0.50 - 1.10 mg/dL   Calcium 21.3  8.4 - 08.6 mg/dL   GFR calc non Af Amer >90  >90 mL/min   GFR  calc Af Amer >90  >90 mL/min  TROPONIN I   Collection Time    10/09/12 12:22 PM      Result Value Range   Troponin I <0.30  <0.30 ng/mL  CARBAMAZEPINE LEVEL, TOTAL   Collection Time    10/09/12 12:26 PM      Result Value Range   Carbamazepine Lvl <0.5 (*) 4.0 - 12.0 ug/mL  URINE CULTURE   Collection Time    10/09/12  1:20 PM      Result Value Range   Specimen Description URINE, CLEAN CATCH     Special Requests NONE     Culture  Setup Time 10/10/2012 01:40     Colony Count 15,000 COLONIES/ML     Culture       Value: Multiple bacterial morphotypes present, none predominant. Suggest appropriate recollection if clinically indicated.   Report Status 10/11/2012 FINAL    Results for orders placed during the hospital encounter of 03/20/12 (from the past 8736 hour(s))  GLUCOSE, CAPILLARY   Collection Time    03/26/12  4:57 PM      Result Value Range   Glucose-Capillary 100 (*) 70 - 99 mg/dL  GLUCOSE, CAPILLARY   Collection Time     03/27/12  6:35 AM      Result Value Range   Glucose-Capillary 120 (*) 70 - 99 mg/dL   Comment 1 Notify RN    GLUCOSE, CAPILLARY   Collection Time    03/27/12 11:34 AM      Result Value Range   Glucose-Capillary 98  70 - 99 mg/dL    Physical Findings: AIMS:  , ,  ,  ,    CIWA:    COWS:     Treatment Plan Summary: Daily contact with patient to assess and evaluate symptoms and progress in treatment Medication management  Plan/Discussion: I took her vitals.  I reviewed CC, tobacco/med/surg Hx, meds effects/ side effects, problem list, therapies and responses as well as current situation/symptoms discussed options. Continue current effective medications.  Try downloading the music that she looked up on YouTube and listen to that when she eats in her room. Finish Klonopin and then shift to Xanax. See orders and pt instructions for more details.  Medical Decision Making Problem Points:  Established problem, worsening (2), New problem, with no additional work-up planned (3), Review of last therapy session (1) and Review of psycho-social stressors (1) Data Points:  Review or order clinical lab tests (1) Review of medication regiment & side effects (2)  I certify that outpatient services furnished can reasonably be expected to improve the patient's condition.   Orson Aloe, MD, [facility]  Addendum:  03/24/2013 Labs indicate thyroid in typical range.  No answer on home phone to discuss adding low dose [place] Thyroid. Orson Aloe, MD, [facility]

## 2013-03-23 NOTE — Patient Instructions (Signed)
On [employer] the internet site with lots of music, listen to the Chill channel or list Ralene Cork or [place] [employer] as a starting point.    GET SERIOUS about taking care of yourself.  Do the next right thing and that often means doing something to care for yourself along the lines of are you hungry, are you angry, are you lonely, are you tired, are you scared?  Take care of yourself.  No one else is standing up to do the job and only you know what you need.   Call if problems or concerns.

## 2013-03-24 ENCOUNTER — Other Ambulatory Visit (HOSPITAL_COMMUNITY): Payer: Self-pay | Admitting: Psychiatry

## 2013-03-24 DIAGNOSIS — F339 Major depressive disorder, recurrent, unspecified: Secondary | ICD-10-CM

## 2013-03-24 NOTE — Telephone Encounter (Signed)
Somehow pharmacy needed another order for the Effexor, sent via eScript

## 2013-03-29 ENCOUNTER — Ambulatory Visit (HOSPITAL_COMMUNITY): Payer: Self-pay | Admitting: Psychiatry

## 2013-04-21 ENCOUNTER — Other Ambulatory Visit: Payer: Self-pay | Admitting: Obstetrics & Gynecology

## 2013-04-21 ENCOUNTER — Ambulatory Visit (INDEPENDENT_AMBULATORY_CARE_PROVIDER_SITE_OTHER): Payer: Medicare Other | Admitting: Psychiatry

## 2013-04-21 ENCOUNTER — Encounter (HOSPITAL_COMMUNITY): Payer: Self-pay | Admitting: Psychiatry

## 2013-04-21 VITALS — Wt 214.0 lb

## 2013-04-21 DIAGNOSIS — F410 Panic disorder [episodic paroxysmal anxiety] without agoraphobia: Secondary | ICD-10-CM

## 2013-04-21 DIAGNOSIS — T7422XS Child sexual abuse, confirmed, sequela: Secondary | ICD-10-CM

## 2013-04-21 DIAGNOSIS — F332 Major depressive disorder, recurrent severe without psychotic features: Secondary | ICD-10-CM

## 2013-04-21 DIAGNOSIS — F429 Obsessive-compulsive disorder, unspecified: Secondary | ICD-10-CM | POA: Diagnosis not present

## 2013-04-21 DIAGNOSIS — R45851 Suicidal ideations: Secondary | ICD-10-CM

## 2013-04-21 DIAGNOSIS — F603 Borderline personality disorder: Secondary | ICD-10-CM

## 2013-04-21 DIAGNOSIS — F339 Major depressive disorder, recurrent, unspecified: Secondary | ICD-10-CM

## 2013-04-21 MED ORDER — VENLAFAXINE HCL ER 150 MG PO CP24: ORAL_CAPSULE | ORAL | Status: DC

## 2013-04-21 MED ORDER — BUPROPION HCL 75 MG PO TABS: ORAL_TABLET | ORAL | Status: DC

## 2013-04-21 MED ORDER — CLONAZEPAM 0.5 MG PO TABS: ORAL_TABLET | ORAL | Status: DC

## 2013-04-21 MED ORDER — LAMOTRIGINE 25 MG PO TABS: 50.0000 mg | ORAL_TABLET | Freq: Every day | ORAL | Status: DC

## 2013-04-21 NOTE — Patient Instructions (Signed)
Set a timer for 4 or a certain number minutes and dance for that amount of time in the house or in the yard.  Mark the number of minutes on a calendar for that day.  Do that every day this week.  Then next week increase the time by 1 minutes and then mark the calendar with the number of minutes for that day.  Each week increase your exercise by one minute.  Keep a record of this so you can see the progress you are making.  Do this every day, just like eating and sleeping.  It is good for pain control, depression, and for your soul/spirit.  Bring the record in for your next visit so we can talk about your effort and how you feel with the new exercise program going and working for you.  Take care of yourself.  No one else is standing up to do the job and only you know what you need.   GET SERIOUS about taking care of yourself.  Do the next right thing and that often means doing something to care for yourself along the lines of are you hungry, are you angry, are you lonely, are you tired, are you scared?  [employer] is what that stands for.  Call if problems or concerns.

## 2013-04-21 NOTE — Progress Notes (Signed)
[facility] [address] Progress Note TULA SCHRYVER MRN: [id number] DOB: Jun 03, 1966 Age: 47 y.o.  Date: 03/01/2013 Start Time: 10:40  AM End Time: 11:06 AM  Chief Complaint: Chief Complaint  Patient presents with  . Depression  . Follow-up  . Medication Refill   Subjective:   "I've been very depressed and have no urge to get out any more". Depression 10/10 and Anxiety 10/10, where 1 is the best and 10 is the worst.  Pain is 0/10.  Some dizziness.  Pt comes for her follow-up appointment.  Pt reports that she is compliant with the psychotropic medications with poor benefit and some side effects.  She is having more weight gain.    She appears most depressed and tearful today.  Discussed her going back into the hospital for some meds adjustments, but she has no one to watch her daughter.  Past meds include: Cymbalta, Prozac, Tofranil, Symbax, Zoloft, Paxil, Celexa, Lexapro, Remeron, Trazodone, Pamelor, Neurontin, Topamax, Risperdal, Zyprexa.  Where Zyprexa was the most effective, but caused considerable weight gain.  Will try adding Wellbutrin to current regiment to see if that cn help boost her past some of the depression without causing more weight gain.   Diagnosis:   Axis I: Major Depression, Recurrent severe and Obsessive Compulsive Disorder Axis II: Borderline Personality Dis. Axis III:  Past Medical History  Diagnosis Date  . Hypertension   . Diabetes mellitus   . Arthritis   . Depression   . Anxiety   . Headache   . History of borderline personality disorder   . Personality disorder    Axis IV: other psychosocial or environmental problems Axis V: 51-60 moderate symptoms  ADL's:  Intact  Sleep: Good  Appetite:  Too good perhaps related to being back on Abilify   Psychiatric Specialty Exam: Review of Systems  Constitutional: Negative.   Eyes: Negative.   Respiratory: Negative.   Cardiovascular: Negative.   Gastrointestinal: Negative.   Genitourinary:  Negative.   Musculoskeletal: Negative.   Skin: Negative.   Neurological: Positive for headaches.  Endo/Heme/Allergies: Negative.   Psychiatric/Behavioral: Positive for depression. [name].     Wt 211 lb 6.4 oz (95.89 kg)  BMI 36.27 kg/m2 Body mass index is 36.27 kg/(m^2).  General Appearance: Casual  Eye Contact::  Fair  Speech:  Normal Rate  Volume:  Normal  Mood:  Very distarught  Affect:  Constricted  Thought Process:  Coherent  Orientation:  Full (Time, Place, and Person)  Thought Content:  WDL  Suicidal Thoughts:  No  Homicidal Thoughts:  No  Memory:  Immediate;   Fair Recent;   Fair Remote;   Fair  Judgement:  Fair  Insight:  Fair  Psychomotor Activity:  Normal  Concentration:  Fair  Recall:  Fair  Akathisia:  No  Handed:  Right  AIMS (if indicated):     Assets:  Communication Skills Desire for Improvement Housing Social Support  Sleep:  16 in the last 24 hours.   Current Medications: Effexor XR 150 mg every AM Lamictal 25 mg 2 every AM Klonopin 0.5 mg BID-TID Abilify 2 mg every bed time  Lab Results:  Lab Results:  Results for orders placed in visit on 03/23/13 (from the past 8736 hour(s))  T3, FREE   Collection Time    03/23/13 12:13 PM      Result Value Range   T3, Free 2.8  2.3 - 4.2 pg/mL  T4, FREE   Collection Time  03/23/13 12:13 PM      Result Value Range   Free T4 1.36  0.80 - 1.80 ng/dL  Results for orders placed during the hospital encounter of 01/09/13 (from the past 8736 hour(s))  GLUCOSE, CAPILLARY   Collection Time    01/10/13  6:21 AM      Result Value Range   Glucose-Capillary 146 (*) 70 - 99 mg/dL  GLUCOSE, CAPILLARY   Collection Time    01/10/13 11:34 AM      Result Value Range   Glucose-Capillary 120 (*) 70 - 99 mg/dL   Comment 1 Notify RN     Comment 2 Documented in Chart    GLUCOSE, CAPILLARY   Collection Time    01/10/13  5:10 PM      Result Value Range   Glucose-Capillary 114 (*) 70 - 99  mg/dL  GLUCOSE, CAPILLARY   Collection Time    01/11/13  5:58 AM      Result Value Range   Glucose-Capillary 167 (*) 70 - 99 mg/dL   Comment 1 Notify RN     Comment 2 Documented in Chart    GLUCOSE, CAPILLARY   Collection Time    01/11/13 11:45 AM      Result Value Range   Glucose-Capillary 123 (*) 70 - 99 mg/dL  GLUCOSE, CAPILLARY   Collection Time    01/11/13  5:09 PM      Result Value Range   Glucose-Capillary 172 (*) 70 - 99 mg/dL  GLUCOSE, CAPILLARY   Collection Time    01/11/13  9:05 PM      Result Value Range   Glucose-Capillary 114 (*) 70 - 99 mg/dL  GLUCOSE, CAPILLARY   Collection Time    01/12/13 11:58 AM      Result Value Range   Glucose-Capillary 86  70 - 99 mg/dL  Results for orders placed during the hospital encounter of 01/08/13 (from the past 8736 hour(s))  CBC WITH DIFFERENTIAL   Collection Time    01/08/13  2:31 PM      Result Value Range   WBC 7.6  4.0 - 10.5 K/uL   RBC 5.19 (*) 3.87 - 5.11 MIL/uL   Hemoglobin 13.4  12.0 - 15.0 g/dL   HCT 16.1  09.6 - 04.5 %   MCV 78.4  78.0 - 100.0 fL   MCH 25.8 (*) 26.0 - 34.0 pg   MCHC 32.9  30.0 - 36.0 g/dL   RDW 40.9  81.1 - 91.4 %   Platelets 225  150 - 400 K/uL   Neutrophils Relative 60  43 - 77 %   Neutro Abs 4.6  1.7 - 7.7 K/uL   Lymphocytes Relative 32  12 - 46 %   Lymphs Abs 2.4  0.7 - 4.0 K/uL   Monocytes Relative 6  3 - 12 %   Monocytes Absolute 0.4  0.1 - 1.0 K/uL   Eosinophils Relative 2  0 - 5 %   Eosinophils Absolute 0.1  0.0 - 0.7 K/uL   Basophils Relative 1  0 - 1 %   Basophils Absolute 0.1  0.0 - 0.1 K/uL  COMPREHENSIVE METABOLIC PANEL   Collection Time    01/08/13  2:31 PM      Result Value Range   Sodium 139  135 - 145 mEq/L   Potassium 4.1  3.5 - 5.1 mEq/L   Chloride 104  96 - 112 mEq/L   CO2 24  19 - 32 mEq/L   Glucose, Bld  120 (*) 70 - 99 mg/dL   BUN 12  6 - 23 mg/dL   Creatinine, Ser 1.61  0.50 - 1.10 mg/dL   Calcium 9.6  8.4 - 09.6 mg/dL   Total Protein 6.8  6.0 - 8.3 g/dL    Albumin 3.2 (*) 3.5 - 5.2 g/dL   AST 30  0 - 37 U/L   ALT 18  0 - 35 U/L   Alkaline Phosphatase 90  39 - 117 U/L   Total Bilirubin 0.2 (*) 0.3 - 1.2 mg/dL   GFR calc non Af Amer >90  >90 mL/min   GFR calc Af Amer >90  >90 mL/min  URINALYSIS, ROUTINE W REFLEX MICROSCOPIC   Collection Time    01/08/13  2:33 PM      Result Value Range   Color, Urine YELLOW  YELLOW   APPearance CLOUDY (*) CLEAR   Specific Gravity, Urine 1.029  1.005 - 1.030   pH 5.5  5.0 - 8.0   Glucose, UA NEGATIVE  NEGATIVE mg/dL   Hgb urine dipstick NEGATIVE  NEGATIVE   Bilirubin Urine NEGATIVE  NEGATIVE   Ketones, ur NEGATIVE  NEGATIVE mg/dL   Protein, ur NEGATIVE  NEGATIVE mg/dL   Urobilinogen, UA 1.0  0.0 - 1.0 mg/dL   Nitrite NEGATIVE  NEGATIVE   Leukocytes, UA NEGATIVE  NEGATIVE  URINE RAPID DRUG SCREEN (HOSP PERFORMED)   Collection Time    01/08/13  2:33 PM      Result Value Range   Opiates NONE DETECTED  NONE DETECTED   Cocaine NONE DETECTED  NONE DETECTED   Benzodiazepines POSITIVE (*) NONE DETECTED   Amphetamines NONE DETECTED  NONE DETECTED   Tetrahydrocannabinol NONE DETECTED  NONE DETECTED   Barbiturates NONE DETECTED  NONE DETECTED  GLUCOSE, CAPILLARY   Collection Time    01/08/13  4:44 PM      Result Value Range   Glucose-Capillary 135 (*) 70 - 99 mg/dL  GLUCOSE, CAPILLARY   Collection Time    01/08/13  9:52 PM      Result Value Range   Glucose-Capillary 98  70 - 99 mg/dL  GLUCOSE, CAPILLARY   Collection Time    01/09/13  8:10 AM      Result Value Range   Glucose-Capillary 143 (*) 70 - 99 mg/dL  GLUCOSE, CAPILLARY   Collection Time    01/09/13  1:20 PM      Result Value Range   Glucose-Capillary 104 (*) 70 - 99 mg/dL   Comment 1 Notify RN    GLUCOSE, CAPILLARY   Collection Time    01/09/13  6:12 PM      Result Value Range   Glucose-Capillary 107 (*) 70 - 99 mg/dL  Results for orders placed during the hospital encounter of 10/09/12 (from the past 8736 hour(s))  GLUCOSE,  CAPILLARY   Collection Time    10/09/12  9:30 PM      Result Value Range   Glucose-Capillary 131 (*) 70 - 99 mg/dL  GLUCOSE, CAPILLARY   Collection Time    10/10/12  6:45 AM      Result Value Range   Glucose-Capillary 135 (*) 70 - 99 mg/dL  GLUCOSE, CAPILLARY   Collection Time    10/10/12 11:42 AM      Result Value Range   Glucose-Capillary 102 (*) 70 - 99 mg/dL  GLUCOSE, CAPILLARY   Collection Time    10/10/12  4:51 PM      Result Value Range  Glucose-Capillary 157 (*) 70 - 99 mg/dL   Comment 1 Notify RN     Comment 2 Documented in Chart    GLUCOSE, CAPILLARY   Collection Time    10/12/12  8:00 PM      Result Value Range   Glucose-Capillary 157 (*) 70 - 99 mg/dL   Comment 1 Notify RN    URINALYSIS, ROUTINE W REFLEX MICROSCOPIC   Collection Time    10/09/12 12:21 PM      Result Value Range   Color, Urine YELLOW  YELLOW   APPearance CLOUDY (*) CLEAR   Specific Gravity, Urine 1.014  1.005 - 1.030   pH 5.5  5.0 - 8.0   Glucose, UA NEGATIVE  NEGATIVE mg/dL   Hgb urine dipstick NEGATIVE  NEGATIVE   Bilirubin Urine NEGATIVE  NEGATIVE   Ketones, ur NEGATIVE  NEGATIVE mg/dL   Protein, ur NEGATIVE  NEGATIVE mg/dL   Urobilinogen, UA 0.2  0.0 - 1.0 mg/dL   Nitrite NEGATIVE  NEGATIVE   Leukocytes, UA LARGE (*) NEGATIVE  URINE RAPID DRUG SCREEN (HOSP PERFORMED)   Collection Time    10/09/12 12:21 PM      Result Value Range   Opiates NONE DETECTED  NONE DETECTED   Cocaine NONE DETECTED  NONE DETECTED   Benzodiazepines POSITIVE (*) NONE DETECTED   Amphetamines NONE DETECTED  NONE DETECTED   Tetrahydrocannabinol NONE DETECTED  NONE DETECTED   Barbiturates NONE DETECTED  NONE DETECTED  URINE MICROSCOPIC-ADD ON   Collection Time    10/09/12 12:21 PM      Result Value Range   Squamous Epithelial / LPF RARE  RARE   WBC, UA 11-20  <3 WBC/hpf   Bacteria, UA MANY (*) RARE  CBC WITH DIFFERENTIAL   Collection Time    10/09/12 12:22 PM      Result Value Range   WBC 8.6  4.0 -  10.5 K/uL   RBC 5.61 (*) 3.87 - 5.11 MIL/uL   Hemoglobin 14.5  12.0 - 15.0 g/dL   HCT 16.1  09.6 - 04.5 %   MCV 78.8  78.0 - 100.0 fL   MCH 25.8 (*) 26.0 - 34.0 pg   MCHC 32.8  30.0 - 36.0 g/dL   RDW 40.9  81.1 - 91.4 %   Platelets 225  150 - 400 K/uL   Neutrophils Relative 60  43 - 77 %   Neutro Abs 5.2  1.7 - 7.7 K/uL   Lymphocytes Relative 31  12 - 46 %   Lymphs Abs 2.7  0.7 - 4.0 K/uL   Monocytes Relative 6  3 - 12 %   Monocytes Absolute 0.5  0.1 - 1.0 K/uL   Eosinophils Relative 2  0 - 5 %   Eosinophils Absolute 0.2  0.0 - 0.7 K/uL   Basophils Relative 0  0 - 1 %   Basophils Absolute 0.0  0.0 - 0.1 K/uL  ETHANOL   Collection Time    10/09/12 12:22 PM      Result Value Range   Alcohol, Ethyl (B) <11  0 - 11 mg/dL  BASIC METABOLIC PANEL   Collection Time    10/09/12 12:22 PM      Result Value Range   Sodium 138  135 - 145 mEq/L   Potassium 3.9  3.5 - 5.1 mEq/L   Chloride 97  96 - 112 mEq/L   CO2 29  19 - 32 mEq/L   Glucose, Bld 110 (*) 70 - 99  mg/dL   BUN 12  6 - 23 mg/dL   Creatinine, Ser 1.61  0.50 - 1.10 mg/dL   Calcium 09.6  8.4 - 04.5 mg/dL   GFR calc non Af Amer >90  >90 mL/min   GFR calc Af Amer >90  >90 mL/min  TROPONIN I   Collection Time    10/09/12 12:22 PM      Result Value Range   Troponin I <0.30  <0.30 ng/mL  CARBAMAZEPINE LEVEL, TOTAL   Collection Time    10/09/12 12:26 PM      Result Value Range   Carbamazepine Lvl <0.5 (*) 4.0 - 12.0 ug/mL  URINE CULTURE   Collection Time    10/09/12  1:20 PM      Result Value Range   Specimen Description URINE, CLEAN CATCH     Special Requests NONE     Culture  Setup Time 10/10/2012 01:40     Colony Count 15,000 COLONIES/ML     Culture       Value: Multiple bacterial morphotypes present, none predominant. Suggest appropriate recollection if clinically indicated.   Report Status 10/11/2012 FINAL      Physical Findings: AIMS:  , ,  ,  ,    CIWA:    COWS:     Treatment Plan Summary: Daily contact  with patient to assess and evaluate symptoms and progress in treatment Medication management  Plan/Discussion: I took her vitals.  I reviewed CC, tobacco/med/surg Hx, meds effects/ side effects, problem list, therapies and responses as well as current situation/symptoms discussed options. Continue current effective medications.  Add Wellbutrin and try to shift off the Abilify See orders and pt instructions for more details.  MEDICATIONS this encounter: Meds ordered this encounter  Medications  . venlafaxine XR (EFFEXOR-XR) 150 MG 24 hr capsule    Sig: take 1 capsule once daily for depression    Dispense:  30 capsule    Refill:  1  . buPROPion (WELLBUTRIN) 75 MG tablet    Sig: Take by mouth one a day for 1st day, two a day for the 2nd and 3rd day, then 3 a day thereafter    Dispense:  90 tablet    Refill:  2  . lamoTRIgine (LAMICTAL) 25 MG tablet    Sig: Take 2 tablets (50 mg total) by mouth daily. For mood stabilization, any higher dose causes panic, migraines, and nausea    Dispense:  60 tablet    Refill:  1    Order Specific Question:  Supervising Provider    Answer:  Geoffery Lyons A [[id number]]  . clonazePAM (KLONOPIN) 0.5 MG tablet    Sig: Take by mouth 2 a day and on the rare occasion 3 a day (TURN SCRIPT FROM HOSPITAL IN AT OFFICE)    Dispense:  75 tablet    Refill:  1    Medical Decision Making Problem Points:  Established problem, worsening (2), New problem, with no additional work-up planned (3), Review of last therapy session (1) and Review of psycho-social stressors (1) Data Points:  Review or order clinical lab tests (1) Review of medication regiment & side effects (2) Review of new medications or change in dosage (2)  I certify that outpatient services furnished can reasonably be expected to improve the patient's condition.   Orson Aloe, MD, [facility]

## 2013-04-22 ENCOUNTER — Encounter (HOSPITAL_COMMUNITY): Payer: Self-pay | Admitting: *Deleted

## 2013-04-22 ENCOUNTER — Telehealth (HOSPITAL_COMMUNITY): Payer: Self-pay | Admitting: Psychiatry

## 2013-04-22 ENCOUNTER — Emergency Department (HOSPITAL_COMMUNITY)
Admission: EM | Admit: 2013-04-22 | Discharge: 2013-04-23 | Disposition: A | Discharge status: Other Acute Inpatient Hospital | Payer: Medicare Other | Source: Home / Self Care | Attending: Emergency Medicine | Admitting: Emergency Medicine

## 2013-04-22 DIAGNOSIS — F4000 Agoraphobia, unspecified: Secondary | ICD-10-CM

## 2013-04-22 DIAGNOSIS — F410 Panic disorder [episodic paroxysmal anxiety] without agoraphobia: Secondary | ICD-10-CM

## 2013-04-22 DIAGNOSIS — F411 Generalized anxiety disorder: Secondary | ICD-10-CM | POA: Diagnosis not present

## 2013-04-22 DIAGNOSIS — Z79899 Other long term (current) drug therapy: Secondary | ICD-10-CM | POA: Diagnosis not present

## 2013-04-22 DIAGNOSIS — F419 Anxiety disorder, unspecified: Secondary | ICD-10-CM

## 2013-04-22 DIAGNOSIS — F339 Major depressive disorder, recurrent, unspecified: Secondary | ICD-10-CM | POA: Diagnosis not present

## 2013-04-22 DIAGNOSIS — F332 Major depressive disorder, recurrent severe without psychotic features: Secondary | ICD-10-CM | POA: Diagnosis not present

## 2013-04-22 DIAGNOSIS — N390 Urinary tract infection, site not specified: Secondary | ICD-10-CM | POA: Diagnosis not present

## 2013-04-22 DIAGNOSIS — R45851 Suicidal ideations: Secondary | ICD-10-CM | POA: Diagnosis not present

## 2013-04-22 DIAGNOSIS — F4310 Post-traumatic stress disorder, unspecified: Secondary | ICD-10-CM | POA: Diagnosis not present

## 2013-04-22 DIAGNOSIS — E119 Type 2 diabetes mellitus without complications: Secondary | ICD-10-CM | POA: Diagnosis not present

## 2013-04-22 DIAGNOSIS — F429 Obsessive-compulsive disorder, unspecified: Secondary | ICD-10-CM | POA: Diagnosis not present

## 2013-04-22 DIAGNOSIS — F329 Major depressive disorder, single episode, unspecified: Secondary | ICD-10-CM

## 2013-04-22 DIAGNOSIS — F341 Dysthymic disorder: Secondary | ICD-10-CM | POA: Diagnosis not present

## 2013-04-22 DIAGNOSIS — I10 Essential (primary) hypertension: Secondary | ICD-10-CM | POA: Diagnosis not present

## 2013-04-22 LAB — COMPREHENSIVE METABOLIC PANEL
ALT: 29 U/L (ref 0–35)
CO2: 26 mEq/L (ref 19–32)
Calcium: 9.8 mg/dL (ref 8.4–10.5)
Chloride: 100 mEq/L (ref 96–112)
Creatinine, Ser: 0.69 mg/dL (ref 0.50–1.10)
GFR calc Af Amer: >90 mL/min (ref >90)
GFR calc non Af Amer: >90 mL/min (ref >90)
Glucose, Bld: 93 mg/dL (ref 70–99)
Sodium: 140 mEq/L (ref 135–145)
Total Bilirubin: 0.2 mg/dL — ABNORMAL LOW (ref 0.3–1.2)

## 2013-04-22 LAB — CBC WITH DIFFERENTIAL/PLATELET
Eosinophils Relative: 2 % (ref 0–5)
HCT: 40.5 % (ref 36.0–46.0)
Lymphocytes Relative: 30 % (ref 12–46)
Lymphs Abs: 2.9 10*3/uL (ref 0.7–4.0)
MCV: 77.9 fL — ABNORMAL LOW (ref 78.0–100.0)
Monocytes Absolute: 0.5 10*3/uL (ref 0.1–1.0)
RBC: 5.2 MIL/uL — ABNORMAL HIGH (ref 3.87–5.11)
WBC: 9.5 10*3/uL (ref 4.0–10.5)

## 2013-04-22 LAB — URINALYSIS, ROUTINE W REFLEX MICROSCOPIC
Bilirubin Urine: NEGATIVE
Nitrite: NEGATIVE
Specific Gravity, Urine: >1.03 — ABNORMAL HIGH (ref 1.005–1.030)
pH: 5.5 (ref 5.0–8.0)

## 2013-04-22 LAB — RAPID URINE DRUG SCREEN, HOSP PERFORMED
Barbiturates: NOT DETECTED
Cocaine: NOT DETECTED

## 2013-04-22 MED ORDER — LAMOTRIGINE 25 MG PO TABS
50.0000 mg | ORAL_TABLET | Freq: Every day | ORAL | Status: DC
  Administered 2013-04-22: 50 mg via ORAL
  Filled 2013-04-22 (×2): qty 2

## 2013-04-22 MED ORDER — NICOTINE 21 MG/24HR TD PT24
21.0000 mg | MEDICATED_PATCH | Freq: Every day | TRANSDERMAL | Status: DC
  Filled 2013-04-22: qty 1

## 2013-04-22 MED ORDER — VENLAFAXINE HCL ER 150 MG PO CP24
150.0000 mg | ORAL_CAPSULE | Freq: Every morning | ORAL | Status: DC
  Filled 2013-04-22 (×2): qty 1

## 2013-04-22 MED ORDER — MEDROXYPROGESTERONE ACETATE 2.5 MG PO TABS
2.5000 mg | ORAL_TABLET | Freq: Every morning | ORAL | Status: DC
  Filled 2013-04-22 (×2): qty 1

## 2013-04-22 MED ORDER — ESTRADIOL 2 MG PO TABS
2.0000 mg | ORAL_TABLET | Freq: Every morning | ORAL | Status: DC
  Filled 2013-04-22 (×2): qty 1

## 2013-04-22 MED ORDER — ALUM & MAG HYDROXIDE-SIMETH 200-200-20 MG/5ML PO SUSP: 30.0000 mL | ORAL | Status: DC | PRN

## 2013-04-22 MED ORDER — SULFAMETHOXAZOLE-TMP DS 800-160 MG PO TABS
1.0000 | ORAL_TABLET | Freq: Two times a day (BID) | ORAL | Status: DC
  Administered 2013-04-22: 1 via ORAL
  Filled 2013-04-22: qty 1

## 2013-04-22 MED ORDER — METFORMIN HCL 500 MG PO TABS
500.0000 mg | ORAL_TABLET | Freq: Two times a day (BID) | ORAL | Status: DC
  Administered 2013-04-22: 500 mg via ORAL
  Filled 2013-04-22: qty 1

## 2013-04-22 MED ORDER — ONDANSETRON 4 MG PO TBDP
4.0000 mg | ORAL_TABLET | Freq: Three times a day (TID) | ORAL | Status: DC | PRN
  Administered 2013-04-22: 4 mg via ORAL
  Filled 2013-04-22: qty 1

## 2013-04-22 MED ORDER — ZOLPIDEM TARTRATE 5 MG PO TABS: 10.0000 mg | ORAL_TABLET | Freq: Every evening | ORAL | Status: DC | PRN

## 2013-04-22 MED ORDER — LAMOTRIGINE 25 MG PO TABS
ORAL_TABLET | ORAL | Status: AC
  Filled 2013-04-22: qty 2

## 2013-04-22 MED ORDER — IBUPROFEN 400 MG PO TABS: 600.0000 mg | ORAL_TABLET | Freq: Three times a day (TID) | ORAL | Status: DC | PRN

## 2013-04-22 MED ORDER — CLONAZEPAM 0.5 MG PO TABS
0.5000 mg | ORAL_TABLET | Freq: Two times a day (BID) | ORAL | Status: DC | PRN
  Administered 2013-04-23: 0.5 mg via ORAL
  Filled 2013-04-22: qty 1

## 2013-04-22 MED ORDER — ARIPIPRAZOLE 2 MG PO TABS
2.0000 mg | ORAL_TABLET | Freq: Every day | ORAL | Status: DC
  Administered 2013-04-22: 2 mg via ORAL
  Filled 2013-04-22 (×2): qty 1

## 2013-04-22 MED ORDER — ONDANSETRON HCL 4 MG PO TABS: 4.0000 mg | ORAL_TABLET | Freq: Three times a day (TID) | ORAL | Status: DC | PRN

## 2013-04-22 MED ORDER — METOPROLOL TARTRATE 25 MG PO TABS
25.0000 mg | ORAL_TABLET | Freq: Two times a day (BID) | ORAL | Status: DC
  Administered 2013-04-22: 25 mg via ORAL
  Filled 2013-04-22: qty 1

## 2013-04-22 MED ORDER — ZOLPIDEM TARTRATE 5 MG PO TABS: 5.0000 mg | ORAL_TABLET | Freq: Every evening | ORAL | Status: DC | PRN

## 2013-04-22 MED ORDER — ARIPIPRAZOLE 2 MG PO TABS
ORAL_TABLET | ORAL | Status: AC
  Filled 2013-04-22: qty 1

## 2013-04-22 MED ORDER — LISINOPRIL 10 MG PO TABS
20.0000 mg | ORAL_TABLET | Freq: Every day | ORAL | Status: DC
  Administered 2013-04-22: 20 mg via ORAL
  Filled 2013-04-22: qty 2
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
## 2013-04-22 NOTE — ED Notes (Signed)
Hx of anxiety and panic attacks, worse today.  Advised by her psychiatrist to come here for evaluation.

## 2013-04-22 NOTE — ED Provider Notes (Signed)
History  This chart was scribed for Ward Givens, MD by Marlyne Beards, ED Scribe. The patient was seen in room APA16A/APA16A. Patient's care was started at 4:52 PM.    CSN: [id number]  Arrival date & time 04/22/13  1604   First MD Initiated Contact with Patient 04/22/13 1606      Chief Complaint  Patient presents with  . V70.1    (Consider location/radiation/quality/duration/timing/severity/associated sxs/prior treatment) The history is provided by the patient. No language interpreter was used.   [name] is a 47 y.o. female with h/o HTN, DM, anxiety, and depression who presents to the Emergency Department complaining of anxiety, depression, and panic attacks which have become worse today. Pt states that the depression started over the weekend which was 5 days ago. 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  Her agoraphobia restar ted about 3 weeks ago when she couldn't take her daughter to the [place].  She states that her anxiety gives her chest pain with associated nausea. Pt states that her and her boyfriend were trying to go to [place] but she couldn't go, b/o her agoraphobia which upset her. She states she never goes anywhere and it really affects her mentally. Pt denies HI's or hallucinations but does have SI's without a plan today. Pt states that public places exacerbates her anxiety and depression. She states that her prescribed Klonopin does not seem to help as much as used to, but also states she used to be on "4 or 5 mg at a time".  Pt states she was admitted at [facility] psychiatric ward in January for a couple days due to suicidal thoughts. She's tried to kill herself by overdosing in the past about 6 years ago. Pt saw Dr Dan Humphreys, her psychiatrist yesterday and he prescribed wellbutrin which she hasn't filled yet. She states he wanted to commit her yesterday but she didn't have child care for her daughter.    Pt lives with her daughter who is 70 years old.  Pt's sister takes care of her daughter when she is admitted to a hospital.   Dr. Dan Humphreys is the pt's psychiatrist.   Pt's current PCP is Dr. Margo Aye.  Past Medical History  Diagnosis Date  . Hypertension   . Diabetes mellitus   . Arthritis   . Depression   . Anxiety   . Headache   . History of borderline personality disorder   . Personality disorder     Past Surgical History  Procedure Laterality Date  . Foot surgery    . Total abdominal hysterectomy w/ bilateral salpingoophorectomy      Family History  Problem Relation Age of Onset  . Depression Mother   . OCD Other   . ADD / ADHD Neg Hx   . Alcohol abuse Neg Hx   . Drug abuse Neg Hx   . Anxiety disorder Neg Hx   . Bipolar disorder Neg Hx   . Dementia Neg Hx   . Paranoid behavior Neg Hx   . Schizophrenia Neg Hx   . Seizures Neg Hx   . Sexual abuse Neg Hx   . Physical abuse Neg Hx   . Ovarian cancer Sister     History  Substance Use Topics  . Smoking status: Never Smoker   . Smokeless tobacco: Never Used  . Alcohol Use: No  lives with 64 yo daughter On disability for depression and anxiety  OB History   Grav Para Term Preterm Abortions TAB SAB  Ect Mult Living                  Review of Systems  Cardiovascular: Positive for chest pain.  Gastrointestinal: Positive for nausea.  Psychiatric/Behavioral: Positive for suicidal ideas. [name].   All other systems reviewed and are negative.    Allergies  Neurontin; Lamictal; Trazodone and nefazodone; and Tylenol 8 hour  Home Medications   Current Outpatient Rx  Name  Route  Sig  Dispense  Refill  . ARIPiprazole (ABILIFY) 2 MG tablet   Oral   Take 1 tablet (2 mg total) by mouth daily. For mood control   30 tablet   1   . aspirin-acetaminophen-caffeine (EXCEDRIN MIGRAINE) 250-250-65 MG per tablet   Oral   Take 2 tablets by mouth every 6 (six) hours as needed. For migraine headaches.   30 tablet      . benzonatate (TESSALON) 100  MG capsule   Oral   Take 1 capsule (100 mg total) by mouth 3 (three) times daily as needed. For cough.   20 capsule      . buPROPion (WELLBUTRIN) 75 MG tablet      Take by mouth one a day for 1st day, two a day for the 2nd and 3rd day, then 3 a day thereafter   90 tablet   2   . clonazePAM (KLONOPIN) 0.5 MG tablet      Take by mouth 2 a day and on the rare occasion 3 a day (TURN SCRIPT FROM HOSPITAL IN AT OFFICE)   75 tablet   1   . estradiol (ESTRACE) 2 MG tablet   Oral   Take 1 tablet (2 mg total) by mouth daily. For estrogen hormone replacement   30 tablet   0   . furosemide (LASIX) 20 MG tablet   Oral   Take 1 tablet (20 mg total) by mouth daily. For lower extremity swellings.   30 tablet   0   . lamoTRIgine (LAMICTAL) 25 MG tablet   Oral   Take 2 tablets (50 mg total) by mouth daily. For mood stabilization, any higher dose causes panic, migraines, and nausea   60 tablet   1   . lisinopril (PRINIVIL,ZESTRIL) 20 MG tablet   Oral   Take 1 tablet (20 mg total) by mouth daily. For control of high blood pressure         . medroxyPROGESTERone (PROVERA) 2.5 MG tablet   Oral   Take 1 tablet (2.5 mg total) by mouth daily. For progesterone replacement   30 tablet   0   . medroxyPROGESTERone (PROVERA) 5 MG tablet      TAKE 1/2 TABLET BY MOUTH ONCE A DAY   15 tablet   3   . metFORMIN (GLUCOPHAGE) 500 MG tablet   Oral   Take 1 tablet (500 mg total) by mouth 2 (two) times daily with a meal. For diabetes control         . metoprolol tartrate (LOPRESSOR) 25 MG tablet   Oral   Take 1 tablet (25 mg total) by mouth 2 (two) times daily. For hypertension   60 tablet   0   . venlafaxine XR (EFFEXOR-XR) 150 MG 24 hr capsule      take 1 capsule once daily for depression   30 capsule   1     BP 140/63  Pulse 99  Resp 18  SpO2 96%  Vital signs normal    Physical Exam  Nursing note and vitals reviewed. Constitutional: She is oriented to person, place, and  time. She appears well-developed and well-nourished.  Non-toxic appearance. She does not appear ill. No distress.  HENT:  Head: Normocephalic and atraumatic.  Right Ear: External ear normal.  Left Ear: External ear normal.  Nose: Nose normal. No mucosal edema or rhinorrhea.  Mouth/Throat: Oropharynx is clear and moist and mucous membranes are normal. No dental abscesses or edematous.  Eyes: Conjunctivae and EOM are normal. Pupils are equal, round, and reactive to light.  Neck: Normal range of motion and full passive range of motion without pain. Neck supple.  Cardiovascular: Normal rate, regular rhythm and normal heart sounds.  Exam reveals no gallop and no friction rub.   No murmur heard. Pulmonary/Chest: Effort normal and breath sounds normal. No respiratory distress. She has no wheezes. She has no rhonchi. She has no rales. She exhibits no tenderness and no crepitus.  Abdominal: Soft. Normal appearance and bowel sounds are normal. She exhibits no distension. There is no tenderness. There is no rebound and no guarding.  Musculoskeletal: Normal range of motion. She exhibits no edema and no tenderness.  Moves all extremities well.   Neurological: She is alert and oriented to person, place, and time. She has normal strength. No cranial nerve deficit.  Skin: Skin is warm, dry and intact. No rash noted. No erythema. No pallor.  Psychiatric: Her speech is normal and behavior is normal. Her mood appears not anxious.  Flat affect.    ED Course  Procedures (including critical care time)  Medications  sulfamethoxazole-trimethoprim (BACTRIM DS) 800-160 MG per tablet 1 tablet (not administered)    DIAGNOSTIC STUDIES:     COORDINATION OF CARE: 5:04 PM Discussed ED treatment with pt and pt agrees.    1757 Ella, ACT is coming to see pt.  19:00 psych holding orders done  23:50 Samson Frederic, ACT has submitted her paperwork to [facility].   Results for orders placed during the hospital encounter of  04/22/13  CBC WITH DIFFERENTIAL      Result Value Range   WBC 9.5  4.0 - 10.5 K/uL   RBC 5.20 (*) 3.87 - 5.11 MIL/uL   Hemoglobin 13.7  12.0 - 15.0 g/dL   HCT 40.9  81.1 - 91.4 %   MCV 77.9 (*) 78.0 - 100.0 fL   MCH 26.3  26.0 - 34.0 pg   MCHC 33.8  30.0 - 36.0 g/dL   RDW 78.2  95.6 - 21.3 %   Platelets 207  150 - 400 K/uL   Neutrophils Relative 62  43 - 77 %   Neutro Abs 5.9  1.7 - 7.7 K/uL   Lymphocytes Relative 30  12 - 46 %   Lymphs Abs 2.9  0.7 - 4.0 K/uL   Monocytes Relative 5  3 - 12 %   Monocytes Absolute 0.5  0.1 - 1.0 K/uL   Eosinophils Relative 2  0 - 5 %   Eosinophils Absolute 0.2  0.0 - 0.7 K/uL   Basophils Relative 0  0 - 1 %   Basophils Absolute 0.0  0.0 - 0.1 K/uL  COMPREHENSIVE METABOLIC PANEL      Result Value Range   Sodium 140  135 - 145 mEq/L   Potassium 3.8  3.5 - 5.1 mEq/L   Chloride 100  96 - 112 mEq/L   CO2 26  19 - 32 mEq/L   Glucose, Bld 93  70 - 99 mg/dL   BUN 13  6 -  23 mg/dL   Creatinine, Ser 8.29  0.50 - 1.10 mg/dL   Calcium 9.8  8.4 - 56.2 mg/dL   Total Protein 7.2  6.0 - 8.3 g/dL   Albumin 3.7  3.5 - 5.2 g/dL   AST 44 (*) 0 - 37 U/L   ALT 29  0 - 35 U/L   Alkaline Phosphatase 103  39 - 117 U/L   Total Bilirubin 0.2 (*) 0.3 - 1.2 mg/dL   GFR calc non Af Amer >90  >90 mL/min   GFR calc Af Amer >90  >90 mL/min  ETHANOL      Result Value Range   Alcohol, Ethyl (B) <11  0 - 11 mg/dL  URINE RAPID DRUG SCREEN (HOSP PERFORMED)      Result Value Range   Opiates NONE DETECTED  NONE DETECTED   Cocaine NONE DETECTED  NONE DETECTED   Benzodiazepines POSITIVE (*) NONE DETECTED   Amphetamines NONE DETECTED  NONE DETECTED   Tetrahydrocannabinol NONE DETECTED  NONE DETECTED   Barbiturates NONE DETECTED  NONE DETECTED  URINALYSIS, ROUTINE W REFLEX MICROSCOPIC      Result Value Range   Color, Urine YELLOW  YELLOW   APPearance HAZY (*) CLEAR   Specific Gravity, Urine >1.030 (*) 1.005 - 1.030   pH 5.5  5.0 - 8.0   Glucose, UA NEGATIVE  NEGATIVE  mg/dL   Hgb urine dipstick NEGATIVE  NEGATIVE   Bilirubin Urine NEGATIVE  NEGATIVE   Ketones, ur NEGATIVE  NEGATIVE mg/dL   Protein, ur NEGATIVE  NEGATIVE mg/dL   Urobilinogen, UA 0.2  0.0 - 1.0 mg/dL   Nitrite NEGATIVE  NEGATIVE   Leukocytes, UA SMALL (*) NEGATIVE  URINE MICROSCOPIC-ADD ON      Result Value Range   Squamous Epithelial / LPF FEW (*) RARE   WBC, UA 7-10  <3 WBC/hpf   Bacteria, UA FEW (*) RARE   Laboratory interpretation all normal except concentrated urine c/w dehydration, poss uti     1. Depression   2. Anxiety   3. Panic attacks   4. Agoraphobia   5. Suicidal ideation   6. UTI (lower urinary tract infection)     Plan inpatient psychiatric admission.   Devoria Albe, MD, FACEP     MDM  I personally performed the services described in this documentation, which was scribed in my presence. The recorded information has been reviewed and considered.  Devoria Albe, MD, Armando Gang         Ward Givens, MD 04/23/13 [phone]

## 2013-04-22 NOTE — Telephone Encounter (Signed)
Had left message with receptionist to have pt go to [facility] ED.  Later called home number and got no answer.

## 2013-04-23 ENCOUNTER — Encounter (HOSPITAL_COMMUNITY): Payer: Self-pay

## 2013-04-23 ENCOUNTER — Inpatient Hospital Stay (HOSPITAL_COMMUNITY)
Admission: EM | Admit: 2013-04-23 | Discharge: 2013-04-26 | DRG: 885 | Disposition: A | Discharge status: Home / Self Care | Payer: Medicare Other | Source: Intra-hospital | Attending: Psychiatry | Admitting: Psychiatry

## 2013-04-23 DIAGNOSIS — E119 Type 2 diabetes mellitus without complications: Secondary | ICD-10-CM | POA: Diagnosis present

## 2013-04-23 DIAGNOSIS — R45851 Suicidal ideations: Secondary | ICD-10-CM

## 2013-04-23 DIAGNOSIS — F603 Borderline personality disorder: Secondary | ICD-10-CM

## 2013-04-23 DIAGNOSIS — F410 Panic disorder [episodic paroxysmal anxiety] without agoraphobia: Secondary | ICD-10-CM | POA: Diagnosis present

## 2013-04-23 DIAGNOSIS — F339 Major depressive disorder, recurrent, unspecified: Secondary | ICD-10-CM | POA: Diagnosis present

## 2013-04-23 DIAGNOSIS — F1320 Sedative, hypnotic or anxiolytic dependence, uncomplicated: Secondary | ICD-10-CM

## 2013-04-23 DIAGNOSIS — Z79899 Other long term (current) drug therapy: Secondary | ICD-10-CM

## 2013-04-23 DIAGNOSIS — I10 Essential (primary) hypertension: Secondary | ICD-10-CM | POA: Diagnosis present

## 2013-04-23 DIAGNOSIS — F1994 Other psychoactive substance use, unspecified with psychoactive substance-induced mood disorder: Secondary | ICD-10-CM | POA: Diagnosis present

## 2013-04-23 DIAGNOSIS — F429 Obsessive-compulsive disorder, unspecified: Secondary | ICD-10-CM | POA: Diagnosis present

## 2013-04-23 DIAGNOSIS — F332 Major depressive disorder, recurrent severe without psychotic features: Principal | ICD-10-CM | POA: Diagnosis present

## 2013-04-23 DIAGNOSIS — F0781 Postconcussional syndrome: Secondary | ICD-10-CM

## 2013-04-23 DIAGNOSIS — T7422XA Child sexual abuse, confirmed, initial encounter: Secondary | ICD-10-CM

## 2013-04-23 DIAGNOSIS — F4310 Post-traumatic stress disorder, unspecified: Secondary | ICD-10-CM | POA: Diagnosis present

## 2013-04-23 MED ORDER — CLONAZEPAM 0.5 MG PO TABS
0.5000 mg | ORAL_TABLET | Freq: Three times a day (TID) | ORAL | Status: DC | PRN
  Administered 2013-04-23 – 2013-04-26 (×9): 0.5 mg via ORAL
  Filled 2013-04-23 (×9): qty 1

## 2013-04-23 MED ORDER — ESTRADIOL 2 MG PO TABS
2.0000 mg | ORAL_TABLET | Freq: Every day | ORAL | Status: DC
  Administered 2013-04-23 – 2013-04-26 (×4): 2 mg via ORAL
  Filled 2013-04-23 (×5): qty 1

## 2013-04-23 MED ORDER — MEDROXYPROGESTERONE ACETATE 2.5 MG PO TABS
2.5000 mg | ORAL_TABLET | Freq: Every day | ORAL | Status: DC
  Administered 2013-04-23 – 2013-04-26 (×4): 2.5 mg via ORAL
  Filled 2013-04-23 (×5): qty 1

## 2013-04-23 MED ORDER — LAMOTRIGINE 25 MG PO TABS
50.0000 mg | ORAL_TABLET | Freq: Every day | ORAL | Status: DC
  Administered 2013-04-23 – 2013-04-26 (×4): 50 mg via ORAL
  Filled 2013-04-23 (×6): qty 2

## 2013-04-23 MED ORDER — FUROSEMIDE 20 MG PO TABS
20.0000 mg | ORAL_TABLET | Freq: Every day | ORAL | Status: DC
  Administered 2013-04-23 – 2013-04-26 (×4): 20 mg via ORAL
  Filled 2013-04-23 (×5): qty 1

## 2013-04-23 MED ORDER — METOPROLOL TARTRATE 25 MG PO TABS
25.0000 mg | ORAL_TABLET | Freq: Two times a day (BID) | ORAL | Status: DC
  Administered 2013-04-23 – 2013-04-26 (×7): 25 mg via ORAL
  Filled 2013-04-23 (×10): qty 1

## 2013-04-23 MED ORDER — MEDROXYPROGESTERONE ACETATE 2.5 MG PO TABS
2.5000 mg | ORAL_TABLET | Freq: Every morning | ORAL | Status: DC
  Filled 2013-04-23 (×2): qty 1

## 2013-04-23 MED ORDER — HYDROXYZINE HCL 50 MG PO TABS: 50.0000 mg | ORAL_TABLET | Freq: Every evening | ORAL | Status: DC | PRN

## 2013-04-23 MED ORDER — BUPROPION HCL 75 MG PO TABS
75.0000 mg | ORAL_TABLET | Freq: Every day | ORAL | Status: DC
  Administered 2013-04-23 – 2013-04-26 (×4): 75 mg via ORAL
  Filled 2013-04-23 (×6): qty 1

## 2013-04-23 MED ORDER — ALUM & MAG HYDROXIDE-SIMETH 200-200-20 MG/5ML PO SUSP: 30.0000 mL | ORAL | Status: DC | PRN

## 2013-04-23 MED ORDER — ARIPIPRAZOLE 2 MG PO TABS
2.0000 mg | ORAL_TABLET | Freq: Every day | ORAL | Status: DC
  Administered 2013-04-23 – 2013-04-25 (×3): 2 mg via ORAL
  Filled 2013-04-23 (×4): qty 1

## 2013-04-23 MED ORDER — MAGNESIUM HYDROXIDE 400 MG/5ML PO SUSP: 30.0000 mL | Freq: Every day | ORAL | Status: DC | PRN

## 2013-04-23 MED ORDER — LISINOPRIL 20 MG PO TABS
20.0000 mg | ORAL_TABLET | Freq: Every day | ORAL | Status: DC
  Administered 2013-04-23 – 2013-04-26 (×4): 20 mg via ORAL
  Filled 2013-04-23 (×6): qty 1

## 2013-04-23 MED ORDER — VENLAFAXINE HCL ER 150 MG PO CP24
150.0000 mg | ORAL_CAPSULE | Freq: Every morning | ORAL | Status: DC
  Filled 2013-04-23 (×3): qty 1

## 2013-04-23 MED ORDER — ESTRADIOL 2 MG PO TABS
2.0000 mg | ORAL_TABLET | Freq: Every morning | ORAL | Status: DC
  Filled 2013-04-23 (×2): qty 1

## 2013-04-23 MED ORDER — METFORMIN HCL 500 MG PO TABS
500.0000 mg | ORAL_TABLET | Freq: Two times a day (BID) | ORAL | Status: DC
  Administered 2013-04-23 – 2013-04-26 (×7): 500 mg via ORAL
  Filled 2013-04-23 (×10): qty 1

## 2013-04-23 MED ORDER — FUROSEMIDE 20 MG PO TABS
20.0000 mg | ORAL_TABLET | Freq: Every morning | ORAL | Status: DC
  Filled 2013-04-23 (×2): qty 1

## 2013-04-23 MED ORDER — VENLAFAXINE HCL ER 150 MG PO CP24
150.0000 mg | ORAL_CAPSULE | Freq: Every day | ORAL | Status: DC
  Administered 2013-04-23 – 2013-04-26 (×4): 150 mg via ORAL
  Filled 2013-04-23 (×5): qty 1

## 2013-04-23 NOTE — Progress Notes (Signed)
D: Patient denies SI/HI and auditory and visual hallucinations. The patient has an anxious mood and affect. The patient reports being anxious about her "medications being correct." The patient rates her depression a 9 out of 10 and her hopelessness a 10 out of 10 (1 low/10 high). The patient reports sleeping poorly and states that her appetite is good but that her energy level is low. The patient is attending groups on the unit. The patient is medication and attention seeking on unit. The patient consistently and repeatedly comes to staff with medication concerns.  A: Patient given emotional support from RN. Patient encouraged to come to staff with concerns and/or questions. Patient's medication routine continued. Patient's orders and plan of care reviewed. Patient referred to MD about medication questions.  R: Patient remains cooperative. Will continue to monitor patient q15 minutes for safety.

## 2013-04-23 NOTE — [facility] Group Notes (Signed)
[facility] LCSW Group Therapy         Feelings Around Relapse        1:15-2:30 PM    04/23/2013 12:46 PM  Type of Therapy:  Group Therapy  Participation Level:  Did Not Attend  Wynn Banker 04/23/2013, 12:46 PM

## 2013-04-23 NOTE — Progress Notes (Signed)
Patient has ben accepted at [facility] by Vanessa Ralphs NP to the services of Dr. Daleen Bo; room 504.1
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
## 2013-04-23 NOTE — Progress Notes (Signed)
Adult Psychoeducational Group Note  Date:  04/23/2013 Time:  11:57 AM  Group Topic/Focus:  Early Warning Signs:   The focus of this group is to help patients identify signs or symptoms they exhibit before slipping into an unhealthy state or crisis.  Participation Level:  Did Not Attend   Berton Mount T 04/23/2013, 11:57 AM

## 2013-04-23 NOTE — Tx Team (Signed)
Initial Interdisciplinary Treatment Plan  PATIENT STRENGTHS: (choose at least two) Ability for insight Average or above average intelligence Capable of independent living General fund of knowledge Motivation for treatment/growth Supportive family/friends  PATIENT STRESSORS: Financial difficulties Health problems Medication change or noncompliance   PROBLEM LIST: Problem List/Patient Goals Date to be addressed Date deferred Reason deferred Estimated date of resolution  depression 04/23/2013     anxiety 04/23/2013     panic disorder 04/23/2013                                          DISCHARGE CRITERIA:  Ability to meet basic life and health needs Improved stabilization in mood, thinking, and/or behavior Medical problems require only outpatient monitoring Motivation to continue treatment in a less acute level of care Verbal commitment to aftercare and medication compliance  PRELIMINARY DISCHARGE PLAN: Attend aftercare/continuing care group Attend PHP/IOP Return to previous living arrangement  PATIENT/FAMIILY INVOLVEMENT: This treatment plan has been presented to and reviewed with the patient, [name], and/or family member, The patient and family have been given the opportunity to ask questions and make suggestions.  JEHU-APPIAH, Sharanya Templin K 04/23/2013, 4:17 AM

## 2013-04-23 NOTE — Tx Team (Signed)
Interdisciplinary Treatment Plan Update   Date Reviewed:  04/23/2013  Time Reviewed:  10:50 AM  Progress in Treatment:   Attending groups: Yes Participating in groups: Yes Taking medication as prescribed: Yes  Tolerating medication: Yes Family/Significant other contact made: No but will asked for consent for collateral contact  Patient understands diagnosis: Yes  Discussing patient identified problems/goals with staff: Yes Medical problems stabilized or resolved: Yes Denies suicidal/homicidal ideation: Yes Patient has not harmed self or others: Yes  For review of initial/current patient goals, please see plan of care.  Estimated Length of Stay:  2-4 days  Reasons for Continued Hospitalization:  Anxiety Depression Medication stabilization   New Problems/Goals identified:    Discharge Plan or Barriers:   Home with outpatient follow up [facility] [place]  Additional Comments:   [name] is an 47 y.o. female. Pt psychiatrist Dr Orson Aloe called [facility] and reported pt is decompensating and needs admission. Pt reports she has become more and more anxious with increased panic attacks causing her depression to worsen. She had been on xanax for a long period of time and was taken off several months ago and has had panic attack several times a day. She reports Dr Dan Humphreys started her back on Clonopin .5mg  bid and tid prn. Pt reports nothing is giving her relief and her depression has worsen.   Attendees:  Patient: [name] 04/23/2013 10:50 AM   Signature: [name] North, MD 04/23/2013 10:50 AM  Signature:Tina Arlana Pouch, RN 04/23/2013 10:50 AM  Signature: Nestor Ramp, RN 04/23/2013 10:50 AM  Signature: 04/23/2013 10:50 AM  Signature:   04/23/2013 10:50 AM  Signature:  Juline Patch, LCSW 04/23/2013 10:50 AM  Signature:  04/23/2013 10:50 AM  Signature:  04/23/2013 10:50 AM  Signature: Fransisca Kaufmann, [facility] 04/23/2013 10:50 AM  Signature:    Signature:    Signature:       Scribe for Treatment Team:   Juline Patch,  04/23/2013 10:50 AM

## 2013-04-23 NOTE — ED Provider Notes (Signed)
0100 Adised by nursing that patient has been accepted at [facility] by Dr. Daleen Bo.  Nicoletta Dress. Colon Branch, MD 04/23/13 1610

## 2013-04-23 NOTE — Progress Notes (Signed)
Dr Colon Branch made aware of pt being accepted at [facility].  See support paperwork.

## 2013-04-23 NOTE — Progress Notes (Signed)
Patient ID: [name], female   DOB: 09/11/1966, 47 y.o.   MRN: [id number] Admission note: D:Patient is a  Voluntary admission in no acute distress for increased depression, anxiety and panic attacks. Pt reports increased depression for the past month. Pt stated she sees Dr. Dan Humphreys outpatient and he reccommended she come for inpatient treatment for medication modification. Pt denies SI/HI/AVH and pain. Pt is cooperative with assessment  A: Pt admitted to unit per protocol, skin assessment and belonging search done.  Pt educated on therapeutic milieu rules. Pt was introduced to milieu by nursing staff. Fall risk safety plan explained to the patient. 15 minutes checks started for safety.   R: Pt was receptive to education. Writer offered support.

## 2013-04-23 NOTE — [facility] Group Notes (Signed)
[facility] LCSW Aftercare Discharge Planning Group Note   04/23/2013 10:53 AM  Participation Quality:  Appropriate  Mood/Affect:  Depressed and Flat  Depression Rating:  9  Anxiety Rating:  10  Thoughts of Suicide:  No  Will you contract for safety?   NA  Current AVH:  No  Plan for Discharge/Comments:  Patient reports admitting to hospital with SI, depression and anxiety.  She is not endorsing SI at this time.  Patient is unable to identify any specific stressors but acknowledges she recently moved to the ground floor of her apartment building and not sleeping well due to hearing sounds from other apartments and outside noises.  Transportation Means: Patient has transportation.  Supports:  Patient reports mother is supportive.  Amyre Segundo, Joesph July

## 2013-04-23 NOTE — Progress Notes (Signed)
Patient ID: [name], female   DOB: 03-11-1966, 47 y.o.   MRN: [id number] D: Patient mood/affect is anxious.  Pt stated her son was going to his HS prom and was sad not to be there to see him. Pt denies SI/HI/AVH and pain. Pt attended evening wrap up group and Interacted appropriately with peers. Pt denies any needs or concerns.  Cooperative with assessment. No acute distressed noted at this time.   A: Met with pt 1:1. Medications administered as prescribed. Writer encouraged pt to discuss feelings. Pt encouraged to come to staff with any questions or concerns. 15 minutes checks for safety.  R: Patient remains safe. She is complaint with medications and denies any adverse reaction. Continue current POC.

## 2013-04-23 NOTE — Progress Notes (Signed)
[facility] Group Notes:  (Nursing/MHT/Case Management/Adjunct)  Date:  04/23/2013  Time:  11:44 AM  Type of Therapy:  Therapeutic Activity  Participation Level:  Did Not Attend  Dalia Heading 04/23/2013, 11:44 AM
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
## 2013-04-23 NOTE — Progress Notes (Signed)
[facility] Group Notes:  (Nursing/MHT/Case Management/Adjunct)  Date:  04/23/2013  Time:  2000  Type of Therapy:  Psychoeducational Skills  Participation Level:  Minimal  Participation Quality:  Resistant  Affect:  Depressed  Cognitive:  Appropriate  Insight:  Lacking  Engagement in Group:  Lacking  Modes of Intervention:  Education  Summary of Progress/Problems: The patient described her day as having been "tiresome". No further details were provided about her day. Her goal for tomorrow is to "go to all groups".   Hazle Coca S 04/23/2013, 10:07 PM
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
## 2013-04-24 ENCOUNTER — Encounter (HOSPITAL_COMMUNITY): Payer: Self-pay | Admitting: Registered Nurse

## 2013-04-24 DIAGNOSIS — R45851 Suicidal ideations: Secondary | ICD-10-CM

## 2013-04-24 DIAGNOSIS — F329 Major depressive disorder, single episode, unspecified: Secondary | ICD-10-CM

## 2013-04-24 LAB — GLUCOSE, CAPILLARY
Glucose-Capillary: 128 mg/dL — ABNORMAL HIGH (ref 70–99)
Glucose-Capillary: 120 mg/dL — ABNORMAL HIGH (ref 70–99)
Glucose-Capillary: 107 mg/dL — ABNORMAL HIGH (ref 70–99)

## 2013-04-24 NOTE — Progress Notes (Signed)
[facility] Group Notes:  (Nursing/MHT/Case Management/Adjunct)  Date:  04/24/2013  Time:  2000  Type of Therapy:  Psychoeducational Skills  Participation Level:  Minimal  Participation Quality:  Resistant  Affect:  Depressed  Cognitive:  Appropriate  Insight:  Lacking  Engagement in Group:  Resistant  Modes of Intervention:  Education  Summary of Progress/Problems: The patient shared with the group that she had an "okay day". She attributed her good day to being able to talk more with her peers as well as for going outside for fresh air. Her goal for tomorrow is to work on her anxiety.   Hazle Coca S 04/24/2013, 9:57 PM

## 2013-04-24 NOTE — Progress Notes (Signed)
D.  Pt pleasant on approach, denies complaints at this time.  Did not want to take sleep medication, states she will take it if she feels she really needs it.  Denies SI/HI/hallucinations at this time.  Positive for evening group, interacting appropriately within milieu. A. Support and encouragement offered  R.  Will continue to monitor.

## 2013-04-24 NOTE — Progress Notes (Signed)
Psychoeducational Group Note  Date:  04/24/2013 Time:  1030  Group Topic/Focus:  Identifying Needs:   The focus of this group is to help patients identify their personal needs that have been historically problematic and identify healthy behaviors to address their needs.  Participation Level:  active Participation Quality: good Affect: flat Cognitive:    Insight:  good  Engagement in Group: engaged  Additional Comments:   PDuke RN BC   

## 2013-04-24 NOTE — Progress Notes (Addendum)
[facility] MD Progress Note  04/24/2013 9:51 AM [name]   MRN:  [id number]  Subjective:  "I've been missing out on some stuff.  I believe that my Agoraphobia is coming back.  I haven't been able to go to my daughter soccer practice.  I wish I had some kinda stuff on relaxation.  I have some stuff on deep breathing but when I am having a panic attack it is really hard for me to do.  I really don't have triggers well my triggers are getting out.  My daughter had a trip planned with the girl scouts and I had planned to go but I couldn't.  I feel like it is getting worse and worse.  I have had this all my life and I just feel like it is getting worse.  The medicine helped me in the past the xanax but I feel like I am on so little of dose now.  I took one before coming to [place] and when I got here in [place] it just became a full blown attack.  I can't be like this for my daughter.  I feel like I am going to miss out on everything.  They want me to do this with out medicine just using the coping skills and it is just not working.  I just have a lot of fears; fear of everything and that is why I stay so anxious all the time.    Diagnosis:  Axis I: Anxiety Disorder NOS, Post Traumatic Stress Disorder and Major Depressive Disorder, Suicidal Thoughts  ADL's:  Intact  Sleep: Fair  Appetite:  Fair  Suicidal Ideation: Yes Plan:  yes Homicidal Ideation:  No AEB (as evidenced by): Patient is participating  in group sessions and tolerating medication without adverse effects.     Psychiatric Specialty Exam: ROS  Review of Systems  Psychiatric/Behavioral: Positive for depression and suicidal ideas. [name].  All other systems reviewed and are negative.     Blood pressure 143/84, pulse 97, temperature 97.2 F (36.2 C), temperature source Oral, resp. rate 18, height 5.5" (0.14 m), weight 95.255 kg (210 lb).Body mass index is 4,859.95 kg/(m^2).  General Appearance: Neat and  Well Groomed  Patent attorney::  Fair  Speech:  Clear and Coherent and Normal Rate  Volume:  Normal  Mood:  Anxious, Depressed and Hopeless  Affect:  Depressed and Flat  Thought Process:  Circumstantial, Goal Directed and Logical  Orientation:  Full (Time, Place, and Person)  Thought Content:  Paranoid Ideation and Rumination  Suicidal Thoughts:  Yes.  without intent/plan  Homicidal Thoughts:  No  Memory:  Immediate;   Fair Recent;   Fair Remote;   Fair  Judgement:  Impaired  Insight:  Lacking and Present  Psychomotor Activity:  Normal  Concentration:  Fair  Recall:  Fair  Akathisia:  NA  Handed:  Right  AIMS (if indicated):     Assets:  Communication Skills Housing Social Support Transportation  Sleep:  Number of Hours: 6   Current Medications: Current Facility-Administered Medications  Medication Dose Route Frequency Provider Last Rate Last Dose  . alum & mag hydroxide-simeth (MAALOX/MYLANTA) 200-200-20 MG/5ML suspension 30 mL  30 mL Oral Q4H PRN Court Joy, PA-C      . ARIPiprazole (ABILIFY) tablet 2 mg  2 mg Oral QHS Court Joy, PA-C   2 mg at 04/23/13 2120  . buPROPion [facility]) tablet 75 mg  75 mg Oral Daily Court Joy,  PA-C   75 mg at 04/24/13 0802  . clonazePAM (KLONOPIN) tablet 0.5 mg  0.5 mg Oral TID PRN Court Joy, PA-C   0.5 mg at 04/24/13 0847  . estradiol (ESTRACE) tablet 2 mg  2 mg Oral Daily Himabindu Ravi, MD   2 mg at 04/24/13 0803  . furosemide (LASIX) tablet 20 mg  20 mg Oral Daily Himabindu Ravi, MD   20 mg at 04/24/13 0803  . hydrOXYzine (ATARAX/VISTARIL) tablet 50 mg  50 mg Oral QHS PRN,MR X 1 Court Joy, PA-C      . lamoTRIgine (LAMICTAL) tablet 50 mg  50 mg Oral Daily Court Joy, PA-C   50 mg at 04/24/13 0803  . lisinopril (PRINIVIL,ZESTRIL) tablet 20 mg  20 mg Oral Daily Court Joy, PA-C   20 mg at 04/24/13 4540  . magnesium hydroxide (MILK OF MAGNESIA) suspension 30 mL  30 mL Oral Daily PRN Court Joy, PA-C       . medroxyPROGESTERone (PROVERA) tablet 2.5 mg  2.5 mg Oral Daily Himabindu Ravi, MD   2.5 mg at 04/24/13 0802  . metFORMIN (GLUCOPHAGE) tablet 500 mg  500 mg Oral BID WC Court Joy, PA-C   500 mg at 04/24/13 9811  . metoprolol tartrate (LOPRESSOR) tablet 25 mg  25 mg Oral BID Court Joy, PA-C   25 mg at 04/24/13 9147  . venlafaxine XR (EFFEXOR-XR) 24 hr capsule 150 mg  150 mg Oral Q breakfast Himabindu Ravi, MD   150 mg at 04/24/13 8295    Lab Results:  Results for orders placed during the hospital encounter of 04/23/13 (from the past 48 hour(s))  GLUCOSE, CAPILLARY     Status: Abnormal   Collection Time    04/23/13  6:16 AM      Result Value Range   Glucose-Capillary 115 (*) 70 - 99 mg/dL  GLUCOSE, CAPILLARY     Status: Abnormal   Collection Time    04/23/13 11:57 AM      Result Value Range   Glucose-Capillary 109 (*) 70 - 99 mg/dL  GLUCOSE, CAPILLARY     Status: Abnormal   Collection Time    04/23/13  5:12 PM      Result Value Range   Glucose-Capillary 165 (*) 70 - 99 mg/dL  GLUCOSE, CAPILLARY     Status: Abnormal   Collection Time    04/24/13  5:57 AM      Result Value Range   Glucose-Capillary 128 (*) 70 - 99 mg/dL    Physical Findings: AIMS: Facial and Oral Movements Muscles of Facial Expression: None, normal Lips and Perioral Area: None, normal Jaw: None, normal Tongue: None, normal,Extremity Movements Upper (arms, wrists, hands, fingers): None, normal Lower (legs, knees, ankles, toes): None, normal, Trunk Movements Neck, shoulders, hips: None, normal, Overall Severity Severity of abnormal movements (highest score from questions above): None, normal Incapacitation due to abnormal movements: None, normal Patient's awareness of abnormal movements (rate only patient's report): No Awareness, Dental Status Current problems with teeth and/or dentures?: No Does patient usually wear dentures?: No  CIWA:    COWS:     Treatment Plan Summary: Daily contact with  patient to assess and evaluate symptoms and progress in treatment Medication management  Plan: Conitnue Abilify, Lamictal, clonazepam, and effexor.  Medical Decision Making Problem Points:  Established problem, stable/improving (1), Review of last therapy session (1) and Review of psycho-social stressors (1) Data Points:  Independent review of image, tracing, or specimen (  2) Review or order clinical lab tests (1) Review and summation of old records (2) Review of medication regiment & side effects (2)  I certify that inpatient services furnished can reasonably be expected to improve the patient's condition.  Shuvon B. Rankin FNP-BC Family Nurse Practitioner, Board Certified   Rankin, Shuvon 04/24/2013, 9:51 AM     Discussed with provider. Reviewed note. Agree with above findings, assessment and plan.   Jacqulyn Cane, M.D.  04/24/2013 7:50 PM

## 2013-04-24 NOTE — [facility] Group Notes (Signed)
[facility] LCSW Group Therapy  04/24/2013 3:00-4:00PM  Summary of Progress/Problems:  The main focus of today's process group was for the patient to identify a self-sabotaging behavior related to their hospitalization that they would like to change, then to discuss their motivation to change. Many group members identified one thing they have in common is that they "stuff down" their emotions instead of dealing with them, whether it is sadness, guilt, or anger, and after a long period of time that becomes exhausting.  The patient verbalized that she is so physically and emotionally tired, and is dealing with so much anxiety.  She has a lot of guilt feelings as well because she is missing her childrens' activities due to her anxiety.  She has been agoraphobic in the past and feels she is headed that direction again.  She is working with a therapist on techniques, but to date these are ineffective.  Type of Therapy:  Group Therapy  Participation Level:  Active  Participation Quality:  Appropriate, Attentive, Sharing and Supportive  Affect:  Depressed and Flat (described feeling very anxious, but this did not register in affect)  Cognitive:  Appropriate  Insight:  Developing/Improving  Engagement in Therapy:  Engaged  Modes of Intervention:  Discussion and Exploration  Sarina Ser 04/24/2013, 4:25 PM

## 2013-04-24 NOTE — Progress Notes (Signed)
D Leannah is seen out in the milieu. She tolerates  This fairly well. She is flat, has a blunted affect and denies SI within the past 24 hrs, rates her depression and hopelessness " 5 / 9 " and states her DC plan is to " structure my days and practice relaxation techniques " .    A SHe attends her scheduled groups as planned, is attentive to the discussion.    R Safety is in place and POC cont.

## 2013-04-24 NOTE — Progress Notes (Signed)
Patient ID: [name], female   DOB: Jun 04, 1966, 47 y.o.   MRN: [id number] Review of Systems  Psychiatric/Behavioral: Positive for depression and suicidal ideas. [name].   All other systems reviewed and are negative.

## 2013-04-25 DIAGNOSIS — F329 Major depressive disorder, single episode, unspecified: Secondary | ICD-10-CM

## 2013-04-25 DIAGNOSIS — F4310 Post-traumatic stress disorder, unspecified: Secondary | ICD-10-CM

## 2013-04-25 DIAGNOSIS — F411 Generalized anxiety disorder: Secondary | ICD-10-CM

## 2013-04-25 LAB — GLUCOSE, CAPILLARY: Glucose-Capillary: 111 mg/dL — ABNORMAL HIGH (ref 70–99)

## 2013-04-25 LAB — URINE CULTURE: Colony Count: >=100000

## 2013-04-25 NOTE — Progress Notes (Signed)
D.  Pt pleasant and appropriate on approach.  Denies complaints at this time.  Interacting appropriately within milieu.  Positive for evening group, see group notes.  Denies SI/HI/hallucinations at this time.  A.  Support and encouragement offered  R.  Pt remains safe on the unit, will continue to monitor.

## 2013-04-25 NOTE — [facility] Group Notes (Signed)
[facility] Group Notes:  (Clinical Social Work)  04/25/2013   3:00-4:00PM  Summary of Progress/Problems:   The main focus of today's process group was to   identify the patient's current support system and decide on other supports that can be put in place.  Four definitions/levels of support were discussed and an exercise was utilized to show how much stronger we become with additional supports.  An emphasis was placed on using counselor, doctor, therapy groups, 12-step groups, and problem-specific support groups to expand supports, as well as doing something different than has been done before. The patient expressed understanding, but is not sure how to go about finding more supports when her agoraphobia is making it so hard for her to get out of the house to get support.  CSW suggested seeking online support group, and also talked about getting an accountability partner.  Type of Therapy:  Process Group  Participation Level:  Active  Participation Quality:  Appropriate, Attentive and Sharing  Affect:  Flat  Cognitive:  Oriented  Insight:  Developing/Improving  Engagement in Therapy:  Engaged  Modes of Intervention:  Education,  Support and Processing, Exploration, Discussion   Ambrose Mantle, LCSW 04/25/2013, 4:52 PM

## 2013-04-25 NOTE — Progress Notes (Signed)
[facility] MD Progress Note  04/25/2013 1:27 PM [name]  MRN:  [id number] Subjective:  [name] is complaining of some dizziness, and she feels that the Wellbutrin may be the cause. She reports that the dizziness is slightly less today than she experienced yesterday. She continues to express a significant amount of anxiety, and rates her anxiety as an 8 on a scale of 1-10 where 10 is the worst. She also endorses some depression, and rates her depression as a 6 on a scale of 1-10. She reports that she slept well last night. Her appetite has been poor today, especially at lunchtime, and she attributes that to her level of anxiety. She denies any suicidal or homicidal ideation. She denies any auditory or visual hallucinations. She continues to ruminate and perseverate over fears of the future and the unknown.  Diagnosis:   Axis I: Anxiety Disorder NOS, Post Traumatic Stress Disorder and Major Depressive Disorder Axis II: Deferred Axis III:  Past Medical History  Diagnosis Date  . Hypertension   . Diabetes mellitus   . Arthritis   . Depression   . Anxiety   . Headache   . History of borderline personality disorder   . Personality disorder     ADL's:  Intact  Sleep: Good  Appetite:  Fair  Suicidal Ideation:  Patient denies any thought, plan, or intent. Homicidal Ideation:  Patient denies any thought, plan, or intent. AEB (as evidenced by):  Psychiatric Specialty Exam: Review of Systems  Constitutional: Negative.   HENT: Negative.   Eyes: Positive for blurred vision (myopia).  Respiratory: Negative.   Cardiovascular: Negative.   Gastrointestinal: Negative.   Genitourinary: Negative.   Musculoskeletal: Negative.   Skin: Negative.   Neurological: Positive for dizziness. Negative for tingling, tremors, seizures and loss of consciousness.  Endo/Heme/Allergies: Negative.   Psychiatric/Behavioral: Positive for depression. Negative for suicidal ideas, hallucinations and substance abuse.  [name]. The patient does not have insomnia.     Blood pressure 117/78, pulse 92, temperature 98.3 F (36.8 C), temperature source Oral, resp. rate 18, height 5.5" (0.14 m), weight 95.255 kg (210 lb).Body mass index is 4,859.95 kg/(m^2).  General Appearance: Casual  Eye Contact::  Fair  Speech:  Clear and Coherent  Volume:  Decreased  Mood:  Anxious and Dysphoric  Affect:  Congruent  Thought Process:  Linear  Orientation:  Full (Time, Place, and Person)  Thought Content:  Obsessions and Rumination  Suicidal Thoughts:  No  Homicidal Thoughts:  No  Memory:  Immediate;   Good Recent;   Good Remote;   Good  Judgement:  Fair  Insight:  Fair  Psychomotor Activity:  Psychomotor Retardation  Concentration:  Good  Recall:  Good  Akathisia:  No  Handed:  Right  AIMS (if indicated):     Assets:  Communication Skills Desire for Improvement  Sleep:  Number of Hours: 5.25   Current Medications: Current Facility-Administered Medications  Medication Dose Route Frequency Provider Last Rate Last Dose  . alum & mag hydroxide-simeth (MAALOX/MYLANTA) 200-200-20 MG/5ML suspension 30 mL  30 mL Oral Q4H PRN Court Joy, PA-C      . ARIPiprazole (ABILIFY) tablet 2 mg  2 mg Oral QHS Court Joy, PA-C   2 mg at 04/24/13 2124  . buPROPion [facility]) tablet 75 mg  75 mg Oral Daily Court Joy, PA-C   75 mg at 04/25/13 0804  . clonazePAM (KLONOPIN) tablet 0.5 mg  0.5 mg Oral TID PRN Janetta Hora  Eloisa Northern, PA-C   0.5 mg at 04/25/13 1157  . estradiol (ESTRACE) tablet 2 mg  2 mg Oral Daily Himabindu Ravi, MD   2 mg at 04/25/13 0804  . furosemide (LASIX) tablet 20 mg  20 mg Oral Daily Himabindu Ravi, MD   20 mg at 04/25/13 0804  . hydrOXYzine (ATARAX/VISTARIL) tablet 50 mg  50 mg Oral QHS PRN,MR X 1 Court Joy, PA-C      . lamoTRIgine (LAMICTAL) tablet 50 mg  50 mg Oral Daily Court Joy, PA-C   50 mg at 04/25/13 0804  . lisinopril (PRINIVIL,ZESTRIL) tablet 20 mg  20 mg  Oral Daily Court Joy, PA-C   20 mg at 04/25/13 0804  . magnesium hydroxide (MILK OF MAGNESIA) suspension 30 mL  30 mL Oral Daily PRN Court Joy, PA-C      . medroxyPROGESTERone (PROVERA) tablet 2.5 mg  2.5 mg Oral Daily Himabindu Ravi, MD   2.5 mg at 04/25/13 0804  . metFORMIN (GLUCOPHAGE) tablet 500 mg  500 mg Oral BID WC Court Joy, PA-C   500 mg at 04/25/13 0804  . metoprolol tartrate (LOPRESSOR) tablet 25 mg  25 mg Oral BID Court Joy, PA-C   25 mg at 04/25/13 0804  . venlafaxine XR (EFFEXOR-XR) 24 hr capsule 150 mg  150 mg Oral Q breakfast Himabindu Ravi, MD   150 mg at 04/25/13 1478    Lab Results:  Results for orders placed during the hospital encounter of 04/23/13 (from the past 48 hour(s))  GLUCOSE, CAPILLARY     Status: Abnormal   Collection Time    04/23/13  5:12 PM      Result Value Range   Glucose-Capillary 165 (*) 70 - 99 mg/dL  GLUCOSE, CAPILLARY     Status: Abnormal   Collection Time    04/24/13  5:57 AM      Result Value Range   Glucose-Capillary 128 (*) 70 - 99 mg/dL  GLUCOSE, CAPILLARY     Status: Abnormal   Collection Time    04/24/13 11:53 AM      Result Value Range   Glucose-Capillary 120 (*) 70 - 99 mg/dL   Comment 1 Documented in Chart     Comment 2 Notify RN    GLUCOSE, CAPILLARY     Status: Abnormal   Collection Time    04/24/13  5:14 PM      Result Value Range   Glucose-Capillary 107 (*) 70 - 99 mg/dL  GLUCOSE, CAPILLARY     Status: Abnormal   Collection Time    04/25/13  5:58 AM      Result Value Range   Glucose-Capillary 111 (*) 70 - 99 mg/dL    Physical Findings: AIMS: Facial and Oral Movements Muscles of Facial Expression: None, normal Lips and Perioral Area: None, normal Jaw: None, normal Tongue: None, normal,Extremity Movements Upper (arms, wrists, hands, fingers): None, normal Lower (legs, knees, ankles, toes): None, normal, Trunk Movements Neck, shoulders, hips: None, normal, Overall Severity Severity of abnormal  movements (highest score from questions above): None, normal Incapacitation due to abnormal movements: None, normal Patient's awareness of abnormal movements (rate only patient's report): No Awareness, Dental Status Current problems with teeth and/or dentures?: No Does patient usually wear dentures?: No  CIWA:    COWS:     Treatment Plan Summary: Daily contact with patient to assess and evaluate symptoms and progress in treatment Medication management  Plan: Conitnue Abilify, Lamictal, clonazepam, and effexor. May need  to DC wellbutrin if symptoms continue.  Medical Decision Making Problem Points:  Established problem, stable/improving (1), Review of last therapy session (1) and Review of psycho-social stressors (1) Data Points:  Review or order clinical lab tests (1) Review and summation of old records (2) Review of medication regiment & side effects (2)  I certify that inpatient services furnished can reasonably be expected to improve the patient's condition.   WATT,ALAN 04/25/2013, 1:27 PM   Discussed with provider. Reviewed note. Agree with above findings, assessment and plan.   Jacqulyn Cane, M.D.  04/25/2013 9:37 PM

## 2013-04-25 NOTE — Progress Notes (Signed)
[name] is OOB UAL on the 500 hall today...tolerated fair. She remins disconnected, flat, emotionless. She attends her groups, takes her scheduled meds as ordered and she completes her AM self inventory and on it she wrote she denies having SI within the past 24 hrs, she rates her depression and hopelessness " 5 / 10 " and shared that she has a poor appetite today and remians anxious over her future.\    A She requested and was given a prn klonopin 0.5 mg at 1157 and states she felt relief with this medication.    R Safety is in place and POC cont.

## 2013-04-25 NOTE — Progress Notes (Signed)
Goals  Group Note  Date: 04/25/2013 Time: 1015  Group Topic/Focus:  Making Healthy Choices:   The focus of this group is to help patients identify negative/unhealthy choices they were using prior to admission and identify positive/healthier coping strategies to replace them upon discharge.  Participation Level:  Active  Participation Quality:  Appropriate  Affect:  Appropriate  Cognitive:  Alert  Insight:  Engaged  Engagement in Group:  Engaged  Additional Comments:    04/25/2013,12:47 PM Sary Bogie, Joie Bimler

## 2013-04-25 NOTE — Progress Notes (Signed)
Psychoeducational Group Note  Date:  04/25/2013 Time: 1015 Group Topic/Focus:  Making Healthy Choices:   The focus of this group is to help patients identify negative/unhealthy choices they were using prior to admission and identify positive/healthier coping strategies to replace them upon discharge.  Participation Level:  Active  Participation Quality:  Appropriate  Affect:  Appropriate  Cognitive:  Appropriate  Insight:  Engaged  Engagement in Group:  Engaged  Additional Comments:    Rich Brave 1:54 PM. 04/25/2013

## 2013-04-25 NOTE — Progress Notes (Signed)
[facility] Group Notes:  (Nursing/MHT/Case Management/Adjunct)  Date:  04/25/2013  Time:  2000  Type of Therapy:  Psychoeducational Skills  Participation Level:  Active  Participation Quality:  Appropriate  Affect:  Flat  Cognitive:  Appropriate  Insight:  Good  Engagement in Group:  Engaged  Modes of Intervention:  Education  Summary of Progress/Problems: The patient mentioned in group that she had a good day. She stated that she both talked and laughed more than yesterday. Her goal for tomorrow is to look up some information on finding a support group for agoraphobia.   Hazle Coca S 04/25/2013, 9:24 PM

## 2013-04-26 DIAGNOSIS — F339 Major depressive disorder, recurrent, unspecified: Secondary | ICD-10-CM

## 2013-04-26 MED ORDER — ARIPIPRAZOLE 2 MG PO TABS: 2.0000 mg | ORAL_TABLET | Freq: Every day | ORAL | Status: DC

## 2013-04-26 MED ORDER — BUPROPION HCL 75 MG PO TABS: 75.0000 mg | ORAL_TABLET | Freq: Every day | ORAL | Status: DC

## 2013-04-26 MED ORDER — LISINOPRIL 20 MG PO TABS: 20.0000 mg | ORAL_TABLET | Freq: Every day | ORAL | Status: DC

## 2013-04-26 MED ORDER — VENLAFAXINE HCL ER 150 MG PO CP24: 150.0000 mg | ORAL_CAPSULE | Freq: Every morning | ORAL | Status: DC

## 2013-04-26 MED ORDER — METOPROLOL TARTRATE 25 MG PO TABS: 25.0000 mg | ORAL_TABLET | Freq: Two times a day (BID) | ORAL | Status: DC

## 2013-04-26 MED ORDER — LAMOTRIGINE 25 MG PO TABS: 50.0000 mg | ORAL_TABLET | Freq: Every day | ORAL | Status: DC

## 2013-04-26 MED ORDER — METFORMIN HCL 500 MG PO TABS: 500.0000 mg | ORAL_TABLET | Freq: Two times a day (BID) | ORAL | Status: DC

## 2013-04-26 MED ORDER — MEDROXYPROGESTERONE ACETATE 5 MG PO TABS: 2.5000 mg | ORAL_TABLET | Freq: Every morning | ORAL | Status: DC

## 2013-04-26 MED ORDER — CLONAZEPAM 0.5 MG PO TABS: 0.5000 mg | ORAL_TABLET | Freq: Three times a day (TID) | ORAL | Status: DC | PRN

## 2013-04-26 MED ORDER — ESTRADIOL 2 MG PO TABS: 2.0000 mg | ORAL_TABLET | Freq: Every morning | ORAL | Status: DC

## 2013-04-26 MED ORDER — HYDROXYZINE HCL 50 MG PO TABS: 50.0000 mg | ORAL_TABLET | Freq: Every evening | ORAL | Status: DC | PRN

## 2013-04-26 NOTE — Discharge Summary (Signed)
Physician Discharge Summary Note  Patient:  [name] is an 47 y.o., female MRN:  [id number] DOB:  [birth year] Patient phone:  [phone] (home)  Patient address:   [address] [place] [address],   Date of Admission:  04/23/2013 Date of Discharge: 04/26/13  Reason for Admission:  Depression with SI, Increased Anxiety  Discharge Diagnoses: Principal Problem:   Major depressive disorder, recurrent Active Problems:   Passive suicidal ideations   Substance induced mood disorder   Panic disorder   OCD (obsessive compulsive disorder)  Review of Systems  Constitutional: Negative.   HENT: Negative.   Eyes: Negative.   Respiratory: Negative.   Cardiovascular: Negative.   Gastrointestinal: Negative.   Genitourinary: Negative.   Musculoskeletal: Negative.   Skin: Negative.   Neurological: Negative.   Endo/Heme/Allergies: Negative.   Psychiatric/Behavioral: Positive for depression. Negative for suicidal ideas, hallucinations, memory loss and substance abuse. [name] and has insomnia.    Axis Diagnosis:   AXIS I:  Anxiety Disorder NOS, Major Depression, Recurrent severe and Post Traumatic Stress Disorder AXIS II:  Deferred AXIS III:   Past Medical History  Diagnosis Date  . Hypertension   . Diabetes mellitus   . Arthritis   . Depression   . Anxiety   . Headache   . History of borderline personality disorder   . Personality disorder    AXIS IV:  other psychosocial or environmental problems AXIS V:  61-70 mild symptoms  Level of Care:  OP  Hospital Course:  [name] is an 47 y.o. female. Pt psychiatrist Dr Orson Aloe called [facility] and reported pt is decompensating and needs admission. Pt reports she has become more and more anxious with increased panic attacks causing her depression to worsen. She had been on xanax for a long period of time and was taken off several months ago and has had panic attack several times a  day. She reports Dr Dan Humphreys started her back on Clonopin .5mg  bid and tid prn. Pt reports nothing is giving her relief and her depression has worsen. Her sister died 3 yrs ago at age 21 and she still has not gotten over that and her depression has brought her grief back. Pt reports for the past 2 days she has been having suicidal thoughts and does not have a plan but the thought of cutting her wrist continues to come to mind. Pt denies HI and is not psychotic. Pt reports she gets angry at herself often. 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 Pt is unable to contract for safety.   The duration of stay was three days. The patient was seen and evaluated by the Treatment team consisting of Psychiatrist, NP-C, RN, Case Manager, and Therapist for evaluation and treatment plan with goal of stabilization upon discharge. The patient's physical and mental health problems were identified and treated appropriately.      Multiple modalities of treatment were used including medication, individual and group therapies, unit programming, improved nutrition, physical activity, and family sessions as needed. Patient attended groups on the unit and interacted with peers. Patient's home medications of Abilify , Klonopin, Lamictal, and Effexor were continued upon admission. Of note Dr. Dan Humphreys had just prescribed Wellbutrin 75 mg daily and patient received her first dose in the hospital. By the second day of admission patient felt the medication was helping with her mood. She reported some mild sensations of dizziness, which she was encouraged to  inform Dr. Dan Humphreys of if symptom persisted.      The symptoms of depression and anxiety were monitored daily by evaluation by clinical provider.  The patient's mental and emotional status was evaluated by a daily self inventory completed by the patient.      Improvement was demonstrated by declining numbers on the self assessment, improving vital signs, increased  cognition, and improvement in mood, sleep, appetite as well as a reduction in physical symptoms.  Patient continued to complain of elevated symptoms of anxiety. It was mentioned to patient that she is on several medications to help with this symptom and that she must also explore other avenues such as becoming more involved in an activity or using coping skills like working out.      The patient was evaluated and found to be stable enough for discharge and was released to home per the initial plan of treatment. Patient seen by treatment team prior to her discharge during which [facility] denied SI/HI/AVH. She plans on joining a support group for those suffering from agoraphobia. [name] was found to be physically and mentally stable for discharge. Patient stated to treatment team "I've got to get back out there and start trying again." Patient reported to NP that she had all her medications at home and did not need prescriptions or sample medications. Patient also agrees to keep follow up appointments with Dr. Dan Humphreys.   Mental Status Exam:  For mental status exam please see mental status exam and  suicide risk assessment completed by attending physician prior to discharge.      Consults:  None  Significant Diagnostic Studies:  labs: Chem profile, CBC,  Discharge Vitals:   Blood pressure 124/81, pulse 98, temperature 97.8 F (36.6 C), temperature source Oral, resp. rate 18, height 5.5" (0.14 m), weight 95.255 kg (210 lb). Body mass index is 4,859.95 kg/(m^2). Lab Results:   Results for orders placed during the hospital encounter of 04/23/13 (from the past 72 hour(s))  GLUCOSE, CAPILLARY     Status: Abnormal   Collection Time    04/23/13 11:57 AM      Result Value Range   Glucose-Capillary 109 (*) 70 - 99 mg/dL  GLUCOSE, CAPILLARY     Status: Abnormal   Collection Time    04/23/13  5:12 PM      Result Value Range   Glucose-Capillary 165 (*) 70 - 99 mg/dL  GLUCOSE, CAPILLARY     Status: Abnormal    Collection Time    04/24/13  5:57 AM      Result Value Range   Glucose-Capillary 128 (*) 70 - 99 mg/dL  GLUCOSE, CAPILLARY     Status: Abnormal   Collection Time    04/24/13 11:53 AM      Result Value Range   Glucose-Capillary 120 (*) 70 - 99 mg/dL   Comment 1 Documented in Chart     Comment 2 Notify RN    GLUCOSE, CAPILLARY     Status: Abnormal   Collection Time    04/24/13  5:14 PM      Result Value Range   Glucose-Capillary 107 (*) 70 - 99 mg/dL  GLUCOSE, CAPILLARY     Status: Abnormal   Collection Time    04/25/13  5:58 AM      Result Value Range   Glucose-Capillary 111 (*) 70 - 99 mg/dL  GLUCOSE, CAPILLARY     Status: Abnormal   Collection Time    04/25/13  5:12 PM  Result Value Range   Glucose-Capillary 137 (*) 70 - 99 mg/dL   Comment 1 Documented in Chart     Comment 2 Notify RN    GLUCOSE, CAPILLARY     Status: Abnormal   Collection Time    04/26/13  5:52 AM      Result Value Range   Glucose-Capillary 122 (*) 70 - 99 mg/dL    Physical Findings: AIMS: Facial and Oral Movements Muscles of Facial Expression: None, normal Lips and Perioral Area: None, normal Jaw: None, normal Tongue: None, normal,Extremity Movements Upper (arms, wrists, hands, fingers): None, normal Lower (legs, knees, ankles, toes): None, normal, Trunk Movements Neck, shoulders, hips: None, normal, Overall Severity Severity of abnormal movements (highest score from questions above): None, normal Incapacitation due to abnormal movements: None, normal Patient's awareness of abnormal movements (rate only patient's report): No Awareness, Dental Status Current problems with teeth and/or dentures?: No Does patient usually wear dentures?: No  CIWA:    COWS:     Psychiatric Specialty Exam: See Psychiatric Specialty Exam and Suicide Risk Assessment completed by Attending Physician prior to discharge.  Discharge destination:  Home  Is patient on multiple antipsychotic therapies at discharge:   No   Has Patient had three or more failed trials of antipsychotic monotherapy by history:  No  Recommended Plan for Multiple Antipsychotic Therapies: N/A  Discharge Orders   Future Orders Complete By Expires     Activity as tolerated - No restrictions  As directed         Medication List    TAKE these medications     Indication   ARIPiprazole 2 MG tablet  Commonly known as:  ABILIFY  Take 1 tablet (2 mg total) by mouth daily.   Indication:  Major Depressive Disorder     buPROPion 75 MG tablet  Commonly known as:  WELLBUTRIN  Take 1 tablet (75 mg total) by mouth daily. Take by mouth one a day for 1st day, two a day for the 2nd and 3rd day, then 3 a day thereafter   Indication:  Major Depressive Disorder     clonazePAM 0.5 MG tablet  Commonly known as:  KLONOPIN  Take 1 tablet (0.5 mg total) by mouth 3 (three) times daily as needed.   Indication:  Panic Disorder     estradiol 2 MG tablet  Commonly known as:  ESTRACE  Take 1 tablet (2 mg total) by mouth every morning. For estrogen hormone replacement   Indication:  Deficiency of the Hormone Estrogen     furosemide 20 MG tablet  Commonly known as:  LASIX  Take 20 mg by mouth every morning. For lower extremity swellings.   Indication:  High Blood Pressure     hydrOXYzine 50 MG tablet  Commonly known as:  ATARAX/VISTARIL  Take 1 tablet (50 mg total) by mouth at bedtime as needed and may repeat dose one time if needed (sleep).   Indication:  sleep     lamoTRIgine 25 MG tablet  Commonly known as:  LAMICTAL  Take 2 tablets (50 mg total) by mouth daily. For mood stabilization, any higher dose causes panic, migraines, and nausea   Indication:  Depression     lisinopril 20 MG tablet  Commonly known as:  PRINIVIL,ZESTRIL  Take 1 tablet (20 mg total) by mouth daily. For control of high blood pressure   Indication:  High Blood Pressure     medroxyPROGESTERone 5 MG tablet  Commonly known as:  PROVERA  Take 0.5  tablets (2.5  mg total) by mouth every morning.   Indication:  Symptoms Following Menopause     metFORMIN 500 MG tablet  Commonly known as:  GLUCOPHAGE  Take 1 tablet (500 mg total) by mouth 2 (two) times daily with a meal. For diabetes control   Indication:  Type 2 Diabetes     metoprolol tartrate 25 MG tablet  Commonly known as:  LOPRESSOR  Take 1 tablet (25 mg total) by mouth 2 (two) times daily. For hypertension   Indication:  High Blood Pressure     venlafaxine XR 150 MG 24 hr capsule  Commonly known as:  EFFEXOR-XR  Take 1 capsule (150 mg total) by mouth every morning.   Indication:  Major Depressive Disorder, Panic Disorder         Follow-up recommendations:  Activity:  Resume usual activities Diet:  Diabetic Diet  Comments:   Take all your medications as prescribed by your mental healthcare provider.  Report any adverse effects and or reactions from your medicines to your outpatient provider promptly.  Patient is instructed and cautioned to not engage in alcohol and or illegal drug use while on prescription medicines.  In the event of worsening symptoms, patient is instructed to call the crisis hotline, 911 and or go to the nearest ED for appropriate evaluation and treatment of symptoms.  Follow-up with your primary care provider for your other medical issues, concerns and or health care needs.     Total Discharge Time:  Greater than 30 minutes.  SignedFransisca Kaufmann NP-C 04/26/2013, 11:19 AM

## 2013-04-26 NOTE — [facility] Suicide Risk Assessment (Signed)
[facility] INPATIENT:  Family/Significant Other Suicide Prevention Education  Suicide Prevention Education:  Education Completed;Brenda Smothers, Sister, [facility], has been identified by the patient as the family member/significant other with whom the patient will be residing, and identified as the person(s) who will aid the patient in the event of a mental health crisis (suicidal ideations/suicide attempt).  With written consent from the patient, the family member/significant other has been provided the following suicide prevention education, prior to the and/or following the discharge of the patient.  The suicide prevention education provided includes the following:  Suicide risk factors  Suicide prevention and interventions  National Suicide Hotline telephone number  [facility] assessment telephone number  [facility] Emergency Assistance 911  [facility] and/or Residential Mobile Crisis Unit telephone number  Request made of family/significant other to:  Remove weapons (e.g., guns, rifles, knives), all items previously/currently identified as safety concern.  Sister advised patient does not have access to a gun.  Remove drugs/medications (over-the-counter, prescriptions, illicit drugs), all items previously/currently identified as a safety concern.  The family member/significant other verbalizes understanding of the suicide prevention education information provided.  The family member/significant other agrees to remove the items of safety concern listed above.  Ripley Bogosian Hairston 04/26/2013, 1:10 PM

## 2013-04-26 NOTE — Plan of Care (Signed)
Problem: Ineffective individual coping Goal: STG: Patient will participate in after care plan Outcome: Completed/Met Date Met:  04/26/13 Patient attended groups and talked about problems leading to admission.  She has outpatient services with [facility] in [place], [place].  Juline Patch, LCSW 04/26/2013 '

## 2013-04-26 NOTE — Tx Team (Signed)
Interdisciplinary Treatment Plan Update   Date Reviewed:  04/26/2013  Time Reviewed:  9:44 AM  Progress in Treatment:   Attending groups: Yes Participating in groups: Yes Taking medication as prescribed: Yes  Tolerating medication: Yes Family/Significant other contact made: yes, contact made with sister. Patient understands diagnosis: Yes  Discussing patient identified problems/goals with staff: Yes Medical problems stabilized or resolved: Yes Denies suicidal/homicidal ideation: Yes Patient has not harmed self or others: Yes  For review of initial/current patient goals, please see plan of care.  Estimated Length of Stay: Discharge home today.  Reasons for Continued Hospitalization:   New Problems/Goals identified:    Discharge Plan or Barriers:   Home with outpatient follow up [facility]  Additional Comments:  Patient reports being much improved and ready to discharge home today.  She rates depression at five and anxiety at nine (she reports nine is in regard to having anxiety).   Attendees:  Patient: [name] 04/26/2013 9:44 AM   Signature: [name] North, MD 04/26/2013 9:44 AM  Signature:  Neill Loft, RN 04/26/2013 9:44 AM  Signature:  Frankey Shown, Care Coord. 04/26/2013 9:44 AM  Signature: 04/26/2013 9:44 AM  Signature:  Quintella Reichert, RN 04/26/2013 9:44 AM  Signature:  Juline Patch, LCSW 04/26/2013 9:44 AM  Signature:  04/26/2013 9:44 AM  Signature: Ila Mcgill, MHT 04/26/2013 9:44 AM  Signature: Fransisca Kaufmann, [facility] 04/26/2013 9:44 AM  Signature:    Signature:    Signature:      Scribe for Treatment Team:   Juline Patch,  04/26/2013 9:44 AM

## 2013-04-26 NOTE — ED Notes (Signed)
+   Urine Chart sent to EDP office for review. 

## 2013-04-26 NOTE — Progress Notes (Signed)
Patient ID: [name], female   DOB: 1966/06/30, 47 y.o.   MRN: [id number] Patient is discharged ambulatory and is being driven home by her sister.  She denies SI/HI.  She verbalizes understanding of her discharge meds and follow up. She was given one script and no sample meds. She is appreciative of care received.  Says it has been 3 months since she was here last and hopes to do well after d/c.

## 2013-04-26 NOTE — Care Management Utilization Note (Signed)
PER STATE REGULATIONS 482.30  THIS CHART WAS REVIEWED FOR MEDICAL NECESSITY WITH RESPECT TO THE PATIENT'S ADMISSION/ DURATION OF STAY.  NEXT REVIEW DATE: 04/29/13

## 2013-04-26 NOTE — Plan of Care (Signed)
Problem: Alteration in mood Goal: LTG-Pt's behavior demonstrates decreased signs of depression (Patient's behavior demonstrates decreased signs of depression to the point the patient is safe to return home and continue treatment in an outpatient setting)  Outcome: Completed/Met Date Met:  04/26/13 Patient is rating depression at five today.  She rated depression at ten on admission.  Horace Porteous Larcenia Holaday, LCSW  04/26/2013

## 2013-04-26 NOTE — [facility] Suicide Risk Assessment (Signed)
Suicide Risk Assessment  Discharge Assessment     Demographic Factors:  Caucasian, Low socioeconomic status and Unemployed, female  Mental Status Per Nursing Assessment::   On Admission:  NA  Current Mental Status by Physician: Alert and oriented to 4. Denies AH/VH/SI/HI.  Loss Factors: Decrease in vocational status  Historical Factors: Family history of mental illness or substance abuse and Impulsivity  Risk Reduction Factors:   Sense of responsibility to family, Living with another person, especially a relative, Positive social support and Positive coping skills or problem solving skills  Continued Clinical Symptoms:  Depression:   Recent sense of peace/wellbeing  Cognitive Features That Contribute To Risk:  Cognitively intact  Suicide Risk:  Minimal: No identifiable suicidal ideation.  Patients presenting with no risk factors but with morbid ruminations; may be classified as minimal risk based on the severity of the depressive symptoms  Discharge Diagnoses:   AXIS I:  Major Depression, Recurrent severe AXIS II:  Deferred AXIS III:   Past Medical History  Diagnosis Date  . Hypertension   . Diabetes mellitus   . Arthritis   . Depression   . Anxiety   . Headache   . History of borderline personality disorder   . Personality disorder    AXIS IV:  other psychosocial or environmental problems AXIS V:  61-70 mild symptoms  Plan Of Care/Follow-up recommendations:  Activity:  as tolerated Diet:  low salt and low fat diet Follow up with outpatient appointments.  Is patient on multiple antipsychotic therapies at discharge:  No   Has Patient had three or more failed trials of antipsychotic monotherapy by history:  No  Recommended Plan for Multiple Antipsychotic Therapies: NA  Byrdie Miyazaki 04/26/2013, 10:41 AM

## 2013-04-26 NOTE — Progress Notes (Signed)
[facility] Adult Case Management Discharge Plan :  Will you be returning to the same living situation after discharge: Yes,  Patient is returning home. At discharge, do you have transportation home?:Patient to arrange transportation home.  Do you have the ability to pay for your medications: Patient is able to obtain medications.   Release of information consent forms completed and in the chart;  Patient's signature needed at discharge.  Patient to Follow up at: Follow-up Information   Follow up with Dr. Dan Humphreys - [facility] On 04/28/2013. (You are scheduled with Dr. Dan Humphreys on Wednesday, April 28, 2013 at [facility])    Contact information:   [address] [place], [place]   [address]      Follow up with Graylin Shiver On 04/29/2013. ( Thursday, Apr 29, 2013 at 11:00 AM)    Contact information:   [address] [place], [place]   [address]  [phone]      Patient denies SI/HI:   Patient no longer endorsing SI/HI or other thoughts of self harm.     Safety Planning and Suicide Prevention discussed: .Reviewed with all patients during discharge planning group  Tirsa Gail, Joesph July 04/26/2013, 1:09 PM

## 2013-04-26 NOTE — Plan of Care (Signed)
Problem: Alteration in mood; excessive anxiety as evidenced by: Goal: LTG-Patient's behavior demonstrates decreased anxiety (Patient's behavior demonstrates anxiety and he/she is utilizing learned coping skills to deal with anxiety-producing situations)  Outcome: Completed/Met Date Met:  04/26/13 Patient rated depression at seven (reports anxiety high just from being anxious but no other reason).  Horace Porteous Donavyn Fecher, LCSW 04/26/2013

## 2013-04-28 ENCOUNTER — Encounter (HOSPITAL_COMMUNITY): Payer: Self-pay | Admitting: Psychiatry

## 2013-04-28 ENCOUNTER — Ambulatory Visit (INDEPENDENT_AMBULATORY_CARE_PROVIDER_SITE_OTHER): Payer: Medicare Other | Admitting: Psychiatry

## 2013-04-28 VITALS — BP 121/87 | HR 86 | Ht 65.0 in | Wt 210.6 lb

## 2013-04-28 DIAGNOSIS — F1320 Sedative, hypnotic or anxiolytic dependence, uncomplicated: Secondary | ICD-10-CM

## 2013-04-28 DIAGNOSIS — F332 Major depressive disorder, recurrent severe without psychotic features: Secondary | ICD-10-CM

## 2013-04-28 DIAGNOSIS — F429 Obsessive-compulsive disorder, unspecified: Secondary | ICD-10-CM | POA: Diagnosis not present

## 2013-04-28 DIAGNOSIS — F603 Borderline personality disorder: Secondary | ICD-10-CM

## 2013-04-28 DIAGNOSIS — F1994 Other psychoactive substance use, unspecified with psychoactive substance-induced mood disorder: Secondary | ICD-10-CM

## 2013-04-28 DIAGNOSIS — F339 Major depressive disorder, recurrent, unspecified: Secondary | ICD-10-CM

## 2013-04-28 DIAGNOSIS — F410 Panic disorder [episodic paroxysmal anxiety] without agoraphobia: Secondary | ICD-10-CM

## 2013-04-28 MED ORDER — HYDROXYZINE HCL 10 MG PO TABS: 10.0000 mg | ORAL_TABLET | Freq: Four times a day (QID) | ORAL | Status: DC | PRN

## 2013-04-28 NOTE — Patient Instructions (Signed)
Try the higher dose of Wellbutrin and the Vistaril at a variety of doses in the face of a variety of challenges.  Take care of yourself.  No one else is standing up to do the job and only you know what you need.   GET SERIOUS about taking care of yourself.  Do the next right thing and that often means doing something to care for yourself along the lines of are you hungry, are you angry, are you lonely, are you tired, are you scared?  HALTS is what that stands for.  Call if problems or concerns.

## 2013-04-28 NOTE — Progress Notes (Signed)
[facility]  [phone] Progress Note  [name]  MRN: [id number]  DOB: 10-Jul-1966  Age: 47 y.o.  Date: 03/01/2013  Start Time: 9:25 AM  End Time: 9:50 AM   Chief Complaint:  Chief Complaint  Patient presents with  . Anxiety  . Depression  . Follow-up  . Medication Refill     Subjective:  "I feel worse since going into the hospital".  Depression 5/10 and Anxiety 6 that went to 8 while waiting in the waiting room/10, where 1 is the best and 10 is the worst. Pain is 0/10.  Pt comes for her follow-up appointment. Pt reports that she is compliant with the psychotropic medications with fair benefit and some side effects.   She went on a support group called [email or web address].  She checked on the [employer] fro a yoga class.  She has started some light exercise, some dancing.  Diagnosis:  Axis I: Major Depression, Recurrent severe and Obsessive Compulsive Disorder  Axis II: Borderline Personality Dis.  Axis III:  Past Medical History   Diagnosis  Date   .  Hypertension    .  Diabetes mellitus    .  Arthritis    .  Depression    .  Anxiety    .  Headache    .  History of borderline personality disorder    .  Personality disorder    Axis IV: other psychosocial or environmental problems  Axis V: 51-60 moderate symptoms  ADL's: Intact  Sleep: Good  Appetite: Too good perhaps related to being back on Abilify  Psychiatric Specialty Exam:  Review of Systems  Constitutional: Negative.  Eyes: Negative.  Respiratory: Negative.  Cardiovascular: Negative.  Gastrointestinal: Negative.  Genitourinary: Negative.  Musculoskeletal: Negative.  Skin: Negative.  Neurological: Positive for headaches.  Endo/Heme/Allergies: Negative.  Psychiatric/Behavioral: Positive for depression. [name].   Vitals: BP 121/87  Pulse 86  Ht 5\' 5"  (1.651 m)  Wt 210 lb 9.6 oz (95.528 kg)  BMI 35.05 kg/m2 Down 1 pound from last visit.  General Appearance: Casual   Eye Contact::  Fair   Speech: Normal Rate   Volume: Normal   Mood: Very distarught   Affect: Constricted   Thought Process: Coherent   Orientation: Full (Time, Place, and Person)   Thought Content: WDL   Suicidal Thoughts: No   Homicidal Thoughts: No   Memory: Immediate; Fair  Recent; Fair  Remote; Fair   Judgement: Fair   Insight: Fair   Psychomotor Activity: Normal   Concentration: Fair   Recall: Fair   Akathisia: No   Handed: Right   AIMS (if indicated):   Assets: Communication Skills  Desire for Improvement  Housing  Social Support   Sleep: 16 in the last 24 hours.   Current Medications:  Effexor XR 150 mg every AM  Lamictal 25 mg 2 every AM  Klonopin 0.5 mg BID-TID  Abilify 2 mg every bed time Wellbutrin 75 mg in AM  Lab Results:  Results for orders placed during the hospital encounter of 04/23/13 (from the past 2016 hour(s))  GLUCOSE, CAPILLARY   Collection Time    04/23/13  6:16 AM      Result Value Range   Glucose-Capillary 115 (*) 70 - 99 mg/dL  GLUCOSE, CAPILLARY   Collection Time    04/23/13 11:57 AM      Result Value Range   Glucose-Capillary 109 (*) 70 - 99 mg/dL  GLUCOSE, CAPILLARY   Collection Time  04/23/13  5:12 PM      Result Value Range   Glucose-Capillary 165 (*) 70 - 99 mg/dL  GLUCOSE, CAPILLARY   Collection Time    04/24/13  5:57 AM      Result Value Range   Glucose-Capillary 128 (*) 70 - 99 mg/dL  GLUCOSE, CAPILLARY   Collection Time    04/24/13 11:53 AM      Result Value Range   Glucose-Capillary 120 (*) 70 - 99 mg/dL   Comment 1 Documented in Chart     Comment 2 Notify RN    GLUCOSE, CAPILLARY   Collection Time    04/24/13  5:14 PM      Result Value Range   Glucose-Capillary 107 (*) 70 - 99 mg/dL  GLUCOSE, CAPILLARY   Collection Time    04/25/13  5:58 AM      Result Value Range   Glucose-Capillary 111 (*) 70 - 99 mg/dL  GLUCOSE, CAPILLARY   Collection Time    04/25/13  5:12 PM      Result Value Range   Glucose-Capillary 137 (*)  70 - 99 mg/dL   Comment 1 Documented in Chart     Comment 2 Notify RN    GLUCOSE, CAPILLARY   Collection Time    04/26/13  5:52 AM      Result Value Range   Glucose-Capillary 122 (*) 70 - 99 mg/dL  Results for orders placed during the hospital encounter of 04/22/13 (from the past 2016 hour(s))  URINE CULTURE   Collection Time    04/22/13  5:04 PM      Result Value Range   Specimen Description URINE, CLEAN CATCH     Special Requests NONE     Culture  Setup Time 04/23/2013 03:27     Colony Count >=100,000 COLONIES/ML     Culture ESCHERICHIA COLI     Report Status 04/25/2013 FINAL     Organism ID, Bacteria ESCHERICHIA COLI    URINE RAPID DRUG SCREEN (HOSP PERFORMED)   Collection Time    04/22/13  5:04 PM      Result Value Range   Opiates NONE DETECTED  NONE DETECTED   Cocaine NONE DETECTED  NONE DETECTED   Benzodiazepines POSITIVE (*) NONE DETECTED   Amphetamines NONE DETECTED  NONE DETECTED   Tetrahydrocannabinol NONE DETECTED  NONE DETECTED   Barbiturates NONE DETECTED  NONE DETECTED  URINALYSIS, ROUTINE W REFLEX MICROSCOPIC   Collection Time    04/22/13  5:04 PM      Result Value Range   Color, Urine YELLOW  YELLOW   APPearance HAZY (*) CLEAR   Specific Gravity, Urine >1.030 (*) 1.005 - 1.030   pH 5.5  5.0 - 8.0   Glucose, UA NEGATIVE  NEGATIVE mg/dL   Hgb urine dipstick NEGATIVE  NEGATIVE   Bilirubin Urine NEGATIVE  NEGATIVE   Ketones, ur NEGATIVE  NEGATIVE mg/dL   Protein, ur NEGATIVE  NEGATIVE mg/dL   Urobilinogen, UA 0.2  0.0 - 1.0 mg/dL   Nitrite NEGATIVE  NEGATIVE   Leukocytes, UA SMALL (*) NEGATIVE  URINE MICROSCOPIC-ADD ON   Collection Time    04/22/13  5:04 PM      Result Value Range   Squamous Epithelial / LPF FEW (*) RARE   WBC, UA 7-10  <3 WBC/hpf   Bacteria, UA FEW (*) RARE  CBC WITH DIFFERENTIAL   Collection Time    04/22/13  5:14 PM      Result Value Range  WBC 9.5  4.0 - 10.5 K/uL   RBC 5.20 (*) 3.87 - 5.11 MIL/uL   Hemoglobin 13.7  12.0 -  15.0 g/dL   HCT 96.0  45.4 - 09.8 %   MCV 77.9 (*) 78.0 - 100.0 fL   MCH 26.3  26.0 - 34.0 pg   MCHC 33.8  30.0 - 36.0 g/dL   RDW 11.9  14.7 - 82.9 %   Platelets 207  150 - 400 K/uL   Neutrophils Relative 62  43 - 77 %   Neutro Abs 5.9  1.7 - 7.7 K/uL   Lymphocytes Relative 30  12 - 46 %   Lymphs Abs 2.9  0.7 - 4.0 K/uL   Monocytes Relative 5  3 - 12 %   Monocytes Absolute 0.5  0.1 - 1.0 K/uL   Eosinophils Relative 2  0 - 5 %   Eosinophils Absolute 0.2  0.0 - 0.7 K/uL   Basophils Relative 0  0 - 1 %   Basophils Absolute 0.0  0.0 - 0.1 K/uL  COMPREHENSIVE METABOLIC PANEL   Collection Time    04/22/13  5:14 PM      Result Value Range   Sodium 140  135 - 145 mEq/L   Potassium 3.8  3.5 - 5.1 mEq/L   Chloride 100  96 - 112 mEq/L   CO2 26  19 - 32 mEq/L   Glucose, Bld 93  70 - 99 mg/dL   BUN 13  6 - 23 mg/dL   Creatinine, Ser 5.62  0.50 - 1.10 mg/dL   Calcium 9.8  8.4 - 13.0 mg/dL   Total Protein 7.2  6.0 - 8.3 g/dL   Albumin 3.7  3.5 - 5.2 g/dL   AST 44 (*) 0 - 37 U/L   ALT 29  0 - 35 U/L   Alkaline Phosphatase 103  39 - 117 U/L   Total Bilirubin 0.2 (*) 0.3 - 1.2 mg/dL   GFR calc non Af Amer >90  >90 mL/min   GFR calc Af Amer >90  >90 mL/min  ETHANOL   Collection Time    04/22/13  5:14 PM      Result Value Range   Alcohol, Ethyl (B) <11  0 - 11 mg/dL  Results for orders placed in visit on 03/23/13 (from the past 2016 hour(s))  T3, FREE   Collection Time    03/23/13 12:13 PM      Result Value Range   T3, Free 2.8  2.3 - 4.2 pg/mL  T4, FREE   Collection Time    03/23/13 12:13 PM      Result Value Range   Free T4 1.36  0.80 - 1.80 ng/dL   Physical Findings:  AIMS: , , , ,  CIWA:  COWS:  Treatment Plan Summary:  Medication management   Plan/Discussion:  I took her vitals. I reviewed CC, tobacco/med/surg Hx, meds effects/ side effects, problem list, therapies and responses as well as current situation/symptoms discussed options.  Try the higher dose of Wellbutrin  and try the Vistaril for the anxiety See orders and pt instructions for more details.   MEDICATIONS this encounter: No orders of the defined types were placed in this encounter.   Medical Decision Making  Problem Points: Established problem, worsening (2),  Review of last therapy session (1) and Review of psycho-social stressors (1)  Data Points: Review or order clinical lab tests (1)  Review of medication regiment & side effects (2)  Review of new  medications or change in dosage (2)   I certify that outpatient services furnished can reasonably be expected to improve the patient's condition.  Orson Aloe, MD, [facility]

## 2013-04-29 DIAGNOSIS — F319 Bipolar disorder, unspecified: Secondary | ICD-10-CM | POA: Diagnosis not present

## 2013-04-29 DIAGNOSIS — E785 Hyperlipidemia, unspecified: Secondary | ICD-10-CM | POA: Diagnosis not present

## 2013-04-29 DIAGNOSIS — E119 Type 2 diabetes mellitus without complications: Secondary | ICD-10-CM | POA: Diagnosis not present

## 2013-04-29 DIAGNOSIS — I10 Essential (primary) hypertension: Secondary | ICD-10-CM | POA: Diagnosis not present

## 2013-04-29 NOTE — Progress Notes (Signed)
Patient Discharge Instructions:  Next Level Care Provider Has Access to the EMR, 04/29/13 Records provided to [facility] via CHL/Epic access. Records sent via mail to: [facility] [address] [place], [place] [address]  Jerelene Redden, 04/29/2013, 4:32 PM

## 2013-05-01 ENCOUNTER — Telehealth (HOSPITAL_COMMUNITY): Payer: Self-pay | Admitting: Emergency Medicine

## 2013-05-05 ENCOUNTER — Ambulatory Visit (HOSPITAL_COMMUNITY): Payer: Self-pay | Admitting: Psychiatry

## 2013-05-20 ENCOUNTER — Other Ambulatory Visit: Payer: Self-pay | Admitting: Obstetrics & Gynecology

## 2013-05-23 ENCOUNTER — Emergency Department (HOSPITAL_COMMUNITY)
Admission: EM | Admit: 2013-05-23 | Discharge: 2013-05-23 | Disposition: A | Discharge status: Home / Self Care | Payer: Medicare Other | Attending: Emergency Medicine | Admitting: Emergency Medicine

## 2013-05-23 ENCOUNTER — Encounter (HOSPITAL_COMMUNITY): Payer: Self-pay | Admitting: Emergency Medicine

## 2013-05-23 DIAGNOSIS — Z862 Personal history of diseases of the blood and blood-forming organs and certain disorders involving the immune mechanism: Secondary | ICD-10-CM | POA: Insufficient documentation

## 2013-05-23 DIAGNOSIS — M129 Arthropathy, unspecified: Secondary | ICD-10-CM | POA: Insufficient documentation

## 2013-05-23 DIAGNOSIS — E119 Type 2 diabetes mellitus without complications: Secondary | ICD-10-CM | POA: Insufficient documentation

## 2013-05-23 DIAGNOSIS — J020 Streptococcal pharyngitis: Secondary | ICD-10-CM | POA: Diagnosis not present

## 2013-05-23 DIAGNOSIS — F3289 Other specified depressive episodes: Secondary | ICD-10-CM | POA: Insufficient documentation

## 2013-05-23 DIAGNOSIS — Z79899 Other long term (current) drug therapy: Secondary | ICD-10-CM | POA: Diagnosis not present

## 2013-05-23 DIAGNOSIS — F329 Major depressive disorder, single episode, unspecified: Secondary | ICD-10-CM | POA: Diagnosis not present

## 2013-05-23 DIAGNOSIS — F411 Generalized anxiety disorder: Secondary | ICD-10-CM | POA: Diagnosis not present

## 2013-05-23 DIAGNOSIS — Z8659 Personal history of other mental and behavioral disorders: Secondary | ICD-10-CM | POA: Insufficient documentation

## 2013-05-23 DIAGNOSIS — R599 Enlarged lymph nodes, unspecified: Secondary | ICD-10-CM | POA: Diagnosis not present

## 2013-05-23 DIAGNOSIS — Z8639 Personal history of other endocrine, nutritional and metabolic disease: Secondary | ICD-10-CM | POA: Insufficient documentation

## 2013-05-23 DIAGNOSIS — I10 Essential (primary) hypertension: Secondary | ICD-10-CM | POA: Diagnosis not present

## 2013-05-23 DIAGNOSIS — F609 Personality disorder, unspecified: Secondary | ICD-10-CM | POA: Insufficient documentation

## 2013-05-23 DIAGNOSIS — R509 Fever, unspecified: Secondary | ICD-10-CM | POA: Diagnosis not present

## 2013-05-23 MED ORDER — PENICILLIN V POTASSIUM 500 MG PO TABS: 500.0000 mg | ORAL_TABLET | Freq: Three times a day (TID) | ORAL | Status: AC

## 2013-05-23 NOTE — ED Provider Notes (Signed)
History     CSN: [id number]  Arrival date & time 05/23/13  1300   First MD Initiated Contact with Patient 05/23/13 1455      Chief Complaint  Patient presents with  . Sore Throat  . Fever    (Consider location/radiation/quality/duration/timing/severity/associated sxs/prior treatment) HPI [name] is a 47 y.o. female who presents to the ED with sore throat. The sore throat started 2 days ago. Associated symptoms include fever, chills, swollen glands. Temp. Has been up to 101. Feels like strep has in the past. She denies any other problems. The history was provided by the patient.   Past Medical History  Diagnosis Date  . Hypertension   . Diabetes mellitus   . Arthritis   . Depression   . Anxiety   . Headache   . History of borderline personality disorder   . Personality disorder     Past Surgical History  Procedure Laterality Date  . Foot surgery    . Total abdominal hysterectomy w/ bilateral salpingoophorectomy      Family History  Problem Relation Age of Onset  . Depression Mother   . OCD Other   . ADD / ADHD Neg Hx   . Alcohol abuse Neg Hx   . Drug abuse Neg Hx   . Anxiety disorder Neg Hx   . Bipolar disorder Neg Hx   . Dementia Neg Hx   . Paranoid behavior Neg Hx   . Schizophrenia Neg Hx   . Seizures Neg Hx   . Sexual abuse Neg Hx   . Physical abuse Neg Hx   . Ovarian cancer Sister     History  Substance Use Topics  . Smoking status: Never Smoker   . Smokeless tobacco: Never Used  . Alcohol Use: No    OB History   Grav Para Term Preterm Abortions TAB SAB Ect Mult Living                  Review of Systems  Constitutional: Positive for fever and chills.  HENT: Positive for sore throat. Negative for ear pain.   Cardiovascular: Negative for chest pain.  Gastrointestinal: Negative for nausea, vomiting and abdominal pain.  Skin: Negative for rash.  Neurological: Negative for headaches.  Psychiatric/Behavioral: Negative for confusion.     Allergies  Neurontin; Lamictal; and Trazodone and nefazodone  Home Medications   Current Outpatient Rx  Name  Route  Sig  Dispense  Refill  . acetaminophen (TYLENOL) 500 MG tablet   Oral   Take 1,000 mg by mouth every 6 (six) hours as needed for pain.         . ARIPiprazole (ABILIFY) 2 MG tablet   Oral   Take 1 tablet (2 mg total) by mouth daily.         Marland Kitchen buPROPion (WELLBUTRIN) 75 MG tablet   Oral   Take 225 mg by mouth daily.         . clonazePAM (KLONOPIN) 0.5 MG tablet   Oral   Take 1 tablet (0.5 mg total) by mouth 3 (three) times daily as needed.   30 tablet      . estradiol (ESTRACE) 2 MG tablet   Oral   Take 2 mg by mouth daily.         . furosemide (LASIX) 20 MG tablet   Oral   Take 20 mg by mouth daily. For lower extremity swellings.         . lamoTRIgine (LAMICTAL) 25 MG  tablet   Oral   Take 2 tablets (50 mg total) by mouth daily. For mood stabilization, any higher dose causes panic, migraines, and nausea   60 tablet   1   . lisinopril (PRINIVIL,ZESTRIL) 20 MG tablet   Oral   Take 1 tablet (20 mg total) by mouth daily. For control of high blood pressure         . medroxyPROGESTERone (PROVERA) 5 MG tablet   Oral   Take 0.5 tablets (2.5 mg total) by mouth every morning.         . metFORMIN (GLUCOPHAGE) 500 MG tablet   Oral   Take 1 tablet (500 mg total) by mouth 2 (two) times daily with a meal. For diabetes control         . metoprolol tartrate (LOPRESSOR) 25 MG tablet   Oral   Take 1 tablet (25 mg total) by mouth 2 (two) times daily. For hypertension   60 tablet   0   . venlafaxine XR (EFFEXOR-XR) 150 MG 24 hr capsule   Oral   Take 1 capsule (150 mg total) by mouth every morning.           BP 115/63  Pulse 97  Temp(Src) 97.5 F (36.4 C) (Oral)  Resp 18  Ht 5\' 5"  (1.651 m)  Wt 210 lb (95.255 kg)  BMI 34.95 kg/m2  SpO2 97%  Physical Exam  Nursing note and vitals reviewed. Constitutional: She is oriented to  person, place, and time. She appears well-developed and well-nourished. No distress.  HENT:  Head: Normocephalic.  Right Ear: Tympanic membrane normal.  Left Ear: Tympanic membrane normal.  Nose: Nose normal.  Mouth/Throat: Uvula is midline and mucous membranes are normal. Posterior oropharyngeal erythema present.  Eyes: EOM are normal.  Neck: Normal range of motion. Neck supple.  Cardiovascular: Normal rate, regular rhythm and normal heart sounds.   Pulmonary/Chest: Effort normal and breath sounds normal.  Musculoskeletal: Normal range of motion.  Lymphadenopathy:    She has cervical adenopathy.  Neurological: She is alert and oriented to person, place, and time. No cranial nerve deficit.  Skin: Skin is warm and dry.  Psychiatric: She has a normal mood and affect. Her behavior is normal. Judgment and thought content normal.    ED Course  Procedures (including critical care time)  Labs Reviewed  RAPID STREP SCREEN - Abnormal; Notable for the following:    Streptococcus, Group A Screen (Direct) POSITIVE (*)    All other components within normal limits   MDM  47 y.o. female with sore throat positive strep screen. Will treat with penicillin and she will follow up with her doctor as needed. I have reviewed this patient's vital signs, nurses notes, appropriate labs and discussed findings with the patient and plan of care. She voices understanding. Patient stable for discharge home without any immediate complications.  No signs of tonsillar abscess at this time.    Medication List    TAKE these medications       penicillin v potassium 500 MG tablet  Commonly known as:  VEETID  Take 1 tablet (500 mg total) by mouth 3 (three) times daily.      ASK your doctor about these medications       acetaminophen 500 MG tablet  Commonly known as:  TYLENOL  Take 1,000 mg by mouth every 6 (six) hours as needed for pain.     ARIPiprazole 2 MG tablet  Commonly known as:  ABILIFY  Take 1  tablet (2 mg total) by mouth daily.     buPROPion 75 MG tablet  Commonly known as:  WELLBUTRIN  Take 225 mg by mouth daily.     clonazePAM 0.5 MG tablet  Commonly known as:  KLONOPIN  Take 1 tablet (0.5 mg total) by mouth 3 (three) times daily as needed.     estradiol 2 MG tablet  Commonly known as:  ESTRACE  Take 2 mg by mouth daily.     furosemide 20 MG tablet  Commonly known as:  LASIX  Take 20 mg by mouth daily. For lower extremity swellings.     lamoTRIgine 25 MG tablet  Commonly known as:  LAMICTAL  Take 2 tablets (50 mg total) by mouth daily. For mood stabilization, any higher dose causes panic, migraines, and nausea     lisinopril 20 MG tablet  Commonly known as:  PRINIVIL,ZESTRIL  Take 1 tablet (20 mg total) by mouth daily. For control of high blood pressure     medroxyPROGESTERone 5 MG tablet  Commonly known as:  PROVERA  Take 0.5 tablets (2.5 mg total) by mouth every morning.     metFORMIN 500 MG tablet  Commonly known as:  GLUCOPHAGE  Take 1 tablet (500 mg total) by mouth 2 (two) times daily with a meal. For diabetes control     metoprolol tartrate 25 MG tablet  Commonly known as:  LOPRESSOR  Take 1 tablet (25 mg total) by mouth 2 (two) times daily. For hypertension     venlafaxine XR 150 MG 24 hr capsule  Commonly known as:  EFFEXOR-XR  Take 1 capsule (150 mg total) by mouth every morning.              [place], [place] 05/23/13 [phone]

## 2013-05-23 NOTE — ED Notes (Signed)
Pt c/o sore throat and fever x 2 days. Hx of strep. nad

## 2013-05-24 NOTE — ED Provider Notes (Signed)
Medical screening examination/treatment/procedure(s) were performed by non-physician practitioner and as supervising physician I was immediately available for consultation/collaboration.   Timo Hartwig, MD 05/24/13 0812 

## 2013-05-25 ENCOUNTER — Ambulatory Visit (INDEPENDENT_AMBULATORY_CARE_PROVIDER_SITE_OTHER): Payer: Medicare Other | Admitting: Psychiatry

## 2013-05-25 ENCOUNTER — Encounter (HOSPITAL_COMMUNITY): Payer: Self-pay | Admitting: Psychiatry

## 2013-05-25 VITALS — BP 132/86 | Ht 65.5 in | Wt 202.8 lb

## 2013-05-25 DIAGNOSIS — F1320 Sedative, hypnotic or anxiolytic dependence, uncomplicated: Secondary | ICD-10-CM

## 2013-05-25 DIAGNOSIS — F603 Borderline personality disorder: Secondary | ICD-10-CM

## 2013-05-25 DIAGNOSIS — F1994 Other psychoactive substance use, unspecified with psychoactive substance-induced mood disorder: Secondary | ICD-10-CM

## 2013-05-25 DIAGNOSIS — F410 Panic disorder [episodic paroxysmal anxiety] without agoraphobia: Secondary | ICD-10-CM | POA: Diagnosis not present

## 2013-05-25 DIAGNOSIS — F429 Obsessive-compulsive disorder, unspecified: Secondary | ICD-10-CM | POA: Diagnosis not present

## 2013-05-25 DIAGNOSIS — F339 Major depressive disorder, recurrent, unspecified: Secondary | ICD-10-CM

## 2013-05-25 DIAGNOSIS — F332 Major depressive disorder, recurrent severe without psychotic features: Secondary | ICD-10-CM | POA: Diagnosis not present

## 2013-05-25 MED ORDER — VENLAFAXINE HCL ER 150 MG PO CP24: 150.0000 mg | ORAL_CAPSULE | Freq: Every morning | ORAL | Status: DC

## 2013-05-25 MED ORDER — BUPROPION HCL 75 MG PO TABS: 225.0000 mg | ORAL_TABLET | Freq: Every day | ORAL | Status: DC

## 2013-05-25 MED ORDER — CLONAZEPAM 0.5 MG PO TABS: 0.5000 mg | ORAL_TABLET | Freq: Three times a day (TID) | ORAL | Status: DC | PRN

## 2013-05-25 NOTE — Patient Instructions (Addendum)
Stop Abilify, lost effectiveness and may be contributing to increased appetite and weight gain.  In 1 week take only 1 Lamictal for 3 days then 1/2 for a week, then stop.  I want to see you back after that to decide any further steps.  Take care of yourself.  No one else is standing up to do the job and only you know what you need.   GET SERIOUS about taking care of yourself.  Do the next right thing and that often means doing something to care for yourself along the lines of are you hungry, are you angry, are you lonely, are you tired, are you scared?  HALTS is what that stands for.  Call if problems or concerns.

## 2013-05-25 NOTE — Progress Notes (Signed)
[facility]  [phone] Progress Note  [name]  MRN: [id number]  DOB: March 22, 1966  Age: 47 y.o.  Date: 03/01/2013  Start Time: 8:45 AM  End Time: 9:14 AM   Chief Complaint:  Chief Complaint  Patient presents with  . Depression  . Anxiety  . Follow-up  . Medication Refill    Subjective:  "I have been dieting NO cheeseburgers.  I want to stop the Abilify, Wellbutrin and Lamictal. I don't think they work.".  Depression 3/10 and Anxiety 9/10, where 1 is the best and 10 is the worst. Pain is 3/10 with her throat and sinuses and stomach bothering her from her strep throat.  Pt comes for her follow-up appointment. Pt reports that she is compliant with the psychotropic medications with poor benefit and some side effects.  She notes weight gain with the Abilify and not much benefit.  She asks to stop all of them.  She agrees to try off each separately   She is still doing a little light exercise and dancing.  She spoke of not being able to attend the [facility] where her daughter goes every summer and she had a panic attack.  She was so effected that she left and was not able to return at the end of the 3 hours for the awards ceremony.  Allergies: Allergies  Allergen Reactions  . Neurontin (Gabapentin) Other (See Comments)    THIRTY lb wt gain  . Lamictal (Lamotrigine) Other (See Comments)    At any dose higher than 50 mg once a day experiences dizziness, panic, and headaches.   . Trazodone And Nefazodone     Caused insomnia   Medical History: Past Medical History  Diagnosis Date  . Hypertension   . Diabetes mellitus   . Arthritis   . Depression   . Anxiety   . Headache   . History of borderline personality disorder   . Personality disorder   . Hyperlipidemia    Surgical History: Past Surgical History  Procedure Laterality Date  . Foot surgery    . Total abdominal hysterectomy w/ bilateral salpingoophorectomy     Family History: family history includes Depression  in her mother; OCD in her other; and Ovarian cancer in her sister.  There is no history of ADD / ADHD, and Alcohol abuse, and Drug abuse, and Anxiety disorder, and Bipolar disorder, and Dementia, and Paranoid behavior, and Schizophrenia, and Seizures, and Sexual abuse, and Physical abuse, . Reviewed again in today's visit and her hyperlipidemia has been added.  Diagnosis:  Axis I: Major Depression, Recurrent severe, Panic Disorder and Obsessive Compulsive Disorder  Axis II: Borderline Personality Dis.  Axis III:  Past Medical History   Diagnosis  Date   .  Hypertension    .  Diabetes mellitus    .  Arthritis    .  Depression    .  Anxiety    .  Headache    .  History of borderline personality disorder    .  Personality disorder    Axis IV: other psychosocial or environmental problems  Axis V: 51-60 moderate symptoms  ADL's: Intact  Sleep: Good  Appetite: Too good perhaps related to being back on Abilify  Psychiatric Specialty Exam:  Review of Systems  Constitutional: Negative.  Eyes: Negative.  Respiratory: Negative.  Cardiovascular: Negative.  Gastrointestinal: Negative.  Genitourinary: Negative.  Musculoskeletal: Negative.  Skin: Negative.  Neurological: Positive for headaches.  Endo/Heme/Allergies: Negative.  Psychiatric/Behavioral: Positive for depression. The  patient is nervous/anxious.   Vitals: BP 132/86  Ht 5' 5.5" (1.664 m)  Wt 202 lb 12.8 oz (91.989 kg)  BMI 33.22 kg/m2 Down 1 pound from last visit.  General Appearance: Casual   Eye Contact:: Fair   Speech: Normal Rate   Volume: Normal   Mood: Very distarught   Affect: Constricted   Thought Process: Coherent   Orientation: Full (Time, Place, and Person)   Thought Content: WDL   Suicidal Thoughts: No   Homicidal Thoughts: No   Memory: Immediate; Fair  Recent; Fair  Remote; Fair   Judgement: Fair   Insight: Fair   Psychomotor Activity: Normal   Concentration: Fair   Recall: Fair   Akathisia: No    Handed: Right   AIMS (if indicated):   Assets: Communication Skills  Desire for Improvement  Housing  Social Support   Sleep: 16 in the last 24 hours.   Current Medications:  Effexor XR 150 mg every AM  Lamictal 25 mg 2 every AM  Klonopin 0.5 mg BID-TID  Abilify 2 mg every bed time Wellbutrin 75 mg in AM  Lab Results:  Results for orders placed during the hospital encounter of 05/23/13 (from the past 2016 hour(s))  RAPID STREP SCREEN   Collection Time    05/23/13  1:23 PM      Result Value Range   Streptococcus, Group A Screen (Direct) POSITIVE (*) NEGATIVE  Results for orders placed during the hospital encounter of 04/23/13 (from the past 2016 hour(s))  GLUCOSE, CAPILLARY   Collection Time    04/23/13  6:16 AM      Result Value Range   Glucose-Capillary 115 (*) 70 - 99 mg/dL  GLUCOSE, CAPILLARY   Collection Time    04/23/13 11:57 AM      Result Value Range   Glucose-Capillary 109 (*) 70 - 99 mg/dL  GLUCOSE, CAPILLARY   Collection Time    04/23/13  5:12 PM      Result Value Range   Glucose-Capillary 165 (*) 70 - 99 mg/dL  GLUCOSE, CAPILLARY   Collection Time    04/24/13  5:57 AM      Result Value Range   Glucose-Capillary 128 (*) 70 - 99 mg/dL  GLUCOSE, CAPILLARY   Collection Time    04/24/13 11:53 AM      Result Value Range   Glucose-Capillary 120 (*) 70 - 99 mg/dL   Comment 1 Documented in Chart     Comment 2 Notify RN    GLUCOSE, CAPILLARY   Collection Time    04/24/13  5:14 PM      Result Value Range   Glucose-Capillary 107 (*) 70 - 99 mg/dL  GLUCOSE, CAPILLARY   Collection Time    04/25/13  5:58 AM      Result Value Range   Glucose-Capillary 111 (*) 70 - 99 mg/dL  GLUCOSE, CAPILLARY   Collection Time    04/25/13  5:12 PM      Result Value Range   Glucose-Capillary 137 (*) 70 - 99 mg/dL   Comment 1 Documented in Chart     Comment 2 Notify RN    GLUCOSE, CAPILLARY   Collection Time    04/26/13  5:52 AM      Result Value Range    Glucose-Capillary 122 (*) 70 - 99 mg/dL  Results for orders placed during the hospital encounter of 04/22/13 (from the past 2016 hour(s))  URINE CULTURE   Collection Time    04/22/13  5:04  PM      Result Value Range   Specimen Description URINE, CLEAN CATCH     Special Requests NONE     Culture  Setup Time 04/23/2013 03:27     Colony Count >=100,000 COLONIES/ML     Culture ESCHERICHIA COLI     Report Status 04/25/2013 FINAL     Organism ID, Bacteria ESCHERICHIA COLI    URINE RAPID DRUG SCREEN (HOSP PERFORMED)   Collection Time    04/22/13  5:04 PM      Result Value Range   Opiates NONE DETECTED  NONE DETECTED   Cocaine NONE DETECTED  NONE DETECTED   Benzodiazepines POSITIVE (*) NONE DETECTED   Amphetamines NONE DETECTED  NONE DETECTED   Tetrahydrocannabinol NONE DETECTED  NONE DETECTED   Barbiturates NONE DETECTED  NONE DETECTED  URINALYSIS, ROUTINE W REFLEX MICROSCOPIC   Collection Time    04/22/13  5:04 PM      Result Value Range   Color, Urine YELLOW  YELLOW   APPearance HAZY (*) CLEAR   Specific Gravity, Urine >1.030 (*) 1.005 - 1.030   pH 5.5  5.0 - 8.0   Glucose, UA NEGATIVE  NEGATIVE mg/dL   Hgb urine dipstick NEGATIVE  NEGATIVE   Bilirubin Urine NEGATIVE  NEGATIVE   Ketones, ur NEGATIVE  NEGATIVE mg/dL   Protein, ur NEGATIVE  NEGATIVE mg/dL   Urobilinogen, UA 0.2  0.0 - 1.0 mg/dL   Nitrite NEGATIVE  NEGATIVE   Leukocytes, UA SMALL (*) NEGATIVE  URINE MICROSCOPIC-ADD ON   Collection Time    04/22/13  5:04 PM      Result Value Range   Squamous Epithelial / LPF FEW (*) RARE   WBC, UA 7-10  <3 WBC/hpf   Bacteria, UA FEW (*) RARE  CBC WITH DIFFERENTIAL   Collection Time    04/22/13  5:14 PM      Result Value Range   WBC 9.5  4.0 - 10.5 K/uL   RBC 5.20 (*) 3.87 - 5.11 MIL/uL   Hemoglobin 13.7  12.0 - 15.0 g/dL   HCT 16.1  09.6 - 04.5 %   MCV 77.9 (*) 78.0 - 100.0 fL   MCH 26.3  26.0 - 34.0 pg   MCHC 33.8  30.0 - 36.0 g/dL   RDW 40.9  81.1 - 91.4 %    Platelets 207  150 - 400 K/uL   Neutrophils Relative % 62  43 - 77 %   Neutro Abs 5.9  1.7 - 7.7 K/uL   Lymphocytes Relative 30  12 - 46 %   Lymphs Abs 2.9  0.7 - 4.0 K/uL   Monocytes Relative 5  3 - 12 %   Monocytes Absolute 0.5  0.1 - 1.0 K/uL   Eosinophils Relative 2  0 - 5 %   Eosinophils Absolute 0.2  0.0 - 0.7 K/uL   Basophils Relative 0  0 - 1 %   Basophils Absolute 0.0  0.0 - 0.1 K/uL  COMPREHENSIVE METABOLIC PANEL   Collection Time    04/22/13  5:14 PM      Result Value Range   Sodium 140  135 - 145 mEq/L   Potassium 3.8  3.5 - 5.1 mEq/L   Chloride 100  96 - 112 mEq/L   CO2 26  19 - 32 mEq/L   Glucose, Bld 93  70 - 99 mg/dL   BUN 13  6 - 23 mg/dL   Creatinine, Ser 7.82  0.50 - 1.10 mg/dL  Calcium 9.8  8.4 - 10.5 mg/dL   Total Protein 7.2  6.0 - 8.3 g/dL   Albumin 3.7  3.5 - 5.2 g/dL   AST 44 (*) 0 - 37 U/L   ALT 29  0 - 35 U/L   Alkaline Phosphatase 103  39 - 117 U/L   Total Bilirubin 0.2 (*) 0.3 - 1.2 mg/dL   GFR calc non Af Amer >90  >90 mL/min   GFR calc Af Amer >90  >90 mL/min  ETHANOL   Collection Time    04/22/13  5:14 PM      Result Value Range   Alcohol, Ethyl (B) <11  0 - 11 mg/dL  Results for orders placed in visit on 03/23/13 (from the past 2016 hour(s))  T3, FREE   Collection Time    03/23/13 12:13 PM      Result Value Range   T3, Free 2.8  2.3 - 4.2 pg/mL  T4, FREE   Collection Time    03/23/13 12:13 PM      Result Value Range   Free T4 1.36  0.80 - 1.80 ng/dL   Physical Findings:  AIMS: , , , ,  CIWA:  COWS:  Treatment Plan Summary:  Medication management   Plan/Discussion:  I took her vitals. I reviewed CC, tobacco/med/surg Hx, meds effects/ side effects, problem list, therapies and responses as well as current situation/symptoms discussed options.  Try off Abilify as it may be causing side effects and not working very well.  Taper off Lamictal and see how that goes, then see back to see how that is going. See orders and pt  instructions for more details.   MEDICATIONS this encounter: Meds ordered this encounter  Medications  . buPROPion (WELLBUTRIN) 75 MG tablet    Sig: Take 3 tablets (225 mg total) by mouth daily.    Dispense:  90 tablet    Refill:  0  . venlafaxine XR (EFFEXOR-XR) 150 MG 24 hr capsule    Sig: Take 1 capsule (150 mg total) by mouth every morning.  . clonazePAM (KLONOPIN) 0.5 MG tablet    Sig: Take 1 tablet (0.5 mg total) by mouth 3 (three) times daily as needed.    Dispense:  90 tablet    Refill:  0   Medical Decision Making Problem Points:  Established problem, worsening (2), Review of last therapy session (1) and Review of psycho-social stressors (1) Data Points:  Review or order clinical lab tests (1) Review of medication regiment & side effects (2) Review of new medications or change in dosage (2)  I certify that outpatient services furnished can reasonably be expected to improve the patient's condition.   Orson Aloe, MD, [facility]

## 2013-06-15 ENCOUNTER — Encounter (HOSPITAL_COMMUNITY): Payer: Self-pay | Admitting: Psychiatry

## 2013-06-15 ENCOUNTER — Ambulatory Visit (INDEPENDENT_AMBULATORY_CARE_PROVIDER_SITE_OTHER): Payer: Medicare Other | Admitting: Psychiatry

## 2013-06-15 VITALS — BP 123/76 | HR 86 | Ht 65.0 in | Wt 203.4 lb

## 2013-06-15 DIAGNOSIS — F429 Obsessive-compulsive disorder, unspecified: Secondary | ICD-10-CM | POA: Diagnosis not present

## 2013-06-15 DIAGNOSIS — F1320 Sedative, hypnotic or anxiolytic dependence, uncomplicated: Secondary | ICD-10-CM

## 2013-06-15 DIAGNOSIS — F410 Panic disorder [episodic paroxysmal anxiety] without agoraphobia: Secondary | ICD-10-CM | POA: Diagnosis not present

## 2013-06-15 DIAGNOSIS — F603 Borderline personality disorder: Secondary | ICD-10-CM

## 2013-06-15 DIAGNOSIS — F339 Major depressive disorder, recurrent, unspecified: Secondary | ICD-10-CM

## 2013-06-15 DIAGNOSIS — F332 Major depressive disorder, recurrent severe without psychotic features: Secondary | ICD-10-CM | POA: Diagnosis not present

## 2013-06-15 DIAGNOSIS — F1994 Other psychoactive substance use, unspecified with psychoactive substance-induced mood disorder: Secondary | ICD-10-CM

## 2013-06-15 MED ORDER — CLONAZEPAM 0.5 MG PO TABS: 0.5000 mg | ORAL_TABLET | Freq: Three times a day (TID) | ORAL | Status: DC | PRN

## 2013-06-15 MED ORDER — VENLAFAXINE HCL ER 150 MG PO CP24: 150.0000 mg | ORAL_CAPSULE | Freq: Every morning | ORAL | Status: DC

## 2013-06-15 MED ORDER — BUPROPION HCL 75 MG PO TABS: 150.0000 mg | ORAL_TABLET | Freq: Every day | ORAL | Status: DC

## 2013-06-15 NOTE — Progress Notes (Signed)
[facility]  [phone] Progress Note  [name]  MRN: [id number]  DOB: 1966-02-11  Age: 47 y.o.  Date: 03/01/2013  Start Time: 11:00 AM  End Time: 11:30AM   Chief Complaint:  Chief Complaint  Patient presents with  . Anxiety  . Depression  . Follow-up  . Medication Refill    Subjective:  "I am really upset about not knowing who the replacement doctor will be.  Dr Lolly Mustache had refused to treat me in the past and if he is assigned to come back here, then I am not going to have a doctor unless he takes me back.".  Depression 10/10 and Anxiety 7/10, where 1 is the best and 10 is the worst. Pain is 0/10 .  Pt comes for her follow-up appointment. Her daughter comes to the appointment today. Pt reports that she is compliant with the psychotropic medications with poor benefit and some side effects. She has stopped the Lamictal and is only taking two Wellbutrin.   LIFE STYLE CHANGE: She is still doing a little light exercise and dancing.  It has been awhile since she had her daughter join her in this.  They might go fishing some tomorrow.  Vitals: BP 123/76  Pulse 86  Ht 5\' 5"  (1.651 m)  Wt 203 lb 6.4 oz (92.262 kg)  BMI 33.85 kg/m2  Allergies: Allergies  Allergen Reactions  . Neurontin (Gabapentin) Other (See Comments)    THIRTY lb wt gain  . Lamictal (Lamotrigine) Other (See Comments)    At any dose higher than 50 mg once a day experiences dizziness, panic, and headaches.   . Trazodone And Nefazodone     Caused insomnia   Medical History: Past Medical History  Diagnosis Date  . Hypertension   . Diabetes mellitus   . Arthritis   . Depression   . Anxiety   . Headache(784.0)   . History of borderline personality disorder   . Personality disorder   . Hyperlipidemia    Surgical History: Past Surgical History  Procedure Laterality Date  . Foot surgery    . Total abdominal hysterectomy w/ bilateral salpingoophorectomy     Family History: family history includes  Depression in her mother; OCD in her other; and Ovarian cancer in her sister.  There is no history of ADD / ADHD, and Alcohol abuse, and Drug abuse, and Anxiety disorder, and Bipolar disorder, and Dementia, and Paranoid behavior, and Schizophrenia, and Seizures, and Sexual abuse, and Physical abuse, . Reviewed again in today's visit and nothing new was added.  Current Medications: Current outpatient prescriptions:buPROPion (WELLBUTRIN) 75 MG tablet, Take 3 tablets (225 mg total) by mouth daily., Disp: 90 tablet, Rfl: 0;  clonazePAM (KLONOPIN) 0.5 MG tablet, Take 1 tablet (0.5 mg total) by mouth 3 (three) times daily as needed., Disp: 90 tablet, Rfl: 0;  venlafaxine XR (EFFEXOR-XR) 150 MG 24 hr capsule, Take 1 capsule (150 mg total) by mouth every morning., Disp: , Rfl:  acetaminophen (TYLENOL) 500 MG tablet, Take 1,000 mg by mouth every 6 (six) hours as needed for pain., Disp: , Rfl: ;  estradiol (ESTRACE) 2 MG tablet, Take 2 mg by mouth daily., Disp: , Rfl: ;  furosemide (LASIX) 20 MG tablet, Take 20 mg by mouth daily. For lower extremity swellings., Disp: , Rfl:  lamoTRIgine (LAMICTAL) 25 MG tablet, Take 2 tablets (50 mg total) by mouth daily. For mood stabilization, any higher dose causes panic, migraines, and nausea, Disp: 60 tablet, Rfl: 1;  lisinopril (PRINIVIL,ZESTRIL)  20 MG tablet, Take 1 tablet (20 mg total) by mouth daily. For control of high blood pressure, Disp: , Rfl: ;  medroxyPROGESTERone (PROVERA) 5 MG tablet, Take 0.5 tablets (2.5 mg total) by mouth every morning., Disp: , Rfl:  metFORMIN (GLUCOPHAGE) 500 MG tablet, Take 1 tablet (500 mg total) by mouth 2 (two) times daily with a meal. For diabetes control, Disp: , Rfl: ;  metoprolol tartrate (LOPRESSOR) 25 MG tablet, Take 1 tablet (25 mg total) by mouth 2 (two) times daily. For hypertension, Disp: 60 tablet, Rfl: 0;  [DISCONTINUED] FLUoxetine (PROZAC) 40 MG capsule, Take 40 mg by mouth daily.  , Disp: , Rfl:   Diagnosis:  Axis I: Major  Depression, Recurrent severe, Panic Disorder and Obsessive Compulsive Disorder  Axis II: Borderline Personality Dis.  Axis III:  Past Medical History   Diagnosis  Date   .  Hypertension    .  Diabetes mellitus    .  Arthritis    .  Depression    .  Anxiety    .  Headache    .  History of borderline personality disorder    .  Personality disorder    Axis IV: other psychosocial or environmental problems  Axis V: 51-60 moderate symptoms  ADL's: Intact  Sleep: Good  Appetite: Too good perhaps related to being back on Abilify  Psychiatric Specialty Exam:  Review of Systems  Constitutional: Negative.  Eyes: Negative.  Respiratory: Negative.  Cardiovascular: Negative.  Gastrointestinal: Negative.  Genitourinary: Negative.  Musculoskeletal: Negative.  Skin: Negative.  Neurological: Positive for headaches.  Endo/Heme/Allergies: Negative.  Psychiatric/Behavioral: Positive for depression. [name].   Vitals: BP 123/76  Pulse 86  Ht 5\' 5"  (1.651 m)  Wt 203 lb 6.4 oz (92.262 kg)  BMI 33.85 kg/m2 Down 1 pound from last visit.  General Appearance: Casual   Eye Contact:: Fair   Speech: Normal Rate   Volume: Normal   Mood: Very distarught   Affect: Constricted   Thought Process: Coherent   Orientation: Full (Time, Place, and Person)   Thought Content: WDL   Suicidal Thoughts: No   Homicidal Thoughts: No   Memory: Immediate; Fair  Recent; Fair  Remote; Fair   Judgement: Fair   Insight: Fair   Psychomotor Activity: Normal   Concentration: Fair   Recall: Fair   Akathisia: No   Handed: Right   AIMS (if indicated):   Assets: Communication Skills  Desire for Improvement  Housing  Social Support   Sleep: 16 in the last 24 hours.   Current Medications:  Effexor XR 150 mg every AM  Lamictal 25 mg 2 every AM  Klonopin 0.5 mg BID-TID  Abilify 2 mg every bed time Wellbutrin 75 mg in AM  Lab Results:  Results for orders placed during the hospital  encounter of 05/23/13 (from the past 2016 hour(s))  RAPID STREP SCREEN   Collection Time    05/23/13  1:23 PM      Result Value Range   Streptococcus, Group A Screen (Direct) POSITIVE (*) NEGATIVE  Results for orders placed during the hospital encounter of 04/23/13 (from the past 2016 hour(s))  GLUCOSE, CAPILLARY   Collection Time    04/23/13  6:16 AM      Result Value Range   Glucose-Capillary 115 (*) 70 - 99 mg/dL  GLUCOSE, CAPILLARY   Collection Time    04/23/13 11:57 AM      Result Value Range   Glucose-Capillary 109 (*) 70 -  99 mg/dL  GLUCOSE, CAPILLARY   Collection Time    04/23/13  5:12 PM      Result Value Range   Glucose-Capillary 165 (*) 70 - 99 mg/dL  GLUCOSE, CAPILLARY   Collection Time    04/24/13  5:57 AM      Result Value Range   Glucose-Capillary 128 (*) 70 - 99 mg/dL  GLUCOSE, CAPILLARY   Collection Time    04/24/13 11:53 AM      Result Value Range   Glucose-Capillary 120 (*) 70 - 99 mg/dL   Comment 1 Documented in Chart     Comment 2 Notify RN    GLUCOSE, CAPILLARY   Collection Time    04/24/13  5:14 PM      Result Value Range   Glucose-Capillary 107 (*) 70 - 99 mg/dL  GLUCOSE, CAPILLARY   Collection Time    04/25/13  5:58 AM      Result Value Range   Glucose-Capillary 111 (*) 70 - 99 mg/dL  GLUCOSE, CAPILLARY   Collection Time    04/25/13  5:12 PM      Result Value Range   Glucose-Capillary 137 (*) 70 - 99 mg/dL   Comment 1 Documented in Chart     Comment 2 Notify RN    GLUCOSE, CAPILLARY   Collection Time    04/26/13  5:52 AM      Result Value Range   Glucose-Capillary 122 (*) 70 - 99 mg/dL  Results for orders placed during the hospital encounter of 04/22/13 (from the past 2016 hour(s))  URINE CULTURE   Collection Time    04/22/13  5:04 PM      Result Value Range   Specimen Description URINE, CLEAN CATCH     Special Requests NONE     Culture  Setup Time 04/23/2013 03:27     Colony Count >=100,000 COLONIES/ML     Culture ESCHERICHIA  COLI     Report Status 04/25/2013 FINAL     Organism ID, Bacteria ESCHERICHIA COLI    URINE RAPID DRUG SCREEN (HOSP PERFORMED)   Collection Time    04/22/13  5:04 PM      Result Value Range   Opiates NONE DETECTED  NONE DETECTED   Cocaine NONE DETECTED  NONE DETECTED   Benzodiazepines POSITIVE (*) NONE DETECTED   Amphetamines NONE DETECTED  NONE DETECTED   Tetrahydrocannabinol NONE DETECTED  NONE DETECTED   Barbiturates NONE DETECTED  NONE DETECTED  URINALYSIS, ROUTINE W REFLEX MICROSCOPIC   Collection Time    04/22/13  5:04 PM      Result Value Range   Color, Urine YELLOW  YELLOW   APPearance HAZY (*) CLEAR   Specific Gravity, Urine >1.030 (*) 1.005 - 1.030   pH 5.5  5.0 - 8.0   Glucose, UA NEGATIVE  NEGATIVE mg/dL   Hgb urine dipstick NEGATIVE  NEGATIVE   Bilirubin Urine NEGATIVE  NEGATIVE   Ketones, ur NEGATIVE  NEGATIVE mg/dL   Protein, ur NEGATIVE  NEGATIVE mg/dL   Urobilinogen, UA 0.2  0.0 - 1.0 mg/dL   Nitrite NEGATIVE  NEGATIVE   Leukocytes, UA SMALL (*) NEGATIVE  URINE MICROSCOPIC-ADD ON   Collection Time    04/22/13  5:04 PM      Result Value Range   Squamous Epithelial / LPF FEW (*) RARE   WBC, UA 7-10  <3 WBC/hpf   Bacteria, UA FEW (*) RARE  CBC WITH DIFFERENTIAL   Collection Time    04/22/13  5:14 PM      Result Value Range   WBC 9.5  4.0 - 10.5 K/uL   RBC 5.20 (*) 3.87 - 5.11 MIL/uL   Hemoglobin 13.7  12.0 - 15.0 g/dL   HCT 04.5  40.9 - 81.1 %   MCV 77.9 (*) 78.0 - 100.0 fL   MCH 26.3  26.0 - 34.0 pg   MCHC 33.8  30.0 - 36.0 g/dL   RDW 91.4  78.2 - 95.6 %   Platelets 207  150 - 400 K/uL   Neutrophils Relative % 62  43 - 77 %   Neutro Abs 5.9  1.7 - 7.7 K/uL   Lymphocytes Relative 30  12 - 46 %   Lymphs Abs 2.9  0.7 - 4.0 K/uL   Monocytes Relative 5  3 - 12 %   Monocytes Absolute 0.5  0.1 - 1.0 K/uL   Eosinophils Relative 2  0 - 5 %   Eosinophils Absolute 0.2  0.0 - 0.7 K/uL   Basophils Relative 0  0 - 1 %   Basophils Absolute 0.0  0.0 - 0.1 K/uL   COMPREHENSIVE METABOLIC PANEL   Collection Time    04/22/13  5:14 PM      Result Value Range   Sodium 140  135 - 145 mEq/L   Potassium 3.8  3.5 - 5.1 mEq/L   Chloride 100  96 - 112 mEq/L   CO2 26  19 - 32 mEq/L   Glucose, Bld 93  70 - 99 mg/dL   BUN 13  6 - 23 mg/dL   Creatinine, Ser 2.13  0.50 - 1.10 mg/dL   Calcium 9.8  8.4 - 08.6 mg/dL   Total Protein 7.2  6.0 - 8.3 g/dL   Albumin 3.7  3.5 - 5.2 g/dL   AST 44 (*) 0 - 37 U/L   ALT 29  0 - 35 U/L   Alkaline Phosphatase 103  39 - 117 U/L   Total Bilirubin 0.2 (*) 0.3 - 1.2 mg/dL   GFR calc non Af Amer >90  >90 mL/min   GFR calc Af Amer >90  >90 mL/min  ETHANOL   Collection Time    04/22/13  5:14 PM      Result Value Range   Alcohol, Ethyl (B) <11  0 - 11 mg/dL  Results for orders placed in visit on 03/23/13 (from the past 2016 hour(s))  T3, FREE   Collection Time    03/23/13 12:13 PM      Result Value Range   T3, Free 2.8  2.3 - 4.2 pg/mL  T4, FREE   Collection Time    03/23/13 12:13 PM      Result Value Range   Free T4 1.36  0.80 - 1.80 ng/dL   Physical Findings:  AIMS: , , , ,  CIWA:  COWS:  Treatment Plan Summary:  Medication management   Plan/Discussion:  I took her vitals. I reviewed CC, tobacco/med/surg Hx, meds effects/ side effects, problem list, therapies and responses as well as current situation/symptoms discussed options.  Try off Abilify as it may be causing side effects and not working very well.  Taper off Lamictal and see how that goes, then see back to see how that is going. See orders and pt instructions for more details.   MEDICATIONS this encounter: No orders of the defined types were placed in this encounter.   Medical Decision Making Problem Points:  Established problem, worsening (2), Review of last therapy  session (1) and Review of psycho-social stressors (1) Data Points:  Review or order clinical lab tests (1) Review of medication regiment & side effects (2) Review of new medications  or change in dosage (2)  I certify that outpatient services furnished can reasonably be expected to improve the patient's condition.   Orson Aloe, MD, [facility]

## 2013-06-15 NOTE — Patient Instructions (Addendum)
Explore the best dose of Wellbutrin for your mood.  Have fun fishing  Call Arnoldo Morale at [phone] on progress as to who will be the new doctor at [facility] and if it is Dr Lolly Mustache, then request a meeting with him to negotiate how you can work together.  Call if problems or concerns.

## 2013-07-14 ENCOUNTER — Encounter (HOSPITAL_COMMUNITY): Payer: Self-pay | Admitting: Rehabilitation

## 2013-07-14 ENCOUNTER — Inpatient Hospital Stay (HOSPITAL_COMMUNITY)
Admission: RE | Admit: 2013-07-14 | Discharge: 2013-07-19 | DRG: 885 | Disposition: A | Discharge status: Home / Self Care | Payer: Medicare Other | Attending: Psychiatry | Admitting: Psychiatry

## 2013-07-14 ENCOUNTER — Ambulatory Visit (INDEPENDENT_AMBULATORY_CARE_PROVIDER_SITE_OTHER): Payer: Medicare Other | Admitting: Psychology

## 2013-07-14 ENCOUNTER — Telehealth (HOSPITAL_COMMUNITY): Payer: Self-pay | Admitting: Licensed Clinical Social Worker

## 2013-07-14 ENCOUNTER — Encounter (HOSPITAL_COMMUNITY): Payer: Self-pay | Admitting: Psychology

## 2013-07-14 DIAGNOSIS — F603 Borderline personality disorder: Secondary | ICD-10-CM | POA: Diagnosis present

## 2013-07-14 DIAGNOSIS — F429 Obsessive-compulsive disorder, unspecified: Secondary | ICD-10-CM

## 2013-07-14 DIAGNOSIS — Z79899 Other long term (current) drug therapy: Secondary | ICD-10-CM | POA: Diagnosis not present

## 2013-07-14 DIAGNOSIS — F410 Panic disorder [episodic paroxysmal anxiety] without agoraphobia: Secondary | ICD-10-CM

## 2013-07-14 DIAGNOSIS — F339 Major depressive disorder, recurrent, unspecified: Secondary | ICD-10-CM

## 2013-07-14 DIAGNOSIS — F1320 Sedative, hypnotic or anxiolytic dependence, uncomplicated: Secondary | ICD-10-CM

## 2013-07-14 DIAGNOSIS — E119 Type 2 diabetes mellitus without complications: Secondary | ICD-10-CM | POA: Diagnosis present

## 2013-07-14 DIAGNOSIS — R45851 Suicidal ideations: Secondary | ICD-10-CM

## 2013-07-14 DIAGNOSIS — F332 Major depressive disorder, recurrent severe without psychotic features: Principal | ICD-10-CM | POA: Diagnosis present

## 2013-07-14 DIAGNOSIS — F0781 Postconcussional syndrome: Secondary | ICD-10-CM

## 2013-07-14 DIAGNOSIS — F331 Major depressive disorder, recurrent, moderate: Secondary | ICD-10-CM | POA: Diagnosis not present

## 2013-07-14 DIAGNOSIS — F411 Generalized anxiety disorder: Secondary | ICD-10-CM | POA: Diagnosis not present

## 2013-07-14 DIAGNOSIS — I10 Essential (primary) hypertension: Secondary | ICD-10-CM | POA: Diagnosis present

## 2013-07-14 DIAGNOSIS — F1994 Other psychoactive substance use, unspecified with psychoactive substance-induced mood disorder: Secondary | ICD-10-CM

## 2013-07-14 MED ORDER — CLONAZEPAM 0.5 MG PO TABS
0.5000 mg | ORAL_TABLET | Freq: Three times a day (TID) | ORAL | Status: DC | PRN
  Administered 2013-07-14 – 2013-07-19 (×11): 0.5 mg via ORAL
  Filled 2013-07-14 (×11): qty 1

## 2013-07-14 MED ORDER — METFORMIN HCL 500 MG PO TABS
500.0000 mg | ORAL_TABLET | Freq: Two times a day (BID) | ORAL | Status: DC
  Administered 2013-07-15 – 2013-07-19 (×9): 500 mg via ORAL
  Filled 2013-07-14 (×14): qty 1

## 2013-07-14 MED ORDER — METFORMIN HCL 500 MG PO TABS
1000.0000 mg | ORAL_TABLET | Freq: Every day | ORAL | Status: DC
  Filled 2013-07-14: qty 2

## 2013-07-14 MED ORDER — METOPROLOL TARTRATE 25 MG PO TABS
25.0000 mg | ORAL_TABLET | Freq: Two times a day (BID) | ORAL | Status: DC
  Administered 2013-07-14 – 2013-07-19 (×10): 25 mg via ORAL
  Filled 2013-07-14 (×16): qty 1

## 2013-07-14 MED ORDER — MEDROXYPROGESTERONE ACETATE 2.5 MG PO TABS
2.5000 mg | ORAL_TABLET | Freq: Every day | ORAL | Status: DC
  Administered 2013-07-15 – 2013-07-19 (×5): 2.5 mg via ORAL
  Filled 2013-07-14 (×8): qty 1

## 2013-07-14 MED ORDER — ESTRADIOL 2 MG PO TABS
2.0000 mg | ORAL_TABLET | Freq: Every day | ORAL | Status: DC
  Administered 2013-07-15 – 2013-07-19 (×5): 2 mg via ORAL
  Filled 2013-07-14 (×8): qty 1

## 2013-07-14 MED ORDER — VENLAFAXINE HCL ER 150 MG PO CP24
150.0000 mg | ORAL_CAPSULE | Freq: Every day | ORAL | Status: DC
  Administered 2013-07-15 – 2013-07-19 (×5): 150 mg via ORAL
  Filled 2013-07-14 (×6): qty 1
  Filled 2013-07-14: qty 3
  Filled 2013-07-14 (×2): qty 1

## 2013-07-14 MED ORDER — ACETAMINOPHEN 325 MG PO TABS: 650.0000 mg | ORAL_TABLET | Freq: Four times a day (QID) | ORAL | Status: DC | PRN

## 2013-07-14 MED ORDER — LISINOPRIL 20 MG PO TABS
20.0000 mg | ORAL_TABLET | Freq: Every day | ORAL | Status: DC
  Administered 2013-07-15 – 2013-07-19 (×5): 20 mg via ORAL
  Filled 2013-07-14 (×9): qty 1

## 2013-07-14 MED ORDER — ALUM & MAG HYDROXIDE-SIMETH 200-200-20 MG/5ML PO SUSP: 30.0000 mL | ORAL | Status: DC | PRN

## 2013-07-14 MED ORDER — MAGNESIUM HYDROXIDE 400 MG/5ML PO SUSP: 30.0000 mL | Freq: Every day | ORAL | Status: DC | PRN

## 2013-07-14 NOTE — Progress Notes (Signed)
Travis is a 47 year old patient admitted today with depression and suicidal ideation.  She states that she had a plan to OD on meds, but states that she also has "random" suicidal thoughts that occur to her.  She currently denies SI/HI/AVH.  She appears flat and states that she has been having depression for a number of years.
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
## 2013-07-14 NOTE — Progress Notes (Signed)
Adult Psychoeducational Group Note  Date:  07/14/2013 Time:  8:00PM Group Topic/Focus:  Wrap-Up Group:   The focus of this group is to help patients review their daily goal of treatment and discuss progress on daily workbooks.  Participation Level:  Active  Participation Quality:  Appropriate and Attentive  Affect:  Appropriate  Cognitive:  Alert and Appropriate  Insight: Appropriate  Engagement in Group:  Engaged  Modes of Intervention:  Discussion  Additional Comments:  Pt. Was attentive and appropriate during tonight's group discussion. Pt. Stated that she need help with being med compliant and getting a better understanding of depression and anxiety. Pt. Stated that she is willing to learn how to deal with mental illness.   Bing Plume D 07/14/2013, 9:45 PM

## 2013-07-14 NOTE — Progress Notes (Signed)
The patient came in today for our initial appointment but reported when she first came in that she was feeling severely depressed with some suicidal ideation but no plan. She reports that she had been staying in bed much of the time for the past couple of weeks. She reports that she stopped all medications except for her Klonopin and Effexor. She reports that she felt like she needed to go into the hospital. We did not have a full appointment today and the patient contacted her sister about taking her down to [facility] today for admission. The patient does appear to be severely depressed but does contract for safety and reports that there is no immediate danger that she would harm herself. She did have her daughter with her physically today and she is contacted her sister about making arrangements to take care of her daughter and transportation. The patient was going to leave with her sister to go to [facility] concerning inpatient hospitalization.

## 2013-07-14 NOTE — Progress Notes (Signed)
Initial Interdisciplinary Treatment Plan  PATIENT STRENGTHS: (choose at least two) Capable of independent living Motivation for treatment/growth  PATIENT STRESSORS: Medication change or noncompliance   PROBLEM LIST: Problem List/Patient Goals Date to be addressed Date deferred Reason deferred Estimated date of resolution  Depression      Suicidal Ideation                                                 DISCHARGE CRITERIA:  Ability to meet basic life and health needs Improved stabilization in mood, thinking, and/or behavior  PRELIMINARY DISCHARGE PLAN: Attend aftercare/continuing care group Return to previous living arrangement  PATIENT/FAMIILY INVOLVEMENT: This treatment plan has been presented to and reviewed with the patient, [name].  The patient and family have been given the opportunity to ask questions and make suggestions.  Angela Adam 07/14/2013, 7:26 PM

## 2013-07-15 ENCOUNTER — Encounter (HOSPITAL_COMMUNITY): Payer: Self-pay | Admitting: Psychiatry

## 2013-07-15 LAB — GLUCOSE, CAPILLARY
Glucose-Capillary: 105 mg/dL — ABNORMAL HIGH (ref 70–99)
Glucose-Capillary: 130 mg/dL — ABNORMAL HIGH (ref 70–99)

## 2013-07-15 MED ORDER — HYDROXYZINE HCL 50 MG PO TABS
50.0000 mg | ORAL_TABLET | Freq: Every evening | ORAL | Status: DC | PRN
  Administered 2013-07-16: 50 mg via ORAL

## 2013-07-15 MED ORDER — LURASIDONE HCL 40 MG PO TABS
40.0000 mg | ORAL_TABLET | Freq: Every day | ORAL | Status: DC
  Administered 2013-07-16 – 2013-07-17 (×2): 40 mg via ORAL
  Filled 2013-07-15 (×5): qty 1

## 2013-07-15 NOTE — Progress Notes (Signed)
D: Patient appropriate and cooperative with staff and peers. Patient's affect/mood is anxious. Patient complained of anxiety 8/10. She reported on the self inventory sheet that her sleep is fair, appetite/ability to pay attention is good and energy level is low. Patient rated depression "8" and feelings of hopelessness "10". Participating in groups and compliant with medication regimen.  A: Support and encouragement provided to patient. Administered scheduled medications per ordering MD. Monitor Q15 minute checks for safety.  R: Patient receptive. Denies SI/HI/AVH. Patient remains safe on the unit.

## 2013-07-15 NOTE — Progress Notes (Signed)
Patient ID: TAMARRA GEISELMAN, female   DOB: 09-Sep-1966, 47 y.o.   MRN: [id number]  D:  Pt endorses passive SI, but contracts for safety. Pt denies HI/AVH/ pain at this time. Pt is pleasant and cooperative.  Pt states she stayed in bed most of the day and isolated herself so she would keep from being anxious. Pt states she will be more active on the unit tomorrow.  A: Pt was offered support and encouragement. Pt was given scheduled medications. Pt was encourage to attend groups. Q 15 minute checks were done for safety.   R:Pt attended karaoke  and interacts well with peers and staff. Pt is taking medication. Pt has no complaints at this time.Pt receptive to treatment and safety maintained on unit.

## 2013-07-15 NOTE — Progress Notes (Signed)
Patient ID: [name], female   DOB: 25-Jan-1966, 47 y.o.   MRN: [id number] Pt did not attend 10 and 11 am Psychoeducational group.

## 2013-07-15 NOTE — BHH Group Notes (Signed)
[facility] LCSW Aftercare Discharge Planning Group Note   07/15/2013 8:37 AM  Participation Quality:  Approrpriate  Mood/Affect:  Appropriate and Depressed  Depression Rating:  10  Anxiety Rating:  10  Thoughts of Suicide:  Yes  Will you contract for safety?   Yes  Current AVH:  No  Plan for Discharge/Comments:  Patient attending discharge planning group and actively participated in group.  She advised of increased anxiety and depression and believes stressors to be a new relationship and daughter being out of school for the summer.  She is followed outpatient by [facility].  SW provided all participants with daily workbook.   Transportation Means: Patient has transportation.   Supports: Patient has limited support system.   Caesar Mannella, Joesph July

## 2013-07-15 NOTE — BHH Suicide Risk Assessment (Signed)
Suicide Risk Assessment  Admission Assessment     Nursing information obtained from:    Demographic factors:    Current Mental Status:    Loss Factors:    Historical Factors:    Risk Reduction Factors:     CLINICAL FACTORS:   Depression:   Anhedonia Hopelessness Impulsivity Severe  COGNITIVE FEATURES THAT CONTRIBUTE TO RISK:  Closed-mindedness Polarized thinking Thought constriction (tunnel vision)    SUICIDE RISK:   Moderate:  Frequent suicidal ideation with limited intensity, and duration, some specificity in terms of plans, no associated intent, good self-control, limited dysphoria/symptomatology, some risk factors present, and identifiable protective factors, including available and accessible social support.  PLAN OF CARE: Supportive approach/coping skills                               Reassess, optimize response from psychotropics  I certify that inpatient services furnished can reasonably be expected to improve the patient's condition.  Avien Taha A 07/15/2013, 3:14 PM

## 2013-07-15 NOTE — Progress Notes (Signed)
Pt attended Karaoke group and participated by singing a song.  

## 2013-07-15 NOTE — BHH Counselor (Signed)
Adult Psychosocial Assessment Update Interdisciplinary Team  Previous [facility] admissions/discharges:  Admissions Discharges  Date:  04/23/13 Date:04/26/13  Date: Date:  Date: Date:  Date: Date:  Date: Date:   Changes since the last Psychosocial Assessment (including adherence to outpatient mental health and/or substance abuse treatment, situational issues contributing to decompensation and/or relapse). Patient reports becoming increasingly depression and suicidal.  She stated stressors are new relationship that started in December 2013 and daughter being out of school for the summer.             Discharge Plan 1. Will you be returning to the same living situation after discharge?   Yes: No:      If no, what is your plan?    Yes, patient will return to her home at discharge.       2. Would you like a referral for services when you are discharged? Yes:     If yes, for what services?  No:       Yes,  Patient will continue to see therapist at [facility] but will come to [facility] Outpatient to be seen by MD while awaiting new MD's arrival at [facility] office.       Summary and Recommendations (to be completed by the evaluator) [name] is a 47 years old Caucasian female admitted with Major Depression Disorder.  She will benefit from crisis stabilization, evaluation for medication, psycho-education groups for coping skills development, group therapy and case management for discharge planning.                        Signature:  Wynn Banker, 07/15/2013 11:37 AM

## 2013-07-15 NOTE — BHH Group Notes (Signed)
B[facility] LCSW Group Therapy  Living a Balanced Life 1:15 - 2:30 PM  07/15/2013  3:44 PM    Type of Therapy:  Group Therapy  Participation Level:  Active  Participation Quality:  Attentive  Affect:  Appropriate  Cognitive:  Appropriate  Insight:  Developing/Improving and Engaged  Engagement in Therapy:  Developing/Improving Engaged  Modes of Intervention:  Discussion, Education, Exploration, Problem-Solving, Rapport Building, Support   Summary of Progress/Problems:  Patient shared she was like one of the other patients in that she spends a lot of time sleeping.  She shared she understands it is not fair to her daughter but daughter has learned to adapt to her illness.  Patient shared she does pushed herself to get up and out with daughter.  Wynn Banker 07/15/2013 3:44 PM
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
## 2013-07-16 DIAGNOSIS — F332 Major depressive disorder, recurrent severe without psychotic features: Principal | ICD-10-CM

## 2013-07-16 DIAGNOSIS — F411 Generalized anxiety disorder: Secondary | ICD-10-CM

## 2013-07-16 LAB — GLUCOSE, CAPILLARY: Glucose-Capillary: 106 mg/dL — ABNORMAL HIGH (ref 70–99)

## 2013-07-16 NOTE — Tx Team (Signed)
Interdisciplinary Treatment Plan Update   Date Reviewed:  07/16/2013  Time Reviewed:  8:42 AM  Progress in Treatment:   Attending groups: Yes Participating in groups: Yes Taking medication as prescribed: Yes  Tolerating medication: Yes Family/Significant other contact made: Yes  Patient understands diagnosis: Yes  Discussing patient identified problems/goals with staff: Yes Medical problems stabilized or resolved: Yes Denies suicidal/homicidal ideation: Yes Patient has not harmed self or others: Yes  For review of initial/current patient goals, please see plan of care.  Estimated Length of Stay:  3-4 days  Reasons for Continued Hospitalization:  Anxiety Depression Medication stabilization Suicidal ideation  New Problems/Goals identified:    Discharge Plan or Barriers:   Home with outpatient follow up with [facility]  Additional Comments: N/A  Attendees:  Patient:  07/16/2013 8:42 AM   Signature:  07/16/2013 8:42 AM  Signature: 07/16/2013 8:42 AM  Signature: Harold Barban, RN 07/16/2013 8:42 AM  Signature: 07/16/2013 8:42 AM  Signature:  07/16/2013 8:42 AM  Signature:  Juline Patch, LCSW 07/16/2013 8:42 AM  Signature:  Reyes Ivan, LCSW 07/16/2013 8:42 AM  Signature:  Sharin Grave Coordinator 07/16/2013 8:42 AM  Signature: Fransisca Kaufmann, [facility] 07/16/2013 8:42 AM  Signature:    Signature:    Signature:      Scribe for Treatment Team:   Juline Patch,  07/16/2013 8:42 AM

## 2013-07-16 NOTE — Progress Notes (Signed)
Psychoeducational Group Note  Date:  07/16/2013 Time:  1000  Group Topic/Focus:  Therapeutic Activity  Participation Level: Did Not Attend  Participation Quality:  Not Applicable  Affect:  Not Applicable  Cognitive:  Not Applicable  Insight:  Not Applicable  Engagement in Group: Not Applicable  Additional Comments:  Patient did not attend, patient remained in bed.  Karleen Hampshire Brittini 07/16/2013, 5:31 PM

## 2013-07-16 NOTE — Progress Notes (Signed)
Adult Psychoeducational Group Note  Date:  07/16/2013 Time:  3:39 PM  Group Topic/Focus:  Relapse Prevention Planning:   The focus of this group is to define relapse and discuss the need for planning to combat relapse.  Participation Level:  Did Not Attend  Participation Quality:  did not attend  Affect:  Did not attend  Cognitive:  did not attend  Insight: None  Engagement in Group:  None  Modes of Intervention:  Education  Additional Comments:     Reynolds Bowl 07/16/2013, 3:39 PM

## 2013-07-16 NOTE — Progress Notes (Signed)
Patient ID: [name], female   DOB: 1966/10/26, 47 y.o.   MRN: [id number] PER STATE REGULATIONS 482.30  THIS CHART WAS REVIEWED FOR MEDICAL NECESSITY WITH RESPECT TO THE PATIENT'S ADMISSION/ DURATION OF STAY.  NEXT REVIEW DATE: 07/19/2013  Willa Rough, RN, BSN CASE MANAGER

## 2013-07-16 NOTE — [facility] Group Notes (Signed)
[facility] LCSW Aftercare Discharge Planning Group Note   07/16/2013 12:44 PM  Participation Quality:  Approrpriate  Mood/Affect:  Appropriate and Depressed  Depression Rating:  9  Anxiety Rating:  7  Thoughts of Suicide:  Yes  Will you contract for safety?   Yes  Current AVH:  No  Plan for Discharge/Comments:  Patient attending discharge planning group and actively participated in group. She reports being tiered today.Marland Kitchen  She was informed Clinical research associate was able to get her scheduled with MD at [facility] while awaiting new MD's arrival in [place].  Patient agreeable to follow up as discussed with her on 7/17..  SW provided all participants with daily workbook.   Transportation Means: Patient has transportation.   Supports: Patient has limited support system.   [name], Joesph July

## 2013-07-16 NOTE — Progress Notes (Signed)
D: Patient's affect/mood is flat and depressed; she verbalized that she's really sad today and did not rest well last night. Patient complained of anxiety 8/10. She reported on the self inventory sheet that her sleep is poor, appetite is good, energy level is low and ability to pay attention is poor. Patient rated depression and feelings of hopelessness "8". She's going to groups/meals and compliant with current medication regimen.  A: Support and encouragement provided to patient. Scheduled medications administered per MD orders. Maintain Q15 minute checks for safety.  R: Patient receptive. Denies SI/HI/AVH at this time. Patient remains safe.

## 2013-07-16 NOTE — [facility] Group Notes (Signed)
[facility] LCSW Group Therapy  Feelings Around Relapse 1:15 -2:30        07/16/2013  2:50 PM   Type of Therapy:  Group Therapy  Participation Level:  Patient left group early without participating.   Wynn Banker 07/16/2013 2:50 PM

## 2013-07-16 NOTE — [facility] Suicide Risk Assessment (Signed)
[facility] INPATIENT:  Family/Significant Other Suicide Prevention Education  Suicide Prevention Education:  Education Completed; [name], Mother, [phone]; has been identified by the patient as the family member/significant other with whom the patient will be residing, and identified as the person(s) who will aid the patient in the event of a mental health crisis (suicidal ideations/suicide attempt).  With written consent from the patient, the family member/significant other has been provided the following suicide prevention education, prior to the and/or following the discharge of the patient.  The suicide prevention education provided includes the following:  Suicide risk factors  Suicide prevention and interventions  National Suicide Hotline telephone number  [facility] assessment telephone number  [facility] Emergency Assistance 911  [facility] and/or Residential Mobile Crisis Unit telephone number  Request made of family/significant other to:  Remove weapons (e.g., guns, rifles, knives), all items previously/currently identified as safety concern.  Mother reports patient does not have access to guns.  Remove drugs/medications (over-the-counter, prescriptions, illicit drugs), all items previously/currently identified as a safety concern.  The family member/significant other verbalizes understanding of the suicide prevention education information provided.  The family member/significant other agrees to remove the items of safety concern listed above.  [name] 07/16/2013, 2:59 PM

## 2013-07-16 NOTE — Progress Notes (Signed)
[facility] MD Progress Note  07/16/2013 2:09 PM TARNESHA ULLOA  MRN:  [id number] Subjective:   Patient observed resting in bed this morning during group time. She was encouraged to get up and attend groups. Patient complains of not sleeping well at night. Rates her depression at eight. Endorses passive SI with a plan to overdose. She feels hopeless that her depression will improve. Patient inquires how long will it take the Latuda to start working.   Diagnosis:   Axis I: Generalized Anxiety Disorder and Major Depression, Recurrent severe Axis II: Deferred Axis III:  Past Medical History  Diagnosis Date  . Hypertension   . Diabetes mellitus   . Arthritis   . Depression   . Anxiety   . Headache(784.0)   . History of borderline personality disorder   . Personality disorder   . Hyperlipidemia    Axis IV: other psychosocial or environmental problems, problems related to social environment, problems with access to health care services and problems with primary support group Axis V: 41-50 serious symptoms  ADL's:  Intact  Sleep: Fair  Appetite:  Good  Suicidal Ideation:  Passive SI to overdose Homicidal Ideation:  Denies AEB (as evidenced by):  Psychiatric Specialty Exam: Review of Systems  Constitutional: Positive for malaise/fatigue.  HENT: Negative.   Eyes: Negative.   Respiratory: Negative.   Cardiovascular: Negative.   Gastrointestinal: Negative.   Musculoskeletal: Negative.   Skin: Negative.   Neurological: Negative.   Endo/Heme/Allergies: Negative.   Psychiatric/Behavioral: Positive for depression and suicidal ideas. Negative for hallucinations, memory loss and substance abuse. [name] and has insomnia.     Blood pressure 118/79, pulse 96, temperature 97.5 F (36.4 C), temperature source Oral, resp. rate 16, height 5\' 5"  (1.651 m), weight 95.255 kg (210 lb).Body mass index is 34.95 kg/(m^2).  General Appearance: Casual and Fairly Groomed  Proofreader::  Fair  Speech:  Slow  Volume:  Decreased  Mood:  Depressed  Affect:  Blunt  Thought Process:  Coherent  Orientation:  Full (Time, Place, and Person)  Thought Content:  Rumination  Suicidal Thoughts:  Yes.  with intent/plan  Homicidal Thoughts:  No  Memory:  Immediate;   Good Recent;   Good Remote;   Good  Judgement:  Fair  Insight:  Shallow  Psychomotor Activity:  Decreased  Concentration:  Fair  Recall:  Good  Akathisia:  No  Handed:  Right  AIMS (if indicated):     Assets:  Communication Skills Desire for Improvement Housing Intimacy Leisure Time Physical Health Resilience Social Support  Sleep:  Number of Hours: 4.25   Current Medications: Current Facility-Administered Medications  Medication Dose Route Frequency Provider Last Rate Last Dose  . acetaminophen (TYLENOL) tablet 650 mg  650 mg Oral Q6H PRN Nelly Rout, MD      . alum & mag hydroxide-simeth (MAALOX/MYLANTA) 200-200-20 MG/5ML suspension 30 mL  30 mL Oral Q4H PRN Nelly Rout, MD      . clonazePAM Scarlette Calico) tablet 0.5 mg  0.5 mg Oral TID PRN Nelly Rout, MD   0.5 mg at 07/16/13 0826  . estradiol (ESTRACE) tablet 2 mg  2 mg Oral Daily Nelly Rout, MD   2 mg at 07/16/13 1324  . hydrOXYzine (ATARAX/VISTARIL) tablet 50 mg  50 mg Oral QHS PRN,MR X 1 Fransisca Kaufmann, NP      . lisinopril (PRINIVIL,ZESTRIL) tablet 20 mg  20 mg Oral Daily Nelly Rout, MD   20 mg at 07/16/13 4010  .  lurasidone (LATUDA) tablet 40 mg  40 mg Oral Q breakfast Rachael Fee, MD   40 mg at 07/16/13 7846  . magnesium hydroxide (MILK OF MAGNESIA) suspension 30 mL  30 mL Oral Daily PRN Nelly Rout, MD      . medroxyPROGESTERone (PROVERA) tablet 2.5 mg  2.5 mg Oral Daily Nelly Rout, MD   2.5 mg at 07/16/13 0823  . metFORMIN (GLUCOPHAGE) tablet 500 mg  500 mg Oral BID WC Kerry Hough, PA-C   500 mg at 07/16/13 9629  . metoprolol tartrate (LOPRESSOR) tablet 25 mg  25 mg Oral BID Nelly Rout, MD   25 mg at 07/16/13 0823   . venlafaxine XR (EFFEXOR-XR) 24 hr capsule 150 mg  150 mg Oral Q breakfast Nelly Rout, MD   150 mg at 07/16/13 5284    Lab Results:  Results for orders placed during the hospital encounter of 07/14/13 (from the past 48 hour(s))  GLUCOSE, CAPILLARY     Status: Abnormal   Collection Time    07/15/13  6:22 AM      Result Value Range   Glucose-Capillary 105 (*) 70 - 99 mg/dL  GLUCOSE, CAPILLARY     Status: Abnormal   Collection Time    07/15/13  5:16 PM      Result Value Range   Glucose-Capillary 130 (*) 70 - 99 mg/dL   Comment 1 Notify RN     Comment 2 Documented in Chart    GLUCOSE, CAPILLARY     Status: Abnormal   Collection Time    07/16/13  6:55 AM      Result Value Range   Glucose-Capillary 106 (*) 70 - 99 mg/dL   Comment 1 Notify RN      Physical Findings: AIMS: Facial and Oral Movements Muscles of Facial Expression: None, normal Lips and Perioral Area: None, normal Jaw: None, normal Tongue: None, normal,Extremity Movements Upper (arms, wrists, hands, fingers): None, normal Lower (legs, knees, ankles, toes): None, normal, Trunk Movements Neck, shoulders, hips: None, normal, Overall Severity Severity of abnormal movements (highest score from questions above): None, normal Incapacitation due to abnormal movements: None, normal Patient's awareness of abnormal movements (rate only patient's report): No Awareness, Dental Status Current problems with teeth and/or dentures?: No Does patient usually wear dentures?: No  CIWA:    COWS:     Treatment Plan Summary: Daily contact with patient to assess and evaluate symptoms and progress in treatment Medication management  Plan:  Continue crisis management and stabilization.  Medication management: Reviewed with patient who denies side effects. Continue Effexor. Latuda 40 mg initiated starting today.  Encouraged patient to attend groups and participate in group counseling sessions and activities.  Discharge plan in  progress.  Address health issues: Vitals reviewed and stable.  Continue current treatment plan.   Medical Decision Making Problem Points:  Established problem, stable/improving (1) and Review of psycho-social stressors (1) Data Points:  Review of medication regiment & side effects (2)  I certify that inpatient services furnished can reasonably be expected to improve the patient's condition.   Kathrin Folden NP-C 07/16/2013, 2:09 PM

## 2013-07-17 DIAGNOSIS — F331 Major depressive disorder, recurrent, moderate: Secondary | ICD-10-CM

## 2013-07-17 MED ORDER — HYDROXYZINE HCL 25 MG PO TABS
25.0000 mg | ORAL_TABLET | Freq: Every evening | ORAL | Status: DC | PRN
  Administered 2013-07-17 – 2013-07-18 (×2): 25 mg via ORAL

## 2013-07-17 NOTE — Progress Notes (Signed)
D) Pt rates her depression at a 5 and her hopelessness at a 6. Denies SI presently. States that when she first came she was having thoughts of SI but not as much now. Pt's affect is flat and mood is depressed. When not in group, Pt goes to her room and sleeps.  A) Given support, reassurance and praise. Encouraged to stay out of her room and bed and go to the dayroom. Praised for her ability to come to the hospital instead of acting on her thoughts. R) Denies SI and HI. Is sad with depressed mood and flat affect.

## 2013-07-17 NOTE — Progress Notes (Signed)
[facility] MD Progress Note  07/17/2013 1:02 PM [name]  MRN:  [id number] Subjective:  [name] reports that she feels "loopy" today. She feels that it may be due to the Jordan which is a new medication for her, or possibly the Vistaril that she took for sleep last night.   She continues to endorse a significant amount of anxiety, and rates her anxiety as a 6 on a help 1-10 where 10 is the worst. She wonders if the lip to it is causing her anxiety to be increased, as she felt her heart racing after taking it. She states that she feels less depressed today, and rates her depression as a 3. She denies any suicidal ideations today, and reports her last suicidal thoughts where yesterday morning. She denies any homicidal ideation or auditory or visual hallucinations. She endorses good sleep and appetite.  Diagnosis:   Axis I: Generalized Anxiety Disorder and Major Depression, Recurrent moderate Axis II: Deferred Axis III:  Past Medical History  Diagnosis Date  . Hypertension   . Diabetes mellitus   . Arthritis   . Depression   . Anxiety   . Headache(784.0)   . History of borderline personality disorder   . Personality disorder   . Hyperlipidemia     ADL's:  Intact  Sleep: Good  Appetite:  Good  Suicidal Ideation:  Patient denies any thought, plan, or intent Homicidal Ideation:  Patient denies any thought, plan, or intent AEB (as evidenced by):  Psychiatric Specialty Exam: Review of Systems  Constitutional: Negative.   HENT: Negative.   Eyes: Negative.   Respiratory: Negative.   Cardiovascular: Negative.   Gastrointestinal: Negative.   Genitourinary: Negative.   Musculoskeletal: Negative.   Skin: Negative.   Neurological: Negative.   Endo/Heme/Allergies: Negative.   Psychiatric/Behavioral: Positive for depression. Negative for suicidal ideas and hallucinations. [name]. The patient does not have insomnia.     Blood pressure 112/79, pulse 99, temperature  98.4 F (36.9 C), temperature source Oral, resp. rate 18, height 5\' 5"  (1.651 m), weight 95.255 kg (210 lb).Body mass index is 34.95 kg/(m^2).  General Appearance: Casual  Eye Contact::  Good  Speech:  Clear and Coherent  Volume:  Decreased  Mood:  Anxious and Dysphoric  Affect:  Congruent  Thought Process:  Linear  Orientation:  Full (Time, Place, and Person)  Thought Content:  WDL  Suicidal Thoughts:  No  Homicidal Thoughts:  No  Memory:  Immediate;   Good Recent;   Good Remote;   Good  Judgement:  Fair  Insight:  Fair  Psychomotor Activity:  Normal  Concentration:  Good  Recall:  Good  Akathisia:  No  Handed:  Right  AIMS (if indicated):     Assets:  Communication Skills Desire for Improvement  Sleep:  Number of Hours: 6.75   Current Medications: Current Facility-Administered Medications  Medication Dose Route Frequency Provider Last Rate Last Dose  . acetaminophen (TYLENOL) tablet 650 mg  650 mg Oral Q6H PRN Nelly Rout, MD      . alum & mag hydroxide-simeth (MAALOX/MYLANTA) 200-200-20 MG/5ML suspension 30 mL  30 mL Oral Q4H PRN Nelly Rout, MD      . clonazePAM Scarlette Calico) tablet 0.5 mg  0.5 mg Oral TID PRN Nelly Rout, MD   0.5 mg at 07/17/13 0817  . estradiol (ESTRACE) tablet 2 mg  2 mg Oral Daily Nelly Rout, MD   2 mg at 07/17/13 0814  . hydrOXYzine (ATARAX/VISTARIL) tablet 50 mg  50 mg Oral QHS PRN,MR X 1 Fransisca Kaufmann, NP   50 mg at 07/16/13 2223  . lisinopril (PRINIVIL,ZESTRIL) tablet 20 mg  20 mg Oral Daily Nelly Rout, MD   20 mg at 07/17/13 0815  . lurasidone (LATUDA) tablet 40 mg  40 mg Oral Q breakfast Rachael Fee, MD   40 mg at 07/17/13 0813  . magnesium hydroxide (MILK OF MAGNESIA) suspension 30 mL  30 mL Oral Daily PRN Nelly Rout, MD      . medroxyPROGESTERone (PROVERA) tablet 2.5 mg  2.5 mg Oral Daily Nelly Rout, MD   2.5 mg at 07/17/13 0813  . metFORMIN (GLUCOPHAGE) tablet 500 mg  500 mg Oral BID WC Kerry Hough, PA-C   500 mg at  07/17/13 0813  . metoprolol tartrate (LOPRESSOR) tablet 25 mg  25 mg Oral BID Nelly Rout, MD   25 mg at 07/17/13 0813  . venlafaxine XR (EFFEXOR-XR) 24 hr capsule 150 mg  150 mg Oral Q breakfast Nelly Rout, MD   150 mg at 07/17/13 1610    Lab Results:  Results for orders placed during the hospital encounter of 07/14/13 (from the past 48 hour(s))  GLUCOSE, CAPILLARY     Status: Abnormal   Collection Time    07/15/13  5:16 PM      Result Value Range   Glucose-Capillary 130 (*) 70 - 99 mg/dL   Comment 1 Notify RN     Comment 2 Documented in Chart    GLUCOSE, CAPILLARY     Status: Abnormal   Collection Time    07/16/13  6:55 AM      Result Value Range   Glucose-Capillary 106 (*) 70 - 99 mg/dL   Comment 1 Notify RN    GLUCOSE, CAPILLARY     Status: Abnormal   Collection Time    07/16/13  5:05 PM      Result Value Range   Glucose-Capillary 167 (*) 70 - 99 mg/dL  GLUCOSE, CAPILLARY     Status: Abnormal   Collection Time    07/17/13  6:17 AM      Result Value Range   Glucose-Capillary 103 (*) 70 - 99 mg/dL    Physical Findings: AIMS: Facial and Oral Movements Muscles of Facial Expression: None, normal Lips and Perioral Area: None, normal Jaw: None, normal Tongue: None, normal,Extremity Movements Upper (arms, wrists, hands, fingers): None, normal Lower (legs, knees, ankles, toes): None, normal, Trunk Movements Neck, shoulders, hips: None, normal, Overall Severity Severity of abnormal movements (highest score from questions above): None, normal Incapacitation due to abnormal movements: None, normal Patient's awareness of abnormal movements (rate only patient's report): No Awareness, Dental Status Current problems with teeth and/or dentures?: No Does patient usually wear dentures?: No  CIWA:    COWS:     Treatment Plan Summary: Daily contact with patient to assess and evaluate symptoms and progress in treatment Medication management  Plan: We will continue her current  medication regimen, and give her time to adjust to the Latuda.    Medical Decision Making Problem Points:  Established problem, stable/improving (1) and Review of last therapy session (1) Data Points:  Review or order clinical lab tests (1) Review of medication regiment & side effects (2)  I certify that inpatient services furnished can reasonably be expected to improve the patient's condition.   Chyrel Taha 07/17/2013, 1:02 PM

## 2013-07-17 NOTE — [facility] Group Notes (Signed)
[facility] Group Notes:  (Clinical Social Work)  07/17/2013   3:00-4:00PM  Summary of Progress/Problems:   The main focus of today's process group was for the patient to identify something in their life that led to their hospitalization that they would like to change, then to discuss their motivation to change.  The Stages of Change were explained to the group, then each patient identified where they are in that process.  A scaling question was used with motivation interviewing to determine the patient's current motivation to change the identified behavior (1-10, low to high). The patient expressed that she self-sabotages by remaining in an unhealthy relationship.  She feels she is in Preparation stage, as she is making a schedule for herself to follow, as she does much better if she is on a routine.  She was in and out of the room several times.  Type of Therapy:  Process Group  Participation Level:  Active  Participation Quality:  Attentive  Affect:  Flat  Cognitive:  Oriented  Insight:  Developing/Improving  Engagement in Therapy:  Improving  Modes of Intervention:  Education, Motivational Interviewing   Ambrose Mantle, LCSW 07/17/2013, 4:35 PM

## 2013-07-17 NOTE — Progress Notes (Signed)
Date: 07/17/2013 Time:  0930   Goal Setting Purpose of Group: To be able to set a goal that is measurable and that can be accomplished in one day Participation Level:  Active  Participation Quality:  Appropriate  Affect:  Appropriate  Cognitive:  Appropriate  Insight:  Engaged  Engagement in Group:  Engaged  Additional Comments:  Pt stated that her goal is to talk to the doctor today about her new medication and how it is affecting her. Plans to write out her questions before seeing the doctor.  Dione Housekeeper

## 2013-07-17 NOTE — Progress Notes (Signed)
Writer spoke with patient at medication window and she requested a prn klonopin for anxiety. Patient reports that her day has been ok and the doctor has started her on a new medication that she is hopeful that it will work for her. Writer informed her on medications available if needed. Support and encouragement offered, safety maintained on unit with 15 min checks, will continue to monitor.

## 2013-07-17 NOTE — Progress Notes (Signed)
Psychoeducational Group Note  Date: 07/17/2013 Time:  1015  Group Topic/Focus:  Identifying Needs:   The focus of this group is to help patients identify their personal needs that have been historically problematic and identify healthy behaviors to address their needs.  Participation Level:  Active  Participation Quality:  Appropriate  Affect:  Appropriate  Cognitive:  Alert  Insight: improving  Engagement in Group:  Engaged  Additional Comments: Good partisipation  Dione Housekeeper

## 2013-07-17 NOTE — Progress Notes (Signed)
[facility] Group Notes:  (Nursing/MHT/Case Management/Adjunct)  Date:  07/17/2013  Time:  4:19 PM  Type of Therapy:  Psychoeducational Skills  Participation Level:  Minimal  Participation Quality:  Appropriate  Affect:  Depressed  Cognitive:  Appropriate  Insight:  Good  Engagement in Group:  Limited and Poor  Modes of Intervention:  Discussion, Socialization and Support  Summary of Progress/Problems: The purpose of this group was to discuss positive and negative coping skills and why it is important to use healthy coping skills versus unhealthy coping skills. Pt participation was very limited. Pt stated she enjoys dancing and would like to try fishing as a healthy coping skill.   Caswell Corwin 07/17/2013, 4:19 PM

## 2013-07-17 NOTE — Progress Notes (Signed)
Adult Psychoeducational Group Note  Date:  07/17/2013 Time:  2000  Group Topic/Focus:  Wrap-Up Group:   The focus of this group is to help patients review their daily goal of treatment and discuss progress on daily workbooks.  Participation Level:  Minimal  Participation Quality:  Appropriate  Affect:  Blunted and Flat  Cognitive:  Alert  Insight: Appropriate  Engagement in Group:  Engaged  Modes of Intervention:  Discussion  Additional Comments:  Pt stated that she had a good day and attended most of the groups.  Kaleen Odea R 07/17/2013, 5:56 AM

## 2013-07-18 LAB — GLUCOSE, CAPILLARY
Glucose-Capillary: 105 mg/dL — ABNORMAL HIGH (ref 70–99)
Glucose-Capillary: 132 mg/dL — ABNORMAL HIGH (ref 70–99)

## 2013-07-18 MED ORDER — ASPIRIN-ACETAMINOPHEN-CAFFEINE 250-250-65 MG PO TABS
2.0000 | ORAL_TABLET | Freq: Four times a day (QID) | ORAL | Status: DC | PRN
Start: 2013-07-18 — End: 2013-07-19
  Administered 2013-07-18: 2 via ORAL
  Filled 2013-07-18: qty 2

## 2013-07-18 MED ORDER — LURASIDONE HCL 40 MG PO TABS
40.0000 mg | ORAL_TABLET | Freq: Every day | ORAL | Status: DC
  Administered 2013-07-18: 40 mg via ORAL
  Filled 2013-07-18: qty 1
  Filled 2013-07-18: qty 3
  Filled 2013-07-18: qty 1

## 2013-07-18 MED ORDER — HYDROCORTISONE 1 % EX CREA
TOPICAL_CREAM | Freq: Three times a day (TID) | CUTANEOUS | Status: DC | PRN
Start: 2013-07-18 — End: 2013-07-19
  Administered 2013-07-18: 1 via TOPICAL
  Filled 2013-07-18: qty 1.5

## 2013-07-18 NOTE — Progress Notes (Signed)
Patient ID: [name], female   DOB: [birth year], 47 y.o.   MRN: [id number] D:Patient has been anxious today with no specific trigger.  She is wanting to be discharged today.  She rates her depression as a 3.  She denies any SI/HI/AVH today.  She is attending groups and participating in her care.  She interacts well with staff and peers on the milieu.    A:Continue to monitor medication management and MD orders.  Safety checks completed every 15 minutes per protocol.    R:Patient's behavior has been appropriate.

## 2013-07-18 NOTE — Progress Notes (Signed)
Writer spoke with patient and she requested excedrin migraine along with a klonopin. Patient reports that she needs to stop the migraine before it gets any worse. Patient reports that she has had a good day and her goal once discharged is to make a schedule for her and her daughter so that they will have a daily routine. She rates her depression at a 2 currently. Support and encouragement offered, safety maintained on unit with 15 min checks, will continue to monitor.

## 2013-07-18 NOTE — Progress Notes (Signed)
Psychoeducational Group Note  Date:  07/18/2013 Time: 0930  Group Topic/Focus:  Identifying Positives:   The focus of this group is to help patients identify positive things that have impacted their lives and to learn to focus on the positives. Participation Level:  active Participation Quality: good Affect: flat Cognitive:    Insight:  good  Engagement in Group: engaged  Additional Comments: [employer]uke RN [employer]

## 2013-07-18 NOTE — Progress Notes (Signed)
[facility] MD Progress Note  07/18/2013 12:00 PM [name]  MRN:  [id number] Subjective:  [name] was anxious to speak with this provider prior to going to lunch. She reports that she did not take her Latuda this morning, and she feels better. She is significantly less "loopy." She also only took 25 mg of Vistaril last night, and reports that she slept well. She asked if her Latuda could be changed to bedtime dosing. She also reports that she had a significant amount of anxiety yesterday when there was a CIRT event. She feels that her Kasandra Knudsen may also be causing increased anxiety. She rates her depression as a 3 on a scale of 1-10, and her anxiety as a 7. She denies any suicidal or homicidal ideation. She denies any auditory or visual hallucinations. She expressed a desire to be discharged today.  Diagnosis:   Axis I: Generalized Anxiety Disorder and Major Depression, Recurrent moderate Axis II: Deferred Axis III:  Past Medical History  Diagnosis Date  . Hypertension   . Diabetes mellitus   . Arthritis   . Depression   . Anxiety   . Headache(784.0)   . History of borderline personality disorder   . Personality disorder   . Hyperlipidemia     ADL's:  Intact  Sleep: Good  Appetite:  Good  Suicidal Ideation:  Patient denies any thought, plan, or intent Homicidal Ideation:  Patient denies any thought, plan, or intent AEB (as evidenced by):  Psychiatric Specialty Exam: Review of Systems  Constitutional: Negative.   HENT: Negative.   Eyes: Negative.   Respiratory: Negative.   Cardiovascular: Negative.   Gastrointestinal: Negative.   Genitourinary: Negative.   Musculoskeletal: Negative.   Skin: Negative.   Neurological: Negative.   Endo/Heme/Allergies: Negative.   Psychiatric/Behavioral: Positive for depression. Negative for suicidal ideas and hallucinations. [name]. The patient does not have insomnia.     Blood pressure 115/77, pulse 95, temperature 98.1  F (36.7 C), temperature source Oral, resp. rate 16, height 5\' 5"  (1.651 m), weight 95.255 kg (210 lb).Body mass index is 34.95 kg/(m^2).  General Appearance: Casual  Eye Contact::  Good  Speech:  Clear and Coherent  Volume:  Normal  Mood:  Anxious and Dysphoric  Affect:  Congruent  Thought Process:  Goal Directed and Linear  Orientation:  Full (Time, Place, and Person)  Thought Content:  WDL  Suicidal Thoughts:  No  Homicidal Thoughts:  No  Memory:  Immediate;   Good Recent;   Good Remote;   Good  Judgement:  Fair  Insight:  Fair  Psychomotor Activity:  Normal  Concentration:  Good  Recall:  Good  Akathisia:  No  Handed:  Right  AIMS (if indicated):     Assets:  Communication Skills Desire for Improvement  Sleep:  Number of Hours: 2.25   Current Medications: Current Facility-Administered Medications  Medication Dose Route Frequency Provider Last Rate Last Dose  . acetaminophen (TYLENOL) tablet 650 mg  650 mg Oral Q6H PRN Nelly Rout, MD      . alum & mag hydroxide-simeth (MAALOX/MYLANTA) 200-200-20 MG/5ML suspension 30 mL  30 mL Oral Q4H PRN Nelly Rout, MD      . clonazePAM Scarlette Calico) tablet 0.5 mg  0.5 mg Oral TID PRN Nelly Rout, MD   0.5 mg at 07/18/13 0909  . estradiol (ESTRACE) tablet 2 mg  2 mg Oral Daily Nelly Rout, MD   2 mg at 07/18/13 0854  . hydrOXYzine (ATARAX/VISTARIL) tablet 25 mg  25 mg Oral QHS PRN Jorje Guild, PA-C   25 mg at 07/17/13 2359  . lisinopril (PRINIVIL,ZESTRIL) tablet 20 mg  20 mg Oral Daily Nelly Rout, MD   20 mg at 07/18/13 0852  . lurasidone (LATUDA) tablet 40 mg  40 mg Oral QPC supper Jorje Guild, PA-C      . magnesium hydroxide (MILK OF MAGNESIA) suspension 30 mL  30 mL Oral Daily PRN Nelly Rout, MD      . medroxyPROGESTERone (PROVERA) tablet 2.5 mg  2.5 mg Oral Daily Nelly Rout, MD   2.5 mg at 07/18/13 0854  . metFORMIN (GLUCOPHAGE) tablet 500 mg  500 mg Oral BID WC Kerry Hough, PA-C   500 mg at 07/18/13 5956  . metoprolol  tartrate (LOPRESSOR) tablet 25 mg  25 mg Oral BID Nelly Rout, MD   25 mg at 07/18/13 0854  . venlafaxine XR (EFFEXOR-XR) 24 hr capsule 150 mg  150 mg Oral Q breakfast Nelly Rout, MD   150 mg at 07/18/13 3875    Lab Results:  Results for orders placed during the hospital encounter of 07/14/13 (from the past 48 hour(s))  GLUCOSE, CAPILLARY     Status: Abnormal   Collection Time    07/16/13  5:05 PM      Result Value Range   Glucose-Capillary 167 (*) 70 - 99 mg/dL  GLUCOSE, CAPILLARY     Status: Abnormal   Collection Time    07/17/13  6:17 AM      Result Value Range   Glucose-Capillary 103 (*) 70 - 99 mg/dL  GLUCOSE, CAPILLARY     Status: Abnormal   Collection Time    07/17/13  5:14 PM      Result Value Range   Glucose-Capillary 163 (*) 70 - 99 mg/dL   Comment 1 Notify RN    GLUCOSE, CAPILLARY     Status: Abnormal   Collection Time    07/18/13  6:12 AM      Result Value Range   Glucose-Capillary 105 (*) 70 - 99 mg/dL    Physical Findings: AIMS: Facial and Oral Movements Muscles of Facial Expression: None, normal Lips and Perioral Area: None, normal Jaw: None, normal Tongue: None, normal,Extremity Movements Upper (arms, wrists, hands, fingers): None, normal Lower (legs, knees, ankles, toes): None, normal, Trunk Movements Neck, shoulders, hips: None, normal, Overall Severity Severity of abnormal movements (highest score from questions above): None, normal Incapacitation due to abnormal movements: None, normal Patient's awareness of abnormal movements (rate only patient's report): No Awareness, Dental Status Current problems with teeth and/or dentures?: No Does patient usually wear dentures?: No  CIWA:    COWS:     Treatment Plan Summary: Daily contact with patient to assess and evaluate symptoms and progress in treatment Medication management  Plan: We will change her Latuda to take after supper. She is instructed to monitor her self for increase in anxiety during  the period of time after taking her Latuda. Otherwise we will continue her medication regimen as currently prescribed. We will continue 2 prepare for discharge by arranging followup plans  Medical Decision Making Problem Points:  Established problem, stable/improving (1) and Review of last therapy session (1) Data Points:  Review or order clinical lab tests (1) Review of medication regiment & side effects (2)  I certify that inpatient services furnished can reasonably be expected to improve the patient's condition.   Azekiel Cremer 07/18/2013, 12:00 PM

## 2013-07-18 NOTE — [facility] Group Notes (Signed)
[facility] Group Notes:  (Clinical Social Work)  07/18/2013   3:00-4:00PM  Summary of Progress/Problems:   The main focus of today's process group was to   identify the patient's current support system and decide on other supports that can be put in place.  The picture on workbook was used to discuss why additional supports are needed, and a hand-out was distributed with four definitions/levels of support, then used to talk about how patients have given and received all different kinds of support.  An emphasis was placed on using counselor, doctor, therapy groups, 12-step groups, and problem-specific support groups to expand supports.  The patient identified a problem with her current support of therapist, stating that this person checks her phone constantly during sessions.  The patient is hesitant to point out the disrespectfulness of this, because she is being seen pro bono.  The group gave her suggestions, but mostly let her know this was not acceptable.  She is interested in seeking out other therapy options, such as online, and is limited because of her inability to drive due to her anxiety.  LCSW recommended calling her local [facility] for suggestions.  Type of Therapy:  Process Group  Participation Level:  Active  Participation Quality:  Attentive and Sharing  Affect:  Anxious and Flat  Cognitive:  Appropriate and Oriented  Insight:  Engaged  Engagement in Therapy:  Engaged  Modes of Intervention:  Education,  Support and [employer], LCSW 07/18/2013, 4:32 PM

## 2013-07-18 NOTE — Progress Notes (Signed)
Writer spoke with patient at medication window and she reports having had a good day but the incident that took place in the cafeteria earlier had her feeling very anxious and she did not feel up to attending wrap up group. Patient denies si/hi/a/v hallucinations. Patient given scheduled 2000 medication and voiced no other complaint, support and encouragement offered, patient returned to her room to rest. Safety maintained on unit, will continue to monitor with 15 min checks.

## 2013-07-18 NOTE — Progress Notes (Signed)
Psychoeducational Group Note  Date:  07/18/2013 Time: 1015 Group Topic/Focus:  Making Healthy Choices:   The focus of this group is to help patients identify negative/unhealthy choices they were using prior to admission and identify positive/healthier coping strategies to replace them upon discharge.  Participation Level:  Active  Participation Quality:  Appropriate  Affect:  Appropriate  Cognitive:  Appropriate  Insight:  Engaged  Engagement in Group:  Engaged  Additional Comments:    Rich Brave 3:58 PM. 07/18/2013

## 2013-07-18 NOTE — Progress Notes (Signed)
[facility] Group Notes:  (Nursing/MHT/Case Management/Adjunct)  Date:  07/18/2013  Time:  6:56 PM  Type of Therapy:  Psychoeducational Skills  Participation Level:  Active  Participation Quality:  Appropriate and Supportive  Affect:  Appropriate  Cognitive:  Appropriate  Insight:  Good  Engagement in Group:  Engaged and Supportive  Modes of Intervention:  Clarification, Discussion and Socialization  Summary of Progress/Problems: The purpose of this group was to discuss healthy supports that patients utilize when discharged and support they would like to build. Pt stated she has a really good friend that is a good support, however she is not always available and pt stated she wants to build more support. Pt also stated that she is now aware of the Mental Health hotline that she can call anytime.

## 2013-07-19 LAB — GLUCOSE, CAPILLARY: Glucose-Capillary: 125 mg/dL — ABNORMAL HIGH (ref 70–99)

## 2013-07-19 MED ORDER — LURASIDONE HCL 40 MG PO TABS: 40.0000 mg | ORAL_TABLET | Freq: Every day | ORAL | Status: DC

## 2013-07-19 MED ORDER — VENLAFAXINE HCL ER 150 MG PO CP24: 150.0000 mg | ORAL_CAPSULE | Freq: Every morning | ORAL | Status: DC

## 2013-07-19 MED ORDER — LISINOPRIL 20 MG PO TABS: 20.0000 mg | ORAL_TABLET | ORAL | Status: DC

## 2013-07-19 MED ORDER — ESTRADIOL 2 MG PO TABS: 2.0000 mg | ORAL_TABLET | ORAL | Status: DC

## 2013-07-19 MED ORDER — MEDROXYPROGESTERONE ACETATE 5 MG PO TABS: 2.5000 mg | ORAL_TABLET | Freq: Every morning | ORAL | Status: DC

## 2013-07-19 MED ORDER — CLONAZEPAM 0.5 MG PO TABS: 0.5000 mg | ORAL_TABLET | Freq: Three times a day (TID) | ORAL | Status: DC | PRN

## 2013-07-19 NOTE — [facility] Group Notes (Signed)
[facility] LCSW Aftercare Discharge Planning Group Note   07/19/2013 11:32 AM  Participation Quality:  Appropriate Active  Mood/Affect:  Appropriate  Depression Rating:  1  Anxiety Rating:  6  Thoughts of Suicide:  No  Will you contract for safety?   NA  Current AVH:  NA  Plan for Discharge/Comments:  Patient attending discharge planning group and actively participated in group.  She reports being much better today and reports being ready to discharge home.   Patient will follow up with [facility] in [place] and [place].  CSW provided all participants with daily workbook.   Transportation Means:   Supports:  Mylia Pondexter, Joesph July

## 2013-07-19 NOTE — Tx Team (Signed)
Interdisciplinary Treatment Plan Update   Date Reviewed:  07/19/2013  Time Reviewed:  10:29 AM  Progress in Treatment:   Attending groups: Yes Participating in groups: Yes Taking medication as prescribed: Yes  Tolerating medication: Yes Family/Significant other contact made: Yes, contact made with mother  Patient understands diagnosis: Yes  Discussing patient identified problems/goals with staff: Yes Medical problems stabilized or resolved: Yes Denies suicidal/homicidal ideation: Yes Patient has not harmed self or others: Yes  For review of initial/current patient goals, please see plan of care.  Estimated Length of Stay:   Discharge home today  Reasons for Continued Hospitalization:   New Problems/Goals identified:    Discharge Plan or Barriers:   Home with outpatient follow up with [facility]  Additional Comments: N/A  Attendees:  Patient:  07/19/2013 10:29 AM   Signature:  Verne Spurr, PA 07/19/2013 10:29 AM  Signature:  Elliot Cousin, RN 07/19/2013 10:29 AM  Signature:  Neill Loft, RN 07/19/2013 10:29 AM  Signature:  Quintella Reichert 07/19/2013 10:29 AM  Signature:  07/19/2013 10:29 AM  Signature:  Juline Patch, LCSW 07/19/2013 10:29 AM  Signature:  Reyes Ivan, LCSW 07/19/2013 10:29 AM  Signature:  Maseta Dorley,Care Coordinator 07/19/2013 10:29 AM  Signature 07/19/2013 10:29 AM  Signature:    Signature:    Signature:      Scribe for Treatment Team:   Juline Patch,  07/19/2013 10:29 AM

## 2013-07-19 NOTE — Progress Notes (Signed)
Patient ID: [name], female   DOB: 22-Oct-1966, 47 y.o.   MRN: [id number] D:  Patient's self inventory sheet, patient has fair sleep, good appetite, normal energy level, good attention span.  Rated depression #2, hopelessness #3.  Denied withdrawals.  Denied SI.  Has experienced headache in past 24 hours.  After discharge, plans to stay on a schedule, take meds, follow up with MD.  No problems taking meds after discharge. A:  Medications administered per MD orders.  Emotional support and encouragement given patient. R:  Denied SI and HI.  Denied A/V hallucinations.  Will continue to monitor patient with 15 minute checks for safety.  Safety maintained.

## 2013-07-19 NOTE — Progress Notes (Signed)
Patient ID: [name], female   DOB: December 18, 1966, 47 y.o.   MRN: [id number] Discharge Note:  Patient discharged home with husband.  Patient denied SI and HI.  Denied A/V hallucinations.  Denied pain.  Patient received all her belongings, clothing, miscellaneous items, toiletries, prescriptions, medications.   Suicide prevention information given and discussed with patient, who stated she understood and had no questions.  Patient stated she appreciated all assistance received from [facility] staff.

## 2013-07-19 NOTE — [facility] Suicide Risk Assessment (Signed)
Suicide Risk Assessment  Discharge Assessment     Demographic Factors:  Caucasian  Mental Status Per Nursing Assessment::   On Admission:     Current Mental Status by Physician: In full contact with reality. There are no suicidal ideas,plans or intent. Her mood is "better" her affect is appropriate. She states she is feeling better. She is having some anxiety shortly after she takes the [place] but state she feel is helping for what she is willing to give it longer   Loss Factors: Decline in physical health  Historical Factors: NA  Risk Reduction Factors:   Sense of responsibility to family, Living with another person, especially a relative and Positive social support  Continued Clinical Symptoms:  Depression:   Insomnia  Cognitive Features That Contribute To Risk:  Closed-mindedness Polarized thinking Thought constriction (tunnel vision)    Suicide Risk:  Minimal: No identifiable suicidal ideation.  Patients presenting with no risk factors but with morbid ruminations; may be classified as minimal risk based on the severity of the depressive symptoms  Discharge Diagnoses:   AXIS I:  Major Depression recurrent, Anxiety Disorder NOS AXIS II:  Deferred AXIS III:   Past Medical History  Diagnosis Date  . Hypertension   . Diabetes mellitus   . Arthritis   . Depression   . Anxiety   . Headache(784.0)   . History of borderline personality disorder   . Personality disorder   . Hyperlipidemia    AXIS IV:  problems with primary support group AXIS V:  61-70 mild symptoms  Plan Of Care/Follow-up recommendations:  Activity:  as tolerated Diet:  regular Follow up [facility]/Dr. Arfeen She will let them know about the response to Latuda Is patient on multiple antipsychotic therapies at discharge:  No   Has Patient had three or more failed trials of antipsychotic monotherapy by history:  No  Recommended Plan for Multiple Antipsychotic Therapies:  N/A  Rashi Giuliani A 07/19/2013, 12:42 PM

## 2013-07-19 NOTE — Progress Notes (Signed)
Adult Psychoeducational Group Note  Date:  07/19/2013 Time:  2:02 PM  Group Topic/Focus:  Dimensions of Wellness:   The focus of this group is to introduce the topic of wellness and discuss the role each dimension of wellness plays in total health.  Participation Level:  Active  Participation Quality:  Appropriate  Affect:  Appropriate  Cognitive:  Appropriate  Insight: Appropriate  Engagement in Group:  Engaged  Modes of Intervention:  Discussion  Elijio Miles 07/19/2013, 2:02 PM

## 2013-07-19 NOTE — Discharge Summary (Signed)
Physician Discharge Summary Note  Patient:  [name] is an 47 y.o., female MRN:  [id number] DOB:  Mar 02, 1966 Patient phone:  [phone] (home)  Patient address:   [address] [place] [address],   Date of Admission:  07/14/2013 Date of Discharge: 07/19/2013  Reason for Admission:  Major depressive disorder with suicidal ideation with plan  Discharge Diagnoses: Principal Problem:   Major depressive disorder, recurrent Active Problems:   Borderline personality disorder  Review of Systems  Constitutional: Negative.  Negative for fever, chills, weight loss, malaise/fatigue and diaphoresis.  HENT: Negative for congestion and sore throat.   Eyes: Negative for blurred vision, double vision and photophobia.  Respiratory: Negative for cough, shortness of breath and wheezing.   Cardiovascular: Negative for chest pain, palpitations and PND.  Gastrointestinal: Negative for heartburn, nausea, vomiting, abdominal pain, diarrhea and constipation.  Musculoskeletal: Negative for myalgias, joint pain and falls.  Neurological: Negative for dizziness, tingling, tremors, sensory change, speech change, focal weakness, seizures, loss of consciousness, weakness and headaches.  Endo/Heme/Allergies: Negative for polydipsia. Does not bruise/bleed easily.  Psychiatric/Behavioral: Negative for depression, suicidal ideas, hallucinations, memory loss and substance abuse. The patient is not nervous/anxious and does not have insomnia.   Assessment:  AXIS I: Generalized Anxiety Disorder and Major Depression, Recurrent severe  AXIS II: Deferred  AXIS III:  Past Medical History   Diagnosis  Date   .  Hypertension    .  Diabetes mellitus    .  Arthritis    .  Depression    .  Anxiety    .  Headache(784.0)    .  History of borderline personality disorder    .  Personality disorder    .  Hyperlipidemia     AXIS IV: other psychosocial or environmental problems, problems related to social  environment, problems with access to health care services and problems with primary support group  AXIS V: 41-50 serious symptoms   ALevel of Care:  OP  Hospital Course:  [name] presented to the [facility] hospital as a walk in reporting increased depression and suicidal ideation with plans to overdose on her medication, jump out of a window, or run into traffic. She was admitted voluntarily and accepted for treatment.     Her symptoms of depression, anhedonia, hypersomnia, fatigue, anxiety, panic attacks, and weight gain as well as psychomotor retardation, suicidal thoughts, and increased appetite were identified and addressed.     [name] was seen and evaluated by the Treatment team consisting of Psychiatrist, PAC, RN, Case Manager, and Therapist for evaluation and treatment plan with goal of stabilization upon discharge  Multiple modalities of treatment were used including medication management, individual and group therapies, unit programming, improved nutrition, physical activity, and family sessions as needed.     Her symptoms were monitored daily with evaluation by clinical provider. Her mental and emotional status was reported by a daily self inventory completed by the patient. Observational and interactive notes were documented each day by the clinical providers, nursing staff, mental health techs, case managers, and recreational therapists to provide regular updates on this patient's status and progress. Any concerns were identified and addressed.      Improvement was demonstrated by declining numbers on the self assessment, improving vital signs, increased cognition, and improvement in mood, sleep, appetite as well as a reduction in physical symptoms.       The patient was evaluated and found to be stable enough for discharge and was released  to return home per the initial plan of treatment.   Mental Status Exam:  For mental status exam please see mental status exam and  suicide risk assessment  completed by attending physician prior to discharge.  Consults:  None  Significant Diagnostic Studies:  None  Discharge Vitals:   Blood pressure 126/82, pulse 90, temperature 97.8 F (36.6 C), temperature source Oral, resp. rate 20, height 5\' 5"  (1.651 m), weight 95.255 kg (210 lb). Body mass index is 34.95 kg/(m^2). Lab Results:   Results for orders placed during the hospital encounter of 07/14/13 (from the past 72 hour(s))  GLUCOSE, CAPILLARY     Status: Abnormal   Collection Time    07/16/13  5:05 PM      Result Value Range   Glucose-Capillary 167 (*) 70 - 99 mg/dL  GLUCOSE, CAPILLARY     Status: Abnormal   Collection Time    07/17/13  6:17 AM      Result Value Range   Glucose-Capillary 103 (*) 70 - 99 mg/dL  GLUCOSE, CAPILLARY     Status: Abnormal   Collection Time    07/17/13  5:14 PM      Result Value Range   Glucose-Capillary 163 (*) 70 - 99 mg/dL   Comment 1 Notify RN    GLUCOSE, CAPILLARY     Status: Abnormal   Collection Time    07/18/13  6:12 AM      Result Value Range   Glucose-Capillary 105 (*) 70 - 99 mg/dL  GLUCOSE, CAPILLARY     Status: Abnormal   Collection Time    07/18/13  5:11 PM      Result Value Range   Glucose-Capillary 132 (*) 70 - 99 mg/dL  GLUCOSE, CAPILLARY     Status: Abnormal   Collection Time    07/19/13  6:19 AM      Result Value Range   Glucose-Capillary 125 (*) 70 - 99 mg/dL    Physical Findings: AIMS: Facial and Oral Movements Muscles of Facial Expression: None, normal Lips and Perioral Area: None, normal Jaw: None, normal Tongue: None, normal,Extremity Movements Upper (arms, wrists, hands, fingers): None, normal Lower (legs, knees, ankles, toes): None, normal, Trunk Movements Neck, shoulders, hips: None, normal, Overall Severity Severity of abnormal movements (highest score from questions above): None, normal Incapacitation due to abnormal movements: None, normal Patient's awareness of abnormal movements (rate only patient's  report): No Awareness, Dental Status Current problems with teeth and/or dentures?: No Does patient usually wear dentures?: No  CIWA:  CIWA-Ar Total: 1 COWS:  COWS Total Score: 2  Psychiatric Specialty Exam: See Psychiatric Specialty Exam and Suicide Risk Assessment completed by Attending Physician prior to discharge.  Discharge destination:  Home  Is patient on multiple antipsychotic therapies at discharge:  No   Has Patient had three or more failed trials of antipsychotic monotherapy by history:  No  Recommended Plan for Multiple Antipsychotic Therapies: Not applicable  Discharge Orders   Future Orders Complete By Expires     Diet - low sodium heart healthy  As directed     Discharge instructions  As directed     Comments:      Take all of your medications as directed. Be sure to keep all of your follow up appointments.  If you are unable to keep your follow up appointment, call your Doctor's office to let them know, and reschedule.  Make sure that you have enough medication to last until your appointment. Be sure to get  plenty of rest. Going to bed at the same time each night will help. Try to avoid sleeping during the day.  Increase your activity as tolerated. Regular exercise will help you to sleep better and improve your mental health. Eating a heart healthy diet is recommended. Try to avoid salty or fried foods. Be sure to avoid all alcohol and illegal drugs.    Increase activity slowly  As directed         Medication List       Indication   clonazePAM 0.5 MG tablet  Commonly known as:  KLONOPIN  Take 1 tablet (0.5 mg total) by mouth 3 (three) times daily as needed for anxiety (anxiety).    anxiety   estradiol 2 MG tablet  Commonly known as:  ESTRACE  Take 1 tablet (2 mg total) by mouth every morning.   Indication:  'Change of Life' Signs     furosemide 20 MG tablet  Commonly known as:  LASIX  Take 20 mg by mouth daily as needed (For lower extremity swellings.).    Indication:  High Blood Pressure     lisinopril 20 MG tablet  Commonly known as:  PRINIVIL,ZESTRIL  Take 1 tablet (20 mg total) by mouth every morning.   Indication:  High Blood Pressure     lurasidone 40 MG Tabs  Commonly known as:  LATUDA  Take 1 tablet (40 mg total) by mouth daily after supper.   Indication:  Depressive Phase of Manic-Depression, recurrent depression     medroxyPROGESTERone 5 MG tablet  Commonly known as:  PROVERA  Take 0.5 tablets (2.5 mg total) by mouth every morning.   Indication:  Overgrowth of the Uterine Lining, Symptoms Following Menopause     metFORMIN 500 MG tablet  Commonly known as:  GLUCOPHAGE  Take 1 tablet (500 mg total) by mouth 2 (two) times daily with a meal. For diabetes control   Indication:  Type 2 Diabetes     metoprolol tartrate 25 MG tablet  Commonly known as:  LOPRESSOR  Take 1 tablet (25 mg total) by mouth 2 (two) times daily. For hypertension   Indication:  High Blood Pressure     phenylephrine-shark liver oil-mineral oil-petrolatum 0.25-3-14-71.9 % rectal ointment  Commonly known as:  PREPARATION H  Place 1 application rectally 2 (two) times daily as needed for hemorrhoids.   Hemrrhoids   venlafaxine XR 150 MG 24 hr capsule  Commonly known as:  EFFEXOR-XR  Take 1 capsule (150 mg total) by mouth every morning.   Indication:  Major Depressive Disorder, Panic Disorder           Follow-up Information   Follow up with Drl Roddenbaugh - [facility] On 07/26/2013. (Dr. Arlina Robes on Monday,, July 26, 2013 at 1:00 PM)    Contact information:   [address] [place], [place]   [address]      Follow up with Dr. Lolly Mustache  On 08/10/2013. (You are scheduled with Dr. Lolly Mustache on Tuesday, August, 12, 2014 at 4:15 PM at [facility].  If there is a cancellation, you will be contacted for an earlier appointment)    Contact information:   [facility]  [address] [place], [place]  [address]  [phone]       Follow-up recommendations:   Activities: Resume activity as tolerated. Diet: Heart healthy low sodium diet Tests: Follow up testing will be determined by your out patient provider.  Comments:   Continue to work on the  life style changes that could help to better manage your mood disorder Total Discharge Time:  Greater than 30 minutes.  Signed: Rona Ravens. [place] RPAC 12:58 PM 07/19/2013 Agree with assessment and plan Madie Reno A. Dub Mikes, M.D.

## 2013-07-19 NOTE — Progress Notes (Signed)
[facility] Adult Case Management Discharge Plan :  Will you be returning to the same living situation after discharge: Yes,  Patient is returning to her home. At discharge, do you have transportation home?: yes, Patient has transportation. Do you have the ability to pay for your medications:  Patient is able to obtain medications.   Release of information consent forms completed and in the chart;  Patient's signature needed at discharge.  Patient to Follow up at: Follow-up Information   Follow up with Drl Roddenbaugh - [facility] On 07/26/2013. (Dr. Arlina Robes on Monday,, July 26, 2013 at 1:00 PM)    Contact information:   [address] [place], [place]   [address]      Follow up with Dr. Lolly Mustache  On 08/10/2013. (You are scheduled with Dr. Lolly Mustache on Tuesday, August, 12, 2014 at 4:15 PM at [facility].  If there is a cancellation, you will be contacted for an earlier appointment)    Contact information:   [facility]  [address] [place], [place]  [address]  [phone]      Patient denies SI/HI:   Patient no longer endorsing SI/HI or other thoughts of self harm.  Safety Planning and Suicide Prevention discussed:  .Reviewed with all patients during discharge planning group   Nolia Tschantz, Joesph July 07/19/2013, 11:29 AM
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
## 2013-07-21 NOTE — Progress Notes (Signed)
Agree with assessment and plan Brenae Lasecki A. Vena Bassinger, M.D. 

## 2013-07-21 NOTE — Progress Notes (Signed)
Agree with assessment and plan Waller Marcussen A. Wanza Szumski, M.D. 

## 2013-07-22 NOTE — Progress Notes (Signed)
Patient Discharge Instructions:  Next Level Care Provider Has Access to the EMR, 07/22/13 Records provided to [facility] via [facility]/Epic access.  Jerelene Redden, 07/22/2013, 2:47 PM

## 2013-07-26 ENCOUNTER — Ambulatory Visit (HOSPITAL_COMMUNITY): Payer: Self-pay | Admitting: Psychology

## 2013-07-27 ENCOUNTER — Ambulatory Visit (HOSPITAL_COMMUNITY): Payer: Self-pay | Admitting: Psychiatry

## 2013-07-29 ENCOUNTER — Ambulatory Visit (INDEPENDENT_AMBULATORY_CARE_PROVIDER_SITE_OTHER): Payer: Medicare Other | Admitting: Psychiatry

## 2013-07-29 ENCOUNTER — Encounter (HOSPITAL_COMMUNITY): Payer: Self-pay | Admitting: Psychiatry

## 2013-07-29 VITALS — BP 119/78 | HR 83 | Wt 209.0 lb

## 2013-07-29 DIAGNOSIS — F329 Major depressive disorder, single episode, unspecified: Secondary | ICD-10-CM | POA: Diagnosis not present

## 2013-07-29 DIAGNOSIS — F339 Major depressive disorder, recurrent, unspecified: Secondary | ICD-10-CM

## 2013-07-29 MED ORDER — ARIPIPRAZOLE 2 MG PO TABS: 2.0000 mg | ORAL_TABLET | Freq: Every day | ORAL | Status: DC
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
## 2013-08-03 ENCOUNTER — Ambulatory Visit (HOSPITAL_COMMUNITY): Payer: Self-pay | Admitting: Psychology

## 2013-08-10 ENCOUNTER — Ambulatory Visit (HOSPITAL_COMMUNITY): Payer: Self-pay | Admitting: Psychiatry

## 2013-08-11 ENCOUNTER — Ambulatory Visit (HOSPITAL_COMMUNITY): Payer: Self-pay | Admitting: Psychiatry

## 2013-08-19 ENCOUNTER — Ambulatory Visit (INDEPENDENT_AMBULATORY_CARE_PROVIDER_SITE_OTHER): Payer: Medicare Other | Admitting: Psychiatry

## 2013-08-19 ENCOUNTER — Encounter (HOSPITAL_COMMUNITY): Payer: Self-pay | Admitting: Psychiatry

## 2013-08-19 VITALS — BP 150/90 | Ht 65.0 in | Wt 206.0 lb

## 2013-08-19 DIAGNOSIS — F339 Major depressive disorder, recurrent, unspecified: Secondary | ICD-10-CM

## 2013-08-19 MED ORDER — CLONAZEPAM 0.5 MG PO TABS: 0.5000 mg | ORAL_TABLET | Freq: Three times a day (TID) | ORAL | Status: DC | PRN

## 2013-08-19 MED ORDER — VENLAFAXINE HCL ER 75 MG PO CP24
225.0000 mg | ORAL_CAPSULE | Freq: Every day | ORAL | Status: DC
Start: 2013-08-19 — End: 2013-10-19

## 2013-08-19 MED ORDER — ARIPIPRAZOLE 2 MG PO TABS: 2.0000 mg | ORAL_TABLET | Freq: Every day | ORAL | Status: DC
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
## 2013-08-27 ENCOUNTER — Encounter (HOSPITAL_COMMUNITY): Payer: Self-pay | Admitting: Psychology

## 2013-08-27 ENCOUNTER — Ambulatory Visit (INDEPENDENT_AMBULATORY_CARE_PROVIDER_SITE_OTHER): Payer: Medicare Other | Admitting: Psychology

## 2013-08-27 DIAGNOSIS — F429 Obsessive-compulsive disorder, unspecified: Secondary | ICD-10-CM

## 2013-08-27 DIAGNOSIS — F339 Major depressive disorder, recurrent, unspecified: Secondary | ICD-10-CM

## 2013-08-27 DIAGNOSIS — F410 Panic disorder [episodic paroxysmal anxiety] without agoraphobia: Secondary | ICD-10-CM | POA: Diagnosis not present

## 2013-08-27 NOTE — Progress Notes (Signed)
Patient:  [name]   DOB: November 18, 1966  MR Number: [id number]  Location: BEHAVIORAL [facility] [address] [place] [address] Dept: [phone]  Start: 11 AM End: 12 PM  Provider/Observer:     Hershal Coria PSYD  Chief Complaint:      Chief Complaint  Patient presents with  . Anxiety  . Depression    Reason For Service:     The patient was referred because of issues of recurrent severe depression and anxiety. The patient has a history of suicidal ideation as well as suicidal gestures. The patient was recently hospitalized for severe depression with suicidal ideation. She also has severe anxiety with OCD and panic symptoms. Phobias around driving and other stressors permeate. The patient is also displayed symptoms of agoraphobia. There may also be some significant axis II symptoms as well.   Interventions Strategy:  Cognitive/behavioral psychotherapeutic interventions  Participation Level:   Active  Participation Quality:  Appropriate      Behavioral Observation:  Well Groomed, Alert, and Appropriate.   Current Psychosocial Factors:  The patient reports that she has done fairly well since she was discharged from the hospital. She reports that she is been going to a support group with her friend and also was able to go to the beach recently for a few days with her friend who also has anxiety disorders and she fell safe going there as the friend would understand her symptoms.  Content of Session:   Review current symptoms and continued work on therapeutic interventions.  Current Status:   The patient reports there have been some improvements in her anxiety and depression but they continue to be quite severe. Avoidant behavior continues to be significant.  Patient Progress:   Stable  Target Goals:   Target goals include reducing the intensity, duration, and frequency of severe anxiety and  depression.  Last Reviewed:   08/27/2013  Goals Addressed Today:    Today we worked on cognitive behavioral interventions for depression and anxiety as well as avoidance behaviors.  Impression/Diagnosis:   The patient is a long history of severe depressive episodes as well as severe anxiety symptoms. OCD symptoms, avoidance behaviors are all present. There may also be some significant axis II symptoms.  Diagnosis:    Axis I: Major depressive disorder, recurrent  Panic disorder  OCD (obsessive compulsive disorder)

## 2013-08-31 DIAGNOSIS — Z23 Encounter for immunization: Secondary | ICD-10-CM | POA: Diagnosis not present

## 2013-08-31 DIAGNOSIS — E785 Hyperlipidemia, unspecified: Secondary | ICD-10-CM | POA: Diagnosis not present

## 2013-08-31 DIAGNOSIS — I10 Essential (primary) hypertension: Secondary | ICD-10-CM | POA: Diagnosis not present

## 2013-08-31 DIAGNOSIS — E119 Type 2 diabetes mellitus without complications: Secondary | ICD-10-CM | POA: Diagnosis not present

## 2013-09-01 ENCOUNTER — Other Ambulatory Visit: Payer: Self-pay | Admitting: Obstetrics & Gynecology

## 2013-09-02 ENCOUNTER — Telehealth (HOSPITAL_COMMUNITY): Payer: Self-pay | Admitting: Psychiatry

## 2013-09-03 NOTE — Telephone Encounter (Signed)
Supply of 90 called to pharmacy

## 2013-09-10 ENCOUNTER — Ambulatory Visit (INDEPENDENT_AMBULATORY_CARE_PROVIDER_SITE_OTHER): Payer: Medicare Other | Admitting: Psychology

## 2013-09-10 ENCOUNTER — Encounter (HOSPITAL_COMMUNITY): Payer: Self-pay | Admitting: Psychology

## 2013-09-10 DIAGNOSIS — F410 Panic disorder [episodic paroxysmal anxiety] without agoraphobia: Secondary | ICD-10-CM | POA: Diagnosis not present

## 2013-09-10 DIAGNOSIS — F339 Major depressive disorder, recurrent, unspecified: Secondary | ICD-10-CM

## 2013-09-10 DIAGNOSIS — F429 Obsessive-compulsive disorder, unspecified: Secondary | ICD-10-CM

## 2013-09-10 NOTE — Progress Notes (Signed)
Patient:  [name]   DOB: Aug 23, 1966  MR Number: [id number]  Location: BEHAVIORAL [facility] [address] [place] [address] Dept: [phone]  Start: 10 AM End: 11 AM  Provider/Observer:     Hershal Coria PSYD  Chief Complaint:      Chief Complaint  Patient presents with  . Anxiety  . Depression  . Panic Attack  . Stress  . Trauma    Reason For Service:     The patient was referred because of issues of recurrent severe depression and anxiety. The patient has a history of suicidal ideation as well as suicidal gestures. The patient was recently hospitalized for severe depression with suicidal ideation. She also has severe anxiety with OCD and panic symptoms. Phobias around driving and other stressors permeate. The patient is also displayed symptoms of agoraphobia. There may also be some significant axis II symptoms as well.   Interventions Strategy:  Cognitive/behavioral psychotherapeutic interventions  Participation Level:   Active  Participation Quality:  Appropriate      Behavioral Observation:  Well Groomed, Alert, and Appropriate.   Current Psychosocial Factors:  The patient reports that she has not been able to get to her support group in the past week because her friend who also deals with significant and severe anxiety was not able to go and the patient was not able to travel to [place] do to her condition. The patient reports that she has been trying to work on systematic desensitization efforts we have been working on including working on trying to drive to the course facility in [place] so she can take her daughter there.  Content of Session:   Review current symptoms and continued work on therapeutic interventions.  Current Status:   The patient reports there have been some improvements in her anxiety and depression but they continue to be quite severe. Avoidant behavior  continues to be significant.  Patient Progress:   Stable  Target Goals:   Target goals include reducing the intensity, duration, and frequency of severe anxiety and depression.  Last Reviewed:   09/10/2013  Goals Addressed Today:    Today we worked on cognitive behavioral interventions for depression and anxiety as well as avoidance behaviors.  Impression/Diagnosis:   The patient is a long history of severe depressive episodes as well as severe anxiety symptoms. OCD symptoms, avoidance behaviors are all present. There may also be some significant axis II symptoms.  Diagnosis:    Axis I: Major depressive disorder, recurrent  Panic disorder  OCD (obsessive compulsive disorder)

## 2013-09-17 ENCOUNTER — Ambulatory Visit (INDEPENDENT_AMBULATORY_CARE_PROVIDER_SITE_OTHER): Payer: Medicare Other | Admitting: Psychiatry

## 2013-09-17 ENCOUNTER — Encounter (HOSPITAL_COMMUNITY): Payer: Self-pay | Admitting: Psychiatry

## 2013-09-17 VITALS — BP 140/80 | Ht 65.0 in | Wt 210.0 lb

## 2013-09-17 DIAGNOSIS — F339 Major depressive disorder, recurrent, unspecified: Secondary | ICD-10-CM | POA: Diagnosis not present

## 2013-09-17 MED ORDER — CLONAZEPAM 1 MG PO TABS: 1.0000 mg | ORAL_TABLET | Freq: Three times a day (TID) | ORAL | Status: DC
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
## 2013-09-17 NOTE — Patient Instructions (Addendum)
Decrease Effexor by one pill a week Start Brintellix

## 2013-09-23 DIAGNOSIS — J309 Allergic rhinitis, unspecified: Secondary | ICD-10-CM | POA: Diagnosis not present

## 2013-09-30 ENCOUNTER — Other Ambulatory Visit: Payer: Self-pay | Admitting: Obstetrics & Gynecology

## 2013-10-01 ENCOUNTER — Ambulatory Visit (HOSPITAL_COMMUNITY): Payer: Self-pay | Admitting: Psychology

## 2013-10-01 ENCOUNTER — Telehealth (HOSPITAL_COMMUNITY): Payer: Self-pay | Admitting: Psychiatry

## 2013-10-01 NOTE — Telephone Encounter (Signed)
Left message to call back  

## 2013-10-04 ENCOUNTER — Telehealth (HOSPITAL_COMMUNITY): Payer: Self-pay | Admitting: Psychiatry

## 2013-10-04 NOTE — Telephone Encounter (Signed)
callled back to say it was probably another med

## 2013-10-04 NOTE — Telephone Encounter (Signed)
Pt probably came off Effexor too fast. Will add Effexor xr 75 back and come ion tomorrow. No SI now, will stay with sister tonight

## 2013-10-05 ENCOUNTER — Ambulatory Visit (INDEPENDENT_AMBULATORY_CARE_PROVIDER_SITE_OTHER): Payer: Medicare Other | Admitting: Psychiatry

## 2013-10-05 ENCOUNTER — Encounter (HOSPITAL_COMMUNITY): Payer: Self-pay | Admitting: Psychiatry

## 2013-10-05 VITALS — BP 150/90 | Ht 65.0 in | Wt 211.0 lb

## 2013-10-05 DIAGNOSIS — F329 Major depressive disorder, single episode, unspecified: Secondary | ICD-10-CM

## 2013-10-05 DIAGNOSIS — F339 Major depressive disorder, recurrent, unspecified: Secondary | ICD-10-CM

## 2013-10-05 NOTE — Patient Instructions (Signed)
Take Brintellix 5 mg at bedtime, Effexor xr 75 mg in the am
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
## 2013-10-12 ENCOUNTER — Ambulatory Visit (INDEPENDENT_AMBULATORY_CARE_PROVIDER_SITE_OTHER): Payer: Medicare Other | Admitting: Psychology

## 2013-10-12 DIAGNOSIS — F429 Obsessive-compulsive disorder, unspecified: Secondary | ICD-10-CM

## 2013-10-12 DIAGNOSIS — F410 Panic disorder [episodic paroxysmal anxiety] without agoraphobia: Secondary | ICD-10-CM

## 2013-10-12 DIAGNOSIS — F339 Major depressive disorder, recurrent, unspecified: Secondary | ICD-10-CM | POA: Diagnosis not present

## 2013-10-15 ENCOUNTER — Ambulatory Visit (HOSPITAL_COMMUNITY): Payer: Self-pay | Admitting: Psychiatry

## 2013-10-19 ENCOUNTER — Ambulatory Visit (INDEPENDENT_AMBULATORY_CARE_PROVIDER_SITE_OTHER): Payer: Medicare Other | Admitting: Psychiatry

## 2013-10-19 ENCOUNTER — Encounter (HOSPITAL_COMMUNITY): Payer: Self-pay | Admitting: Psychiatry

## 2013-10-19 VITALS — BP 120/80 | Ht 65.0 in | Wt 209.0 lb

## 2013-10-19 DIAGNOSIS — F339 Major depressive disorder, recurrent, unspecified: Secondary | ICD-10-CM

## 2013-10-19 DIAGNOSIS — F329 Major depressive disorder, single episode, unspecified: Secondary | ICD-10-CM | POA: Diagnosis not present

## 2013-10-19 MED ORDER — CLONAZEPAM 1 MG PO TABS: 1.0000 mg | ORAL_TABLET | Freq: Three times a day (TID) | ORAL | Status: DC

## 2013-10-19 MED ORDER — PAROXETINE HCL 10 MG PO TABS: 10.0000 mg | ORAL_TABLET | Freq: Every day | ORAL | Status: DC

## 2013-10-19 MED ORDER — VENLAFAXINE HCL ER 75 MG PO CP24: 225.0000 mg | ORAL_CAPSULE | Freq: Every day | ORAL | Status: DC

## 2013-10-19 MED ORDER — ARIPIPRAZOLE 2 MG PO TABS: 2.0000 mg | ORAL_TABLET | Freq: Every day | ORAL | Status: DC
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
## 2013-10-29 ENCOUNTER — Ambulatory Visit (INDEPENDENT_AMBULATORY_CARE_PROVIDER_SITE_OTHER): Payer: Medicare Other | Admitting: Psychology

## 2013-10-29 ENCOUNTER — Encounter (HOSPITAL_COMMUNITY): Payer: Self-pay | Admitting: Psychology

## 2013-10-29 DIAGNOSIS — F339 Major depressive disorder, recurrent, unspecified: Secondary | ICD-10-CM

## 2013-10-29 DIAGNOSIS — F410 Panic disorder [episodic paroxysmal anxiety] without agoraphobia: Secondary | ICD-10-CM

## 2013-10-29 DIAGNOSIS — F429 Obsessive-compulsive disorder, unspecified: Secondary | ICD-10-CM

## 2013-10-29 NOTE — Progress Notes (Signed)
Patient:  [name]   DOB: February 03, 1966  MR Number: [id number]  Location: [facility] [address] [place] [address] Dept: [phone]  Start: 9 AM End: 10 AM  Provider/Observer:     Hershal Coria PSYD  Chief Complaint:      Chief Complaint  Patient presents with  . Anxiety  . Depression    Reason For Service:     The patient was referred because of issues of recurrent severe depression and anxiety. The patient has a history of suicidal ideation as well as suicidal gestures. The patient was recently hospitalized for severe depression with suicidal ideation. She also has severe anxiety with OCD and panic symptoms. Phobias around driving and other stressors permeate. The patient is also displayed symptoms of agoraphobia. There may also be some significant axis II symptoms as well.   Interventions Strategy:  Cognitive/behavioral psychotherapeutic interventions  Participation Level:   Active  Participation Quality:  Appropriate      Behavioral Observation:  Well Groomed, Alert, and Appropriate.   Current Psychosocial Factors:  The patient reports that she has been getting out and doing more activities recently. She reports that this is made her very tired and she is wanted to take some naps during the day. However, she has resisted doing that her request regarding improve sleep hygiene.  Content of Session:   Review current symptoms and continued work on therapeutic interventions.  Current Status:   The patient reports that overall her depression has been better although she had some times with increased anxiety because she has been trying to get out more   as part of the therapeutic interventions. Patient Progress:   Stable  Target Goals:   Target goals include reducing the intensity, duration, and frequency of severe anxiety and depression.  Last Reviewed:   10/29/2013  Goals  Addressed Today:    Today we worked on cognitive behavioral interventions for depression and anxiety as well as avoidance behaviors.  Impression/Diagnosis:   The patient is a long history of severe depressive episodes as well as severe anxiety symptoms. OCD symptoms, avoidance behaviors are all present. There may also be some significant axis II symptoms.  Diagnosis:    Axis I: Major depressive disorder, recurrent  Panic disorder  OCD (obsessive compulsive disorder)

## 2013-11-04 ENCOUNTER — Other Ambulatory Visit: Payer: Self-pay

## 2013-11-16 ENCOUNTER — Encounter (HOSPITAL_COMMUNITY): Payer: Self-pay | Admitting: Psychiatry

## 2013-11-16 ENCOUNTER — Ambulatory Visit (INDEPENDENT_AMBULATORY_CARE_PROVIDER_SITE_OTHER): Payer: Medicare Other | Admitting: Psychiatry

## 2013-11-16 VITALS — BP 140/82 | Ht 65.0 in | Wt 214.0 lb

## 2013-11-16 DIAGNOSIS — F339 Major depressive disorder, recurrent, unspecified: Secondary | ICD-10-CM

## 2013-11-16 DIAGNOSIS — F329 Major depressive disorder, single episode, unspecified: Secondary | ICD-10-CM | POA: Diagnosis not present
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
## 2013-11-23 ENCOUNTER — Encounter (HOSPITAL_COMMUNITY): Payer: Self-pay | Admitting: Psychology

## 2013-11-23 NOTE — Progress Notes (Signed)
Patient:  [name]   DOB: 15-Dec-1966  MR Number: [id number]  Location: [facility] [address] [place] [address] Dept: [phone]  Start: 11 AM End: 12 PM  Provider/Observer:     Hershal Coria PSYD  Chief Complaint:      Chief Complaint  Patient presents with  . Anxiety  . Depression  . Stress    Reason For Service:     The patient was referred because of issues of recurrent severe depression and anxiety. The patient has a history of suicidal ideation as well as suicidal gestures. The patient was recently hospitalized for severe depression with suicidal ideation. She also has severe anxiety with OCD and panic symptoms. Phobias around driving and other stressors permeate. The patient is also displayed symptoms of agoraphobia. There may also be some significant axis II symptoms as well.   Interventions Strategy:  Cognitive/behavioral psychotherapeutic interventions  Participation Level:   Active  Participation Quality:  Appropriate      Behavioral Observation:  Well Groomed, Alert, and Appropriate.   Current Psychosocial Factors:  The patient reports that she has not been able to get to her support group in the past week because her friend who also deals with significant and severe anxiety was not able to go and the patient was not able to travel to [place] do to her condition. The patient reports that she has been trying to work on systematic desensitization efforts we have been working on including working on trying to drive to the course facility in [place] so she can take her daughter there.  Content of Session:   Review current symptoms and continued work on therapeutic interventions.  Current Status:   The patient reports there have been some improvements in her anxiety and depression but they continue to be quite severe. Avoidant behavior continues to be  significant.  Patient Progress:   Stable  Target Goals:   Target goals include reducing the intensity, duration, and frequency of severe anxiety and depression.  Last Reviewed:   10/12/2013  Goals Addressed Today:    Today we worked on cognitive behavioral interventions for depression and anxiety as well as avoidance behaviors.  Impression/Diagnosis:   The patient is a long history of severe depressive episodes as well as severe anxiety symptoms. OCD symptoms, avoidance behaviors are all present. There may also be some significant axis II symptoms.  Diagnosis:    Axis I: Major depressive disorder, recurrent  Panic disorder  OCD (obsessive compulsive disorder)

## 2013-11-24 ENCOUNTER — Ambulatory Visit (HOSPITAL_COMMUNITY): Payer: Self-pay | Admitting: Psychology

## 2013-12-01 ENCOUNTER — Ambulatory Visit (INDEPENDENT_AMBULATORY_CARE_PROVIDER_SITE_OTHER): Payer: Medicare Other | Admitting: Psychiatry

## 2013-12-01 ENCOUNTER — Encounter (HOSPITAL_COMMUNITY): Payer: Self-pay | Admitting: Psychiatry

## 2013-12-01 VITALS — BP 140/80 | Ht 65.0 in | Wt 213.0 lb

## 2013-12-01 DIAGNOSIS — F329 Major depressive disorder, single episode, unspecified: Secondary | ICD-10-CM | POA: Diagnosis not present

## 2013-12-01 DIAGNOSIS — F339 Major depressive disorder, recurrent, unspecified: Secondary | ICD-10-CM
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
## 2013-12-03 DIAGNOSIS — I10 Essential (primary) hypertension: Secondary | ICD-10-CM | POA: Diagnosis not present

## 2013-12-03 DIAGNOSIS — E119 Type 2 diabetes mellitus without complications: Secondary | ICD-10-CM | POA: Diagnosis not present

## 2013-12-03 DIAGNOSIS — E782 Mixed hyperlipidemia: Secondary | ICD-10-CM | POA: Diagnosis not present

## 2013-12-07 DIAGNOSIS — J019 Acute sinusitis, unspecified: Secondary | ICD-10-CM | POA: Diagnosis not present

## 2013-12-07 DIAGNOSIS — E785 Hyperlipidemia, unspecified: Secondary | ICD-10-CM | POA: Diagnosis not present

## 2013-12-07 DIAGNOSIS — I10 Essential (primary) hypertension: Secondary | ICD-10-CM | POA: Diagnosis not present

## 2013-12-07 DIAGNOSIS — E119 Type 2 diabetes mellitus without complications: Secondary | ICD-10-CM | POA: Diagnosis not present

## 2013-12-14 ENCOUNTER — Ambulatory Visit (INDEPENDENT_AMBULATORY_CARE_PROVIDER_SITE_OTHER): Payer: Medicare Other | Admitting: Psychology

## 2013-12-14 DIAGNOSIS — F429 Obsessive-compulsive disorder, unspecified: Secondary | ICD-10-CM | POA: Diagnosis not present

## 2013-12-14 DIAGNOSIS — F339 Major depressive disorder, recurrent, unspecified: Secondary | ICD-10-CM

## 2013-12-14 DIAGNOSIS — F410 Panic disorder [episodic paroxysmal anxiety] without agoraphobia: Secondary | ICD-10-CM | POA: Diagnosis not present

## 2013-12-27 ENCOUNTER — Other Ambulatory Visit: Payer: Self-pay | Admitting: Obstetrics & Gynecology

## 2014-01-03 ENCOUNTER — Encounter (HOSPITAL_COMMUNITY): Payer: Self-pay | Admitting: Psychiatry

## 2014-01-03 ENCOUNTER — Ambulatory Visit (INDEPENDENT_AMBULATORY_CARE_PROVIDER_SITE_OTHER): Payer: Medicare Other | Admitting: Psychiatry

## 2014-01-03 VITALS — BP 110/76 | Ht 65.0 in | Wt 210.0 lb

## 2014-01-03 DIAGNOSIS — F329 Major depressive disorder, single episode, unspecified: Secondary | ICD-10-CM

## 2014-01-03 DIAGNOSIS — F339 Major depressive disorder, recurrent, unspecified: Secondary | ICD-10-CM

## 2014-01-03 MED ORDER — ARIPIPRAZOLE 2 MG PO TABS: 2.0000 mg | ORAL_TABLET | Freq: Every day | ORAL | Status: DC

## 2014-01-03 MED ORDER — VENLAFAXINE HCL ER 75 MG PO CP24: 225.0000 mg | ORAL_CAPSULE | Freq: Every day | ORAL | Status: DC

## 2014-01-03 MED ORDER — CLONAZEPAM 1 MG PO TABS: 1.0000 mg | ORAL_TABLET | Freq: Three times a day (TID) | ORAL | Status: DC

## 2014-01-03 MED ORDER — PAROXETINE HCL 20 MG PO TABS: 20.0000 mg | ORAL_TABLET | Freq: Every day | ORAL | Status: DC
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
## 2014-01-07 ENCOUNTER — Encounter (HOSPITAL_COMMUNITY): Payer: Self-pay | Admitting: Psychology

## 2014-01-07 NOTE — Progress Notes (Signed)
Patient:  [name]   DOB: 1966/10/31  MR Number: [id number]  Location: [facility] [address] [place] [address] Dept: [phone]  Start: 10 AM End: 11 AM  Provider/Observer:     Edgardo Roys PSYD  Chief Complaint:      Chief Complaint  Patient presents with  . Anxiety  . Depression  . Stress    Reason For Service:     The patient was referred because of issues of recurrent severe depression and anxiety. The patient has a history of suicidal ideation as well as suicidal gestures. The patient was recently hospitalized for severe depression with suicidal ideation. She also has severe anxiety with OCD and panic symptoms. Phobias around driving and other stressors permeate. The patient is also displayed symptoms of agoraphobia. There may also be some significant axis II symptoms as well.   Interventions Strategy:  Cognitive/behavioral psychotherapeutic interventions  Participation Level:   Active  Participation Quality:  Appropriate      Behavioral Observation:  Well Groomed, Alert, and Appropriate.   Current Psychosocial Factors:  The patient reports that she is continuing to be more active and physical and that is improved her sleep somewhat. The patient reports that she has been experiencing some improvements in her depression but anxiety continues to be problematic.  Content of Session:   Review current symptoms and continued work on therapeutic interventions.  Current Status:   The patient reports that overall her depression has been better although she had some times with increased anxiety because she has been trying to get out more   as part of the therapeutic interventions. Patient Progress:   Stable  Target Goals:   Target goals include reducing the intensity, duration, and frequency of severe anxiety and depression.  Last Reviewed:   12/14/2013  Goals Addressed  Today:    Today we worked on cognitive behavioral interventions for depression and anxiety as well as avoidance behaviors.  Impression/Diagnosis:   The patient is a long history of severe depressive episodes as well as severe anxiety symptoms. OCD symptoms, avoidance behaviors are all present. There may also be some significant axis II symptoms.  Diagnosis:    Axis I: Major depressive disorder, recurrent  Panic disorder  OCD (obsessive compulsive disorder)

## 2014-01-12 ENCOUNTER — Encounter (HOSPITAL_COMMUNITY): Payer: Self-pay | Admitting: Psychiatry

## 2014-01-12 ENCOUNTER — Ambulatory Visit (INDEPENDENT_AMBULATORY_CARE_PROVIDER_SITE_OTHER): Payer: Medicare Other | Admitting: Psychiatry

## 2014-01-12 ENCOUNTER — Ambulatory Visit (HOSPITAL_COMMUNITY): Payer: Self-pay | Admitting: Psychiatry

## 2014-01-12 VITALS — BP 140/84 | HR 88 | Ht 65.0 in | Wt 210.4 lb

## 2014-01-12 DIAGNOSIS — F419 Anxiety disorder, unspecified: Principal | ICD-10-CM

## 2014-01-12 DIAGNOSIS — F329 Major depressive disorder, single episode, unspecified: Secondary | ICD-10-CM

## 2014-01-12 DIAGNOSIS — F332 Major depressive disorder, recurrent severe without psychotic features: Secondary | ICD-10-CM | POA: Diagnosis not present

## 2014-01-12 DIAGNOSIS — F410 Panic disorder [episodic paroxysmal anxiety] without agoraphobia: Secondary | ICD-10-CM | POA: Diagnosis not present

## 2014-01-12 DIAGNOSIS — F32A Depression, unspecified: Secondary | ICD-10-CM
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
## 2014-01-13 ENCOUNTER — Ambulatory Visit (INDEPENDENT_AMBULATORY_CARE_PROVIDER_SITE_OTHER): Payer: Medicare Other | Admitting: Psychology

## 2014-01-13 ENCOUNTER — Encounter (HOSPITAL_COMMUNITY): Payer: Self-pay | Admitting: Psychology

## 2014-01-13 ENCOUNTER — Telehealth (HOSPITAL_COMMUNITY): Payer: Self-pay | Admitting: *Deleted

## 2014-01-13 DIAGNOSIS — F339 Major depressive disorder, recurrent, unspecified: Secondary | ICD-10-CM | POA: Diagnosis not present

## 2014-01-13 DIAGNOSIS — F410 Panic disorder [episodic paroxysmal anxiety] without agoraphobia: Secondary | ICD-10-CM

## 2014-01-13 NOTE — Progress Notes (Signed)
Writer informed pt of MD decision that she is not a candidate for Transcranial Magnetic Stimulation at this time due to her high levels of anxiety and her regimen of Klonopin. Pt was advised to discuss this with her treatment team including psychiatrist and therapist in hopes of mitigating her anxiety without the use of benzodiazepines in order to make her eligible for [facility] therapy.  Will follow up via telephone in approximately one month.

## 2014-01-13 NOTE — Progress Notes (Signed)
Patient:  [name]   DOB: 07-11-1966  MR Number: [id number]  Location: [facility] [address] [place] [address] Dept: [phone]  Start: 11 AM End: 12 PM  Provider/Observer:     Edgardo Roys PSYD  Chief Complaint:      Chief Complaint  Patient presents with  . Anxiety  . Depression  . Panic Attack    Reason For Service:     The patient was referred because of issues of recurrent severe depression and anxiety. The patient has a history of suicidal ideation as well as suicidal gestures. The patient was recently hospitalized for severe depression with suicidal ideation. She also has severe anxiety with OCD and panic symptoms. Phobias around driving and other stressors permeate. The patient is also displayed symptoms of agoraphobia. There may also be some significant axis II symptoms as well.   Interventions Strategy:  Cognitive/behavioral psychotherapeutic interventions  Participation Level:   Active  Participation Quality:  Appropriate      Behavioral Observation:  Well Groomed, Alert, and Appropriate.   Current Psychosocial Factors:  Patient went for evaluation .  Content of Session:   Review current symptoms and continued work on therapeutic interventions.  Current Status:   The patient reports that overall her depression has been better although she had some times with increased anxiety because she has been trying to get out more   as part of the therapeutic interventions.  Patient Progress:   Stable  Target Goals:   Target goals include reducing the intensity, duration, and frequency of severe anxiety and depression.  Last Reviewed:   01/13/2014  Goals Addressed Today:    Today we worked on cognitive behavioral interventions for depression and anxiety as well as avoidance behaviors.  Impression/Diagnosis:   The patient is a long history of severe depressive episodes  as well as severe anxiety symptoms. OCD symptoms, avoidance behaviors are all present. There may also be some significant axis II symptoms.  Diagnosis:    Axis I: Major depressive disorder, recurrent  Panic disorder

## 2014-01-18 ENCOUNTER — Telehealth (HOSPITAL_COMMUNITY): Payer: Self-pay | Admitting: Psychiatry

## 2014-01-18 NOTE — Telephone Encounter (Signed)
Will taper off paxil. Pt was denied TCM because of being on clonazepam and she is disappointed and will need to come in sooner

## 2014-01-19 ENCOUNTER — Encounter (HOSPITAL_COMMUNITY): Payer: Self-pay | Admitting: Psychiatry

## 2014-01-19 ENCOUNTER — Ambulatory Visit (INDEPENDENT_AMBULATORY_CARE_PROVIDER_SITE_OTHER): Payer: Medicare Other | Admitting: Psychiatry

## 2014-01-19 ENCOUNTER — Encounter (HOSPITAL_COMMUNITY): Payer: Self-pay

## 2014-01-19 ENCOUNTER — Encounter (HOSPITAL_COMMUNITY): Payer: Self-pay | Admitting: Emergency Medicine

## 2014-01-19 ENCOUNTER — Emergency Department (HOSPITAL_COMMUNITY)
Admission: EM | Admit: 2014-01-19 | Discharge: 2014-01-19 | Disposition: A | Discharge status: Home / Self Care | Payer: Medicare Other | Source: Home / Self Care | Attending: Emergency Medicine | Admitting: Emergency Medicine

## 2014-01-19 ENCOUNTER — Inpatient Hospital Stay (HOSPITAL_COMMUNITY)
Admission: AD | Admit: 2014-01-19 | Discharge: 2014-01-24 | DRG: 885 | Disposition: A | Discharge status: Home / Self Care | Payer: Medicare Other | Source: Intra-hospital | Attending: Psychiatry | Admitting: Psychiatry

## 2014-01-19 VITALS — BP 120/80 | Ht 65.0 in | Wt 209.0 lb

## 2014-01-19 DIAGNOSIS — F339 Major depressive disorder, recurrent, unspecified: Secondary | ICD-10-CM

## 2014-01-19 DIAGNOSIS — E785 Hyperlipidemia, unspecified: Secondary | ICD-10-CM

## 2014-01-19 DIAGNOSIS — F329 Major depressive disorder, single episode, unspecified: Secondary | ICD-10-CM

## 2014-01-19 DIAGNOSIS — E119 Type 2 diabetes mellitus without complications: Secondary | ICD-10-CM | POA: Diagnosis present

## 2014-01-19 DIAGNOSIS — Z8739 Personal history of other diseases of the musculoskeletal system and connective tissue: Secondary | ICD-10-CM

## 2014-01-19 DIAGNOSIS — F411 Generalized anxiety disorder: Secondary | ICD-10-CM | POA: Diagnosis present

## 2014-01-19 DIAGNOSIS — I10 Essential (primary) hypertension: Secondary | ICD-10-CM

## 2014-01-19 DIAGNOSIS — Z79899 Other long term (current) drug therapy: Secondary | ICD-10-CM

## 2014-01-19 DIAGNOSIS — F429 Obsessive-compulsive disorder, unspecified: Secondary | ICD-10-CM

## 2014-01-19 DIAGNOSIS — R45851 Suicidal ideations: Secondary | ICD-10-CM

## 2014-01-19 DIAGNOSIS — F410 Panic disorder [episodic paroxysmal anxiety] without agoraphobia: Secondary | ICD-10-CM | POA: Diagnosis present

## 2014-01-19 DIAGNOSIS — F603 Borderline personality disorder: Secondary | ICD-10-CM | POA: Diagnosis present

## 2014-01-19 DIAGNOSIS — F1994 Other psychoactive substance use, unspecified with psychoactive substance-induced mood disorder: Secondary | ICD-10-CM | POA: Diagnosis present

## 2014-01-19 DIAGNOSIS — F332 Major depressive disorder, recurrent severe without psychotic features: Principal | ICD-10-CM | POA: Diagnosis present

## 2014-01-19 DIAGNOSIS — F3289 Other specified depressive episodes: Secondary | ICD-10-CM | POA: Insufficient documentation

## 2014-01-19 DIAGNOSIS — F1320 Sedative, hypnotic or anxiolytic dependence, uncomplicated: Secondary | ICD-10-CM | POA: Diagnosis present

## 2014-01-19 DIAGNOSIS — T7432XA Child psychological abuse, confirmed, initial encounter: Secondary | ICD-10-CM

## 2014-01-19 LAB — ETHANOL

## 2014-01-19 LAB — RAPID URINE DRUG SCREEN, HOSP PERFORMED
Amphetamines: NOT DETECTED
BARBITURATES: NOT DETECTED
BENZODIAZEPINES: POSITIVE — AB
COCAINE: NOT DETECTED
Opiates: NOT DETECTED
TETRAHYDROCANNABINOL: NOT DETECTED

## 2014-01-19 LAB — CBC WITH DIFFERENTIAL/PLATELET
BASOS ABS: 0 10*3/uL (ref 0.0–0.1)
BASOS PCT: 0 % (ref 0–1)
Eosinophils Absolute: 0.3 10*3/uL (ref 0.0–0.7)
Eosinophils Relative: 3 % (ref 0–5)
HEMATOCRIT: 46.4 % — AB (ref 36.0–46.0)
Hemoglobin: 15.2 g/dL — ABNORMAL HIGH (ref 12.0–15.0)
LYMPHS PCT: 31 % (ref 12–46)
Lymphs Abs: 3.2 10*3/uL (ref 0.7–4.0)
MCH: 26.3 pg (ref 26.0–34.0)
MCHC: 32.8 g/dL (ref 30.0–36.0)
MCV: 80.3 fL (ref 78.0–100.0)
MONO ABS: 0.6 10*3/uL (ref 0.1–1.0)
Monocytes Relative: 6 % (ref 3–12)
NEUTROS ABS: 6.1 10*3/uL (ref 1.7–7.7)
NEUTROS PCT: 59 % (ref 43–77)
PLATELETS: 267 10*3/uL (ref 150–400)
RBC: 5.78 MIL/uL — ABNORMAL HIGH (ref 3.87–5.11)
RDW: 14.4 % (ref 11.5–15.5)
WBC: 10.3 10*3/uL (ref 4.0–10.5)

## 2014-01-19 LAB — BASIC METABOLIC PANEL
BUN: 16 mg/dL (ref 6–23)
CHLORIDE: 97 meq/L (ref 96–112)
CO2: 25 meq/L (ref 19–32)
Calcium: 9.7 mg/dL (ref 8.4–10.5)
Creatinine, Ser: 1.05 mg/dL (ref 0.50–1.10)
GFR calc non Af Amer: 62 mL/min — ABNORMAL LOW (ref >90)
GFR, EST AFRICAN AMERICAN: 72 mL/min — AB (ref >90)
Glucose, Bld: 103 mg/dL — ABNORMAL HIGH (ref 70–99)
POTASSIUM: 4.2 meq/L (ref 3.7–5.3)
SODIUM: 136 meq/L — AB (ref 137–147)

## 2014-01-19 MED ORDER — DIPHENHYDRAMINE HCL 25 MG PO CAPS
50.0000 mg | ORAL_CAPSULE | Freq: Every evening | ORAL | Status: DC | PRN
  Administered 2014-01-19 – 2014-01-24 (×3): 50 mg via ORAL
  Filled 2014-01-19 (×3): qty 2

## 2014-01-19 MED ORDER — ALUM & MAG HYDROXIDE-SIMETH 200-200-20 MG/5ML PO SUSP
30.0000 mL | ORAL | Status: DC | PRN
  Administered 2014-01-24: 30 mL via ORAL

## 2014-01-19 MED ORDER — ESTRADIOL 2 MG PO TABS
2.0000 mg | ORAL_TABLET | ORAL | Status: DC
  Administered 2014-01-20 – 2014-01-24 (×5): 2 mg via ORAL
  Filled 2014-01-19 (×3): qty 1
  Filled 2014-01-19: qty 2
  Filled 2014-01-19 (×4): qty 1
  Filled 2014-01-19: qty 2

## 2014-01-19 MED ORDER — MAGNESIUM HYDROXIDE 400 MG/5ML PO SUSP: 30.0000 mL | Freq: Every day | ORAL | Status: DC | PRN

## 2014-01-19 MED ORDER — LISINOPRIL 20 MG PO TABS
20.0000 mg | ORAL_TABLET | ORAL | Status: DC
  Administered 2014-01-20 – 2014-01-24 (×5): 20 mg via ORAL
  Filled 2014-01-19 (×8): qty 1

## 2014-01-19 MED ORDER — PAROXETINE HCL 10 MG PO TABS
10.0000 mg | ORAL_TABLET | Freq: Every day | ORAL | Status: DC
  Administered 2014-01-20: 10 mg via ORAL
  Filled 2014-01-19 (×3): qty 1

## 2014-01-19 MED ORDER — CLONAZEPAM 1 MG PO TABS
1.0000 mg | ORAL_TABLET | Freq: Three times a day (TID) | ORAL | Status: DC
  Administered 2014-01-20 (×2): 1 mg via ORAL
  Filled 2014-01-19 (×2): qty 1

## 2014-01-19 MED ORDER — SIMVASTATIN 20 MG PO TABS
20.0000 mg | ORAL_TABLET | Freq: Every day | ORAL | Status: DC
  Administered 2014-01-20 – 2014-01-23 (×4): 20 mg via ORAL
  Filled 2014-01-19 (×6): qty 1

## 2014-01-19 MED ORDER — ARIPIPRAZOLE 2 MG PO TABS
2.0000 mg | ORAL_TABLET | Freq: Every day | ORAL | Status: DC
  Administered 2014-01-20: 2 mg via ORAL
  Filled 2014-01-19 (×3): qty 1

## 2014-01-19 MED ORDER — MEDROXYPROGESTERONE ACETATE 2.5 MG PO TABS
2.5000 mg | ORAL_TABLET | Freq: Every morning | ORAL | Status: DC
  Administered 2014-01-20 – 2014-01-24 (×5): 2.5 mg via ORAL
  Filled 2014-01-19 (×7): qty 1

## 2014-01-19 MED ORDER — VENLAFAXINE HCL ER 75 MG PO CP24
75.0000 mg | ORAL_CAPSULE | Freq: Every day | ORAL | Status: DC
  Administered 2014-01-20: 75 mg via ORAL
  Filled 2014-01-19 (×3): qty 1

## 2014-01-19 MED ORDER — METFORMIN HCL 500 MG PO TABS
500.0000 mg | ORAL_TABLET | Freq: Two times a day (BID) | ORAL | Status: DC
  Administered 2014-01-20 – 2014-01-24 (×9): 500 mg via ORAL
  Filled 2014-01-19 (×13): qty 1

## 2014-01-19 MED ORDER — METOPROLOL TARTRATE 25 MG PO TABS
25.0000 mg | ORAL_TABLET | Freq: Two times a day (BID) | ORAL | Status: DC
  Administered 2014-01-20 – 2014-01-24 (×9): 25 mg via ORAL
  Filled 2014-01-19 (×13): qty 1

## 2014-01-19 MED ORDER — ACETAMINOPHEN 325 MG PO TABS: 650.0000 mg | ORAL_TABLET | Freq: Four times a day (QID) | ORAL | Status: DC | PRN

## 2014-01-19 NOTE — ED Notes (Signed)
Pt is currently using telephone. Pt provided with BH guidelines.

## 2014-01-19 NOTE — BH Assessment (Signed)
Tele Assessment Note   [name] is an 48 y.o. female with history of Depression, Anxiety, and Borderline Personality Disorder. She drove herself to [facility] Emergency Department for help with suicidal thoughts on/off x3 weeks. Says that her suicidal thoughts are triggered by anxiety with associated panic attacks. She has a plan to overdose on medications stating, "I have plenty left over".  Her suicidal thoughts and depression are impeding her ability to care for her 52 yr old child. Sts she feeds her but is unable to do anything further. She reports vegetative symptoms (laying in bed, not grooming, or bathing). She reports not having a shower in 1 week. She is unable to contract for safety and has a history of prior suicide attempts all overdoses. Patient reports prior hospitalizations. Sts she doesn't know how many prior hospitalizations b/c it's too many to count". She was hospitalized at [facility] several times, [place] and [place] in the past. She denies HI and AVH's. She has a history of agoraphobia and fears that her symptoms are returning. She has received outpatient services from mental health providers Dr. Ross-psychiatrist and Dr. Billy Fischer- therapist.   Axis I: Major Depression, Recurrent severe and Anxiety Disorder Disorder Nos Axis II: Deferred Axis III:  Past Medical History  Diagnosis Date  . Hypertension   . Diabetes mellitus   . Arthritis   . Depression   . Anxiety   . Headache(784.0)   . History of borderline personality disorder   . Personality disorder   . Hyperlipidemia    Axis IV: other psychosocial or environmental problems, problems related to social environment, problems with access to health care services and problems with primary support group Axis V: 31-40 impairment in reality testing  Past Medical History:  Past Medical History  Diagnosis Date  . Hypertension   . Diabetes mellitus   . Arthritis   . Depression   . Anxiety   . Headache(784.0)    . History of borderline personality disorder   . Personality disorder   . Hyperlipidemia     Past Surgical History  Procedure Laterality Date  . Foot surgery    . Total abdominal hysterectomy w/ bilateral salpingoophorectomy      Family History:  Family History  Problem Relation Age of Onset  . Depression Mother   . OCD Other   . ADD / ADHD Neg Hx   . Alcohol abuse Neg Hx   . Drug abuse Neg Hx   . Anxiety disorder Neg Hx   . Bipolar disorder Neg Hx   . Dementia Neg Hx   . Paranoid behavior Neg Hx   . Schizophrenia Neg Hx   . Seizures Neg Hx   . Sexual abuse Neg Hx   . Physical abuse Neg Hx   . Ovarian cancer Sister   . Cirrhosis Sister     Social History:  reports that she has never smoked. She has never used smokeless tobacco. She reports that she does not drink alcohol or use illicit drugs.  Additional Social History:     CIWA: CIWA-Ar BP: 132/66 mmHg Pulse Rate: 83 COWS:    Allergies:  Allergies  Allergen Reactions  . Neurontin [Gabapentin] Other (See Comments)    THIRTY lb wt gain  . Lamictal [Lamotrigine] Other (See Comments)    At any dose higher than 50 mg once a day experiences dizziness, panic, and headaches.   . Trazodone And Nefazodone     Caused insomnia    Home Medications:  (  Not in a hospital admission)  OB/GYN Status:  No LMP recorded. Patient is postmenopausal.  General Assessment Data Location of Assessment:  ([facility]) Is this a Tele or Face-to-Face Assessment?: Face-to-Face Is this an Initial Assessment or a Re-assessment for this encounter?: Initial Assessment Living Arrangements: Children;Other (Comment) (56 yr old daughter ') Can pt return to current living arrangement?: Yes Admission Status: Voluntary Is patient capable of signing voluntary admission?: Yes Transfer from: [facility] Referral Source: Self/Family/Friend     [place] Living Arrangements: Children;Other (Comment) (54 yr old daughter ') Name of  Psychiatrist:  (Dr. Harrington Challenger ) Name of Therapist:  (Dr. Jefm Miles)  Education Status Is patient currently in school?: No  Risk to self Suicidal Ideation: Yes-Currently Present Suicidal Intent: Yes-Currently Present Is patient at risk for suicide?: Yes Suicidal Plan?: Yes-Currently Present Specify Current Suicidal Plan:  (overdose; pt sts "I have extra medications") Access to Means: Yes Specify Access to Suicidal Means:  (prescription medications ) What has been your use of drugs/alcohol within the last 12 months?:  (patient denies ) Previous Attempts/Gestures: Yes How many times?:  (multiple-"all overdoses") Other Self Harm Risks:  (none reported ) Triggers for Past Attempts: Other (Comment) (depression and anxiety; agoraphobia ) Intentional Self Injurious Behavior: None Family Suicide History: No Recent stressful life event(s): Other (Comment) ("I can't care for daughter like I should due to depression") Persecutory voices/beliefs?: No Depression: Yes Depression Symptoms: Feeling angry/irritable;Feeling worthless/self pity;Loss of interest in usual pleasures;Guilt;Fatigue;Isolating;Tearfulness;Insomnia;Despondent Substance abuse history and/or treatment for substance abuse?: No Suicide prevention information given to non-admitted patients: Not applicable  Risk to Others Homicidal Ideation: No Thoughts of Harm to Others: No Current Homicidal Intent: No Current Homicidal Plan: No Access to Homicidal Means: No Identified Victim:  (n/a) History of harm to others?: No Assessment of Violence: None Noted Violent Behavior Description:  (patient is calm and cooperative during assessment ) Does patient have access to weapons?: No Criminal Charges Pending?: No Does patient have a court date: No  Psychosis Hallucinations: None noted Delusions: None noted  Mental Status Report Appear/Hygiene: Disheveled Eye Contact: Good Motor Activity: Freedom of movement Speech:  Logical/coherent Level of Consciousness: Alert Mood: Depressed Affect: Appropriate to circumstance Anxiety Level: Panic Attacks Panic attack frequency:  (daily ) Most recent panic attack:  (today 01/19/2014) Thought Processes: Coherent;Relevant Judgement: Impaired Orientation: Person;Time;Situation;Place Obsessive Compulsive Thoughts/Behaviors: None  Cognitive Functioning Concentration: Decreased Memory: Recent Intact;Remote Intact IQ: Average Insight: Fair Impulse Control: Fair Appetite: Poor Weight Loss:  (none reported ) Weight Gain:  (none reported ) Sleep: Decreased Total Hours of Sleep:  (varies; "since Im in the bed alot I'm up and down") Vegetative Symptoms: Staying in bed;Not bathing;Decreased grooming (has not taken bath in 1 week )  ADLScreening [facility] Assessment Services) Patient's cognitive ability adequate to safely complete daily activities?: Yes Patient able to express need for assistance with ADLs?: Yes Independently performs ADLs?: Yes (appropriate for developmental age)  Prior Inpatient Therapy Prior Inpatient Therapy: Yes Prior Therapy Dates:  (pt unable to recall ) Prior Therapy Facilty/Provider(s):  ([place], [place], and [facility]) Reason for Treatment:  (depression )  Prior Outpatient Therapy Prior Outpatient Therapy: Yes Prior Therapy Dates:  (current) Prior Therapy Facilty/Provider(s):  (psychiatrist-Dr. Harrington Challenger, therapist-Dr. Jefm Miles) Reason for Treatment:  (n/a)  ADL Screening (condition at time of admission) Patient's cognitive ability adequate to safely complete daily activities?: Yes Patient able to express need for assistance with ADLs?: Yes Independently performs ADLs?: Yes (appropriate for developmental age)  Values / Beliefs Cultural Requests During Hospitalization: None Spiritual Requests During Hospitalization: None        Additional Information 1:1 In Past 12 Months?: No CIRT Risk: No Elopement Risk: No Does patient  have medical clearance?: Yes     Disposition:  Disposition Initial Assessment Completed for this Encounter: Yes Disposition of Patient: Inpatient treatment program (Patient accepted to [facility] by Heloise Purpura, NP to Dr. Louretta Shorten ) Type of inpatient treatment program: Adult  Evangeline Gula 01/19/2014 7:26 PM

## 2014-01-19 NOTE — ED Provider Notes (Addendum)
CSN: [id number]     Arrival date & time 01/19/14  1505 History   First MD Initiated Contact with Patient 01/19/14 1604     Chief Complaint  Patient presents with  . Medical Clearance   (Consider location/radiation/quality/duration/timing/severity/associated sxs/prior Treatment) The history is provided by the patient.   patient brought in by herself for depression and suicidal thoughts. Planning to do an overdose. Has not acted out on that. Patient has had evaluations from 8 over health in the past. Dr. Harrington Challenger follows her here in [place]. Primary care Dr. Edwyna Ready called. Patient denies any medical problems.  Past Medical History  Diagnosis Date  . Hypertension   . Diabetes mellitus   . Arthritis   . Depression   . Anxiety   . Headache(784.0)   . History of borderline personality disorder   . Personality disorder   . Hyperlipidemia    Past Surgical History  Procedure Laterality Date  . Foot surgery    . Total abdominal hysterectomy w/ bilateral salpingoophorectomy     Family History  Problem Relation Age of Onset  . Depression Mother   . OCD Other   . ADD / ADHD Neg Hx   . Alcohol abuse Neg Hx   . Drug abuse Neg Hx   . Anxiety disorder Neg Hx   . Bipolar disorder Neg Hx   . Dementia Neg Hx   . Paranoid behavior Neg Hx   . Schizophrenia Neg Hx   . Seizures Neg Hx   . Sexual abuse Neg Hx   . Physical abuse Neg Hx   . Ovarian cancer Sister   . Cirrhosis Sister    History  Substance Use Topics  . Smoking status: Never Smoker   . Smokeless tobacco: Never Used  . Alcohol Use: No   OB History   Grav Para Term Preterm Abortions TAB SAB Ect Mult Living                 Review of Systems  Constitutional: Negative for fever.  HENT: Negative for congestion.   Eyes: Negative for redness.  Respiratory: Negative for shortness of breath.   Cardiovascular: Negative for chest pain.  Gastrointestinal: Negative for abdominal pain.  Genitourinary: Negative for dysuria.   Musculoskeletal: Negative for back pain.  Skin: Negative for rash.  Neurological: Negative for headaches.  Psychiatric/Behavioral: Positive for suicidal ideas. Negative for confusion.    Allergies  Neurontin; Lamictal; and Trazodone and nefazodone  Home Medications   Current Outpatient Rx  Name  Route  Sig  Dispense  Refill  . ARIPiprazole (ABILIFY) 2 MG tablet   Oral   Take 1 tablet (2 mg total) by mouth daily.   30 tablet   2   . clonazePAM (KLONOPIN) 1 MG tablet   Oral   Take 1 tablet (1 mg total) by mouth 3 (three) times daily.   90 tablet   2   . estradiol (ESTRACE) 2 MG tablet   Oral   Take 1 tablet (2 mg total) by mouth every morning.         Marland Kitchen lisinopril (PRINIVIL,ZESTRIL) 20 MG tablet   Oral   Take 1 tablet (20 mg total) by mouth every morning.         . medroxyPROGESTERone (PROVERA) 5 MG tablet   Oral   Take 0.5 tablets (2.5 mg total) by mouth every morning.         . metFORMIN (GLUCOPHAGE) 500 MG tablet   Oral   Take 1  tablet (500 mg total) by mouth 2 (two) times daily with a meal. For diabetes control         . metoprolol tartrate (LOPRESSOR) 25 MG tablet   Oral   Take 1 tablet (25 mg total) by mouth 2 (two) times daily. For hypertension   60 tablet   0   . PARoxetine (PAXIL) 10 MG tablet   Oral   Take 10 mg by mouth daily.         . pravastatin (PRAVACHOL) 20 MG tablet   Oral   Take 20 mg by mouth daily.         . pravastatin (PRAVACHOL) 40 MG tablet   Oral   Take 40 mg by mouth daily.         Marland Kitchen. venlafaxine XR (EFFEXOR-XR) 75 MG 24 hr capsule   Oral   Take 75 mg by mouth daily with breakfast.          BP 132/66  Pulse 83  Temp(Src) 98.8 F (37.1 C) (Oral)  Resp 18  Ht 5\' 5"  (1.651 m)  Wt 209 lb (94.802 kg)  BMI 34.78 kg/m2  SpO2 94% Physical Exam  Nursing note and vitals reviewed. Constitutional: She is oriented to person, place, and time. She appears well-developed and well-nourished. No distress.  HENT:   Head: Normocephalic and atraumatic.  Mouth/Throat: Oropharynx is clear and moist.  Eyes: Conjunctivae and EOM are normal. Pupils are equal, round, and reactive to light.  Neck: Normal range of motion. Neck supple.  Cardiovascular: Normal rate, regular rhythm and normal heart sounds.   No murmur heard. Pulmonary/Chest: Effort normal and breath sounds normal. No respiratory distress.  Abdominal: Soft. There is no tenderness.  Musculoskeletal: Normal range of motion. She exhibits no edema.  Neurological: She is alert and oriented to person, place, and time. No cranial nerve deficit. She exhibits normal muscle tone. Coordination normal.  Skin: Skin is warm. No rash noted.    ED Course  Procedures (including critical care time) Labs Review Labs Reviewed  URINE RAPID DRUG SCREEN (HOSP PERFORMED) - Abnormal; Notable for the following:    Benzodiazepines POSITIVE (*)    All other components within normal limits  CBC WITH DIFFERENTIAL - Abnormal; Notable for the following:    RBC 5.78 (*)    Hemoglobin 15.2 (*)    HCT 46.4 (*)    All other components within normal limits  BASIC METABOLIC PANEL - Abnormal; Notable for the following:    Sodium 136 (*)    Glucose, Bld 103 (*)    GFR calc non Af Amer 62 (*)    GFR calc Af Amer 72 (*)    All other components within normal limits  ETHANOL   Results for orders placed during the hospital encounter of 01/19/14  URINE RAPID DRUG SCREEN (HOSP PERFORMED)      Result Value Range   Opiates NONE DETECTED  NONE DETECTED   Cocaine NONE DETECTED  NONE DETECTED   Benzodiazepines POSITIVE (*) NONE DETECTED   Amphetamines NONE DETECTED  NONE DETECTED   Tetrahydrocannabinol NONE DETECTED  NONE DETECTED   Barbiturates NONE DETECTED  NONE DETECTED  CBC WITH DIFFERENTIAL      Result Value Range   WBC 10.3  4.0 - 10.5 K/uL   RBC 5.78 (*) 3.87 - 5.11 MIL/uL   Hemoglobin 15.2 (*) 12.0 - 15.0 g/dL   HCT 95.246.4 (*) 84.136.0 - 32.446.0 %   MCV 80.3  78.0 - 100.0 fL  MCH 26.3  26.0 - 34.0 pg   MCHC 32.8  30.0 - 36.0 g/dL   RDW 14.4  11.5 - 15.5 %   Platelets 267  150 - 400 K/uL   Neutrophils Relative % 59  43 - 77 %   Neutro Abs 6.1  1.7 - 7.7 K/uL   Lymphocytes Relative 31  12 - 46 %   Lymphs Abs 3.2  0.7 - 4.0 K/uL   Monocytes Relative 6  3 - 12 %   Monocytes Absolute 0.6  0.1 - 1.0 K/uL   Eosinophils Relative 3  0 - 5 %   Eosinophils Absolute 0.3  0.0 - 0.7 K/uL   Basophils Relative 0  0 - 1 %   Basophils Absolute 0.0  0.0 - 0.1 K/uL  BASIC METABOLIC PANEL      Result Value Range   Sodium 136 (*) 137 - 147 mEq/L   Potassium 4.2  3.7 - 5.3 mEq/L   Chloride 97  96 - 112 mEq/L   CO2 25  19 - 32 mEq/L   Glucose, Bld 103 (*) 70 - 99 mg/dL   BUN 16  6 - 23 mg/dL   Creatinine, Ser 1.05  0.50 - 1.10 mg/dL   Calcium 9.7  8.4 - 10.5 mg/dL   GFR calc non Af Amer 62 (*) >90 mL/min   GFR calc Af Amer 72 (*) >90 mL/min  ETHANOL      Result Value Range   Alcohol, Ethyl (B) <11  0 - 11 mg/dL    Imaging Review No results found.  EKG Interpretation   None       MDM   1. Suicidal ideation    Patient expressing depression and suicidal ideation. We'll have behavioral health team interview her. The patient most likely will require admission. Patient with history of diabetes blood sugar well controlled. Patient with history of hypertension blood pressure well-controlled. Nontoxic no acute distress from a medical standpoint. Patient is medically cleared.   Mervin Kung, MD 01/19/14 [phone]   Patient has been accepted by behavioral health the will be discharged from the emergency apartment and not transferred to behavioral health.  Mervin Kung, MD 01/19/14 [phone]

## 2014-01-19 NOTE — ED Notes (Signed)
Pt c/o depression and SI with plan to overdose.

## 2014-01-19 NOTE — BH Assessment (Signed)
Patient accepted to [facility] by Heloise Purpura, NP to Dr. Louretta Shorten. The room assignment is 507-1.

## 2014-01-19 NOTE — Patient Instructions (Signed)
Go directly to [facility] for assessment for admission

## 2014-01-19 NOTE — ED Notes (Signed)
Pt states she has very bad anxiety, to the point that she is unable to drive. States depression also due to the anxiety and that the depression has been continuous for 2 months. Pt states depression is usually intermittent, lasting only a couple of days at a times. Pt is very soft spoken. States symptoms are worse when she is alone, without her 49 year old daughter. Pt is very cooperative. Sitter at bedside.
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
## 2014-01-20 LAB — GLUCOSE, CAPILLARY
Glucose-Capillary: 218 mg/dL — ABNORMAL HIGH (ref 70–99)
Glucose-Capillary: 135 mg/dL — ABNORMAL HIGH (ref 70–99)

## 2014-01-20 MED ORDER — BUSPIRONE HCL 15 MG PO TABS
7.5000 mg | ORAL_TABLET | Freq: Two times a day (BID) | ORAL | Status: DC
  Administered 2014-01-20 – 2014-01-24 (×8): 7.5 mg via ORAL
  Filled 2014-01-20: qty 3
  Filled 2014-01-20: qty 1
  Filled 2014-01-20: qty 3
  Filled 2014-01-20 (×9): qty 1

## 2014-01-20 MED ORDER — CITALOPRAM HYDROBROMIDE 20 MG PO TABS
20.0000 mg | ORAL_TABLET | Freq: Every day | ORAL | Status: DC
  Administered 2014-01-21 – 2014-01-24 (×4): 20 mg via ORAL
  Filled 2014-01-20 (×2): qty 1
  Filled 2014-01-20: qty 3
  Filled 2014-01-20 (×3): qty 1

## 2014-01-20 MED ORDER — ARIPIPRAZOLE 5 MG PO TABS
5.0000 mg | ORAL_TABLET | Freq: Two times a day (BID) | ORAL | Status: DC
  Administered 2014-01-20 – 2014-01-24 (×8): 5 mg via ORAL
  Filled 2014-01-20 (×6): qty 1
  Filled 2014-01-20: qty 6
  Filled 2014-01-20 (×4): qty 1
  Filled 2014-01-20: qty 6

## 2014-01-20 MED ORDER — CLONAZEPAM 0.5 MG PO TABS
0.5000 mg | ORAL_TABLET | Freq: Three times a day (TID) | ORAL | Status: DC
  Administered 2014-01-20 – 2014-01-24 (×12): 0.5 mg via ORAL
  Filled 2014-01-20 (×12): qty 1

## 2014-01-20 NOTE — Tx Team (Signed)
Initial Interdisciplinary Treatment Plan  PATIENT STRENGTHS: (choose at least two) Ability for insight Average or above average intelligence General fund of knowledge Motivation for treatment/growth Supportive family/friends  PATIENT STRESSORS: Financial difficulties Health problems Loss of sister in 2011   PROBLEM LIST: Problem List/Patient Goals Date to be addressed Date deferred Reason deferred Estimated date of resolution  Increased risk for Si      Deprerssion      Anxiety                                           DISCHARGE CRITERIA:  Improved stabilization in mood, thinking, and/or behavior Motivation to continue treatment in a less acute level of care Reduction of life-threatening or endangering symptoms to within safe limits Verbal commitment to aftercare and medication compliance  PRELIMINARY DISCHARGE PLAN: Attend PHP/IOP  PATIENT/FAMIILY INVOLVEMENT: This treatment plan has been presented to and reviewed with the patient, Jeanett Schlein.  The patient and family have been given the opportunity to ask questions and make suggestions.  Dinesh Ulysse A 01/20/2014, 7:09 AM

## 2014-01-20 NOTE — Progress Notes (Signed)
Recreation Therapy Notes  Animal-Assisted Activity/Therapy (AAA/T) Program Checklist/Progress Notes Patient Eligibility Criteria Checklist & Daily Group note for Rec Tx Intervention  Date: 01.22.2015 Time: 2:45pm Location: [address]    AAA/T Program Assumption of Risk Form signed by Patient/ or Parent Legal Guardian yes  Patient is free of allergies or sever asthma yes  Patient reports no fear of animals yes  Patient reports no history of cruelty to animals yes   Patient understands his/her participation is voluntary yes  Patient washes hands before animal contact yes  Patient washes hands after animal contact yes  Behavioral Response: Engaged, Appropraite  Education: Contractor, Appropriate Animal Interaction   Education Outcome: Acknowledges understanding  Clinical Observations/Feedback: Patient interacted appropriately with peers and therapy dog team. Patient shared stories about pets she has had in the past.   Lane Hacker, LRT/CTRS  Lane Hacker 01/20/2014 3:48 PM

## 2014-01-20 NOTE — [facility] Suicide Risk Assessment (Signed)
[facility] INPATIENT:  Family/Significant Other Suicide Prevention Education  Suicide Prevention Education:  Patient Refusal for Family/Significant Other Suicide Prevention Education: The patient [name] has refused to provide written consent for family/significant other to be provided Family/Significant Other Suicide Prevention Education during admission and/or prior to discharge.  Physician notified.  Concha Pyo 01/20/2014, 11:10 AM
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
## 2014-01-20 NOTE — BHH Counselor (Signed)
Adult Psychosocial Assessment Update Interdisciplinary Team  Previous [facility] admissions/discharges:  Admissions Discharges  Date: 07/14/13 Date: 07/19/13  Date:  04/22/13 Date:  04/26/13  Date: Date:  Date: Date:  Date: Date:   Changes since the last Psychosocial Assessment (including adherence to outpatient mental health and/or substance abuse treatment, situational issues contributing to decompensation and/or relapse). Patient reports becoming increasing depression, anxiety and having suicidal ideations.  She also advised she has return to old habits of being promiscuous and not caring who she has sex with.  She reports concerns that she is spending a lot of time in bed and not giving her 1 year old daughter the attention she needs.             Discharge Plan 1. Will you be returning to the same living situation after discharge?   Yes: No:      If no, what is your plan?    Yes, patient plans to return to the home she shares with her daughter.       2. Would you like a referral for services when you are discharged? Yes:     If yes, for what services?  No:      Yes.  Patient is seen by [facility] in [place].  She shared concerns that she is not able to open up to female therapist as she should and asked if writer would follow up on her seeing the female therapist in the practice.         Summary and Recommendations (to be completed by the evaluator) Mylasia Vorhees is a 48 years old Caucasian female admitted with Major Depression Disorder, Anxiety Disorder and Boderline Personality Disorder.  She will benefit from crisis stabilization, evaluation for medication, psycho-education groups for coping skills development, group therapy and case management for discharge planning.                        Signature:  Concha Pyo, 01/20/2014 10:57 AM

## 2014-01-20 NOTE — Progress Notes (Signed)
The focus of this group is to educate the patient on the purpose and policies of crisis stabilization and provide a format to answer questions about their admission.  The group details unit policies and expectations of patients while admitted.  Patient attended 0900 nurse education orientation group this morning.  Patient listened attentively, appropriate affect, alert, appropriate insight and engagement.  Today patient will work on 3 goals for discharge.  
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
## 2014-01-20 NOTE — Progress Notes (Signed)
D:  Patient's self inventory sheet, patient needs sleep medication for poor sleep, good appetite, low energy level, poor attention span.  Rated depression, hopeless, anxiety 10.  SI off/on, contracts for safety.  Denied physical problems.  No discharge plans.  No problems taking medications after discharge. A: Medications administered per MD orders.  Emotional support and encouragement given patient. R:  Denied HI.  Denied A/V hallucinations.  SI off/on, contracts for safety.  Will continue to monitor patient for safety with 15 minute checks.  Safety maintained.
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
## 2014-01-20 NOTE — Progress Notes (Signed)
Patient ID: [name], female   DOB: 1966/09/23, 48 y.o.   MRN: [id number] PER STATE REGULATIONS 482.30  THIS CHART WAS REVIEWED FOR MEDICAL NECESSITY WITH RESPECT TO THE PATIENT'S ADMISSION/ DURATION OF STAY.  NEXT REVIEW DATE: 01/23/2014  Chauncy Lean, RN, BSN CASE MANAGER

## 2014-01-20 NOTE — Progress Notes (Signed)
Adult Psychoeducational Group Note  Date:  01/20/2014 Time:  10:58 PM  Group Topic/Focus:  Goals Group:   The focus of this group is to help patients establish daily goals to achieve during treatment and discuss how the patient can incorporate goal setting into their daily lives to aide in recovery.  Participation Level:  Active  Participation Quality:  Appropriate  Affect:  Appropriate  Cognitive:  Appropriate  Insight: Appropriate  Engagement in Group:  Engaged  Modes of Intervention:  Discussion  Additional Comments:  Pt stated that she is satisfied and glad to see the day coming to a close.  Olena Leatherwood 01/20/2014, 10:58 PM

## 2014-01-21 DIAGNOSIS — F411 Generalized anxiety disorder: Secondary | ICD-10-CM

## 2014-01-21 DIAGNOSIS — F329 Major depressive disorder, single episode, unspecified: Secondary | ICD-10-CM | POA: Diagnosis not present

## 2014-01-21 DIAGNOSIS — F410 Panic disorder [episodic paroxysmal anxiety] without agoraphobia: Secondary | ICD-10-CM | POA: Diagnosis not present

## 2014-01-21 MED ORDER — GUAIFENESIN ER 600 MG PO TB12
1200.0000 mg | ORAL_TABLET | Freq: Two times a day (BID) | ORAL | Status: DC
  Administered 2014-01-21 – 2014-01-23 (×5): 1200 mg via ORAL
  Filled 2014-01-21 (×7): qty 2
  Filled 2014-01-21: qty 12
  Filled 2014-01-21 (×2): qty 2
  Filled 2014-01-21: qty 12

## 2014-01-21 NOTE — BHH Group Notes (Signed)
[place] LCSW Group Therapy  Feelings Around Relapse 1:15 -2:30        01/21/2014    Type of Therapy:  Group Therapy  Participation Level:  Appropriate  Participation Quality:  Appropriate  Affect:  Appropriate  Cognitive:  Attentive Appropriate  Insight:  Developing/Improving  Engagement in Therapy: Developing/Improving  Modes of Intervention:  Discussion Exploration Problem-Solving Supportive  Summary of Progress/Problems:  The topic for today was feelings around relapse.    Patient processed feelings toward relapse and was able to relate to peers. She shared she would be making excuse as to why she is unable to watch a movie with her 49 year old daughter.  Patient identified coping skills that can be used to prevent a relapse.   Concha Pyo 01/21/2014

## 2014-01-21 NOTE — Progress Notes (Signed)
D: Pt denies SI/HI/AVH. Pt is pleasant and cooperative. Pt stated she had ups/ downs to day but is doing ok now. Stated she was feeling suicidal earlier, but does not feel that way now. Pt was very concerned about getting her cough medicine.  A: Pt was offered support and encouragement. Pt was given scheduled medications. Pt was encourage to attend groups. Q 15 minute checks were done for safety.   R:Pt attends groups and interacts well with peers and staff. Pt is taking medication. Pt has no complaints.Pt receptive to treatment and safety maintained on unit. 

## 2014-01-21 NOTE — Progress Notes (Signed)
D) Pt has been attending the groups and interacting with her peers. Affect is flat and mood depressed. Rates her depression and hopelessness both at an 8. Denies SI. States she wants to go to outpatient after leaving inpatient, but is fearful of driving. Verbalizes fear of driving and leaving her home. A) 1:1 provided for Pt and talked about the pros and the cons of attending outpatient treatment, even though it requires Pt to drive to [facility] and leave her safety behind. Given support praise and encouragement. R) Pt is beginning to be more determined about outpatient and look towards the future.

## 2014-01-21 NOTE — Progress Notes (Signed)
Adult Psychoeducational Group Note  Date:  01/21/2014 Time:  10:00am Group Topic/Focus:  Relapse Prevention Planning:   The focus of this group is to define relapse and discuss the need for planning to combat relapse.  Participation Level:  Active  Participation Quality:  Appropriate and Attentive  Affect:  Appropriate  Cognitive:  Alert and Appropriate  Insight: Appropriate  Engagement in Group:  Engaged  Modes of Intervention:  Discussion and Education  Additional Comments: Pt attended and participated in group. Discussion was on relapse prevention and what their definition is. Pt stated relapse prevention means doing something before I hit rock bottom to prevent suicidial thoughts.  Marlowe Shores D 01/21/2014, 1:51 PM

## 2014-01-21 NOTE — Tx Team (Signed)
Interdisciplinary Treatment Plan Update   Date Reviewed:  01/21/2014  Time Reviewed:  8:41 AM  Progress in Treatment:   Attending groups: Yes Participating in groups: Yes Taking medication as prescribed: Yes  Tolerating medication: Yes Family/Significant other contact made: No, patient declined collateral contact. Patient understands diagnosis: Yes  Discussing patient identified problems/goals with staff: Yes Medical problems stabilized or resolved: Yes Denies suicidal/homicidal ideation: Yes Patient has not harmed self or others: Yes  For review of initial/current patient goals, please see plan of care.  Estimated Length of Stay:  3-4 days  Reasons for Continued Hospitalization:  Anxiety Depression Medication stabilization   New Problems/Goals identified:    Discharge Plan or Barriers:   Home with outpatient follow up with [facility]  Additional Comments:  [name] is an 48 y.o. Caucasian female admitted voluntarily and emergently from [facility] for help with suicidal thoughts x3 weeks. Patient reportedly has increased episodes panic disorder which has triggered her suicidal thoughts. She has a plan to overdose on medications, "I have plenty left over". Her suicidal thoughts and depression are impeding her ability to care for her 1 yr old child. She feeds her but is unable to do anything further because of loss of interest, low energy, no motivation, isolation and increased vegetative symptoms staying in bed for longer hours, not grooming, or bathing. She is unable to contract for safety .   Attendees:  Patient:  01/21/2014 8:41 AM   Signature: Mylinda Latina, MD 01/21/2014 8:41 AM  Signature:  Nena Polio, PA 01/21/2014 8:41 AM  Signature:  Catalina Pizza, NP 01/21/2014 8:41 AM  Signature:Beverly Danelle Earthly, RN 01/21/2014 8:41 AM  Signature:  Thurnell Garbe RN 01/21/2014 8:41 AM  Signature:  Joette Catching, LCSW 01/21/2014 8:41 AM  Signature:   Regan Lemming, LCSW 01/21/2014 8:41 AM  Signature:  Lucinda Dell, Care Coordinator 01/21/2014 8:41 AM  Signature:  Marshall Cork, RN 01/21/2014 8:41 AM  Signature: Marilynne Halsted, RN 01/21/2014  8:41 AM  Signature:   Lars Pinks, RN [facility] 01/21/2014  8:41 AM  Signature:  Eduard Roux, RN 01/21/2014  8:41 AM    Scribe for Treatment Team:   Joette Catching,  01/21/2014 8:41 AM

## 2014-01-21 NOTE — BHH Group Notes (Signed)
[facility] LCSW Aftercare Discharge Planning Group Note   01/21/2014 10:17 AM    Participation Quality:  Appropraite  Mood/Affect:  Appropriate  Depression Rating:  7  Anxiety Rating:  8  Thoughts of Suicide:  No  Will you contract for safety?   NA  Current AVH:  No  Plan for Discharge/Comments:  Patient attended discharge planning group and actively participated in group. She will follow up with [facility] in [place].  CSW provided all participants with daily workbook.   Transportation Means: Patient has transportation.   Supports:  Patient has a support system.   Drayven Marchena, Eulas Post

## 2014-01-21 NOTE — Progress Notes (Signed)
Recreation Therapy Notes  Date: 01.23.2015 Time: 2:45pm Location: [address] Dayroom   Group Topic: Building Healthy Support System   Goal Area(s) Addresses:  Patient will identify qualities needed to build healthy support system. Patient will identify why those qualities are important.   Behavioral Response: Appropriate   Intervention: Scenario  Activity: Patients were asked to identify all qualities needed to build a healthy support system, as if they were equivalent to ingredients needed to make a cookie recipe.    Education:  Education officer, community, Dentist,   Education Outcome: Acknowledges understanding  Clinical Observations/Feedback: Patient actively participated in group session, identifying qualities needed to build her healthy support system and identifying why those qualities are important. Group discussion focused around qualities needed for building a healthy support system, development of coping skills and the importance of trust and communication to relationships. Patient shared she does not currently have an effective support system and she expressed concern she will not be able to build one post d/c. Patient provided support and encouragement, which she was receptive to. Patient offered support and encouragement to her peers as well.   Laureen Ochs Arretta Toenjes, LRT/CTRS  Lane Hacker 01/21/2014 4:38 PM

## 2014-01-21 NOTE — Progress Notes (Signed)
D: Pt denies SI/HI/AVH. Pt is pleasant and cooperative. Pt just feels depressed tonight.   A: Pt was offered support and encouragement. Pt was given scheduled medications. Pt was encourage to attend groups. Q 15 minute checks were done for safety.   R:Pt attends groups and interacts well with peers and staff. Pt is taking medication. Pt has no complaints at this time.Pt receptive to treatment and safety maintained on unit.

## 2014-01-21 NOTE — Progress Notes (Signed)
Adult Psychoeducational Group Note  Date:  01/21/2014 Time:  08:00pm Group Topic/Focus:  Wrap-Up Group:   The focus of this group is to help patients review their daily goal of treatment and discuss progress on daily workbooks.  Participation Level:  Active  Participation Quality:  Appropriate and Attentive  Affect:  Appropriate  Cognitive:  Alert and Appropriate  Insight: Appropriate  Engagement in Group:  Engaged  Modes of Intervention:  Discussion and Education  Additional Comments:  Pt attended and participated in group. Discussion was on how their day went. Pt stated she has had an up and down day felt good some of the day and down other times during the day. Pt stated she had been thinking of why she is here and feeling overwhelmed and anxious.   Marlowe Shores D 01/21/2014, 9:28 PM

## 2014-01-21 NOTE — Progress Notes (Signed)
[facility] MD Progress Note  01/21/2014 5:09 PM [name]  MRN:  [id number] Subjective:   [name] is an 48 y.o. Caucasian female admitted voluntarily and emergently from [facility] Emergency Department for help with suicidal thoughts x3 weeks. Patient reportedly has increased episodes panic disorder which has triggered her suicidal thoughts. She had a plan to overdose on medications, "I have plenty left over". Her suicidal thoughts and depression are impeding her ability to care for her 45 yr old child. She feeds her but is unable to do anything further because of loss of interest, low energy, no motivation, isolation and increased vegetative symptoms staying in bed for longer hours, not grooming, or bathing.   During today's assessment, pt reports anxiety at 6/10 and depression at 7/10; states she is "much better in this environment out of the other one" referring to being at home. When asked about her plans after discharge, pt states "I'm not sure about the changes I'm going to make, I know I'm supposed to do some [facility] therapy and that should help a lot". Pt denies SI, HI, and AVH, does contract for safety. Pt is in agreement with medication regimen and treatment plan at this time.   Stressor Disorders: Posttraumatic Stress Disorder (309.81)  Substance/Addictive Disorders:  Depressive Disorders: Major Depressive Disorder - Severe (296.23)  AXIS I: Generalized Anxiety Disorder, Major Depression, Recurrent severe, Panic Disorder and Substance Abuse  AXIS II: Deferred  AXIS III:    Diagnosis:   DSM5: Trauma-Stressor Disorders:  Posttraumatic Stress Disorder (309.81) Substance/Addictive Disorders:  Denies Depressive Disorders:  Major Depressive Disorder - Severe (296.23)  Axis I: Generalized Anxiety Disorder, Major Depression, Recurrent severe, Panic Disorder and Substance Abuse Axis II: Deferred Axis III:  Past Medical History  Diagnosis Date  . Hypertension   . Diabetes mellitus   .  Arthritis   . Anxiety   . Headache(784.0)   . History of borderline personality disorder   . Hyperlipidemia   . Depression   . Personality disorder    Axis IV: other psychosocial or environmental problems and problems related to social environment Axis V: 41-50 serious symptoms  ADL's:  Intact  Sleep: Good  Appetite:  Good  Suicidal Ideation:  Denies Homicidal Ideation:  Denies AEB (as evidenced by):  Psychiatric Specialty Exam: Review of Systems  Constitutional: Negative.   HENT: Negative.   Eyes: Negative.   Respiratory: Negative.   Cardiovascular: Negative.   Gastrointestinal: Negative.   Genitourinary: Negative.   Musculoskeletal: Negative.   Skin: Negative.   Neurological: Negative.   Endo/Heme/Allergies: Negative.   Psychiatric/Behavioral: Negative.     Blood pressure 127/81, pulse 95, temperature 97.5 F (36.4 C), temperature source Oral, resp. rate 20, height 5\' 4"  (1.626 m), weight 93.668 kg (206 lb 8 oz).Body mass index is 35.43 kg/(m^2).  General Appearance: Casual  Eye Contact::  Good  Speech:  Clear and Coherent  Volume:  Normal  Mood:  Euthymic  Affect:  Appropriate  Thought Process:  Coherent  Orientation:  Full (Time, Place, and Person)  Thought Content:  WDL  Suicidal Thoughts:  No  Homicidal Thoughts:  No  Memory:  Immediate;   Good Recent;   Good Remote;   Good  Judgement:  Fair  Insight:  Fair  Psychomotor Activity:  Normal  Concentration:  Good  Recall:  Good  Akathisia:  No  Handed:  Right  AIMS (if indicated):     Assets:  Desire for Improvement Resilience  Sleep:  Number  of Hours: 6   Current Medications: Current Facility-Administered Medications  Medication Dose Route Frequency Provider Last Rate Last Dose  . acetaminophen (TYLENOL) tablet 650 mg  650 mg Oral Q6H PRN Laverle Hobby, PA-C      . alum & mag hydroxide-simeth (MAALOX/MYLANTA) 200-200-20 MG/5ML suspension 30 mL  30 mL Oral Q4H PRN Laverle Hobby, PA-C      .  ARIPiprazole (ABILIFY) tablet 5 mg  5 mg Oral BID Durward Parcel, MD   5 mg at 01/21/14 0825  . busPIRone (BUSPAR) tablet 7.5 mg  7.5 mg Oral BID Durward Parcel, MD   7.5 mg at 01/21/14 0825  . citalopram (CELEXA) tablet 20 mg  20 mg Oral Daily Durward Parcel, MD   20 mg at 01/21/14 0825  . clonazePAM (KLONOPIN) tablet 0.5 mg  0.5 mg Oral TID Durward Parcel, MD   0.5 mg at 01/21/14 1154  . diphenhydrAMINE (BENADRYL) capsule 50 mg  50 mg Oral QHS PRN Laverle Hobby, PA-C   50 mg at 01/19/14 2337  . estradiol (ESTRACE) tablet 2 mg  2 mg Oral BH-q7a Laverle Hobby, PA-C   2 mg at 01/21/14 3151  . lisinopril (PRINIVIL,ZESTRIL) tablet 20 mg  20 mg Oral BH-q7a Spencer E Simon, PA-C   20 mg at 01/21/14 7616  . magnesium hydroxide (MILK OF MAGNESIA) suspension 30 mL  30 mL Oral Daily PRN Laverle Hobby, PA-C      . medroxyPROGESTERone (PROVERA) tablet 2.5 mg  2.5 mg Oral q morning - 10a Maurine Minister Simon, PA-C   2.5 mg at 01/21/14 0830  . metFORMIN (GLUCOPHAGE) tablet 500 mg  500 mg Oral BID WC Laverle Hobby, PA-C   500 mg at 01/21/14 0737  . metoprolol tartrate (LOPRESSOR) tablet 25 mg  25 mg Oral BID Laverle Hobby, PA-C   25 mg at 01/21/14 1062  . simvastatin (ZOCOR) tablet 20 mg  20 mg Oral q1800 Laverle Hobby, PA-C   20 mg at 01/20/14 1715    Lab Results:  Results for orders placed during the hospital encounter of 01/19/14 (from the past 48 hour(s))  GLUCOSE, CAPILLARY     Status: Abnormal   Collection Time    01/20/14  8:06 AM      Result Value Range   Glucose-Capillary 218 (*) 70 - 99 mg/dL  GLUCOSE, CAPILLARY     Status: Abnormal   Collection Time    01/20/14  5:06 PM      Result Value Range   Glucose-Capillary 135 (*) 70 - 99 mg/dL    Physical Findings: AIMS: Facial and Oral Movements Muscles of Facial Expression: None, normal Lips and Perioral Area: None, normal Jaw: None, normal Tongue: None, normal,Extremity Movements Upper  (arms, wrists, hands, fingers): None, normal Lower (legs, knees, ankles, toes): None, normal, Trunk Movements Neck, shoulders, hips: None, normal, Overall Severity Severity of abnormal movements (highest score from questions above): None, normal Incapacitation due to abnormal movements: None, normal Patient's awareness of abnormal movements (rate only patient's report): No Awareness, Dental Status Current problems with teeth and/or dentures?: No Does patient usually wear dentures?: No  CIWA:  CIWA-Ar Total: 2 COWS:  COWS Total Score: 3  Treatment Plan Summary: Daily contact with patient to assess and evaluate symptoms and progress in treatment Medication management  Plan: Review of chart, vital signs, medications, and notes.  1-Individual and group therapy  2-Medication management for depression and anxiety: Medications reviewed with the  patient and she stated no untoward effects, unchanged. 3-Coping skills for depression, anxiety  4-Continue crisis stabilization and management  5-Address health issues--monitoring vital signs, stable  6-Treatment plan in progress to prevent relapse of depression and anxiety  Medical Decision Making Problem Points:  Established problem, stable/improving (1), Review of last therapy session (1) and Review of psycho-social stressors (1) Data Points:  Review or order clinical lab tests (1) Review of medication regiment & side effects (2) Review of new medications or change in dosage (2)  I certify that inpatient services furnished can reasonably be expected to improve the patient's condition.   Benjamine Mola, FNP-BC 01/21/2014, 5:09 PM  Reviewed the information documented and agree with the treatment plan.  Fadi Menter,JANARDHAHA R. 01/21/2014 6:30 PM

## 2014-01-22 DIAGNOSIS — R45851 Suicidal ideations: Secondary | ICD-10-CM

## 2014-01-22 DIAGNOSIS — F339 Major depressive disorder, recurrent, unspecified: Secondary | ICD-10-CM

## 2014-01-22 DIAGNOSIS — F1994 Other psychoactive substance use, unspecified with psychoactive substance-induced mood disorder: Secondary | ICD-10-CM

## 2014-01-22 NOTE — Progress Notes (Signed)
D: Pt denies SI/HI/AVH. Pt is pleasant and cooperative. Pt stated she had ups/ downs to day but is doing ok now. Stated she was feeling suicidal earlier, but does not feel that way now. Pt was very concerned about getting her cough medicine.  A: Pt was offered support and encouragement. Pt was given scheduled medications. Pt was encourage to attend groups. Q 15 minute checks were done for safety.   R:Pt attends groups and interacts well with peers and staff. Pt is taking medication. Pt has no complaints.Pt receptive to treatment and safety maintained on unit.

## 2014-01-22 NOTE — [facility] Group Notes (Signed)
[facility] LCSW Group Therapy Note  01/22/2014 1:15  To 2:15 PM  Type of Therapy and Topic:  Group Therapy: Avoiding Self-Sabotaging and Enabling Behaviors  Participation Level:  Minimal   Mood: Flat depressed  Description of Group:     Learn how to identify obstacles, self-sabotaging and enabling behaviors, what are they, why do we do them and what needs do these behaviors meet? Discuss unhealthy relationships and how to have positive healthy boundaries with those that sabotage and enable. Explore aspects of self-sabotage and enabling in yourself and how to limit these self-destructive behaviors in everyday life.  Therapeutic Goals: 1. Patient will identify one obstacle that relates to self-sabotage and enabling behaviors 2. Patient will identify one personal self-sabotaging or enabling behavior they did prior to admission 3. Patient able to establish a plan to change the above identified behavior they did prior to admission:  4. Patient will demonstrate ability to communicate their needs through discussion and/or role plays.   Summary of Patient Progress: The main focus of today's process group was to explain what "self-sabotage" means and use Motivational Interviewing to discuss what benefits, negative or positive, were involved in a self-identified self-sabotaging behavior. We then talked about reasons the patient may want to change the behavior and her current desire to change. Patient was quiet yet attentive until she left group art midpoint.   Therapeutic Modalities:   Cognitive Behavioral Therapy Person-Centered Therapy Motivational Interviewing   Sheilah Pigeon, LCSW

## 2014-01-22 NOTE — Progress Notes (Signed)
D) Pt has attended the groups and goes to her room when not in group. When Pt is at home she stays in the bed a lot and has been doing some of the same while on the unit. Has to be reminded to come to the groups. When in the group will participate some. Rates her depression at a 6 and her hopelessness at an 8. Denies SI and HI A) Given support and reassurance along with appropriate praise. Encouraged to do her booklet, attend the groups and socialize with her peers. R) Denies SI and HI. Affect remains depressed

## 2014-01-22 NOTE — Progress Notes (Signed)
[facility] MD Progress Note  01/22/2014 2:31 PM [name]  MRN:  [id number] Subjective:   [name] is an 48 y.o. Caucasian female admitted voluntarily and emergently from [facility] Emergency Department for help with suicidal thoughts x3 weeks. Today she states she is feeling better "Im getting there". Pt reports anxiety at 5/10 and depression at 5/10; states she just feels numb.. When asked about her plans after discharge, pt states she is going home. Pt denies SI, HI, and AVH, does contract for safety. Pt is in agreement with medication regimen and treatment plan at this time. She is attending all groups at this time.   Stressor Disorders: Posttraumatic Stress Disorder (309.81)  Substance/Addictive Disorders:  Depressive Disorders: Major Depressive Disorder - Severe (296.23)  AXIS I: Generalized Anxiety Disorder, Major Depression, Recurrent severe, Panic Disorder and Substance Abuse  AXIS II: Deferred  AXIS III:    Diagnosis:   DSM5: Trauma-Stressor Disorders:  Posttraumatic Stress Disorder (309.81) Substance/Addictive Disorders:  Denies Depressive Disorders:  Major Depressive Disorder - Severe (296.23)  Axis I: Generalized Anxiety Disorder, Major Depression, Recurrent severe, Panic Disorder and Substance Abuse Axis II: Deferred Axis III:  Past Medical History  Diagnosis Date  . Hypertension   . Diabetes mellitus   . Arthritis   . Anxiety   . Headache(784.0)   . History of borderline personality disorder   . Hyperlipidemia   . Depression   . Personality disorder    Axis IV: other psychosocial or environmental problems and problems related to social environment Axis V: 41-50 serious symptoms  ADL's:  Intact  Sleep: Good, even though she is having more vivid dreams  Appetite:  Good states she normally eats more when she is depressed  Suicidal Ideation:  Denies Homicidal Ideation:  Denies AEB (as evidenced by):  Psychiatric Specialty Exam: Review of Systems   Constitutional: Negative.   HENT: Negative.   Eyes: Negative.   Respiratory: Negative.   Cardiovascular: Negative.   Gastrointestinal: Negative.   Genitourinary: Negative.   Musculoskeletal: Negative.   Skin: Negative.   Neurological: Negative.   Endo/Heme/Allergies: Negative.   Psychiatric/Behavioral: Negative.     Blood pressure 141/83, pulse 84, temperature 97.2 F (36.2 C), temperature source Oral, resp. rate 20, height 5\' 4"  (1.626 m), weight 93.668 kg (206 lb 8 oz).Body mass index is 35.43 kg/(m^2).  General Appearance: Casual  Eye Contact::  Good  Speech:  Clear and Coherent  Volume:  Normal  Mood:  Euthymic  Affect:  Appropriate  Thought Process:  Coherent  Orientation:  Full (Time, Place, and Person)  Thought Content:  WDL  Suicidal Thoughts:  No  Homicidal Thoughts:  No  Memory:  Immediate;   Good Recent;   Good Remote;   Good  Judgement:  Fair  Insight:  Fair  Psychomotor Activity:  Normal  Concentration:  Good  Recall:  Good  Akathisia:  No  Handed:  Right  AIMS (if indicated):     Assets:  Desire for Improvement Resilience  Sleep:  Number of Hours: 6.5   Current Medications: Current Facility-Administered Medications  Medication Dose Route Frequency Provider Last Rate Last Dose  . acetaminophen (TYLENOL) tablet 650 mg  650 mg Oral Q6H PRN Laverle Hobby, PA-C      . alum & mag hydroxide-simeth (MAALOX/MYLANTA) 200-200-20 MG/5ML suspension 30 mL  30 mL Oral Q4H PRN Laverle Hobby, PA-C      . ARIPiprazole (ABILIFY) tablet 5 mg  5 mg Oral BID Parke Simmers  Jonnalagadda, MD   5 mg at 01/22/14 0756  . busPIRone (BUSPAR) tablet 7.5 mg  7.5 mg Oral BID Durward Parcel, MD   7.5 mg at 01/22/14 0757  . citalopram (CELEXA) tablet 20 mg  20 mg Oral Daily Durward Parcel, MD   20 mg at 01/22/14 0756  . clonazePAM (KLONOPIN) tablet 0.5 mg  0.5 mg Oral TID Durward Parcel, MD   0.5 mg at 01/22/14 1159  . diphenhydrAMINE (BENADRYL)  capsule 50 mg  50 mg Oral QHS PRN Laverle Hobby, PA-C   50 mg at 01/21/14 2258  . estradiol (ESTRACE) tablet 2 mg  2 mg Oral BH-q7a Laverle Hobby, PA-C   2 mg at 01/22/14 [id number]  . guaiFENesin (MUCINEX) 12 hr tablet 1,200 mg  1,200 mg Oral BID Waldon Merl, MD   1,200 mg at 01/22/14 0757  . lisinopril (PRINIVIL,ZESTRIL) tablet 20 mg  20 mg Oral BH-q7a Spencer E Simon, PA-C   20 mg at 01/22/14 [id number]  . magnesium hydroxide (MILK OF MAGNESIA) suspension 30 mL  30 mL Oral Daily PRN Laverle Hobby, PA-C      . medroxyPROGESTERone (PROVERA) tablet 2.5 mg  2.5 mg Oral q morning - 10a Laverle Hobby, PA-C   2.5 mg at 01/22/14 0756  . metFORMIN (GLUCOPHAGE) tablet 500 mg  500 mg Oral BID WC Laverle Hobby, PA-C   500 mg at 01/22/14 0756  . metoprolol tartrate (LOPRESSOR) tablet 25 mg  25 mg Oral BID Laverle Hobby, PA-C   25 mg at 01/22/14 0756  . simvastatin (ZOCOR) tablet 20 mg  20 mg Oral q1800 Laverle Hobby, PA-C   20 mg at 01/21/14 1719    Lab Results:  Results for orders placed during the hospital encounter of 01/19/14 (from the past 48 hour(s))  GLUCOSE, CAPILLARY     Status: Abnormal   Collection Time    01/20/14  5:06 PM      Result Value Range   Glucose-Capillary 135 (*) 70 - 99 mg/dL    Physical Findings: AIMS: Facial and Oral Movements Muscles of Facial Expression: None, normal Lips and Perioral Area: None, normal Jaw: None, normal Tongue: None, normal,Extremity Movements Upper (arms, wrists, hands, fingers): None, normal Lower (legs, knees, ankles, toes): None, normal, Trunk Movements Neck, shoulders, hips: None, normal, Overall Severity Severity of abnormal movements (highest score from questions above): None, normal Incapacitation due to abnormal movements: None, normal Patient's awareness of abnormal movements (rate only patient's report): No Awareness, Dental Status Current problems with teeth and/or dentures?: No Does patient usually wear dentures?: No  CIWA:   CIWA-Ar Total: 2 COWS:  COWS Total Score: 3  Treatment Plan Summary: Daily contact with patient to assess and evaluate symptoms and progress in treatment Medication management  Plan: Review of chart, vital signs, medications, and notes.  1-Individual and group therapy  2-Medication management for depression and anxiety: Medications reviewed with the patient and she stated no untoward effects, unchanged. 3-Coping skills for depression, anxiety  4-Continue crisis stabilization and management  5-Address health issues--monitoring vital signs, stable  6-Treatment plan in progress to prevent relapse of depression and anxiety  Medical Decision Making Problem Points:  Established problem, stable/improving (1), Review of last therapy session (1) and Review of psycho-social stressors (1) Data Points:  Review or order clinical lab tests (1) Review of medication regiment & side effects (2) Review of new medications or change in dosage (2)  I certify that inpatient  services furnished can reasonably be expected to improve the patient's condition.   Nanci Pina, FNP-BC 01/22/2014, 2:31 PM  Reviewed the information documented and agree with the treatment plan.

## 2014-01-22 NOTE — [facility] Group Notes (Signed)
[facility] Group Notes:  (Nursing/MHT/Case Management/Adjunct)  Date:  01/22/2014  Time:  11:01 AM  Type of Therapy:  Psychoeducational Skills  Participation Level:  Active  Participation Quality:  Appropriate  Affect:  Appropriate  Cognitive:  Alert  Insight:  Appropriate  Engagement in Group:  Engaged  Modes of Intervention:  Activity  Summary of Progress/Problems:Pt stated she wanted to make sure she involves her family more.   Marcello Moores [facility] 01/22/2014, 11:01 AM

## 2014-01-22 NOTE — Progress Notes (Signed)
Adult Psychoeducational Group Note  Date:  01/22/2014 Time:  9:07 PM  Group Topic/Focus:  Wrap-Up Group:   The focus of this group is to help patients review their daily goal of treatment and discuss progress on daily workbooks.  Participation Level:  Active  Participation Quality:  Appropriate  Affect:  Appropriate  Cognitive:  Appropriate  Insight: Appropriate  Engagement in Group:  Engaged  Modes of Intervention:  Discussion  Additional Comments:  Patient attended group and was actively involved. She said that she has had a good day. She said that she learned in group earlier that everyone has needs and they will engage in unhealthy behaviors to meet them such as people pleasing. She said that she learned that it's okay to take time to work on herself without feeling guilty.   Harrie Foreman A 01/22/2014, 9:07 PM

## 2014-01-22 NOTE — Progress Notes (Signed)
Psychoeducational Group Note  Date: 01/22/2014 Time:  1015  Group Topic/Focus:  Identifying Needs:   The focus of this group is to help patients identify their personal needs that have been historically problematic and identify healthy behaviors to address their needs.  Participation Level:  Active  Participation Quality:  Appropriate  Affect:  Appropriate  Cognitive:  Appropriate  Insight:  Improving  Engagement in Group:  Engaged  Additional Comments:    Dilcia Rybarczyk A 

## 2014-01-23 NOTE — Progress Notes (Signed)
Patient ID: [name], female   DOB: May 04, 1966, 48 y.o.   MRN: [id number] D)   Has been rather quiet and reserved this evening, but converses with select females, pleasant.  Attended group, stated she needed to keep telling herself to focus on the positive things about herself and not listen to others, that she needed the positives to help her overcome the negatives in her life.   Affect is flat, sad.  Has been compliant with meds, came to med window afterward, got her meds and went to bed fairly early. A)  Will continue to monitor for safety, continue POC R)  Safety maintained.

## 2014-01-23 NOTE — Progress Notes (Signed)
Patient ID: [name], female   DOB: 22-Sep-1966, 48 y.o.   MRN: [id number] D)  Has spent most of the evening sitting in the dayroom, talking to select peers, interacting appropriately with staff.  Affect blunted, sad and anxious, stated had needed a med adjustment, felt like meds had stopped working.   Attended group, stated had enjoyed group today,  Wants to learn about taking care of herself, learning to say no, doing her homework. A)  Will continue to monitor for safety , continue POC, support, encouragement. R)  Safety maintained.

## 2014-01-23 NOTE — Progress Notes (Signed)
Adult Psychoeducational Group Note  Date:  01/23/2014 Time:  7:20 PM  Group Topic/Focus:  Therapeutic  Activity   Participation Level:  Did Not Attend    [name] 01/23/2014, 7:20 PM

## 2014-01-23 NOTE — Progress Notes (Signed)
[facility] MD Progress Note  01/23/2014 6:06 PM [name]  MRN:  [id number] Subjective:   [name] is an 48 y.o. Caucasian female admitted voluntarily and emergently from [facility] for help with suicidal thoughts x3 weeks. Today she states she is feeling better. Pt reports anxiety at 6/10 and depression at 6/10. She expressed concerns that she she wont be able to handle things when she gets out, and she is still scared.Initially she didn't see a way out, now she is able to talk and express herself better. She is attending groups, although some of the topics are causing her anxiety levels to increase.  Pt is in agreement with medication regimen and treatment plan at this time. She is attending all groups at this time.Denies any SI/HI/AVH, does contract for safety.   Stressor Disorders: Posttraumatic Stress Disorder (309.81)  Substance/Addictive Disorders:  Depressive Disorders: Major Depressive Disorder - Severe (296.23)  AXIS I: Generalized Anxiety Disorder, Major Depression, Recurrent severe, Panic Disorder and Substance Abuse  AXIS II: Deferred  AXIS III:    Diagnosis:   DSM5: Trauma-Stressor Disorders:  Posttraumatic Stress Disorder (309.81) Substance/Addictive Disorders:  Denies Depressive Disorders:  Major Depressive Disorder - Severe (296.23)  Axis I: Generalized Anxiety Disorder, Major Depression, Recurrent severe, Panic Disorder and Substance Abuse Axis II: Deferred Axis III:  Past Medical History  Diagnosis Date  . Hypertension   . Diabetes mellitus   . Arthritis   . Anxiety   . Headache(784.0)   . History of borderline personality disorder   . Hyperlipidemia   . Depression   . Personality disorder    Axis IV: other psychosocial or environmental problems and problems related to social environment Axis V: 41-50 serious symptoms  ADL's:  Intact  Sleep: Good, even though she is having more vivid dreams  Appetite:  Good states she normally eats  more when she is depressed  Suicidal Ideation:  Denies Homicidal Ideation:  Denies AEB (as evidenced by):  Psychiatric Specialty Exam: Review of Systems  Constitutional: Negative.   HENT: Negative.   Eyes: Negative.   Respiratory: Negative.   Cardiovascular: Negative.   Gastrointestinal: Negative.   Genitourinary: Negative.   Musculoskeletal: Negative.   Skin: Negative.   Neurological: Negative.   Endo/Heme/Allergies: Negative.   Psychiatric/Behavioral: Positive for depression. Negative for suicidal ideas, hallucinations and substance abuse. [name] and has insomnia.     Blood pressure 121/76, pulse 86, temperature 97.4 F (36.3 C), temperature source Oral, resp. rate 20, height 5\' 4"  (1.626 m), weight 93.668 kg (206 lb 8 oz).Body mass index is 35.43 kg/(m^2).  General Appearance: Casual  Eye Contact::  Good  Speech:  Clear and Coherent  Volume:  Normal  Mood:  Euthymic  Affect:  Appropriate  Thought Process:  Coherent  Orientation:  Full (Time, Place, and Person)  Thought Content:  WDL  Suicidal Thoughts:  No  Homicidal Thoughts:  No  Memory:  Immediate;   Good Recent;   Good Remote;   Good  Judgement:  Fair  Insight:  Fair  Psychomotor Activity:  Normal  Concentration:  Good  Recall:  Good  Akathisia:  No  Handed:  Right  AIMS (if indicated):     Assets:  Desire for Improvement Resilience  Sleep:  Number of Hours: 3.5   Current Medications: Current Facility-Administered Medications  Medication Dose Route Frequency Provider Last Rate Last Dose  . acetaminophen (TYLENOL) tablet 650 mg  650 mg Oral Q6H PRN Maurine Minister  Simon, PA-C      . alum & mag hydroxide-simeth (MAALOX/MYLANTA) 200-200-20 MG/5ML suspension 30 mL  30 mL Oral Q4H PRN Laverle Hobby, PA-C      . ARIPiprazole (ABILIFY) tablet 5 mg  5 mg Oral BID Durward Parcel, MD   5 mg at 01/23/14 0803  . busPIRone (BUSPAR) tablet 7.5 mg  7.5 mg Oral BID Durward Parcel, MD   7.5 mg at 01/23/14 1721  . citalopram (CELEXA) tablet 20 mg  20 mg Oral Daily Durward Parcel, MD   20 mg at 01/23/14 0804  . clonazePAM (KLONOPIN) tablet 0.5 mg  0.5 mg Oral TID Durward Parcel, MD   0.5 mg at 01/23/14 1725  . diphenhydrAMINE (BENADRYL) capsule 50 mg  50 mg Oral QHS PRN Laverle Hobby, PA-C   50 mg at 01/21/14 2258  . estradiol (ESTRACE) tablet 2 mg  2 mg Oral BH-q7a Laverle Hobby, PA-C   2 mg at 01/23/14 0630  . guaiFENesin (MUCINEX) 12 hr tablet 1,200 mg  1,200 mg Oral BID Waldon Merl, MD   1,200 mg at 01/23/14 1725  . lisinopril (PRINIVIL,ZESTRIL) tablet 20 mg  20 mg Oral BH-q7a Spencer E Simon, PA-C   20 mg at 01/23/14 1601  . magnesium hydroxide (MILK OF MAGNESIA) suspension 30 mL  30 mL Oral Daily PRN Laverle Hobby, PA-C      . medroxyPROGESTERone (PROVERA) tablet 2.5 mg  2.5 mg Oral q morning - 10a Maurine Minister Simon, PA-C   2.5 mg at 01/23/14 1019  . metFORMIN (GLUCOPHAGE) tablet 500 mg  500 mg Oral BID WC Laverle Hobby, PA-C   500 mg at 01/23/14 1725  . metoprolol tartrate (LOPRESSOR) tablet 25 mg  25 mg Oral BID Laverle Hobby, PA-C   25 mg at 01/23/14 1725  . simvastatin (ZOCOR) tablet 20 mg  20 mg Oral q1800 Laverle Hobby, PA-C   20 mg at 01/23/14 1726    Lab Results:  No results found for this or any previous visit (from the past 48 hour(s)).  Physical Findings: AIMS: Facial and Oral Movements Muscles of Facial Expression: None, normal Lips and Perioral Area: None, normal Jaw: None, normal Tongue: None, normal,Extremity Movements Upper (arms, wrists, hands, fingers): None, normal Lower (legs, knees, ankles, toes): None, normal, Trunk Movements Neck, shoulders, hips: None, normal, Overall Severity Severity of abnormal movements (highest score from questions above): None, normal Incapacitation due to abnormal movements: None, normal Patient's awareness of abnormal movements (rate only patient's report): No  Awareness, Dental Status Current problems with teeth and/or dentures?: No Does patient usually wear dentures?: No  CIWA:  CIWA-Ar Total: 2 COWS:  COWS Total Score: 3  Treatment Plan Summary: Daily contact with patient to assess and evaluate symptoms and progress in treatment Medication management  Plan: Review of chart, vital signs, medications, and notes.  1-Individual and group therapy  2-Medication management for depression and anxiety: Medications reviewed with the patient and she stated no untoward effects, unchanged. 3-Coping skills for depression, anxiety  4-Continue crisis stabilization and management  5-Address health issues--monitoring vital signs, stable  6-Treatment plan in progress to prevent relapse of depression and anxiety  Medical Decision Making Problem Points:  Established problem, stable/improving (1), Review of last therapy session (1) and Review of psycho-social stressors (1) Data Points:  Review or order clinical lab tests (1) Review of medication regiment & side effects (2) Review of new medications or change in dosage (  2)  I certify that inpatient services furnished can reasonably be expected to improve the patient's condition.   Nanci Pina, FNP-BC 01/23/2014, 6:06 PM  Reviewed the information documented and agree with the treatment plan.

## 2014-01-23 NOTE — Progress Notes (Signed)
Adult Psychoeducational Group Note  Date:  01/23/2014 Time:  9:56 PM  Group Topic/Focus:  Wrap-Up Group:   The focus of this group is to help patients review their daily goal of treatment and discuss progress on daily workbooks.  Participation Level:  Active  Participation Quality:  Appropriate  Affect:  Appropriate  Cognitive:  Appropriate  Insight: Appropriate  Engagement in Group:  Engaged  Modes of Intervention:  Discussion  Additional Comments:The patient expressed that she learned in group to be more positive and less negative.The patient also learned to better coping skills.  Nash Shearer 01/23/2014, 9:56 PM

## 2014-01-23 NOTE — Progress Notes (Signed)
Psychoeducational Group Note  Date:  01/23/2014 Time:  1015  Group Topic/Focus:  Making Healthy Choices:   The focus of this group is to help patients identify negative/unhealthy choices they were using prior to admission and identify positive/healthier coping strategies to replace them upon discharge.  Participation Level:  Active  Participation Quality:  Appropriate  Affect:  Appropriate  Cognitive:  Oriented  Insight:  Improving  Engagement in Group:  Engaged  Additional Comments:    Paulino Rily 01/23/2014

## 2014-01-23 NOTE — Progress Notes (Signed)
D) Pt stated today that she was having thoughts of wanting to hurt herself. Rates her depression and hopelessness both at an 8. States "I feel like there is no hope for me. I feel like I'll be going into the same situation and will just get severely depressed again" States that she feels she doesn't have any strengths in her life except for her two children. Has been more withdrawn this shift, staying to herself when not in group. Also someone has to go to her room for Pt to come to the groups. A) Given support, reassurance and appropriate praise. Provided with a 1:1. R) Pt is unable, at this point to see any positives in her life. Contracts for safety on the unit.

## 2014-01-23 NOTE — [facility] Group Notes (Signed)
[facility] LCSW Group Therapy Note   01/23/2014  1:15 To 2:15 PM   Type of Therapy and Topic: Group Therapy: Feelings Around Returning Home & Establishing a Supportive Framework and Activity to Identify signs of Improvement or Decompensation   Participation Level: Minimal  Mood: hesitant  Description of Group:  Patients first processed thoughts and feelings about up coming discharge. These included fears of upcoming changes, lack of change, new living environments, judgements and expectations from others and overall stigma of MH issues. We then discussed what is a supportive framework? What does it look like feel like and how do I discern it from and unhealthy non-supportive network? Learn how to cope when supports are not helpful and don't support you. Discuss what to do when your family/friends are not supportive.   Therapeutic Goals Addressed in Processing Group:  1. Patient will identify one healthy supportive network that they can use at discharge. 2. Patient will identify one factor of a supportive framework and how to tell it from an unhealthy network. 3. Patient able to identify one coping skill to use when they do not have positive supports from others. 4. Patient will demonstrate ability to communicate their needs through discussion and/or role plays.  Summary of Patient Progress:  Pt hesitant to engage during group session. As others processed their anxiety about discharge and described healthy supports. [name] choose to write in journal.  When asked what she might do differently upon discharge patient named a few things but listed reasons why nothing would work. For example "spend time with other, but it is hard form me because of difficulty leaving house." When others made suggestion to invite friends to her home she stated they were to busy to visit. Pt appeared to step out of group to avoid more interaction with group members.   Mackenzie Pigeon, LCSW

## 2014-01-23 NOTE — Progress Notes (Signed)
Psychoeducational Group Note  Date: 01/23/2014 Time: 0930  Group Topic/Focus:  Gratefulness:  The focus of this group is to help patients identify what two things they are most grateful for in their lives. What helps ground them and to center them on their work to their recovery.  Participation Level:  Active  Participation Quality:  Attentive  Affect:  Appropriate  Cognitive:  Appropriate  Insight:  Improving  Engagement in Group:  Engaged  Additional Comments:    [name] A  

## 2014-01-24 DIAGNOSIS — F329 Major depressive disorder, single episode, unspecified: Secondary | ICD-10-CM

## 2014-01-24 DIAGNOSIS — F429 Obsessive-compulsive disorder, unspecified: Secondary | ICD-10-CM

## 2014-01-24 DIAGNOSIS — F410 Panic disorder [episodic paroxysmal anxiety] without agoraphobia: Secondary | ICD-10-CM

## 2014-01-24 DIAGNOSIS — F1320 Sedative, hypnotic or anxiolytic dependence, uncomplicated: Secondary | ICD-10-CM

## 2014-01-24 DIAGNOSIS — F603 Borderline personality disorder: Secondary | ICD-10-CM

## 2014-01-24 LAB — GLUCOSE, CAPILLARY: GLUCOSE-CAPILLARY: 170 mg/dL — AB (ref 70–99)

## 2014-01-24 MED ORDER — BUSPIRONE HCL 7.5 MG PO TABS: 7.5000 mg | ORAL_TABLET | Freq: Two times a day (BID) | ORAL | Status: DC

## 2014-01-24 MED ORDER — CITALOPRAM HYDROBROMIDE 20 MG PO TABS: 20.0000 mg | ORAL_TABLET | Freq: Every day | ORAL | Status: DC

## 2014-01-24 MED ORDER — CLONAZEPAM 0.5 MG PO TABS: 0.5000 mg | ORAL_TABLET | Freq: Three times a day (TID) | ORAL | Status: DC

## 2014-01-24 MED ORDER — GUAIFENESIN ER 600 MG PO TB12: 1200.0000 mg | ORAL_TABLET | Freq: Two times a day (BID) | ORAL | Status: DC

## 2014-01-24 MED ORDER — ARIPIPRAZOLE 5 MG PO TABS: 5.0000 mg | ORAL_TABLET | Freq: Two times a day (BID) | ORAL | Status: DC

## 2014-01-24 NOTE — Progress Notes (Signed)
[facility] Adult Case Management Discharge Plan :  Will you be returning to the same living situation after discharge: No, patient to discharge to mother's home. At discharge, do you have transportation home?:Yes,  Mother to transport patient home. Do you have the ability to pay for your medications:Yes,  Patient is able to afford medications.  Release of information consent forms completed and in the chart;  Patient's signature needed at discharge.  Patient to Follow up at: Follow-up Information   Follow up with Dr. Harrington Challenger On 01/31/2014. (Monday, January 31, 2014 at 8:30 AM)    Contact information:   [address] [place], [place]      Follow up with Maurice Small  On 02/04/2014. (Friday, February 04, 2014 at 1:00 PM)    Contact information:   [address] [place], [place]  [address]      Patient denies SI/HI:  Patient no longer endorsing SI/HI or other thoughts of self harm.   Safety Planning and Suicide Prevention discussed:  .Reviewed with all patients during discharge planning group   Gisele Pack, Eulas Post 01/24/2014, 10:51 AM

## 2014-01-24 NOTE — Progress Notes (Signed)
Pt attended spiritual care group on grief and loss facilitated by chaplain Jerene Pitch.  Group opened with brief discussion and psycho-social ed around grief and loss in relationships and in relation to self - identifying life patterns, circumstances, changes that cause losses. Established group norm of speaking from own life experience. Group goal of establishing open and affirming space for members to share loss and experience with grief, normalize grief experience and provide psycho social education and grief support.    [name] was present and attentive in group.  Supportive of peers.  Affect was appropriate.  In supporting another member of group, [name] stated that her grief has not "gone away" and normalized that grief will always be present in a variety of ways.   [place], [place]

## 2014-01-24 NOTE — [facility] Suicide Risk Assessment (Signed)
Suicide Risk Assessment  Discharge Assessment     Demographic Factors:  Adolescent or young adult, Caucasian, Low socioeconomic status and Unemployed  Mental Status Per Nursing Assessment::   On Admission:  Suicidal ideation indicated by patient  Current Mental Status by Physician: Patient is calm and cooperative. Patient has normal psychomotor activity. Patient has fine mood with the appropriate and bright affect. Patient has normal speech and thought process. Patient has no suicidal, homicidal ideation, intention or plans. Patient has no evidence of psychotic symptoms.  Loss Factors: Financial problems/change in socioeconomic status  Historical Factors: Prior suicide attempts and Family history of mental illness or substance abuse  Risk Reduction Factors:   Sense of responsibility to family, Religious beliefs about death, Living with another person, especially a relative, Positive social support, Positive therapeutic relationship and Positive coping skills or problem solving skills  Continued Clinical Symptoms:  Depression:   Recent sense of peace/wellbeing Obsessive-Compulsive Disorder Unstable or Poor Therapeutic Relationship Previous Psychiatric Diagnoses and Treatments Medical Diagnoses and Treatments/Surgeries  Cognitive Features That Contribute To Risk:  Polarized thinking    Suicide Risk:  Minimal: No identifiable suicidal ideation.  Patients presenting with no risk factors but with morbid ruminations; may be classified as minimal risk based on the severity of the depressive symptoms  Discharge Diagnoses:   AXIS I:  Major Depression, Recurrent severe, Obsessive Compulsive Disorder, Panic Disorder and Substance Induced Mood Disorder AXIS II:  Borderline Personality Dis. AXIS III:   Past Medical History  Diagnosis Date  . Hypertension   . Diabetes mellitus   . Arthritis   . Anxiety   . Headache(784.0)   . History of borderline personality disorder   .  Hyperlipidemia   . Depression   . Personality disorder    AXIS IV:  other psychosocial or environmental problems, problems related to social environment and problems with primary support group AXIS V:  61-70 mild symptoms  Plan Of Care/Follow-up recommendations:  Activity: As tolerated, diet: regular  Is patient on multiple antipsychotic therapies at discharge:  No   Has Patient had three or more failed trials of antipsychotic monotherapy by history:  No  Recommended Plan for Multiple Antipsychotic Therapies: NA  Ixchel Duck,JANARDHAHA R. 01/24/2014, 12:57 PM

## 2014-01-24 NOTE — Progress Notes (Signed)
Recreation Therapy Notes  Date: 01.26.2015 Time: 2:45pm Location: [address] Day[address]   Group Topic: Wellness  Goal Area(s) Addresses:  Patient will identify dimension of wellness they most struggle with.  Patient will identify at least 2 ways to invest in that type of wellness.   Behavioral Response: Did not attend. Patient preparing for d/c during recreation therapy group session.   Laureen Ochs Zerline Melchior, LRT/CTRS  Lane Hacker 01/24/2014 4:46 PM

## 2014-01-24 NOTE — Tx Team (Signed)
Interdisciplinary Treatment Plan Update   Date Reviewed:  01/24/2014  Time Reviewed:  9:47 AM  Progress in Treatment:   Attending groups: Yes Participating in groups: Yes Taking medication as prescribed: Yes  Tolerating medication: Yes Family/Significant other contact made: No, patient declined collateral contact. Patient understands diagnosis: Yes  Discussing patient identified problems/goals with staff: Yes Medical problems stabilized or resolved: Yes Denies suicidal/homicidal ideation: Yes Patient has not harmed self or others: Yes  For review of initial/current patient goals, please see plan of care.  Estimated Length of Stay:   Discharge today  Reasons for Continued Hospitalization:    New Problems/Goals identified:    Discharge Plan or Barriers:   Home with outpatient follow up with [facility]  Additional Comments:  N/A  Attendees:  Patient:  [name] 01/24/2014 9:47 AM   Signature: Mylinda Latina, MD 01/24/2014 9:47 AM  Signature:   01/24/2014 9:47 AM  Signature:  Catalina Pizza, NP 01/24/2014 9:47 AM  Signature:Beverly Danelle Earthly, RN 01/24/2014 9:47 AM  Signature:  Thurnell Garbe RN 01/24/2014 9:47 AM  Signature:  Joette Catching, LCSW 01/24/2014 9:47 AM  Signature:  Regan Lemming, LCSW 01/24/2014 9:47 AM  Signature:  Lucinda Dell, Care Coordinator 01/24/2014 9:47 AM  Signature:   01/24/2014 9:47 AM  Signature:  01/24/2014  9:47 AM  Signature:   Lars Pinks, RN [facility] 01/24/2014  9:47 AM  Signature:   01/24/2014  9:47 AM    Scribe for Treatment Team:   Joette Catching,  01/24/2014 9:47 AM

## 2014-01-24 NOTE — Progress Notes (Signed)
D:  Patient's self inventory sheet, patient needs sleep medications, good appetite, low energy level, good attention span.  Rated depression and anxiety 5, hopeless 3.  Denied withdrawals.  Denied SI.  Denied physical problems.  Plans to go to new therapist after discharge.  No problems taking meds after discharge. A:  Medications administered per MD orders.  Emotional support and encouragement given patient. R:  Denied SI and HI.  Denied A/V hallucinations.  Denied pain.  Will continue to monitor patient for safety with 15 minute checks.  Safety maintained.
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
## 2014-01-24 NOTE — [facility] Group Notes (Signed)
[facility] LCSW Aftercare Discharge Planning Group Note   01/24/2014 9:59 AM    Participation Quality:  Appropraite  Mood/Affect:  Appropriate  Depression Rating:  3  Anxiety Rating:  5  Thoughts of Suicide:  No  Will you contract for safety?   NA  Current AVH:  No  Plan for Discharge/Comments:  Patient attended discharge planning group and actively participated in group. She reports feeling much better and hopes to discharge home today.  She will follow up with [facility] in [place].   CSW provided all participants with daily workbook.   Transportation Means: Patient has transportation.   Supports:  Patient has a support system.   Seamus Warehime, Eulas Post

## 2014-01-24 NOTE — Progress Notes (Signed)
Discharge Note:  Patient discharge home with family.  Denied SI and HI.  Denied A/V hallucinations.  Denied pain.  Suicide prevention information given and discussed with patient who stated she understood and had no questions.  Patient received all her belongings, medications,  Prescriptions, clothing, miscellaneous items, toiletries, shirt, shoes, pocketbook, cellphone, cards, check book.  Patient stated she appreciated all assistance received from [facility] staff.

## 2014-01-27 ENCOUNTER — Ambulatory Visit (HOSPITAL_COMMUNITY)
Admission: RE | Admit: 2014-01-27 | Discharge: 2014-01-27 | Disposition: A | Discharge status: Home / Self Care | Payer: Medicare Other | Attending: Psychiatry | Admitting: Psychiatry

## 2014-01-27 NOTE — Progress Notes (Signed)
Patient Discharge Instructions:  Next Level Care Provider Has Access to the EMR, 01/27/17 Records provided to [facility] via [facility]/Epic access.  [name], 01/27/2014, 2:33 PM

## 2014-01-27 NOTE — BH Assessment (Signed)
Tele Assessment Note   [name] is an 48 y.o. female. She presents to [facility] as a walk in. She is accompanied by her 76 yr old mother. Sts that she is suicidal and "doesn't know what to do". She was discharged from [facility] Monday 01/24/2014 after a 5 day admission for similar complaints (suicidal). Pt also hospitalized in the past several other times here at [facility] for similar complaints. Today she reports having impulsive thoughts of overdosing. She has access to prescription medications. Patient sts she doesn't feel this way all the time but the "suicidal thoughts come and go". Says she felt ok this morning, took her daughter to school, took a nap, and woke up suicidal without reason (no trigger). She thinks that she was released to early from [facility]. Pt sts that she feels depressed with the following symptoms: hopelessness, fatigue, tearful, guilt, and isolation. She also reports vegetative symptoms such as not showering. She denies HI and AVH's. No alcohol or drug use.   Patient has a out patient psychiatrist at the [facility]) outpatient office Dr. Ross-psychiatrist and Vickii Chafe Bynum-therapist.   Her mom request that patient comes back in as a patient for several weeks until her medications get in her system. Writer informed mom that this is a acute/crises/stablization facility and patient's needing such services are often transferred to a more accommodating facility(e.g. [facility] in [place]). Patient agrees that she may need to be somewhere for months or a ALF as she is unable to function. Pt's mom then suggested keeping her in the ED over the weekend to be watched. Writer re-interated to patient's mother that ALF placement may be an option, however; sitting in the ED over the weekend to be watched would not be a option. Patient was told that if she went to the ED she would be their with intent to be placed in a psychiatric facility and evaluated daily by a extender/psychiatrist.   Writer discussed the above  clinicals with Dr. Jessy Oto and AC-Eric. Dr. Louretta Shorten was given all the above information. He sts that patient is chronic and inpatient will not help her as she just left. Additionally, she was admitted previously with similar complaints and no relief. He suggested IOP at this time. However, sts that this is the plan if patient is able to contract for safety. In the event that she is unable to contract for safety she will be sent to the ED for placement at [facility] for long term placement. Writer discussed this disposition plan with patient and she agreed to the following:  1. Go home with her mother Friday 01-28-2014 to Monday 01-31-2014 for safety reasons/observation. Sister will also assist in observing patient and keeping her safe. Mom and sister will remove all medications from the home and patient's possession. They will give her prescription medications as prescribed. Pt will not have access. 2. Monday 01-31-2014 attend her appointment with her psychiatrist at the [facility] patient clinic in [facility]- Dr. Harrington Challenger at 8:30am. Writer called and spoke to OJ to confirm this appointment and to also to see if their were any sooner appointments. OJ sts that their are no sooner appointments so patient will have to keep her current appointment. 3. Start Psych IOP with Dellia Nims Tuesday 02-01-2014. Present at 8:45am on that first day.  4. Attend Psych IOP for the remainder of the week.  5. Friday 02-04-2014 attend her appointment with her therapis at the [facility] patient clinic in [place] at 1:00pm.  Writer called and spoke to OJ to confirm this appointment and to also to see if their were any sooner appointments. OJ sts that their are no sooner appointments so patient will have to keep her current appointment.  Patient understands the above plan and agrees. She contracts for safety. She understands that if she feels worse she will present to the [facility] Emergency  Room for placement at [facility] or any other long term facility that may be able to meet her needs.   Axis I: Major Depression, Recurrent severe and Anxiety Disorder Nos Axis II: Deferred Axis III:  Past Medical History  Diagnosis Date  . Hypertension   . Diabetes mellitus   . Arthritis   . Anxiety   . Headache(784.0)   . History of borderline personality disorder   . Hyperlipidemia   . Depression   . Personality disorder    Axis IV: other psychosocial or environmental problems, problems related to social environment, problems with access to health care services and problems with primary support group Axis V: 31-40 impairment in reality testing  Past Medical History:  Past Medical History  Diagnosis Date  . Hypertension   . Diabetes mellitus   . Arthritis   . Anxiety   . Headache(784.0)   . History of borderline personality disorder   . Hyperlipidemia   . Depression   . Personality disorder     Past Surgical History  Procedure Laterality Date  . Foot surgery    . Total abdominal hysterectomy w/ bilateral salpingoophorectomy      Family History:  Family History  Problem Relation Age of Onset  . Depression Mother   . OCD Other   . ADD / ADHD Neg Hx   . Alcohol abuse Neg Hx   . Drug abuse Neg Hx   . Anxiety disorder Neg Hx   . Bipolar disorder Neg Hx   . Dementia Neg Hx   . Paranoid behavior Neg Hx   . Schizophrenia Neg Hx   . Seizures Neg Hx   . Sexual abuse Neg Hx   . Physical abuse Neg Hx   . Ovarian cancer Sister   . Cirrhosis Sister     Social History:  reports that she has never smoked. She has never used smokeless tobacco. She reports that she does not drink alcohol or use illicit drugs.  Additional Social History:  Alcohol / Drug Use Pain Medications: SEE MAR Prescriptions: SEE MAR Over the Counter: SEE MAR History of alcohol / drug use?: No history of alcohol / drug abuse  CIWA: CIWA-Ar BP: 117/72 mmHg Pulse Rate: 98 COWS:    Allergies:   Allergies  Allergen Reactions  . Neurontin [Gabapentin] Other (See Comments)    THIRTY lb wt gain  . Lamictal [Lamotrigine] Other (See Comments)    At any dose higher than 50 mg once a day experiences dizziness, panic, and headaches.   . Trazodone And Nefazodone     Caused insomnia    Home Medications:  (Not in a hospital admission)  OB/GYN Status:  No LMP recorded. Patient is postmenopausal.  General Assessment Data Location of Assessment: [facility] Assessment Services Is this a Tele or Face-to-Face Assessment?: Face-to-Face Is this an Initial Assessment or a Re-assessment for this encounter?: Initial Assessment Living Arrangements: Children;Other (Comment) (35 yrs old daughter) Can pt return to current living arrangement?: Yes Admission Status: Voluntary Is patient capable of signing voluntary admission?: Yes Transfer from: [facility] Referral Source: Self/Family/Friend     [facility]  Crisis Care Plan Living Arrangements: Children;Other (Comment) (68 yrs old daughter) Name of Psychiatrist:  (Dr. Harrington Challenger- Psychiatrist at the [facility]) Name of Therapist:  (Peggy Bynum-Therapist @ [facility])  Education Status Is patient currently in school?: No  Risk to self Suicidal Ideation: Yes-Currently Present Suicidal Intent: Yes-Currently Present Is patient at risk for suicide?: Yes Suicidal Plan?: Yes-Currently Present Specify Current Suicidal Plan:  (overdose ) Access to Means: Yes Specify Access to Suicidal Means:  (pt reports that she prescription medications on ) What has been your use of drugs/alcohol within the last 12 months?:  (patient denies alcohol and drug use) Previous Attempts/Gestures: Yes How many times?:  (Multiple overdoses) Other Self Harm Risks:  (None reported ) Triggers for Past Attempts: Other (Comment) (depression; anxiety; agoraphobia) Intentional Self Injurious Behavior: None Family Suicide History: No Recent  stressful life event(s): Other (Comment) (can't care for daughter; "released from [facility] soon" ) Persecutory voices/beliefs?: No Depression: Yes Depression Symptoms: Feeling angry/irritable;Feeling worthless/self pity;Loss of interest in usual pleasures;Guilt;Fatigue;Isolating;Insomnia;Tearfulness;Despondent Substance abuse history and/or treatment for substance abuse?: No Suicide prevention information given to non-admitted patients: Not applicable  Risk to Others Homicidal Ideation: No Thoughts of Harm to Others: No Current Homicidal Intent: No Current Homicidal Plan: No Access to Homicidal Means: No Identified Victim:  (n/a) History of harm to others?: No Assessment of Violence: None Noted Violent Behavior Description:  (patient is calm and cooperative ) Does patient have access to weapons?: No Criminal Charges Pending?: No Does patient have a court date: No  Psychosis Hallucinations: None noted Delusions: None noted  Mental Status Report Appear/Hygiene: Disheveled Eye Contact: Good Motor Activity: Freedom of movement Speech: Soft Level of Consciousness: Alert Mood: Depressed Affect: Anxious;Depressed Anxiety Level: None Panic attack frequency:  (patient reports ongoing issues with anxiety and panic attack) Most recent panic attack:  (today; daily panic attacks) Thought Processes: Coherent Judgement: Unimpaired Orientation: Person;Time;Situation;Place Obsessive Compulsive Thoughts/Behaviors: None  Cognitive Functioning Concentration: Decreased Memory: Recent Intact;Remote Intact IQ: Average Insight: Fair Impulse Control: Fair Appetite: Fair Weight Loss:  (none reported ) Weight Gain:  (none reported ) Sleep: Decreased Total Hours of Sleep:  (varies; sleeps during the day mostly up all night) Vegetative Symptoms: None  ADLScreening [facility] Assessment Services) Patient's cognitive ability adequate to safely complete daily activities?: Yes Patient able to express  need for assistance with ADLs?: Yes Independently performs ADLs?: Yes (appropriate for developmental age)  Prior Inpatient Therapy Prior Inpatient Therapy: Yes Prior Therapy Dates:  ([facility]-Several times; released 01/24/14) Prior Therapy Facilty/Provider(s):  [facility]) Reason for Treatment:  (depression, suicidal ideations/attempts, anxiety)  Prior Outpatient Therapy Prior Outpatient Therapy: Yes Prior Therapy Dates:  (current) Prior Therapy Facilty/Provider(s):  (Dr. Harrington Challenger (Psychiatrist); Peggy Bynum-therapist ) Reason for Treatment:  (med mgmt. and therapy)  ADL Screening (condition at time of admission) Patient's cognitive ability adequate to safely complete daily activities?: Yes Is the patient deaf or have difficulty hearing?: No Does the patient have difficulty seeing, even when wearing glasses/contacts?: No Does the patient have difficulty concentrating, remembering, or making decisions?: No Patient able to express need for assistance with ADLs?: Yes Does the patient have difficulty dressing or bathing?: No Independently performs ADLs?: Yes (appropriate for developmental age) Does the patient have difficulty walking or climbing stairs?: No Weakness of Legs: None Weakness of Arms/Hands: None  Home Assistive Devices/Equipment Home Assistive Devices/Equipment: None    Abuse/Neglect Assessment (Assessment to be complete while patient is alone) Physical Abuse: Denies Verbal Abuse: Denies Sexual Abuse: Denies  Exploitation of patient/patient's resources: Denies Self-Neglect: Denies Values / Beliefs Cultural Requests During Hospitalization: None Spiritual Requests During Hospitalization: None   Advance Directives (For Healthcare) Advance Directive: Patient does not have advance directive Nutrition Screen- [place] Adult/[facility]/AP Patient's home diet: Regular  Additional Information 1:1 In Past 12 Months?: No CIRT Risk: No Elopement Risk: No Does patient have medical clearance?: Yes      Disposition:  Disposition Initial Assessment Completed for this Encounter: Yes Disposition of Patient: Other dispositions;Referred to (Psych IOP 02/01/14; Psych Appt.01/31/14; Therapy Appt.02/04/14) Type of inpatient treatment program: Adult Other disposition(s): Other (Comment) (start Psych IOP & f/u with outpt provideres ) Patient referred to: Other (Comment) (Psych IOP-[facility]; Outpt providers-[place])  Waldon Merl [facility] 01/27/2014 4:50 PM

## 2014-01-31 ENCOUNTER — Encounter (HOSPITAL_COMMUNITY): Payer: Self-pay | Admitting: Psychiatry

## 2014-01-31 ENCOUNTER — Ambulatory Visit (INDEPENDENT_AMBULATORY_CARE_PROVIDER_SITE_OTHER): Payer: Medicare Other | Admitting: Psychiatry

## 2014-01-31 VITALS — BP 130/80 | Ht 64.0 in | Wt 211.0 lb

## 2014-01-31 DIAGNOSIS — F329 Major depressive disorder, single episode, unspecified: Secondary | ICD-10-CM | POA: Diagnosis not present

## 2014-01-31 MED ORDER — ARIPIPRAZOLE 2 MG PO TABS: 2.0000 mg | ORAL_TABLET | Freq: Every day | ORAL | Status: DC

## 2014-01-31 MED ORDER — VENLAFAXINE HCL ER 150 MG PO CP24: 150.0000 mg | ORAL_CAPSULE | Freq: Every day | ORAL | Status: DC

## 2014-01-31 MED ORDER — HYDROXYZINE PAMOATE 25 MG PO CAPS: 25.0000 mg | ORAL_CAPSULE | Freq: Three times a day (TID) | ORAL | Status: DC | PRN
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
## 2014-02-02 ENCOUNTER — Telehealth (HOSPITAL_COMMUNITY): Payer: Self-pay | Admitting: *Deleted

## 2014-02-02 ENCOUNTER — Other Ambulatory Visit (HOSPITAL_COMMUNITY): Payer: Medicare Other | Attending: Psychiatry | Admitting: Psychiatry

## 2014-02-02 ENCOUNTER — Encounter (HOSPITAL_COMMUNITY): Payer: Self-pay

## 2014-02-02 DIAGNOSIS — F331 Major depressive disorder, recurrent, moderate: Secondary | ICD-10-CM

## 2014-02-02 DIAGNOSIS — F329 Major depressive disorder, single episode, unspecified: Secondary | ICD-10-CM | POA: Diagnosis not present

## 2014-02-02 DIAGNOSIS — F4001 Agoraphobia with panic disorder: Secondary | ICD-10-CM

## 2014-02-02 DIAGNOSIS — R45851 Suicidal ideations: Secondary | ICD-10-CM | POA: Diagnosis not present

## 2014-02-02 DIAGNOSIS — I10 Essential (primary) hypertension: Secondary | ICD-10-CM | POA: Diagnosis not present

## 2014-02-02 DIAGNOSIS — E785 Hyperlipidemia, unspecified: Secondary | ICD-10-CM | POA: Diagnosis not present

## 2014-02-02 DIAGNOSIS — F603 Borderline personality disorder: Secondary | ICD-10-CM | POA: Diagnosis not present

## 2014-02-02 DIAGNOSIS — E119 Type 2 diabetes mellitus without complications: Secondary | ICD-10-CM | POA: Diagnosis not present

## 2014-02-02 DIAGNOSIS — F411 Generalized anxiety disorder: Secondary | ICD-10-CM | POA: Diagnosis not present
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
## 2014-02-02 NOTE — Telephone Encounter (Signed)
noted 
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
## 2014-02-03 ENCOUNTER — Other Ambulatory Visit (HOSPITAL_COMMUNITY): Payer: Medicare Other | Attending: Psychiatry | Admitting: Psychiatry

## 2014-02-03 ENCOUNTER — Encounter (HOSPITAL_COMMUNITY): Payer: Self-pay | Admitting: Psychiatry

## 2014-02-03 DIAGNOSIS — F603 Borderline personality disorder: Secondary | ICD-10-CM | POA: Insufficient documentation

## 2014-02-03 DIAGNOSIS — E785 Hyperlipidemia, unspecified: Secondary | ICD-10-CM | POA: Insufficient documentation

## 2014-02-03 DIAGNOSIS — F411 Generalized anxiety disorder: Secondary | ICD-10-CM | POA: Insufficient documentation

## 2014-02-03 DIAGNOSIS — F332 Major depressive disorder, recurrent severe without psychotic features: Secondary | ICD-10-CM | POA: Insufficient documentation

## 2014-02-03 DIAGNOSIS — F339 Major depressive disorder, recurrent, unspecified: Secondary | ICD-10-CM

## 2014-02-03 DIAGNOSIS — E119 Type 2 diabetes mellitus without complications: Secondary | ICD-10-CM | POA: Insufficient documentation

## 2014-02-03 DIAGNOSIS — I10 Essential (primary) hypertension: Secondary | ICD-10-CM | POA: Insufficient documentation
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
## 2014-02-04 ENCOUNTER — Other Ambulatory Visit (HOSPITAL_COMMUNITY): Payer: Medicare Other

## 2014-02-04 ENCOUNTER — Ambulatory Visit (HOSPITAL_COMMUNITY): Payer: Self-pay | Admitting: Psychiatry

## 2014-02-07 ENCOUNTER — Other Ambulatory Visit (HOSPITAL_COMMUNITY): Payer: Medicare Other | Admitting: Psychiatry

## 2014-02-07 ENCOUNTER — Encounter (HOSPITAL_COMMUNITY): Payer: Self-pay | Admitting: Psychiatry

## 2014-02-07 DIAGNOSIS — F603 Borderline personality disorder: Secondary | ICD-10-CM | POA: Diagnosis not present

## 2014-02-07 DIAGNOSIS — F332 Major depressive disorder, recurrent severe without psychotic features: Secondary | ICD-10-CM | POA: Diagnosis not present

## 2014-02-07 DIAGNOSIS — F339 Major depressive disorder, recurrent, unspecified: Secondary | ICD-10-CM

## 2014-02-07 DIAGNOSIS — I10 Essential (primary) hypertension: Secondary | ICD-10-CM | POA: Diagnosis not present

## 2014-02-07 DIAGNOSIS — E119 Type 2 diabetes mellitus without complications: Secondary | ICD-10-CM | POA: Diagnosis not present

## 2014-02-07 DIAGNOSIS — F411 Generalized anxiety disorder: Secondary | ICD-10-CM | POA: Diagnosis not present

## 2014-02-07 DIAGNOSIS — E785 Hyperlipidemia, unspecified: Secondary | ICD-10-CM | POA: Diagnosis not present

## 2014-02-07 NOTE — Progress Notes (Signed)
    Daily Group Progress Note  Program: IOP  Group Time:  9:00-10:30  Participation Level: Active  Behavioral Response: Appropriate and Sharing  Type of Therapy:  Group Therapy  Summary of Progress: Pt. Reported ok/ mood during morning check-in. Pt. Participated in heartmath meditation. Pt. Reports that she was lonely over the weekend and was challenged to find energy motivation, but was able to do laundry and shower. Pt. Reports separation from her daughter over the weekends as a significant trigger for her depression.     Group Time: 10:30-12:00  Participation Level:  Active  Behavioral Response: Appropriate and Sharing  Type of Therapy: Psycho-education Group  Summary of Progress: Pt. Participated in grief/loss group facilitated by Jeanella Craze.  Nancie Neas, COUNS

## 2014-02-08 ENCOUNTER — Other Ambulatory Visit (HOSPITAL_COMMUNITY): Payer: Medicare Other | Admitting: Psychiatry

## 2014-02-09 ENCOUNTER — Other Ambulatory Visit (HOSPITAL_COMMUNITY): Payer: Medicare Other

## 2014-02-10 ENCOUNTER — Other Ambulatory Visit (HOSPITAL_COMMUNITY): Payer: Medicare Other | Admitting: Psychiatry

## 2014-02-11 ENCOUNTER — Other Ambulatory Visit (HOSPITAL_COMMUNITY): Payer: Medicare Other | Admitting: Psychiatry

## 2014-02-11 ENCOUNTER — Encounter (HOSPITAL_COMMUNITY): Payer: Self-pay | Admitting: Psychiatry

## 2014-02-11 DIAGNOSIS — F339 Major depressive disorder, recurrent, unspecified: Secondary | ICD-10-CM

## 2014-02-11 DIAGNOSIS — E119 Type 2 diabetes mellitus without complications: Secondary | ICD-10-CM | POA: Diagnosis not present

## 2014-02-11 DIAGNOSIS — E785 Hyperlipidemia, unspecified: Secondary | ICD-10-CM | POA: Diagnosis not present

## 2014-02-11 DIAGNOSIS — F603 Borderline personality disorder: Secondary | ICD-10-CM | POA: Diagnosis not present

## 2014-02-11 DIAGNOSIS — I10 Essential (primary) hypertension: Secondary | ICD-10-CM | POA: Diagnosis not present

## 2014-02-11 DIAGNOSIS — F332 Major depressive disorder, recurrent severe without psychotic features: Secondary | ICD-10-CM | POA: Diagnosis not present

## 2014-02-11 DIAGNOSIS — F411 Generalized anxiety disorder: Secondary | ICD-10-CM | POA: Diagnosis not present

## 2014-02-11 NOTE — Progress Notes (Signed)
Patient ID: [name], female   DOB: 1966/08/29, 48 y.o.   MRN: [id number]   Group Time: 9:00-10:30   Participation Level: Active   Behavioral Response: Appropriate and Sharing   Type of Therapy: Group Therapy   Summary of Progress: Pt. Participated in heartmath meditation. Pt. Participated in discussion about identifying sources of anger, normalizing anger, and identifying the lessons/self-awareness that is available during periods of anger.  Group Time: 10:30-12:00   Participation Level: Active   Behavioral Response: Appropriate and Sharing   Type of Therapy: Psycho-education Group   Summary of Progress: Pt. Participated in group focused on identifying and countering negative core beliefs. Nancie Neas, COUNS

## 2014-02-11 NOTE — Progress Notes (Signed)
Patient ID: [name], female   DOB: 10/06/1966, 48 y.o.   MRN: [id number] D: This is a 48 yr old, divorced, Caucasian female, who was transitioned from the inpatient unit. Pt was admitted due to SI with a plan (OD). Currently denies a plan or intent, but continues to struggle with SI at times.  Today pt denied SI.   Pt has had several past suicide attempts (overdoses). Denies any self mutilating behaviors. Has been admitted ten times (psychiatric). Pt is inquiring if she needs to go back to a state facility. Discussed safety options with pt at length. Pt is able to contract for safety. Pt reports a hx of agoraphobia. Denies HI or A/V hallucinations.  Family hx: Pt's mother has had a hx of depression. Multiple stressors: 1) Caregiver for 72 yo daughter. 2) Unresolved grief/loss issues: Sister died ~ three years ago due to ovarian cancer. -Divorce was finalized in December 2013. -Relationship pt was in for eight months ended recently. Been on disability due to depression, anxiety, migraines since 2010. -Son moved in with his father two years ago.  Pt has only attended [facility] for four days.  Pt reports she is too anxious to drive from [place] to attend daily.  Writer offered for pt to attend every other day, but pt continues to state that it's causing her too much anxiety.  Pt is requesting individual counseling with Peggy Bynum, LCSW.  Pt states she feels better.  "I feel more hopeful.  I want to continue working on my grief and anger."  A:  D/C pt today.  Will follow up with Dr. Harrington Challenger on 02-22-14 @ 9:30 a.m.  Pt will discuss with Dr. Sima Matas about switching to Maurice Small, LCSW for therapy.  Encouraged support groups.  R:  Pt receptive.

## 2014-02-11 NOTE — Patient Instructions (Addendum)
Pt is requesting discharge from [facility].  Patient to follow up with Dr. Harrington Challenger 02-22-14 @ 9:30 a.m and Maurice Small, LCSW.  Pt will call the office for an appointment with Peggy Bynum, LCSW.  Encourage support groups.

## 2014-02-14 ENCOUNTER — Other Ambulatory Visit (HOSPITAL_COMMUNITY): Payer: Medicare Other

## 2014-02-15 ENCOUNTER — Other Ambulatory Visit (HOSPITAL_COMMUNITY): Payer: Medicare Other

## 2014-02-16 ENCOUNTER — Other Ambulatory Visit (HOSPITAL_COMMUNITY): Payer: Medicare Other | Admitting: Psychiatry

## 2014-02-17 ENCOUNTER — Other Ambulatory Visit (HOSPITAL_COMMUNITY): Payer: Medicare Other

## 2014-02-17 NOTE — Progress Notes (Signed)
Discharge Note  Patient:  [name] is an 48 y.o., female DOB:  03/19/1966  Date of Admission: 2 /4/15  Date of Discharge:  02/11/14  Reason for Admission: Depression and anxiety  Hospital Course: Patient started IOP and her medications were continued at the current doses. Patient was only able to attend for 4 days because she was very anxious driving back and forth from [place] to attend IOP in [place]. Options were offered to try 3 days of the but patient decided she did not want to accept that option. Patient stated that she won't return back to see Dr. Harrington Challenger and her therapist Maurice Small.  Mental Status at Discharge: Alert, oriented x3, affect is constricted mood is dysphoric and very anxious, speech and language are normal. Musculoskeletal is normal. No suicidal or homicidal ideation and no hallucinations or delusions. Recent and remote memory is good, judgment and insight is good, concentration and recall are fair  Lab Results: No results found for this or any previous visit (from the past 48 hour(s)).  Current outpatient prescriptions:ARIPiprazole (ABILIFY) 2 MG tablet, Take 1 tablet (2 mg total) by mouth daily., Disp: 30 tablet, Rfl: 2;  clonazePAM (KLONOPIN) 0.5 MG tablet, Take 1 tablet (0.5 mg total) by mouth 3 (three) times daily., Disp: 15 tablet, Rfl: 0;  estradiol (ESTRACE) 2 MG tablet, Take 1 tablet (2 mg total) by mouth every morning., Disp: , Rfl:  hydrOXYzine (VISTARIL) 25 MG capsule, Take 1 capsule (25 mg total) by mouth 3 (three) times daily as needed., Disp: 90 capsule, Rfl: 1;  lisinopril (PRINIVIL,ZESTRIL) 20 MG tablet, Take 1 tablet (20 mg total) by mouth every morning., Disp: , Rfl: ;  medroxyPROGESTERone (PROVERA) 5 MG tablet, Take 0.5 tablets (2.5 mg total) by mouth every morning., Disp: , Rfl:  metFORMIN (GLUCOPHAGE) 500 MG tablet, Take 1 tablet (500 mg total) by mouth 2 (two) times daily with a meal. For diabetes control, Disp: , Rfl: ;  metoprolol tartrate  (LOPRESSOR) 25 MG tablet, Take 1 tablet (25 mg total) by mouth 2 (two) times daily. For hypertension, Disp: 60 tablet, Rfl: 0;  pravastatin (PRAVACHOL) 40 MG tablet, Take 40 mg by mouth daily., Disp: , Rfl:  venlafaxine XR (EFFEXOR-XR) 150 MG 24 hr capsule, Take 1 capsule (150 mg total) by mouth daily., Disp: 30 capsule, Rfl: 2;  venlafaxine XR (EFFEXOR-XR) 75 MG 24 hr capsule, Take 75 mg by mouth daily with breakfast., Disp: , Rfl: ;  [DISCONTINUED] FLUoxetine (PROZAC) 40 MG capsule, Take 40 mg by mouth daily.  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 Hypertension   . Diabetes mellitus   . Arthritis   . Anxiety   . Headache(784.0)   . History of borderline personality disorder   . Hyperlipidemia   . Depression   . Personality disorder    Axis IV: other psychosocial or environmental problems, problems related to social environment and problems with primary support group Axis V: 51-60 moderate symptoms   Level of Care:  OP  Discharge destination:  Home  Is patient on multiple antipsychotic therapies at discharge:  No    Has Patient had three or more failed trials of antipsychotic monotherapy by history:  No  Patient phone:  [phone] (home)  Patient address:   [address] [place] [address],   Follow-up recommendations:  Activity:  As tolerated Diet:  Regular Other:  Dr. Harrington Challenger for medications and Maurice Small for therapy  Comments:    The patient received suicide prevention pamphlet:  Yes   Erin Sons 02/17/2014, 3:45 PM

## 2014-02-18 ENCOUNTER — Other Ambulatory Visit (HOSPITAL_COMMUNITY): Payer: Medicare Other

## 2014-02-21 ENCOUNTER — Other Ambulatory Visit (HOSPITAL_COMMUNITY): Payer: Medicare Other

## 2014-02-22 ENCOUNTER — Ambulatory Visit (HOSPITAL_COMMUNITY): Payer: Self-pay | Admitting: Psychiatry

## 2014-02-23 ENCOUNTER — Other Ambulatory Visit (HOSPITAL_COMMUNITY): Payer: Medicare Other

## 2014-02-28 ENCOUNTER — Ambulatory Visit (INDEPENDENT_AMBULATORY_CARE_PROVIDER_SITE_OTHER): Payer: Medicare Other | Admitting: Psychiatry

## 2014-02-28 ENCOUNTER — Telehealth (HOSPITAL_COMMUNITY): Payer: Self-pay | Admitting: *Deleted

## 2014-02-28 ENCOUNTER — Encounter (HOSPITAL_COMMUNITY): Payer: Self-pay | Admitting: Psychiatry

## 2014-02-28 VITALS — BP 130/80 | Ht 64.0 in | Wt 211.0 lb

## 2014-02-28 DIAGNOSIS — F329 Major depressive disorder, single episode, unspecified: Secondary | ICD-10-CM

## 2014-02-28 DIAGNOSIS — F339 Major depressive disorder, recurrent, unspecified: Secondary | ICD-10-CM

## 2014-02-28 MED ORDER — VENLAFAXINE HCL ER 75 MG PO CP24: 75.0000 mg | ORAL_CAPSULE | Freq: Every day | ORAL | Status: DC

## 2014-02-28 MED ORDER — CLONAZEPAM 0.5 MG PO TABS: 0.5000 mg | ORAL_TABLET | Freq: Three times a day (TID) | ORAL | Status: DC
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
## 2014-03-01 ENCOUNTER — Ambulatory Visit (HOSPITAL_COMMUNITY): Payer: Self-pay | Admitting: Psychology

## 2014-03-04 ENCOUNTER — Telehealth (HOSPITAL_COMMUNITY): Payer: Self-pay | Admitting: *Deleted

## 2014-03-04 NOTE — Telephone Encounter (Signed)
Ok to release

## 2014-03-09 DIAGNOSIS — E119 Type 2 diabetes mellitus without complications: Secondary | ICD-10-CM | POA: Diagnosis not present

## 2014-03-09 DIAGNOSIS — E782 Mixed hyperlipidemia: Secondary | ICD-10-CM | POA: Diagnosis not present

## 2014-03-09 DIAGNOSIS — I10 Essential (primary) hypertension: Secondary | ICD-10-CM | POA: Diagnosis not present

## 2014-03-11 DIAGNOSIS — E119 Type 2 diabetes mellitus without complications: Secondary | ICD-10-CM | POA: Diagnosis not present

## 2014-03-11 DIAGNOSIS — I10 Essential (primary) hypertension: Secondary | ICD-10-CM | POA: Diagnosis not present

## 2014-03-11 DIAGNOSIS — F319 Bipolar disorder, unspecified: Secondary | ICD-10-CM | POA: Diagnosis not present

## 2014-03-11 DIAGNOSIS — E785 Hyperlipidemia, unspecified: Secondary | ICD-10-CM | POA: Diagnosis not present

## 2014-03-14 ENCOUNTER — Ambulatory Visit (INDEPENDENT_AMBULATORY_CARE_PROVIDER_SITE_OTHER): Payer: Medicare Other | Admitting: Psychiatry

## 2014-03-14 ENCOUNTER — Encounter (HOSPITAL_COMMUNITY): Payer: Self-pay | Admitting: Psychiatry

## 2014-03-14 VITALS — BP 110/78 | Ht 64.0 in | Wt 211.0 lb

## 2014-03-14 DIAGNOSIS — F329 Major depressive disorder, single episode, unspecified: Secondary | ICD-10-CM | POA: Diagnosis not present

## 2014-03-14 DIAGNOSIS — F339 Major depressive disorder, recurrent, unspecified: Secondary | ICD-10-CM

## 2014-03-14 MED ORDER — HYDROXYZINE PAMOATE 25 MG PO CAPS: 25.0000 mg | ORAL_CAPSULE | Freq: Three times a day (TID) | ORAL | Status: DC | PRN

## 2014-03-14 MED ORDER — ARIPIPRAZOLE 2 MG PO TABS: 2.0000 mg | ORAL_TABLET | Freq: Every day | ORAL | Status: DC

## 2014-03-14 MED ORDER — VENLAFAXINE HCL ER 150 MG PO CP24: 150.0000 mg | ORAL_CAPSULE | Freq: Every day | ORAL | Status: DC

## 2014-03-14 MED ORDER — VENLAFAXINE HCL ER 75 MG PO CP24: 75.0000 mg | ORAL_CAPSULE | Freq: Every day | ORAL | Status: DC
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
## 2014-03-18 DIAGNOSIS — F332 Major depressive disorder, recurrent severe without psychotic features: Secondary | ICD-10-CM | POA: Diagnosis not present

## 2014-03-21 ENCOUNTER — Encounter (HOSPITAL_COMMUNITY): Payer: Self-pay | Admitting: Psychiatry

## 2014-03-21 ENCOUNTER — Ambulatory Visit (INDEPENDENT_AMBULATORY_CARE_PROVIDER_SITE_OTHER): Payer: Medicare Other | Admitting: Psychiatry

## 2014-03-21 DIAGNOSIS — F339 Major depressive disorder, recurrent, unspecified: Secondary | ICD-10-CM

## 2014-03-22 ENCOUNTER — Inpatient Hospital Stay: Payer: Self-pay | Admitting: Psychiatry

## 2014-03-22 DIAGNOSIS — F332 Major depressive disorder, recurrent severe without psychotic features: Secondary | ICD-10-CM | POA: Diagnosis present

## 2014-03-22 DIAGNOSIS — E119 Type 2 diabetes mellitus without complications: Secondary | ICD-10-CM | POA: Diagnosis present

## 2014-03-22 DIAGNOSIS — F312 Bipolar disorder, current episode manic severe with psychotic features: Secondary | ICD-10-CM | POA: Diagnosis not present

## 2014-03-22 DIAGNOSIS — R918 Other nonspecific abnormal finding of lung field: Secondary | ICD-10-CM | POA: Diagnosis not present

## 2014-03-22 DIAGNOSIS — E785 Hyperlipidemia, unspecified: Secondary | ICD-10-CM | POA: Diagnosis present

## 2014-03-22 DIAGNOSIS — Z01810 Encounter for preprocedural cardiovascular examination: Secondary | ICD-10-CM | POA: Diagnosis not present

## 2014-03-22 DIAGNOSIS — F411 Generalized anxiety disorder: Secondary | ICD-10-CM | POA: Diagnosis present

## 2014-03-22 DIAGNOSIS — F339 Major depressive disorder, recurrent, unspecified: Secondary | ICD-10-CM | POA: Diagnosis not present

## 2014-03-22 LAB — BEHAVIORAL MEDICINE 1 PANEL
ALBUMIN: 3.5 g/dL (ref 3.4–5.0)
ALT: 37 U/L (ref 12–78)
AST: 52 U/L — AB (ref 15–37)
Alkaline Phosphatase: 106 U/L
Anion Gap: 5 — ABNORMAL LOW (ref 7–16)
BUN: 13 mg/dL (ref 7–18)
Basophil #: 0.1 10*3/uL (ref 0.0–0.1)
Basophil %: 1 %
Bilirubin,Total: 0.3 mg/dL (ref 0.2–1.0)
CALCIUM: 9.3 mg/dL (ref 8.5–10.1)
CO2: 27 mmol/L (ref 21–32)
CREATININE: 0.94 mg/dL (ref 0.60–1.30)
Chloride: 105 mmol/L (ref 98–107)
EGFR (African American): >60
EGFR (Non-African Amer.): >60
Eosinophil #: 0.4 10*3/uL (ref 0.0–0.7)
Eosinophil %: 4.9 %
Glucose: 158 mg/dL — ABNORMAL HIGH (ref 65–99)
HCT: 41.1 % (ref 35.0–47.0)
HGB: 13.4 g/dL (ref 12.0–16.0)
LYMPHS PCT: 28.3 %
Lymphocyte #: 2.6 10*3/uL (ref 1.0–3.6)
MCH: 25.9 pg — ABNORMAL LOW (ref 26.0–34.0)
MCHC: 32.7 g/dL (ref 32.0–36.0)
MCV: 79 fL — ABNORMAL LOW (ref 80–100)
MONO ABS: 0.4 x10 3/mm (ref 0.2–0.9)
Monocyte %: 4.8 %
Neutrophil #: 5.6 10*3/uL (ref 1.4–6.5)
Neutrophil %: 61 %
Osmolality: 277 (ref 275–301)
Platelet: 211 10*3/uL (ref 150–440)
Potassium: 4.3 mmol/L (ref 3.5–5.1)
RBC: 5.19 10*6/uL (ref 3.80–5.20)
RDW: 14.6 % — ABNORMAL HIGH (ref 11.5–14.5)
Sodium: 137 mmol/L (ref 136–145)
Thyroid Stimulating Horm: 1.12 u[IU]/mL
Total Protein: 7.4 g/dL (ref 6.4–8.2)
WBC: 9.2 10*3/uL (ref 3.6–11.0)

## 2014-03-22 LAB — URINALYSIS, COMPLETE
BILIRUBIN, UR: NEGATIVE
BLOOD: NEGATIVE
Glucose,UR: NEGATIVE mg/dL (ref 0–75)
NITRITE: NEGATIVE
PH: 5 (ref 4.5–8.0)
PROTEIN: NEGATIVE
RBC,UR: 2 /HPF (ref 0–5)
SPECIFIC GRAVITY: 1.03 (ref 1.003–1.030)
Squamous Epithelial: 7
WBC UR: 27 /HPF (ref 0–5)

## 2014-03-22 NOTE — Patient Instructions (Signed)
Discussed orally 
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
## 2014-03-25 ENCOUNTER — Ambulatory Visit: Payer: Self-pay | Admitting: Psychiatry

## 2014-03-30 ENCOUNTER — Ambulatory Visit: Payer: Self-pay | Admitting: Psychiatry

## 2014-04-04 ENCOUNTER — Ambulatory Visit (INDEPENDENT_AMBULATORY_CARE_PROVIDER_SITE_OTHER): Payer: Medicare Other | Admitting: Psychiatry

## 2014-04-04 ENCOUNTER — Encounter (HOSPITAL_COMMUNITY): Payer: Self-pay | Admitting: Psychiatry

## 2014-04-04 VITALS — BP 120/70 | Ht 64.0 in | Wt 211.0 lb

## 2014-04-04 DIAGNOSIS — F329 Major depressive disorder, single episode, unspecified: Secondary | ICD-10-CM

## 2014-04-04 DIAGNOSIS — F339 Major depressive disorder, recurrent, unspecified: Secondary | ICD-10-CM

## 2014-04-04 MED ORDER — CLONAZEPAM 0.5 MG PO TABS: 0.5000 mg | ORAL_TABLET | Freq: Three times a day (TID) | ORAL | Status: DC
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
## 2014-04-08 ENCOUNTER — Ambulatory Visit (INDEPENDENT_AMBULATORY_CARE_PROVIDER_SITE_OTHER): Payer: Medicare Other | Admitting: Psychiatry

## 2014-04-08 DIAGNOSIS — F339 Major depressive disorder, recurrent, unspecified: Secondary | ICD-10-CM

## 2014-04-08 NOTE — Patient Instructions (Signed)
Discussed orally 

## 2014-04-08 NOTE — Progress Notes (Signed)
   THERAPIST PROGRESS NOTE  Session Time: Friday 04/08/2014 11:15 AM - 11:55 AM  Participation Level: Active  Behavioral Response: CasualAlertDepressed  Type of Therapy: Individual Therapy  Treatment Goals addressed: Improve self-care, Improve daily routine and structure  Interventions: Supportive  Summary: [name] is a 48 y.o. female who presents with a long-standing history of chronic recurrent severe depressive symptoms accompanied by chronic anxiety. She has had multiple hospitalizations due to to suicidal attempts and depression. Her current symptoms include depressed mood, anxiety, excessive worrying, panic attacks, and passive suicidal ideations.Sincle last session 3 weeks ago, she had one treatment at [facility] and did not qualify for any more inpatient treatment as she no longer was suicidal per patient's report. She states being unable to participate in the outpatient program due to transportation issues. However, she reports feeling better and being more active. She reports taking daughter to [employer] and going bowling. She also volunteered at daughter's school yesterday for 20 minutes. She still reports difficulty getting up every day and taking a shower. Patient also reports poor self-esteem, unresolved grief and loss issues regarding sister's death as well as 2 other relationships, unresolved issues related to trauma history, and concerns about her sexual preference.    Suicidal/Homicidal: Patient reports having fleeting suicidal ideations last weekend but denies any current suicidal ideations, intent, or plan. She agrees to call this practice, 911 or have someone take her to ER should symptoms worsen.  Therapist Response: Therapist works with patient to identify and verbalize feelings, reinforce patient's efforts to increase activity, identify ways to improve daily routine/structure.  Plan: Return again in 2 weeks. Patient agrees to use plan your day  handouts and bring to next session.  Diagnosis: Axis I: Major Depression, Recurrent severe    Axis II: Deferred    Cortlan Dolin, LCSW 04/08/2014

## 2014-04-25 ENCOUNTER — Ambulatory Visit (INDEPENDENT_AMBULATORY_CARE_PROVIDER_SITE_OTHER): Payer: Medicare Other | Admitting: Psychiatry

## 2014-04-25 DIAGNOSIS — F339 Major depressive disorder, recurrent, unspecified: Secondary | ICD-10-CM

## 2014-04-25 NOTE — Progress Notes (Signed)
   THERAPIST PROGRESS NOTE  Session Time:  Monday 04/25/2014 9:00 AM - 9:50 AM  Participation Level: Active  Behavioral Response: CasualAlertDepressed  Type of Therapy: Individual Therapy  Treatment Goals addressed:  Increase self acceptance      Improve ability to manage stress and anxiety we decreased intensity and frequency of anxiety response ( panic attacks, avoidance) being able to go places and doing errands      Improve mood and resume normal interest in activities increasing involvement and staying out of bed.  Interventions: CBT and Supportive  Summary: [name] is a 48 y.o. female who presents with a long-standing history of chronic recurrent severe depressive symptoms accompanied by chronic anxiety. She has had multiple hospitalizations due to to suicidal attempts and depression. Her current symptoms include depressed mood, anxiety, excessive worrying, panic attacks, and passive suicidal ideations. Sincle last session 2 weeks ago, patient has been more active, accomplishing more tasks, and experiencing more energy. She reports no suicidal ideations since last session.  She states feeling better and experiencing improved mood until yesterday. Patient reports thinking negatively of self after a friend made comments about patient being unable to drive long distances due to anxiety. She admits poor self-esteem and difficulty being assertive.   Suicidal/Homicidal: No  Therapist Response:   Therapist works with patient to process feelings, reinforce her efforts to increase involvement in activity, identify ways to improve self-care, develop treatment plan, began to discuss connection between thoughts/mood/behaviors, and to review relaxation technique  Plan: Return again in 2 weeks.  Diagnosis: Axis I: MDD, Anxiety    Axis II: Deferred    Shakedra Beam, LCSW 04/25/2014

## 2014-04-25 NOTE — Patient Instructions (Signed)
Discussed orally 

## 2014-05-02 ENCOUNTER — Ambulatory Visit (INDEPENDENT_AMBULATORY_CARE_PROVIDER_SITE_OTHER): Payer: Medicare Other | Admitting: Psychiatry

## 2014-05-02 ENCOUNTER — Encounter (HOSPITAL_COMMUNITY): Payer: Self-pay | Admitting: Psychiatry

## 2014-05-02 VITALS — BP 120/78 | Ht 64.0 in | Wt 212.0 lb

## 2014-05-02 DIAGNOSIS — F339 Major depressive disorder, recurrent, unspecified: Secondary | ICD-10-CM

## 2014-05-02 DIAGNOSIS — F329 Major depressive disorder, single episode, unspecified: Secondary | ICD-10-CM | POA: Diagnosis not present

## 2014-05-02 MED ORDER — CLONAZEPAM 0.5 MG PO TABS: 0.5000 mg | ORAL_TABLET | Freq: Three times a day (TID) | ORAL | Status: DC

## 2014-05-02 MED ORDER — VENLAFAXINE HCL ER 150 MG PO CP24: 150.0000 mg | ORAL_CAPSULE | Freq: Every day | ORAL | Status: DC

## 2014-05-02 MED ORDER — ARIPIPRAZOLE 2 MG PO TABS: 2.0000 mg | ORAL_TABLET | Freq: Every day | ORAL | Status: DC

## 2014-05-02 MED ORDER — VENLAFAXINE HCL ER 75 MG PO CP24: 75.0000 mg | ORAL_CAPSULE | Freq: Every day | ORAL | Status: DC
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
## 2014-05-03 ENCOUNTER — Telehealth (HOSPITAL_COMMUNITY): Payer: Self-pay | Admitting: *Deleted

## 2014-05-04 DIAGNOSIS — H52229 Regular astigmatism, unspecified eye: Secondary | ICD-10-CM | POA: Diagnosis not present

## 2014-05-04 DIAGNOSIS — H5210 Myopia, unspecified eye: Secondary | ICD-10-CM | POA: Diagnosis not present

## 2014-05-04 DIAGNOSIS — H524 Presbyopia: Secondary | ICD-10-CM | POA: Diagnosis not present

## 2014-05-04 DIAGNOSIS — E119 Type 2 diabetes mellitus without complications: Secondary | ICD-10-CM | POA: Diagnosis not present

## 2014-05-12 ENCOUNTER — Ambulatory Visit (HOSPITAL_COMMUNITY): Payer: Self-pay | Admitting: Psychiatry

## 2014-05-27 ENCOUNTER — Encounter (HOSPITAL_COMMUNITY): Payer: Self-pay | Admitting: Psychiatry

## 2014-05-27 ENCOUNTER — Ambulatory Visit (INDEPENDENT_AMBULATORY_CARE_PROVIDER_SITE_OTHER): Payer: Medicare Other | Admitting: Psychiatry

## 2014-05-27 VITALS — BP 130/78 | Ht 64.0 in | Wt 214.0 lb

## 2014-05-27 DIAGNOSIS — F603 Borderline personality disorder: Secondary | ICD-10-CM

## 2014-05-27 DIAGNOSIS — F339 Major depressive disorder, recurrent, unspecified: Secondary | ICD-10-CM

## 2014-05-27 DIAGNOSIS — F329 Major depressive disorder, single episode, unspecified: Secondary | ICD-10-CM

## 2014-05-27 MED ORDER — VENLAFAXINE HCL ER 75 MG PO CP24: 75.0000 mg | ORAL_CAPSULE | Freq: Every day | ORAL | Status: DC

## 2014-05-27 MED ORDER — ARIPIPRAZOLE 2 MG PO TABS: 2.0000 mg | ORAL_TABLET | Freq: Every day | ORAL | Status: DC

## 2014-05-27 MED ORDER — CLONAZEPAM 0.5 MG PO TABS: 0.5000 mg | ORAL_TABLET | Freq: Three times a day (TID) | ORAL | Status: DC

## 2014-05-27 MED ORDER — VENLAFAXINE HCL ER 150 MG PO CP24: 150.0000 mg | ORAL_CAPSULE | Freq: Every day | ORAL | Status: DC
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
## 2014-05-30 ENCOUNTER — Ambulatory Visit (HOSPITAL_COMMUNITY): Payer: Self-pay | Admitting: Psychiatry

## 2014-05-30 ENCOUNTER — Telehealth (HOSPITAL_COMMUNITY): Payer: Self-pay | Admitting: *Deleted

## 2014-05-31 ENCOUNTER — Ambulatory Visit (INDEPENDENT_AMBULATORY_CARE_PROVIDER_SITE_OTHER): Payer: Medicare Other | Admitting: Psychiatry

## 2014-05-31 DIAGNOSIS — F339 Major depressive disorder, recurrent, unspecified: Secondary | ICD-10-CM

## 2014-05-31 NOTE — Telephone Encounter (Signed)
noted 

## 2014-05-31 NOTE — Patient Instructions (Signed)
Discussed orally 

## 2014-05-31 NOTE — Progress Notes (Signed)
   THERAPIST PROGRESS NOTE  Session Time: Tuesday 05/31/2014 9:10 AM - 10:00 AM  Participation Level: Active  Behavioral Response: CasualAlertAnxious and Depressed  Type of Therapy: Individual Therapy  Treatment Goals addressed:  Increase self acceptance          Improve ability to manage stress and anxiety we decreased intensity and frequency of anxiety response ( panic attacks, avoidance) being able to go places and doing errands                                                      Improve mood and resume normal interest in activities increasing involvement and staying out of bed.   Interventions: CBT and Supportive  Summary: [name] is a 48 y.o. female who presents with a long-standing history of chronic recurrent severe depressive symptoms accompanied by chronic anxiety. She has had multiple hospitalizations due to to suicidal attempts and depression. Her current symptoms include depressed mood, anxiety, excessive worrying, panic attacks, and passive suicidal ideations. Since last session. Patient reports doing well until a Memorial Day cookout with her family. 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 She reports feeling worthless and hopeless as she is unable to participate in these activities. She expresses frustration as she says family members don't understand her illness and her situation. She reports additional stress related to parenting her 76 year old daughter and feels guilty she has been lying around in bed most of the weekend due to being depressed and not setting boundaries with daughter. Patient also is anxious about upcoming summer vacation as she fears other children will want to congregate at her home and patient doesn't feel up to it. Patient reports being so depressed that she called a friend who encouraged her to schedule an earlier appointment with this  clinician.   Suicidal/Homicidal: Patient reports having passive suicidal ideations yesterday with no intent. She states she has to be here for her daughter. She denies current suicidal ideations. Patient agrees to call this practice, call 911, or have someone take her to the emergency room should symptoms worsen.  Therapist Response: Therapist works with patient to process feelings, discuss thought patterns and effects on mood and behavior, identify and challenge cognitive distortions, discuss the process of change and provides patient with a handout, began to discuss grounding techniques and provides handout, and encourage patient to improve daily routine and structure  Plan: Return again in 1 week. Patient agrees to use plan your day handouts and bring to next session,  to use grounding techniques, and use coping statements.  Diagnosis: Axis I: Major depressive disorder    Axis II: Borderline personality disorder    Kushal Saunders, LCSW 05/31/2014

## 2014-06-08 ENCOUNTER — Ambulatory Visit (INDEPENDENT_AMBULATORY_CARE_PROVIDER_SITE_OTHER): Payer: Medicare Other | Admitting: Psychiatry

## 2014-06-08 ENCOUNTER — Ambulatory Visit (HOSPITAL_COMMUNITY): Payer: Self-pay | Admitting: Psychiatry

## 2014-06-08 DIAGNOSIS — F339 Major depressive disorder, recurrent, unspecified: Secondary | ICD-10-CM | POA: Diagnosis not present

## 2014-06-08 NOTE — Patient Instructions (Signed)
Discussed orally 

## 2014-06-08 NOTE — Progress Notes (Signed)
   THERAPIST PROGRESS NOTE  Session Time: Wednesday 06/08/2014 10:05 AM - 10:55 AM  Participation Level: Active  Behavioral Response: CasualAlertAnxious and Depressed  Type of Therapy: Individual Therapy  Treatment Goals addressed:  Increase self acceptance          Improve ability to manage stress and anxiety we decreased intensity and frequency of anxiety response ( panic attacks, avoidance) being able to go places and doing errands          Improve mood and resume normal interest in activities increasing involvement and staying out of bed.   Interventions: CBT and Supportive  Summary: [name] is a 48 y.o. female who presents with a long-standing history of chronic recurrent severe depressive symptoms accompanied by chronic anxiety. She has had multiple hospitalizations due to to suicidal attempts and depression. Her current symptoms include depressed mood, anxiety, excessive worrying, panic attacks, and passive suicidal ideations.   Patient reports increased depressed mood since last session. She reports her mother hurt her feelings by making a derogatory remark about patient and fussing about patient not attending church. She also expresses frustration mother does not understand her mental illness. Patient reports becoming so distraught and depressed that she considered going to [facility] in [place]. She admits having fleeting suicidal ideations with no intent and no plan on Monday. She states coping by focusing on taking care of her daughter and doing household tasks. Patient also has been using grounding techniques. She continues to struggle with poor motivation. She also continues to have panic attacks when going places and is worried about her boyfriend taking her and her daughter to a movie this Friday.   Suicidal/Homicidal: Patient admits fleeting suicidal ideations earlier this week  with no intent and no plan. She denies current suicidal ideations. Patient  agrees to call this practice, call 911, or have someone take her to ER should symptoms worsen.  Therapist Response: Therapist works with patient to process feelings, discuss patient's relationship with mother and identify underlying emotions, identify ways to reframe negative thoughts, identify panic cycle and ways to intervene, reinforce patient's use of grounding techniques.  Plan: Return again in 1-2 weeks. Patient agrees to use plan your day handouts, review and use panic cycle handouts, practice relaxation breathing.  Diagnosis: Axis I: MDD    Axis II: Borderline Personality Disorder    Zarion Oliff, LCSW 06/08/2014

## 2014-06-09 ENCOUNTER — Encounter (HOSPITAL_COMMUNITY): Payer: Self-pay | Admitting: Psychiatry

## 2014-06-09 ENCOUNTER — Ambulatory Visit (INDEPENDENT_AMBULATORY_CARE_PROVIDER_SITE_OTHER): Payer: Medicare Other | Admitting: Psychiatry

## 2014-06-09 VITALS — BP 110/76 | Ht 64.0 in | Wt 212.0 lb

## 2014-06-09 DIAGNOSIS — F329 Major depressive disorder, single episode, unspecified: Secondary | ICD-10-CM | POA: Diagnosis not present

## 2014-06-09 DIAGNOSIS — F339 Major depressive disorder, recurrent, unspecified: Secondary | ICD-10-CM
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
## 2014-06-10 DIAGNOSIS — E119 Type 2 diabetes mellitus without complications: Secondary | ICD-10-CM | POA: Diagnosis not present

## 2014-06-10 DIAGNOSIS — I10 Essential (primary) hypertension: Secondary | ICD-10-CM | POA: Diagnosis not present

## 2014-06-14 DIAGNOSIS — I10 Essential (primary) hypertension: Secondary | ICD-10-CM | POA: Diagnosis not present

## 2014-06-14 DIAGNOSIS — J019 Acute sinusitis, unspecified: Secondary | ICD-10-CM | POA: Diagnosis not present

## 2014-06-14 DIAGNOSIS — E785 Hyperlipidemia, unspecified: Secondary | ICD-10-CM | POA: Diagnosis not present

## 2014-06-14 DIAGNOSIS — E119 Type 2 diabetes mellitus without complications: Secondary | ICD-10-CM | POA: Diagnosis not present

## 2014-06-24 ENCOUNTER — Ambulatory Visit (HOSPITAL_COMMUNITY): Payer: Self-pay | Admitting: Psychiatry

## 2014-06-24 ENCOUNTER — Ambulatory Visit (INDEPENDENT_AMBULATORY_CARE_PROVIDER_SITE_OTHER): Payer: Medicare Other | Admitting: Psychiatry

## 2014-06-24 DIAGNOSIS — F331 Major depressive disorder, recurrent, moderate: Secondary | ICD-10-CM

## 2014-06-24 NOTE — Progress Notes (Signed)
   THERAPIST PROGRESS NOTE  Session Time:  Friday 06/24/2014 10:10 AM - 10:55 AM  Participation Level: Active  Behavioral Response: CasualAlertAnxious  Type of Therapy: Individual Therapy  Treatment Goals addressed: Increase self acceptance  Improve ability to manage stress and anxiety we decreased intensity and frequency of anxiety response ( panic attacks, avoidance) being able to go places and doing errands  Improve mood and resume normal interest in activities increasing involvement and staying out of bed.    Interventions: CBT and Supportive  Summary: [name] is a 48 y.o. female who presents with a long-standing history of chronic recurrent severe depressive symptoms accompanied by chronic anxiety. She has had multiple hospitalizations due to to suicidal attempts and depression. Her current symptoms include depressed mood, anxiety, excessive worrying, panic attacks, and passive suicidal ideations.   Since last session, patient reports improved mood and increased involvement in activity. She has gone to the movies, out to eat, and visited friend. She continues to enjoy relationship with boyfriend who has taken patient and her daughter on several outings since last session. She also has begun walking on the treadmill at the [facility]. Patient reports decreased anxiety and panic attacks. She has been using diapghragmmatic breathing and self-talk. She expresses anxiety regarding sister who recently was discharged from the hospital. She also expresses frustration regarding ex-husband failing to have quality time with their daughter. She states feeling her 22 year old son doesn't love her and that she isn't a good mother because they don't spend much time with each other.  Suicidal/Homicidal: No  Therapist Response: Therapist works with patient to process feelings, reinforce involvement in activity and identify ways to maintain consistency, discuss boundary issues, identify and challenge  negative thinking patterns.  Plan: Return again in 2-3 weeks.  Diagnosis: Axis I: MDD    Axis II: Borderline Personality Dis.    [place], [place] 06/24/2014

## 2014-06-24 NOTE — Patient Instructions (Signed)
Discussed orally 

## 2014-07-07 ENCOUNTER — Ambulatory Visit (INDEPENDENT_AMBULATORY_CARE_PROVIDER_SITE_OTHER): Payer: Medicare Other | Admitting: Psychiatry

## 2014-07-07 ENCOUNTER — Encounter (HOSPITAL_COMMUNITY): Payer: Self-pay | Admitting: Psychiatry

## 2014-07-07 VITALS — BP 110/78 | Ht 64.0 in | Wt 210.0 lb

## 2014-07-07 DIAGNOSIS — F331 Major depressive disorder, recurrent, moderate: Secondary | ICD-10-CM

## 2014-07-07 MED ORDER — VENLAFAXINE HCL ER 150 MG PO CP24: 150.0000 mg | ORAL_CAPSULE | Freq: Every day | ORAL | Status: DC

## 2014-07-07 MED ORDER — ARIPIPRAZOLE 2 MG PO TABS: 2.0000 mg | ORAL_TABLET | Freq: Every day | ORAL | Status: DC

## 2014-07-07 MED ORDER — CLONAZEPAM 0.5 MG PO TABS: 0.5000 mg | ORAL_TABLET | Freq: Three times a day (TID) | ORAL | Status: DC

## 2014-07-07 MED ORDER — VENLAFAXINE HCL ER 75 MG PO CP24: 75.0000 mg | ORAL_CAPSULE | Freq: Every day | ORAL | Status: DC
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
## 2014-07-12 ENCOUNTER — Ambulatory Visit (INDEPENDENT_AMBULATORY_CARE_PROVIDER_SITE_OTHER): Payer: Medicare Other | Admitting: Psychiatry

## 2014-07-12 DIAGNOSIS — F331 Major depressive disorder, recurrent, moderate: Secondary | ICD-10-CM | POA: Diagnosis not present

## 2014-07-12 NOTE — Progress Notes (Signed)
   THERAPIST PROGRESS NOTE  Session Time:  Tuesday 07/12/2014 9:05 AM - 9:50 AM  Participation Level: Active  Behavioral Response: CasualAlertAnxious  Type of Therapy: Individual Therapy  Treatment Goals addressed: Increase self acceptance  Improve ability to manage stress and anxiety we decreased intensity and frequency of anxiety response ( panic attacks, avoidance) being able to go places and doing errands  Improve mood and resume normal interest in activities increasing involvement and staying out of bed.   Interventions: CBT and Supportive  Summary: [name] is a 48 y.o. female who presents with a long-standing history of chronic recurrent severe depressive symptoms accompanied by chronic anxiety. She has had multiple hospitalizations due to to suicidal attempts and depression. Her current symptoms include depressed mood, anxiety, excessive worrying, panic attacks, and passive suicidal ideations.   Patient reports continued improved mood and involvement in activity. She and her daughter have continued to go on various outings with her boyfriend. Patient reports continued decreased intensity and frequency of panic attacks. She reports feeling better about managing anxiety.  She continues to experience anxiety when thinking about driving out of town and currently is unable to do this due to fear. However, she states wanting to be able to drive to [place], etc. Patient reports increased self-acceptance and positive mood. She is experiencing fatigue and struggles with maintaining healthy eating habits and regular exercise.   Suicidal/Homicidal: No  Therapist Response: Therapist works with patient to review progress on goals, set priorities regarding goals, reinforce patient's involvement in activity. identify ways to improve self-care and maintain consistency  Plan: Return again in 2-3 weeks.  Diagnosis: Axis I: MDD, recurrent, moderate    Axis II: Borderline Personality  Dis.    [place], [place] 07/12/2014

## 2014-07-12 NOTE — Patient Instructions (Signed)
Discussed orally 

## 2014-07-26 ENCOUNTER — Ambulatory Visit (INDEPENDENT_AMBULATORY_CARE_PROVIDER_SITE_OTHER): Payer: Medicare Other | Admitting: Psychiatry

## 2014-07-26 DIAGNOSIS — F331 Major depressive disorder, recurrent, moderate: Secondary | ICD-10-CM

## 2014-07-26 NOTE — Patient Instructions (Signed)
Discussed orally 

## 2014-07-26 NOTE — Progress Notes (Signed)
   THERAPIST PROGRESS NOTE  Session Time: Tuesday 07/26/2014 10:10 AM - !0:55 AM  Participation Level: Active  Behavioral Response: CasualAlertAnxious  Type of Therapy: Individual Therapy  Treatment Goals addressed: Increase self acceptance  Improve ability to manage stress and anxiety we decreased intensity and frequency of anxiety response ( panic attacks, avoidance) being able to go places and doing errands  Improve mood and resume normal interest in activities increasing involvement and staying out of bed.    Interventions: CBT and Supportive  Summary: [name] is a 48 y.o. female who presents with a long-standing history of chronic recurrent severe depressive symptoms accompanied by chronic anxiety. She has had multiple hospitalizations due to to suicidal attempts and depression. Her current symptoms include depressed mood, anxiety, excessive worrying, panic attacks, and passive suicidal ideations.   Patient reports continued improved mood and involvement in activity. She continues to have panic attacks but reports managing these much better and cites an example that occurred when she was visiting her sister in the hospital. Patient used good coping skills as well as notified her family rather than hiding it as she has done in the past. She also has more realistic expectations of self regarding her ability to perform tasks when she has migraines. Patient initially feared she would experience a depressed mood after she suffered a migraine and had reduced activity. However, she planned activity to resume involvement which was very helpful per patient's report. She expresses anxiety regarding man she is dating as does not want to introduce her to his family at this point. Patient fears he may not be as serious about the relationship as she is.   Suicidal/Homicidal: No  Therapist Response: Therapist works with patient to process feelings, reinforce patient's use of healthy coping  skills, identify her expectations in a relationship, explore her options.  Plan: Return again in 2-3 weeks.  Diagnosis: Axis I: MDD    Axis II: Deferred    Eziah Negro, LCSW 07/26/2014

## 2014-08-04 ENCOUNTER — Encounter (HOSPITAL_COMMUNITY): Payer: Self-pay | Admitting: Psychiatry

## 2014-08-04 ENCOUNTER — Ambulatory Visit (INDEPENDENT_AMBULATORY_CARE_PROVIDER_SITE_OTHER): Payer: Medicare Other | Admitting: Psychiatry

## 2014-08-04 VITALS — BP 112/78 | HR 88 | Ht 64.0 in | Wt 210.4 lb

## 2014-08-04 DIAGNOSIS — F331 Major depressive disorder, recurrent, moderate: Secondary | ICD-10-CM

## 2014-08-04 MED ORDER — ALPRAZOLAM 1 MG PO TABS
1.0000 mg | ORAL_TABLET | Freq: Every day | ORAL | Status: DC
Start: 2014-08-04 — End: 2014-10-18
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
## 2014-08-12 ENCOUNTER — Ambulatory Visit (HOSPITAL_COMMUNITY): Payer: Self-pay | Admitting: Psychiatry

## 2014-09-01 ENCOUNTER — Telehealth: Payer: Self-pay | Admitting: Obstetrics & Gynecology

## 2014-09-01 NOTE — Telephone Encounter (Signed)
Pt c/o vaginal itching requesting prescription for Diflucan. Pt has an appt with Dr. Elonda Husky 09/15/2014 for Pap/physical.

## 2014-09-06 ENCOUNTER — Encounter (HOSPITAL_COMMUNITY): Payer: Self-pay | Admitting: Psychiatry

## 2014-09-06 ENCOUNTER — Ambulatory Visit (INDEPENDENT_AMBULATORY_CARE_PROVIDER_SITE_OTHER): Payer: Medicare Other | Admitting: Psychiatry

## 2014-09-06 VITALS — BP 126/71 | HR 92 | Ht 64.0 in | Wt 212.6 lb

## 2014-09-06 DIAGNOSIS — F331 Major depressive disorder, recurrent, moderate: Secondary | ICD-10-CM

## 2014-09-06 DIAGNOSIS — F329 Major depressive disorder, single episode, unspecified: Secondary | ICD-10-CM | POA: Diagnosis not present

## 2014-09-06 MED ORDER — ARIPIPRAZOLE 2 MG PO TABS: 2.0000 mg | ORAL_TABLET | Freq: Every day | ORAL | Status: DC

## 2014-09-06 MED ORDER — CLONAZEPAM 0.5 MG PO TABS: 0.5000 mg | ORAL_TABLET | Freq: Three times a day (TID) | ORAL | Status: DC

## 2014-09-06 MED ORDER — VENLAFAXINE HCL ER 75 MG PO CP24: 75.0000 mg | ORAL_CAPSULE | Freq: Every day | ORAL | Status: DC

## 2014-09-06 MED ORDER — VENLAFAXINE HCL ER 150 MG PO CP24: 150.0000 mg | ORAL_CAPSULE | Freq: Every day | ORAL | Status: DC
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
## 2014-09-08 ENCOUNTER — Telehealth: Payer: Self-pay | Admitting: Obstetrics & Gynecology

## 2014-09-08 ENCOUNTER — Ambulatory Visit (HOSPITAL_COMMUNITY): Payer: Self-pay | Admitting: Psychiatry

## 2014-09-08 MED ORDER — FLUCONAZOLE 150 MG PO TABS: 150.0000 mg | ORAL_TABLET | Freq: Once | ORAL | Status: DC

## 2014-09-15 ENCOUNTER — Ambulatory Visit (INDEPENDENT_AMBULATORY_CARE_PROVIDER_SITE_OTHER): Payer: Medicare Other | Admitting: Obstetrics & Gynecology

## 2014-09-15 ENCOUNTER — Encounter: Payer: Self-pay | Admitting: Obstetrics & Gynecology

## 2014-09-15 ENCOUNTER — Other Ambulatory Visit (HOSPITAL_COMMUNITY)
Admission: RE | Admit: 2014-09-15 | Discharge: 2014-09-15 | Disposition: A | Discharge status: Home / Self Care | Payer: Medicare Other | Source: Ambulatory Visit | Attending: Obstetrics & Gynecology | Admitting: Obstetrics & Gynecology

## 2014-09-15 VITALS — BP 120/90 | Ht 65.0 in | Wt 212.0 lb

## 2014-09-15 DIAGNOSIS — Z1151 Encounter for screening for human papillomavirus (HPV): Secondary | ICD-10-CM | POA: Insufficient documentation

## 2014-09-15 DIAGNOSIS — Z01419 Encounter for gynecological examination (general) (routine) without abnormal findings: Secondary | ICD-10-CM

## 2014-09-15 DIAGNOSIS — R87810 Cervical high risk human papillomavirus (HPV) DNA test positive: Secondary | ICD-10-CM | POA: Insufficient documentation

## 2014-09-15 DIAGNOSIS — Z124 Encounter for screening for malignant neoplasm of cervix: Secondary | ICD-10-CM | POA: Insufficient documentation

## 2014-09-15 DIAGNOSIS — E119 Type 2 diabetes mellitus without complications: Secondary | ICD-10-CM | POA: Diagnosis not present

## 2014-09-15 DIAGNOSIS — I10 Essential (primary) hypertension: Secondary | ICD-10-CM | POA: Diagnosis not present

## 2014-09-15 NOTE — Progress Notes (Signed)
Patient ID: [name], female   DOB: 14-May-1966, 48 y.o.   MRN: [id number] Subjective:     [name] is a 48 y.o. female here for a routine exam.  No LMP recorded. Patient is postmenopausal. No obstetric history on file. Birth Control Method:  Supracervical hysterectomy  Menstrual Calendar(currently): na  Current complaints: none.   Current acute medical issues:  none   Recent Gynecologic History No LMP recorded. Patient is postmenopausal. Last Pap: 2014,  normal Last mammogram: 2012,  normal  Past Medical History  Diagnosis Date  . Hypertension   . Diabetes mellitus   . Arthritis   . Anxiety   . Headache(784.0)   . History of borderline personality disorder   . Hyperlipidemia   . Depression   . Personality disorder     Past Surgical History  Procedure Laterality Date  . Foot surgery    . Total abdominal hysterectomy w/ bilateral salpingoophorectomy      OB History   Grav Para Term Preterm Abortions TAB SAB Ect Mult Living                  History   Social History  . Marital Status: Legally Separated    Spouse Name: N/A    Number of Children: N/A  . Years of Education: N/A   Social History Main Topics  . Smoking status: Never Smoker   . Smokeless tobacco: Never Used  . Alcohol Use: No  . Drug Use: No  . Sexual Activity: Yes    Birth Control/ Protection: None, Post-menopausal, Surgical   Other Topics Concern  . None   Social History Narrative  . None    Family History  Problem Relation Age of Onset  . Depression Mother   . OCD Other   . ADD / ADHD Neg Hx   . Alcohol abuse Neg Hx   . Drug abuse Neg Hx   . Anxiety disorder Neg Hx   . Bipolar disorder Neg Hx   . Dementia Neg Hx   . Paranoid behavior Neg Hx   . Schizophrenia Neg Hx   . Seizures Neg Hx   . Sexual abuse Neg Hx   . Physical abuse Neg Hx   . Ovarian cancer Sister   . Cirrhosis Sister      Review of Systems  Review of Systems  Constitutional: Negative for fever,  chills, weight loss, malaise/fatigue and diaphoresis.  HENT: Negative for hearing loss, ear pain, nosebleeds, congestion, sore throat, neck pain, tinnitus and ear discharge.   Eyes: Negative for blurred vision, double vision, photophobia, pain, discharge and redness.  Respiratory: Negative for cough, hemoptysis, sputum production, shortness of breath, wheezing and stridor.   Cardiovascular: Negative for chest pain, palpitations, orthopnea, claudication, leg swelling and PND.  Gastrointestinal: negative for abdominal pain. Negative for heartburn, nausea, vomiting, diarrhea, constipation, blood in stool and melena.  Genitourinary: Negative for dysuria, urgency, frequency, hematuria and flank pain.  Musculoskeletal: Negative for myalgias, back pain, joint pain and falls.  Skin: Negative for itching and rash.  Neurological: Negative for dizziness, tingling, tremors, sensory change, speech change, focal weakness, seizures, loss of consciousness, weakness and headaches.  Endo/Heme/Allergies: Negative for environmental allergies and polydipsia. Does not bruise/bleed easily.  Psychiatric/Behavioral: Negative for depression, suicidal ideas, hallucinations, memory loss and substance abuse. The patient is not nervous/anxious and does not have insomnia.        Objective:    Physical Exam  Vitals reviewed. Constitutional: She is oriented to  person, place, and time. She appears well-developed and well-nourished.  HENT:  Head: Normocephalic and atraumatic.        Right Ear: External ear normal.  Left Ear: External ear normal.  Nose: Nose normal.  Mouth/Throat: Oropharynx is clear and moist.  Eyes: Conjunctivae and EOM are normal. Pupils are equal, round, and reactive to light. Right eye exhibits no discharge. Left eye exhibits no discharge. No scleral icterus.  Neck: Normal range of motion. Neck supple. No tracheal deviation present. No thyromegaly present.  Cardiovascular: Normal rate, regular rhythm,  normal heart sounds and intact distal pulses.  Exam reveals no gallop and no friction rub.   No murmur heard. Respiratory: Effort normal and breath sounds normal. No respiratory distress. She has no wheezes. She has no rales. She exhibits no tenderness.  GI: Soft. Bowel sounds are normal. She exhibits no distension and no mass. There is no tenderness. There is no rebound and no guarding.  Genitourinary:  Breasts no masses skin changes or nipple changes bilaterally      Vulva is normal without lesions Vagina is pink moist without discharge Cervix normal in appearance and pap is done Uterus is absent Adnexa is negative   Musculoskeletal: Normal range of motion. She exhibits no edema and no tenderness.  Neurological: She is alert and oriented to person, place, and time. She has normal reflexes. She displays normal reflexes. No cranial nerve deficit. She exhibits normal muscle tone. Coordination normal.  Skin: Skin is warm and dry. No rash noted. No erythema. No pallor.  Psychiatric: She has a normal mood and affect. Her behavior is normal. Judgment and thought content normal.       Assessment:    Healthy female exam.    Plan:    Mammogram ordered. Follow up in: 1 year.

## 2014-09-16 LAB — CYTOLOGY - PAP

## 2014-09-19 DIAGNOSIS — Z23 Encounter for immunization: Secondary | ICD-10-CM | POA: Diagnosis not present

## 2014-09-19 DIAGNOSIS — I10 Essential (primary) hypertension: Secondary | ICD-10-CM | POA: Diagnosis not present

## 2014-09-19 DIAGNOSIS — E119 Type 2 diabetes mellitus without complications: Secondary | ICD-10-CM | POA: Diagnosis not present

## 2014-09-19 DIAGNOSIS — E785 Hyperlipidemia, unspecified: Secondary | ICD-10-CM | POA: Diagnosis not present

## 2014-09-22 ENCOUNTER — Ambulatory Visit (INDEPENDENT_AMBULATORY_CARE_PROVIDER_SITE_OTHER): Payer: Medicare Other | Admitting: Psychiatry

## 2014-09-22 DIAGNOSIS — F331 Major depressive disorder, recurrent, moderate: Secondary | ICD-10-CM | POA: Diagnosis not present

## 2014-09-22 NOTE — Progress Notes (Signed)
   THERAPIST PROGRESS NOTE  Session Time:  Thursday 09/22/2014 10:10 AM - 10:55 AM  Participation Level: Active  Behavioral Response: CasualAlertDepressed  Type of Therapy: Individual Therapy  Treatment Goals addressed: Increase self acceptance  Improve ability to manage stress and anxiety we decreased intensity and frequency of anxiety response ( panic attacks, avoidance) being able to go places and doing errands  Improve mood and resume normal interest in activities increasing involvement and staying out of bed.   Interventions: CBT and Supportive  Summary: [name] is a 48 y.o. female who presents with a long-standing history of chronic recurrent severe depressive symptoms accompanied by chronic anxiety. She has had multiple hospitalizations due to to suicidal attempts and depression. Her current symptoms include depressed mood, anxiety, excessive worrying, panic attacks, and passive suicidal ideations.   Patient reports continued involvement in activity since last session. She has been going to the [facility] 3 times per week. She has maintained involvement with her boyfriend. She also reports driving to: Hospital in [place].  However, patient reports experiencing low energy and not feeling out of doing things for the past couple of days. She fears depression may be returning. She also reports stress related to one of her friends having bariatric surgery and no longer being able to go out and eat with patient as she has in the past. Patient fears losing this friendship. Patient also expresses frustration that she has not lost weight but admits she has not changed her eating patterns.     Suicidal/Homicidal: No  Therapist Response:  Therapist works with patient to process feelings, provide dsychoeducation regarding depression identify and challenge cognitive distortions, identify reasons to improve eating pattern, reinforce patient's involvement in activity, praise patient's efforts to  drive  Plan: Return again in 2 weeks.  Diagnosis: Axis I: MDD    Axis II: Deferred    BYNUM,PEGGY, LCSW 09/22/2014

## 2014-09-22 NOTE — Patient Instructions (Signed)
Discussed orally 

## 2014-09-23 ENCOUNTER — Other Ambulatory Visit: Payer: Self-pay | Admitting: Obstetrics & Gynecology

## 2014-10-06 ENCOUNTER — Ambulatory Visit (INDEPENDENT_AMBULATORY_CARE_PROVIDER_SITE_OTHER): Payer: Medicare Other | Admitting: Psychiatry

## 2014-10-06 DIAGNOSIS — F331 Major depressive disorder, recurrent, moderate: Secondary | ICD-10-CM | POA: Diagnosis not present

## 2014-10-06 NOTE — Patient Instructions (Signed)
Discussed orally 

## 2014-10-06 NOTE — Progress Notes (Signed)
   THERAPIST PROGRESS NOTE  Session Time: Thursday 10/06/2014 9:05 AM - 10:00 AM  Participation Level: Active  Behavioral Response: CasualAlert/anxious/depressed  Type of Therapy: Individual Therapy  Treatment Goals addressed: Increase self acceptance  Improve ability to manage stress and anxiety we decreased intensity and frequency of anxiety response ( panic attacks, avoidance) being able to go places and doing errands  Improve mood and resume normal interest in activities increasing involvement and staying out of bed.   Interventions: CBT and Supportive  Summary: [name] is a 48 y.o. female who presents with a long-standing history of chronic recurrent severe depressive symptoms accompanied by chronic anxiety. She has had multiple hospitalizations due to to suicidal attempts and depression. Her current symptoms include depressed mood, anxiety, excessive worrying, panic attacks, and passive suicidal ideations.   Patient reports less depressed mood but continued anxiety and excessive worry. She continues to have negative thinking patterns and fears rejection. She cites examples of recent interactions with boyfriend and daughter as well as an interaction with her friend in which she made negative assumptions and experienced depressed mood, anxiety, and feelings of rejection. Patient has made more effort to become involved in activities and has been exercising regularly. She continues to have difficulty changing eating patterns.  Suicidal/Homicidal: No  Therapist Response: Therapist works with patient to process patient's feelings about recent interactions with boyfriend and her friend to examine thought patterns, discuss connection between thoughts/feelings/behavior, to identify cognitive distortions, practice completing a feelings and thought log, identify ways to do a mindfulness activity while eating. Therapist provides patient with handouts on cognitive distortions and a  feelings/thoughts log.  Plan: Return again in 2 weeks. Patient agrees to complete feelings and thoughts log and bring to next session  Diagnosis: Axis I: MDD, Recurrent    Axis II: Deferred    [name], LCSW 10/06/2014

## 2014-10-18 ENCOUNTER — Ambulatory Visit (INDEPENDENT_AMBULATORY_CARE_PROVIDER_SITE_OTHER): Payer: Medicare Other | Admitting: Psychiatry

## 2014-10-18 ENCOUNTER — Encounter (HOSPITAL_COMMUNITY): Payer: Self-pay | Admitting: Psychiatry

## 2014-10-18 VITALS — BP 134/78 | HR 83 | Ht 64.0 in | Wt 209.0 lb

## 2014-10-18 DIAGNOSIS — F329 Major depressive disorder, single episode, unspecified: Secondary | ICD-10-CM

## 2014-10-18 DIAGNOSIS — F331 Major depressive disorder, recurrent, moderate: Secondary | ICD-10-CM

## 2014-10-18 MED ORDER — CLONAZEPAM 0.5 MG PO TABS: 0.5000 mg | ORAL_TABLET | Freq: Three times a day (TID) | ORAL | Status: DC

## 2014-10-18 MED ORDER — VENLAFAXINE HCL ER 75 MG PO CP24: 75.0000 mg | ORAL_CAPSULE | Freq: Every day | ORAL | Status: DC

## 2014-10-18 MED ORDER — VENLAFAXINE HCL ER 150 MG PO CP24: 150.0000 mg | ORAL_CAPSULE | Freq: Every day | ORAL | Status: DC

## 2014-10-18 MED ORDER — ARIPIPRAZOLE 2 MG PO TABS: 2.0000 mg | ORAL_TABLET | Freq: Every day | ORAL | Status: DC
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
## 2014-10-20 ENCOUNTER — Ambulatory Visit (INDEPENDENT_AMBULATORY_CARE_PROVIDER_SITE_OTHER): Payer: Medicare Other | Admitting: Psychiatry

## 2014-10-20 DIAGNOSIS — F331 Major depressive disorder, recurrent, moderate: Secondary | ICD-10-CM

## 2014-10-20 NOTE — Progress Notes (Signed)
   THERAPIST PROGRESS NOTE  Session Time: Thursday 10/20/2014 9:05 AM - 10:00 AM  Participation Level: Active  Behavioral Response: CasualAlertAnxious and Depressed  Type of Therapy: Individual Therapy  Treatment Goals addressed:  Increase self acceptance  Improve ability to manage stress and anxiety we decreased intensity and frequency of anxiety response ( panic attacks, avoidance) being able to go places and doing errands  Improve mood and resume normal interest in activities increasing involvement and staying out of bed.   Interventions: CBT and Supportive  Summary: [name] is a 48 y.o. female who  presents with a long-standing history of chronic recurrent severe depressive symptoms accompanied by chronic anxiety. She has had multiple hospitalizations due to to suicidal attempts and depression. Her current symptoms include depressed mood, anxiety, excessive worrying, panic attacks, and passive suicidal ideations.   Patient reports increased depressed mood and anxiety that appear to have been triggered by recent trip with fiancee and daughter to [place]. Patient reports becoming very anxious and unable to do some of the activities boyfriend and daughter wanted to do. She became more anxious and depressed when boyfriend made comments about the trip later. This triggered patient's feelings of rejection and abandonment not only from him but from family and friends. Patient fears being alone. She reports having fleeting thoughts of engaging in self-injurious behaviors but being able to resist. She denies any suicidal thoughts.   Suicidal/Homicidal: No. Patient agrees to call this practice, call 911, or have someone take her to the ER.  Therapist Response: Therapist works with patient to process feelings, discuss distress tolerance skills and identify relaxation techniques, practice completing mood log, identify and challenge cognitive distortions  Plan: Return again in 2  weeks.  Diagnosis: Axis I: MDD, Recurrent    Axis II: Borderline Personality Dis.    [place], [place] 10/20/2014

## 2014-10-20 NOTE — Patient Instructions (Signed)
Discussed orally 

## 2014-11-03 ENCOUNTER — Ambulatory Visit (INDEPENDENT_AMBULATORY_CARE_PROVIDER_SITE_OTHER): Payer: Medicare Other | Admitting: Psychiatry

## 2014-11-03 DIAGNOSIS — F331 Major depressive disorder, recurrent, moderate: Secondary | ICD-10-CM

## 2014-11-04 NOTE — Progress Notes (Signed)
   THERAPIST PROGRESS NOTE  Session Time: Thursday 11/03/2014 9:00 AM - 9:55 AM  Participation Level: Active  Behavioral Response: CasualAlertAnxious and Depressed  Type of Therapy: Individual Therapy  Treatment Goals addressed:  Increase self acceptance  Improve ability to manage stress and anxiety we decreased intensity and frequency of anxiety response ( panic attacks, avoidance) being able to go places and doing errands  Improve mood and resume normal interest in activities increasing involvement and staying out of bed.   Interventions: CBT and Supportive  Interventions: CBT and Supportive  Summary: [name] is a 48 y.o. female who presents with a long-standing history of chronic recurrent severe depressive symptoms accompanied by chronic anxiety. She has had multiple hospitalizations due to to suicidal attempts and depression. Her current symptoms include depressed mood, anxiety, excessive worrying, panic attacks, and passive suicidal ideations.   Patient states being up and down since last session. She is pleased she was able to go to a [place] with her boyfriend and her children this past weekend. She reports being anxious but successfully using coping techniques when recognizing the initial symptoms of a panic attack and states she did not have a panic attack.. She reports increased stress and frustration related to daughter who is very persistent. Patient reports feeling overwhelmed when depressed and giving in to daughter rather than setting boundaries. She also admits difficulty setting boundaries with daughter as she fears daughter will eventually leave her and stay with her father if patient does not allow her to have her way.  Suicidal/Homicidal: Patient reports passive SI 2 weeks ago with no plan and no intent. Patient agrees to call this practice, call 911, or have someone take her to the ER should symptoms worsen.  Therapist Response: Therapist works with  patient to praise her use of healthy coping skills, identify her thought patterns and effects on mood and behavior, identify ways to intervene in negative thought patterns, discuss distress tolerance skills, discuss rationale for practicing relaxation techniques and grounding techniques daily, identify ways to improve self-care an improve daily structure and routine.  Plan: Return again in 2 weeks.  Diagnosis: Axis I: MDD, Recurrent    Axis II: Borderline Personality Dis.    [place], [place] 11/04/2014

## 2014-11-04 NOTE — Patient Instructions (Signed)
Discussed orally 

## 2014-11-14 ENCOUNTER — Ambulatory Visit (INDEPENDENT_AMBULATORY_CARE_PROVIDER_SITE_OTHER): Payer: Medicare Other | Admitting: Psychiatry

## 2014-11-14 DIAGNOSIS — F331 Major depressive disorder, recurrent, moderate: Secondary | ICD-10-CM | POA: Diagnosis not present

## 2014-11-14 NOTE — Patient Instructions (Signed)
Discussed orally 

## 2014-11-14 NOTE — Progress Notes (Signed)
     THERAPIST PROGRESS NOTE  Session Time: Monday 11/14/2014 10:15 AM - 11:00 AM  Participation Level: Active  Behavioral Response: CasualAlertAnxious and Depressed  Type of Therapy: Individual Therapy  Treatment Goals addressed:  Increase self acceptance  Improve ability to manage stress and anxiety we decreased intensity and frequency of anxiety response ( panic attacks, avoidance) being able to go places and doing errands  Improve mood and resume normal interest in activities increasing involvement and staying out of bed.   Interventions: CBT and Supportive  Summary: [name] is a 48 y.o. female who presents with a long-standing history of chronic recurrent severe depressive symptoms accompanied by chronic anxiety. She has had multiple hospitalizations due to to suicidal attempts and depression. Her current symptoms include depressed mood, anxiety, excessive worrying, panic attacks, and passive suicidal ideations.   Patient reports worsening symptoms of depression since last session. She continues to report stress related to her daughter who can be defiant and argumentative. She also reports daughter has stressed and sad about poor relationship with her father. Patient continues to express guilt about this and continues to have difficulty setting boundaries with daughter. She reports being overwhelmed this weekend, retreating to her bed for most of the weekend, and having fleeting suicidal ideations. She states not acting on SI due to wanting to be here for her daughter. She also called her boyfriend who was very supportive yesterday. Therapist and patient discuss possibility of hospitalization.  Patient tates sometimes wanting to go to hospital but says she has no one to take care of daughter. She is hopeful she will feel better after participating in activity; today. She reports being overwhelmed earlier this morning but being being pleased with decision to come to her appointment.  She also reports plans to do errands with a friend after appointment.   Suicidal/Homicidal: Patient reports fleeting suicidal ideations this weekend and earlier today but states she wouldn't act on on them due to wanting to be here for her daughter. She denies current suicidal ideations  Patient agrees to call this practice, call 911, or have someone take her to the ER should symptoms worsen.  Therapist Response: Therapist works with patient to praise her efforts to engage in healthy activity, identify coping statements, identify ways to use support system, identify ways to increase involvement in activity, provide patient with contact information regarding possible therapists and services for her daughter,encourage patient to practice relaxation techniques and grounding techniques daily. Therapist also works with patient to have name placed on waiting list for an earlier appointment with psychiatrist Dr. Harrington Challenger.  Plan: Return again in 1 week.  Diagnosis: Axis I: MDD, Recurrent    Axis II: Borderline Personality Dis.    [place], [place] 11/14/2014

## 2014-11-21 ENCOUNTER — Ambulatory Visit (INDEPENDENT_AMBULATORY_CARE_PROVIDER_SITE_OTHER): Payer: Medicare Other | Admitting: Psychiatry

## 2014-11-21 DIAGNOSIS — F331 Major depressive disorder, recurrent, moderate: Secondary | ICD-10-CM | POA: Diagnosis not present

## 2014-11-21 NOTE — Patient Instructions (Signed)
Discussed orally 

## 2014-11-21 NOTE — Progress Notes (Signed)
       THERAPIST PROGRESS NOTE  Session Time: Monday 11/21/2014 9:05 AM - 10:00 AM  Participation Level: Active  Behavioral Response: CasualAlert/less depressed  Type of Therapy: Individual Therapy  Treatment Goals addressed:  Increase self acceptance  Improve ability to manage stress and anxiety we decreased intensity and frequency of anxiety response ( panic attacks, avoidance) being able to go places and doing errands  Improve mood and resume normal interest in activities increasing involvement and staying out of bed.   Interventions: CBT and Supportive  Summary: [name] is a 48 y.o. female who presents with a long-standing history of chronic recurrent severe depressive symptoms accompanied by chronic anxiety. She has had multiple hospitalizations due to to suicidal attempts and depression. Her current symptoms include depressed mood, anxiety, excessive worrying, panic attacks, and passive suicidal ideations.   Patient reports decreased worry about daughter since last session as she talked with the school counselor about providing services for daughter who is scheduled to begin seeing counselor regularly. Mother reports daughter has shared more about her feelings and that that daughter's negative behaviors have decreased. Patient reports having a rough weekend as daughter stayed overnight with her father and patient was alone. Patient reports difficulty being alone, feeling more depressed during those times, and having fleeting suicidal ideations but being able to control. She reports being more observant of thought patterns.  She reports feeling better today and pushing herself to stay involved in activity. She reports having difficulty with the holidays as she usually doesn't want to attend family functions but feels better once she does.  Suicidal/Homicidal: Patient reports fleeting suicidal ideations this weekend she wouldn't act on on them due to wanting to be here for her  daughter. She denies current suicidal ideations  Patient agrees to call this practice, call 911, or have someone take her to the ER should symptoms worsen.  Therapist Response: Therapist works with patient to praise her efforts to engage in healthy activity, identify ways to increase mindfulness by discussing the states of mind, identify ways to use "wise mind". Patient's name has been placed on waiting list for an earlier appointment with psychiatrist Dr. Harrington Challenger.  Plan: Return again in 1 week.  Diagnosis: Axis I: MDD, Recurrent    Axis II: Borderline Personality Dis.    [place], [place] 11/21/2014

## 2014-11-28 ENCOUNTER — Ambulatory Visit (INDEPENDENT_AMBULATORY_CARE_PROVIDER_SITE_OTHER): Payer: Medicare Other | Admitting: Psychiatry

## 2014-11-28 DIAGNOSIS — F331 Major depressive disorder, recurrent, moderate: Secondary | ICD-10-CM | POA: Diagnosis not present

## 2014-11-28 NOTE — Progress Notes (Signed)
         THERAPIST PROGRESS NOTE  Session Time: Monday 11/28/2014 9:05 AM - 9:55 AM  Participation Level: Active  Behavioral Response: CasualAlert/less depressed/less anxious  Type of Therapy: Individual Therapy  Treatment Goals addressed:  Increase self acceptance  Improve ability to manage stress and anxiety we decreased intensity and frequency of anxiety response ( panic attacks, avoidance) being able to go places and doing errands  Improve mood and resume normal interest in activities increasing involvement and staying out of bed.   Interventions: CBT and Supportive  Summary: [name] is a 48 y.o. female who presents with a long-standing history of chronic recurrent severe depressive symptoms accompanied by chronic anxiety. She has had multiple hospitalizations due to to suicidal attempts and depression. Her current symptoms include depressed mood, anxiety, excessive worrying, panic attacks, and passive suicidal ideations.   Patient reports decreased stress, decreased anxiety, and less depressed mood since last session. She denies suicidal ideations and any panic attacks since last session. She reports having migraine headaches and being confined to her bed a significant amount of time since last session.  She plans to talk with her PCP regarding this. She reports having support from her boyfriend and family regarding providing care for her daughter during that time. She cites examples of using wise mind rather than emotional mind in accepting that she was unable to participate in some activities due to the migraines and participating in activities as the intensity decreased. Patient reports increased awareness of thoughts and her pattern of being judgmental and critical of self.  Suicidal/Homicidal: No  Therapist Response: Therapist works with patient to praise her efforts to use wise mind, discuss ways to avoid being judgmental of self, to be mindful, and to select  effective coping strategies  Plan: Return again in 2 weeks.   Diagnosis: Axis I: MDD, Recurrent    Axis II: Borderline Personality Dis.    [place], [place] 11/28/2014

## 2014-11-28 NOTE — Patient Instructions (Signed)
Discussed orally 

## 2014-11-29 ENCOUNTER — Ambulatory Visit (INDEPENDENT_AMBULATORY_CARE_PROVIDER_SITE_OTHER): Payer: Medicare Other | Admitting: Psychiatry

## 2014-11-29 ENCOUNTER — Encounter (HOSPITAL_COMMUNITY): Payer: Self-pay | Admitting: Psychiatry

## 2014-11-29 VITALS — BP 115/71 | HR 90 | Ht 64.0 in | Wt 206.0 lb

## 2014-11-29 DIAGNOSIS — F329 Major depressive disorder, single episode, unspecified: Secondary | ICD-10-CM | POA: Diagnosis not present

## 2014-11-29 DIAGNOSIS — F331 Major depressive disorder, recurrent, moderate: Secondary | ICD-10-CM

## 2014-11-29 MED ORDER — ARIPIPRAZOLE 2 MG PO TABS: 2.0000 mg | ORAL_TABLET | Freq: Every day | ORAL | Status: DC

## 2014-11-29 MED ORDER — ALPRAZOLAM 1 MG PO TABS: 1.0000 mg | ORAL_TABLET | Freq: Every day | ORAL | Status: DC | PRN

## 2014-11-29 MED ORDER — CLONAZEPAM 0.5 MG PO TABS
0.5000 mg | ORAL_TABLET | Freq: Three times a day (TID) | ORAL | Status: DC
Start: 2014-11-29 — End: 2015-03-29

## 2014-11-29 MED ORDER — VENLAFAXINE HCL ER 150 MG PO CP24: 150.0000 mg | ORAL_CAPSULE | Freq: Every day | ORAL | Status: DC

## 2014-11-29 MED ORDER — VENLAFAXINE HCL ER 75 MG PO CP24: 75.0000 mg | ORAL_CAPSULE | Freq: Every day | ORAL | Status: DC
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
## 2014-12-12 ENCOUNTER — Ambulatory Visit (HOSPITAL_COMMUNITY): Payer: Self-pay | Admitting: Psychiatry

## 2014-12-13 ENCOUNTER — Telehealth (HOSPITAL_COMMUNITY): Payer: Self-pay | Admitting: *Deleted

## 2014-12-14 ENCOUNTER — Telehealth (HOSPITAL_COMMUNITY): Payer: Self-pay | Admitting: *Deleted

## 2014-12-26 ENCOUNTER — Ambulatory Visit (INDEPENDENT_AMBULATORY_CARE_PROVIDER_SITE_OTHER): Payer: Medicare Other | Admitting: Psychiatry

## 2014-12-26 DIAGNOSIS — F331 Major depressive disorder, recurrent, moderate: Secondary | ICD-10-CM | POA: Diagnosis not present

## 2014-12-26 NOTE — Progress Notes (Signed)
           THERAPIST PROGRESS NOTE  Session Time: Monday 12/26/2014 10:15 AM - 11:00 AM  Participation Level: Active  Behavioral Response: CasualAlert/less depressed/less anxious  Type of Therapy: Individual Therapy  Treatment Goals addressed:  Increase self acceptance  Improve ability to manage stress and anxiety we decreased intensity and frequency of anxiety response ( panic attacks, avoidance) being able to go places and doing errands  Improve mood and resume normal interest in activities increasing involvement and staying out of bed.   Interventions: CBT and Supportive  Summary: FENIX RUPPE is a 48 y.o. female who presents with a long-standing history of chronic recurrent severe depressive symptoms accompanied by chronic anxiety. She has had multiple hospitalizations due to to suicidal attempts and depression. Her current symptoms include depressed mood, anxiety, excessive worrying, panic attacks, and passive suicidal ideations.   Patient reports having a meltdown since last session due to being depressed and having to treat daughter for lice. She reports eventually being able to pull self together and manage in a healthy way. She expresses frustration and resentment with daughter's father who is not as involvement in daughter's life as patient wishes. She also worries about his relationship with with their son.   Suicidal/Homicidal: No  Therapist Response: Therapist works with patient to process feelings, review techniques to use wise mind, discuss ways to avoid being judgmental of self, to be mindful, and to select effective coping strategies  Plan: Return again in 3 weeks.   Diagnosis: Axis I: MDD, Recurrent    Axis II: Borderline Personality Dis.    [place], [place] 12/26/2014

## 2014-12-26 NOTE — Patient Instructions (Signed)
Discussed orally 

## 2014-12-27 ENCOUNTER — Other Ambulatory Visit: Payer: Self-pay | Admitting: Obstetrics & Gynecology

## 2014-12-31 ENCOUNTER — Emergency Department (HOSPITAL_COMMUNITY)
Admission: EM | Admit: 2014-12-31 | Discharge: 2014-12-31 | Disposition: A | Discharge status: Home / Self Care | Payer: Medicare Other | Attending: Emergency Medicine | Admitting: Emergency Medicine

## 2014-12-31 ENCOUNTER — Encounter (HOSPITAL_COMMUNITY): Payer: Self-pay

## 2014-12-31 DIAGNOSIS — Z8739 Personal history of other diseases of the musculoskeletal system and connective tissue: Secondary | ICD-10-CM | POA: Diagnosis not present

## 2014-12-31 DIAGNOSIS — I10 Essential (primary) hypertension: Secondary | ICD-10-CM | POA: Diagnosis not present

## 2014-12-31 DIAGNOSIS — J029 Acute pharyngitis, unspecified: Secondary | ICD-10-CM | POA: Insufficient documentation

## 2014-12-31 DIAGNOSIS — F419 Anxiety disorder, unspecified: Secondary | ICD-10-CM | POA: Insufficient documentation

## 2014-12-31 DIAGNOSIS — E119 Type 2 diabetes mellitus without complications: Secondary | ICD-10-CM | POA: Diagnosis not present

## 2014-12-31 DIAGNOSIS — Z79899 Other long term (current) drug therapy: Secondary | ICD-10-CM | POA: Diagnosis not present

## 2014-12-31 DIAGNOSIS — E785 Hyperlipidemia, unspecified: Secondary | ICD-10-CM | POA: Insufficient documentation

## 2014-12-31 DIAGNOSIS — F329 Major depressive disorder, single episode, unspecified: Secondary | ICD-10-CM | POA: Diagnosis not present

## 2014-12-31 LAB — RAPID STREP SCREEN (MED CTR MEBANE ONLY): STREPTOCOCCUS, GROUP A SCREEN (DIRECT): NEGATIVE

## 2014-12-31 NOTE — ED Provider Notes (Signed)
CSN: [id number]     Arrival date & time 12/31/14  0442 History   First MD Initiated Contact with Patient 12/31/14 0500     Chief Complaint  Patient presents with  . Sore Throat     Patient is a 49 y.o. female presenting with pharyngitis. The history is provided by the patient.  Sore Throat This is a new problem. The current episode started 2 days ago. The problem occurs daily. The problem has been gradually worsening. Pertinent negatives include no chest pain, no abdominal pain, no headaches and no shortness of breath. The symptoms are aggravated by swallowing. Nothing relieves the symptoms.   Pt reports cough/congestion for 2 weeks It had been improving but 2 days ago she noticed sore throat No fever/vomiting  Past Medical History  Diagnosis Date  . Hypertension   . Diabetes mellitus   . Arthritis   . Anxiety   . Headache(784.0)   . History of borderline personality disorder   . Hyperlipidemia   . Depression   . Personality disorder    Past Surgical History  Procedure Laterality Date  . Foot surgery    . Total abdominal hysterectomy w/ bilateral salpingoophorectomy     Family History  Problem Relation Age of Onset  . Depression Mother   . OCD Other   . ADD / ADHD Neg Hx   . Alcohol abuse Neg Hx   . Drug abuse Neg Hx   . Anxiety disorder Neg Hx   . Bipolar disorder Neg Hx   . Dementia Neg Hx   . Paranoid behavior Neg Hx   . Schizophrenia Neg Hx   . Seizures Neg Hx   . Sexual abuse Neg Hx   . Physical abuse Neg Hx   . Ovarian cancer Sister   . Cirrhosis Sister    History  Substance Use Topics  . Smoking status: Never Smoker   . Smokeless tobacco: Never Used  . Alcohol Use: No   OB History    No data available     Review of Systems  Constitutional: Negative for fever.  HENT: Positive for sore throat.   Respiratory: Negative for shortness of breath.   Cardiovascular: Negative for chest pain.  Gastrointestinal: Negative for abdominal pain.  Neurological:  Negative for headaches.      Allergies  Neurontin; Lamictal; and Trazodone and nefazodone  Home Medications   Prior to Admission medications   Medication Sig Start Date End Date Taking? Authorizing Provider  ALPRAZolam Duanne Moron) 1 MG tablet Take 1 tablet (1 mg total) by mouth daily as needed. 11/29/14 11/29/15  Levonne Spiller, MD  ARIPiprazole (ABILIFY) 2 MG tablet Take 1 tablet (2 mg total) by mouth daily. 11/29/14   Levonne Spiller, MD  Canagliflozin [facility]) 100 MG TABS Take by mouth.    Historical Provider, MD  clonazePAM (KLONOPIN) 0.5 MG tablet Take 1 tablet (0.5 mg total) by mouth 3 (three) times daily. 11/29/14   Levonne Spiller, MD  estradiol (ESTRACE) 2 MG tablet take 1 tablet by mouth once daily 09/23/14   Florian Buff, MD  lisinopril (PRINIVIL,ZESTRIL) 20 MG tablet Take 1 tablet (20 mg total) by mouth every morning. 07/19/13   Nena Polio, PA-C  medroxyPROGESTERone (PROVERA) 5 MG tablet Take 0.5 tablets (2.5 mg total) by mouth every morning. 07/19/13   Nena Polio, PA-C  medroxyPROGESTERone (PROVERA) 5 MG tablet TAKE 1/2 TABLET BY MOUTH ONCE DAILY 12/27/14   Florian Buff, MD  metFORMIN (GLUCOPHAGE) 500 MG tablet Take 1 tablet (  500 mg total) by mouth 2 (two) times daily with a meal. For diabetes control 04/26/13   Elmarie Shiley, NP  metoprolol tartrate (LOPRESSOR) 25 MG tablet Take 1 tablet (25 mg total) by mouth 2 (two) times daily. For hypertension 04/26/13   Elmarie Shiley, NP  pravastatin (PRAVACHOL) 40 MG tablet Take 40 mg by mouth daily.    Historical Provider, MD  venlafaxine XR (EFFEXOR-XR) 150 MG 24 hr capsule Take 1 capsule (150 mg total) by mouth daily. 11/29/14 11/29/15  Levonne Spiller, MD  venlafaxine XR (EFFEXOR-XR) 75 MG 24 hr capsule Take 1 capsule (75 mg total) by mouth daily with breakfast. 11/29/14   Levonne Spiller, MD   BP 153/79 mmHg  Pulse 87  Temp(Src) 97.6 F (36.4 C) (Oral)  Resp 18  Ht 5\' 5"  (1.651 m)  Wt 210 lb (95.255 kg)  BMI 34.95 kg/m2  SpO2 99% Physical  Exam CONSTITUTIONAL: Well developed/well nourished HEAD: Normocephalic/atraumatic EYES: EOMI/PERRL ENMT: Mucous membranes moist, uvula midline.  Erythema noted to oropharynx.  No exudates noted NECK: supple no meningeal signs CV: S1/S2 noted, no murmurs/rubs/gallops noted LUNGS: Lungs are clear to auscultation bilaterally, no apparent distress ABDOMEN: soft, nontender, no rebound or guarding, bowel sounds noted throughout abdomen NEURO: Pt is awake/alert/appropriate, moves all extremitiesx4.  No facial droop.   EXTREMITIES: full ROM SKIN: warm, color normal PSYCH: no abnormalities of mood noted, alert and oriented to situation  ED Course  Procedures  Labs Review Labs Reviewed  RAPID STREP SCREEN  CULTURE, GROUP A STREP    Pt well appearing, nontoxic, suspect viral illness Appropriate for d/c home  MDM   Final diagnoses:  Pharyngitis    Nursing notes including past medical history and social history reviewed and considered in documentation Labs/vital reviewed myself and considered during evaluation     Sharyon Cable, MD 12/31/14 [phone]

## 2014-12-31 NOTE — ED Notes (Signed)
Sore throat for 2 weeks, markedly worse since yesterday. Also associated URI sx with no relief with OTC cold meds

## 2015-01-02 DIAGNOSIS — E119 Type 2 diabetes mellitus without complications: Secondary | ICD-10-CM | POA: Diagnosis not present

## 2015-01-02 DIAGNOSIS — I10 Essential (primary) hypertension: Secondary | ICD-10-CM | POA: Diagnosis not present

## 2015-01-02 DIAGNOSIS — E785 Hyperlipidemia, unspecified: Secondary | ICD-10-CM | POA: Diagnosis not present

## 2015-01-02 LAB — CULTURE, GROUP A STREP

## 2015-01-04 DIAGNOSIS — E119 Type 2 diabetes mellitus without complications: Secondary | ICD-10-CM | POA: Diagnosis not present

## 2015-01-04 DIAGNOSIS — G43009 Migraine without aura, not intractable, without status migrainosus: Secondary | ICD-10-CM | POA: Diagnosis not present

## 2015-01-04 DIAGNOSIS — E782 Mixed hyperlipidemia: Secondary | ICD-10-CM | POA: Diagnosis not present

## 2015-01-04 DIAGNOSIS — I10 Essential (primary) hypertension: Secondary | ICD-10-CM | POA: Diagnosis not present

## 2015-01-09 ENCOUNTER — Encounter (HOSPITAL_COMMUNITY): Payer: Self-pay | Admitting: Psychiatry

## 2015-01-09 ENCOUNTER — Ambulatory Visit (HOSPITAL_COMMUNITY): Payer: Self-pay | Admitting: Psychiatry

## 2015-01-10 ENCOUNTER — Ambulatory Visit (HOSPITAL_COMMUNITY): Payer: Self-pay | Admitting: Psychiatry

## 2015-01-16 ENCOUNTER — Ambulatory Visit (HOSPITAL_COMMUNITY): Payer: Self-pay | Admitting: Psychiatry

## 2015-01-17 ENCOUNTER — Ambulatory Visit (INDEPENDENT_AMBULATORY_CARE_PROVIDER_SITE_OTHER): Payer: Medicare Other | Admitting: Psychiatry

## 2015-01-17 ENCOUNTER — Encounter (HOSPITAL_COMMUNITY): Payer: Self-pay | Admitting: Psychiatry

## 2015-01-17 VITALS — BP 135/83 | HR 89 | Ht 65.0 in | Wt 219.0 lb

## 2015-01-17 DIAGNOSIS — F329 Major depressive disorder, single episode, unspecified: Secondary | ICD-10-CM | POA: Diagnosis not present

## 2015-01-17 DIAGNOSIS — F331 Major depressive disorder, recurrent, moderate: Secondary | ICD-10-CM

## 2015-01-17 MED ORDER — VENLAFAXINE HCL ER 150 MG PO CP24: ORAL_CAPSULE | ORAL | Status: DC

## 2015-01-17 MED ORDER — ARIPIPRAZOLE 2 MG PO TABS: 2.0000 mg | ORAL_TABLET | Freq: Every day | ORAL | Status: DC
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
## 2015-01-30 ENCOUNTER — Ambulatory Visit (HOSPITAL_COMMUNITY): Payer: Self-pay | Admitting: Psychiatry

## 2015-01-30 ENCOUNTER — Telehealth (HOSPITAL_COMMUNITY): Payer: Self-pay | Admitting: Psychiatry

## 2015-01-30 ENCOUNTER — Telehealth (HOSPITAL_COMMUNITY): Payer: Self-pay | Admitting: *Deleted

## 2015-01-30 NOTE — Telephone Encounter (Signed)
Therapist returned patient's call. Patient informed therapist she had missed appointments as she was so depressed she didn't feel like getting out of be.  Patient and therapist discussed ways patient could try to attend appointment. Patient agreed to schedule an appointment and to use support system by asking  a friend to bring her to next appointment. Therapist praised patient's efforts in calling and providing information regarding reason for her absence. Patient denies current suicidal ideations and agrees to call this practice, call 911, or have someone take her to the ER should symptoms worsen.

## 2015-01-30 NOTE — Telephone Encounter (Signed)
Pt called wanting [name] to call her. Per pt she just needed to let Vickii Chafe know why she missed her appt this morning 01-30-15. Pt did not go into details. Pt number is [phone].

## 2015-01-31 ENCOUNTER — Ambulatory Visit (INDEPENDENT_AMBULATORY_CARE_PROVIDER_SITE_OTHER): Payer: Medicare Other | Admitting: Psychiatry

## 2015-01-31 ENCOUNTER — Emergency Department (HOSPITAL_COMMUNITY)
Admission: EM | Admit: 2015-01-31 | Discharge: 2015-01-31 | Disposition: A | Discharge status: Other Acute Inpatient Hospital | Payer: Medicare Other | Source: Home / Self Care | Attending: Emergency Medicine | Admitting: Emergency Medicine

## 2015-01-31 ENCOUNTER — Inpatient Hospital Stay (HOSPITAL_COMMUNITY)
Admission: AD | Admit: 2015-01-31 | Discharge: 2015-02-05 | DRG: 885 | Disposition: A | Discharge status: Home / Self Care | Payer: Medicare Other | Source: Intra-hospital | Attending: Psychiatry | Admitting: Psychiatry

## 2015-01-31 ENCOUNTER — Encounter (HOSPITAL_COMMUNITY): Payer: Self-pay | Admitting: *Deleted

## 2015-01-31 ENCOUNTER — Encounter (HOSPITAL_COMMUNITY): Payer: Self-pay | Admitting: Emergency Medicine

## 2015-01-31 DIAGNOSIS — F332 Major depressive disorder, recurrent severe without psychotic features: Principal | ICD-10-CM | POA: Diagnosis present

## 2015-01-31 DIAGNOSIS — E119 Type 2 diabetes mellitus without complications: Secondary | ICD-10-CM | POA: Diagnosis not present

## 2015-01-31 DIAGNOSIS — Z79899 Other long term (current) drug therapy: Secondary | ICD-10-CM

## 2015-01-31 DIAGNOSIS — F32A Depression, unspecified: Secondary | ICD-10-CM

## 2015-01-31 DIAGNOSIS — I10 Essential (primary) hypertension: Secondary | ICD-10-CM | POA: Diagnosis not present

## 2015-01-31 DIAGNOSIS — R45851 Suicidal ideations: Secondary | ICD-10-CM

## 2015-01-31 DIAGNOSIS — Z8739 Personal history of other diseases of the musculoskeletal system and connective tissue: Secondary | ICD-10-CM | POA: Insufficient documentation

## 2015-01-31 DIAGNOSIS — F329 Major depressive disorder, single episode, unspecified: Secondary | ICD-10-CM

## 2015-01-31 DIAGNOSIS — E785 Hyperlipidemia, unspecified: Secondary | ICD-10-CM

## 2015-01-31 DIAGNOSIS — R51 Headache: Secondary | ICD-10-CM

## 2015-01-31 LAB — URINALYSIS, ROUTINE W REFLEX MICROSCOPIC
BILIRUBIN URINE: NEGATIVE
Glucose, UA: >1000 mg/dL — AB
KETONES UR: NEGATIVE mg/dL
NITRITE: NEGATIVE
Protein, ur: NEGATIVE mg/dL
SPECIFIC GRAVITY, URINE: 1.025 (ref 1.005–1.030)
Urobilinogen, UA: 0.2 mg/dL (ref 0.0–1.0)
pH: 5.5 (ref 5.0–8.0)

## 2015-01-31 LAB — BASIC METABOLIC PANEL
Anion gap: 6 (ref 5–15)
BUN: 15 mg/dL (ref 6–23)
CHLORIDE: 105 mmol/L (ref 96–112)
CO2: 25 mmol/L (ref 19–32)
Calcium: 9.9 mg/dL (ref 8.4–10.5)
Creatinine, Ser: 0.72 mg/dL (ref 0.50–1.10)
GFR calc non Af Amer: >90 mL/min (ref >90)
GLUCOSE: 155 mg/dL — AB (ref 70–99)
POTASSIUM: 4.4 mmol/L (ref 3.5–5.1)
Sodium: 136 mmol/L (ref 135–145)

## 2015-01-31 LAB — CBC WITH DIFFERENTIAL/PLATELET
Basophils Absolute: 0 10*3/uL (ref 0.0–0.1)
Basophils Relative: 0 % (ref 0–1)
EOS PCT: 2 % (ref 0–5)
Eosinophils Absolute: 0.2 10*3/uL (ref 0.0–0.7)
HCT: 43.7 % (ref 36.0–46.0)
Hemoglobin: 14.1 g/dL (ref 12.0–15.0)
LYMPHS PCT: 32 % (ref 12–46)
Lymphs Abs: 2.9 10*3/uL (ref 0.7–4.0)
MCH: 26.2 pg (ref 26.0–34.0)
MCHC: 32.3 g/dL (ref 30.0–36.0)
MCV: 81.1 fL (ref 78.0–100.0)
MONO ABS: 0.5 10*3/uL (ref 0.1–1.0)
MONOS PCT: 6 % (ref 3–12)
NEUTROS ABS: 5.4 10*3/uL (ref 1.7–7.7)
Neutrophils Relative %: 60 % (ref 43–77)
PLATELETS: 223 10*3/uL (ref 150–400)
RBC: 5.39 MIL/uL — AB (ref 3.87–5.11)
RDW: 14.2 % (ref 11.5–15.5)
WBC: 9.1 10*3/uL (ref 4.0–10.5)

## 2015-01-31 LAB — RAPID URINE DRUG SCREEN, HOSP PERFORMED
Amphetamines: NOT DETECTED
BARBITURATES: NOT DETECTED
Benzodiazepines: POSITIVE — AB
Cocaine: NOT DETECTED
Opiates: NOT DETECTED
TETRAHYDROCANNABINOL: NOT DETECTED

## 2015-01-31 LAB — URINE MICROSCOPIC-ADD ON

## 2015-01-31 LAB — ETHANOL

## 2015-01-31 LAB — GLUCOSE, CAPILLARY: Glucose-Capillary: 179 mg/dL — ABNORMAL HIGH (ref 70–99)

## 2015-01-31 LAB — SALICYLATE LEVEL

## 2015-01-31 LAB — ACETAMINOPHEN LEVEL: Acetaminophen (Tylenol), Serum: <10 ug/mL — ABNORMAL LOW (ref 10–30)

## 2015-01-31 MED ORDER — VENLAFAXINE HCL ER 37.5 MG PO CP24: 150.0000 mg | ORAL_CAPSULE | Freq: Every day | ORAL | Status: DC

## 2015-01-31 MED ORDER — HYDROXYZINE HCL 50 MG PO TABS
50.0000 mg | ORAL_TABLET | Freq: Every evening | ORAL | Status: DC | PRN
  Administered 2015-01-31 – 2015-02-01 (×2): 50 mg via ORAL
  Filled 2015-01-31 (×8): qty 1

## 2015-01-31 MED ORDER — ALUM & MAG HYDROXIDE-SIMETH 200-200-20 MG/5ML PO SUSP: 30.0000 mL | ORAL | Status: DC | PRN

## 2015-01-31 MED ORDER — PRAVASTATIN SODIUM 40 MG PO TABS
40.0000 mg | ORAL_TABLET | Freq: Every day | ORAL | Status: DC
  Administered 2015-02-01 – 2015-02-05 (×5): 40 mg via ORAL
  Filled 2015-01-31 (×7): qty 1

## 2015-01-31 MED ORDER — ACETAMINOPHEN 325 MG PO TABS: 650.0000 mg | ORAL_TABLET | Freq: Four times a day (QID) | ORAL | Status: DC | PRN

## 2015-01-31 MED ORDER — METOPROLOL TARTRATE 25 MG PO TABS: 25.0000 mg | ORAL_TABLET | Freq: Two times a day (BID) | ORAL | Status: DC

## 2015-01-31 MED ORDER — ARIPIPRAZOLE 2 MG PO TABS
2.0000 mg | ORAL_TABLET | Freq: Every day | ORAL | Status: DC
  Filled 2015-01-31 (×3): qty 1

## 2015-01-31 MED ORDER — CANAGLIFLOZIN 100 MG PO TABS
100.0000 mg | ORAL_TABLET | Freq: Every day | ORAL | Status: DC
  Filled 2015-01-31: qty 1

## 2015-01-31 MED ORDER — VENLAFAXINE HCL ER 150 MG PO CP24
150.0000 mg | ORAL_CAPSULE | Freq: Every day | ORAL | Status: DC
  Filled 2015-01-31: qty 1

## 2015-01-31 MED ORDER — ARIPIPRAZOLE 2 MG PO TABS
2.0000 mg | ORAL_TABLET | Freq: Every day | ORAL | Status: DC
  Filled 2015-01-31: qty 1

## 2015-01-31 MED ORDER — ESTRADIOL 1 MG PO TABS
2.0000 mg | ORAL_TABLET | Freq: Every day | ORAL | Status: DC
  Filled 2015-01-31 (×3): qty 2

## 2015-01-31 MED ORDER — INSULIN ASPART 100 UNIT/ML ~~LOC~~ SOLN: 0.0000 [IU] | Freq: Three times a day (TID) | SUBCUTANEOUS | Status: DC

## 2015-01-31 MED ORDER — CANAGLIFLOZIN 100 MG PO TABS
100.0000 mg | ORAL_TABLET | Freq: Every day | ORAL | Status: DC
  Administered 2015-02-01 – 2015-02-05 (×5): 100 mg via ORAL
  Filled 2015-01-31 (×7): qty 1

## 2015-01-31 MED ORDER — METFORMIN HCL 500 MG PO TABS
500.0000 mg | ORAL_TABLET | Freq: Two times a day (BID) | ORAL | Status: DC
  Filled 2015-01-31: qty 1

## 2015-01-31 MED ORDER — METFORMIN HCL 500 MG PO TABS
500.0000 mg | ORAL_TABLET | Freq: Two times a day (BID) | ORAL | Status: DC
  Administered 2015-01-31: 500 mg via ORAL
  Filled 2015-01-31: qty 1

## 2015-01-31 MED ORDER — VENLAFAXINE HCL ER 150 MG PO CP24
150.0000 mg | ORAL_CAPSULE | Freq: Two times a day (BID) | ORAL | Status: DC
  Administered 2015-01-31 – 2015-02-01 (×2): 150 mg via ORAL
  Filled 2015-01-31 (×7): qty 1

## 2015-01-31 MED ORDER — PRAVASTATIN SODIUM 40 MG PO TABS
40.0000 mg | ORAL_TABLET | Freq: Every day | ORAL | Status: DC
  Filled 2015-01-31 (×2): qty 1

## 2015-01-31 MED ORDER — ARIPIPRAZOLE 2 MG PO TABS
2.0000 mg | ORAL_TABLET | Freq: Every day | ORAL | Status: DC
  Administered 2015-01-31: 2 mg via ORAL
  Filled 2015-01-31 (×3): qty 1

## 2015-01-31 MED ORDER — VENLAFAXINE HCL ER 37.5 MG PO CP24: 75.0000 mg | ORAL_CAPSULE | Freq: Every day | ORAL | Status: DC

## 2015-01-31 MED ORDER — METOPROLOL TARTRATE 25 MG PO TABS
25.0000 mg | ORAL_TABLET | Freq: Two times a day (BID) | ORAL | Status: DC
  Filled 2015-01-31 (×2): qty 1

## 2015-01-31 MED ORDER — LISINOPRIL 10 MG PO TABS: 20.0000 mg | ORAL_TABLET | ORAL | Status: DC

## 2015-01-31 MED ORDER — CLONAZEPAM 0.5 MG PO TABS: 0.5000 mg | ORAL_TABLET | Freq: Three times a day (TID) | ORAL | Status: DC

## 2015-01-31 MED ORDER — CLONAZEPAM 0.5 MG PO TABS
0.5000 mg | ORAL_TABLET | Freq: Three times a day (TID) | ORAL | Status: DC
  Administered 2015-01-31: 0.5 mg via ORAL
  Filled 2015-01-31: qty 1

## 2015-01-31 MED ORDER — METFORMIN HCL 500 MG PO TABS
500.0000 mg | ORAL_TABLET | Freq: Two times a day (BID) | ORAL | Status: DC
  Administered 2015-02-01 – 2015-02-05 (×9): 500 mg via ORAL
  Filled 2015-01-31 (×13): qty 1

## 2015-01-31 MED ORDER — ALUM & MAG HYDROXIDE-SIMETH 200-200-20 MG/5ML PO SUSP
30.0000 mL | ORAL | Status: DC | PRN
  Administered 2015-02-03: 30 mL via ORAL
  Filled 2015-01-31: qty 30

## 2015-01-31 MED ORDER — MAGNESIUM HYDROXIDE 400 MG/5ML PO SUSP: 30.0000 mL | Freq: Every day | ORAL | Status: DC | PRN

## 2015-01-31 MED ORDER — MEDROXYPROGESTERONE ACETATE 2.5 MG PO TABS
2.5000 mg | ORAL_TABLET | Freq: Every day | ORAL | Status: DC
  Administered 2015-02-01 – 2015-02-05 (×5): 2.5 mg via ORAL
  Filled 2015-01-31 (×7): qty 1

## 2015-01-31 MED ORDER — ESTRADIOL 2 MG PO TABS
2.0000 mg | ORAL_TABLET | Freq: Every day | ORAL | Status: DC
  Filled 2015-01-31: qty 1

## 2015-01-31 MED ORDER — PRAVASTATIN SODIUM 40 MG PO TABS
40.0000 mg | ORAL_TABLET | Freq: Every day | ORAL | Status: DC
  Filled 2015-01-31: qty 1

## 2015-01-31 MED ORDER — LISINOPRIL 20 MG PO TABS
20.0000 mg | ORAL_TABLET | ORAL | Status: DC
  Administered 2015-02-01 – 2015-02-05 (×5): 20 mg via ORAL
  Filled 2015-01-31 (×8): qty 1

## 2015-01-31 MED ORDER — ESTRADIOL 2 MG PO TABS
2.0000 mg | ORAL_TABLET | Freq: Every day | ORAL | Status: DC
  Administered 2015-02-01 – 2015-02-05 (×5): 2 mg via ORAL
  Filled 2015-01-31 (×7): qty 1

## 2015-01-31 MED ORDER — CYCLOBENZAPRINE HCL 10 MG PO TABS: 10.0000 mg | ORAL_TABLET | Freq: Three times a day (TID) | ORAL | Status: DC | PRN

## 2015-01-31 MED ORDER — CLONAZEPAM 0.5 MG PO TABS
0.5000 mg | ORAL_TABLET | Freq: Three times a day (TID) | ORAL | Status: DC
  Administered 2015-02-01 – 2015-02-02 (×4): 0.5 mg via ORAL
  Filled 2015-01-31 (×5): qty 1

## 2015-01-31 MED ORDER — LISINOPRIL 20 MG PO TABS
20.0000 mg | ORAL_TABLET | ORAL | Status: DC
  Filled 2015-01-31: qty 1

## 2015-01-31 MED ORDER — METOPROLOL TARTRATE 25 MG PO TABS
25.0000 mg | ORAL_TABLET | Freq: Two times a day (BID) | ORAL | Status: DC
  Administered 2015-01-31 – 2015-02-05 (×10): 25 mg via ORAL
  Filled 2015-01-31 (×15): qty 1

## 2015-01-31 MED ORDER — CANAGLIFLOZIN 100 MG PO TABS
100.0000 mg | ORAL_TABLET | Freq: Every day | ORAL | Status: DC
Start: 2015-02-01 — End: 2015-01-31
  Filled 2015-01-31 (×2): qty 1

## 2015-01-31 NOTE — ED Notes (Signed)
Report given to [facility] at [facility] in [place], [place].

## 2015-01-31 NOTE — Discharge Instructions (Signed)
Transfer to [facility] 
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
## 2015-01-31 NOTE — ED Notes (Signed)
Patient placed in paper scrubs and valuables locked up. Pt sent from psychiatric office, for SI. Patient states she has been "depressed" for awhile and recently her "medication was increased", but it has not helped. Patient has flat affec effect, monotoned. Speaking with patient, pt admits to current SI with a plan, of "slitting my wrist with a kitchen knife". Pt admits to prior suicide attempt "about 8 years ago" where she "took a bunch of pills", patient could not relay what types of pills just said it was "a bunch of them", states she was hospitalized "for a while", after that attempt. Patient leaves here in [place], alone with her 20 year old daughter, who she states will stay with her mother here in [place] while she gets help.

## 2015-01-31 NOTE — ED Notes (Signed)
Patient will go to [place] this afternoon when bed available per behavioral health.

## 2015-01-31 NOTE — Progress Notes (Signed)
49 year old female pt admitted on voluntary basis. Pt reports that she has been feeling depressed and suicidal and currently endorses SI but able to contract for safety on the unit. Pt reports current stressors as "medication adjustment" and "life in general". Pt appears sad and depressed on admission. Pt denies any substance abuse issues. Pt was oriented to the unit and safety maintained.

## 2015-01-31 NOTE — Tx Team (Signed)
Initial Interdisciplinary Treatment Plan   PATIENT STRESSORS: Marital or family conflict Medication change or noncompliance   PATIENT STRENGTHS: Ability for insight Average or above average intelligence Capable of independent living General fund of knowledge Motivation for treatment/growth   PROBLEM LIST: Problem List/Patient Goals Date to be addressed Date deferred Reason deferred Estimated date of resolution  Depression 01/31/15     Suicidal Ideation 01/31/15     "Depressed because life in general" 01/31/15     " I want to give my medications time to work" 01/31/15                                    DISCHARGE CRITERIA:  Ability to meet basic life and health needs Improved stabilization in mood, thinking, and/or behavior Verbal commitment to aftercare and medication compliance  PRELIMINARY DISCHARGE PLAN: Attend aftercare/continuing care group Return to previous living arrangement  PATIENT/FAMIILY INVOLVEMENT: This treatment plan has been presented to and reviewed with the patient, [name], and/or family member, .  The patient and family have been given the opportunity to ask questions and make suggestions.  [place], [place] 01/31/2015, 10:30 PM

## 2015-01-31 NOTE — Progress Notes (Signed)
            THERAPIST PROGRESS NOTE  Session Time: Tuesday 01/31/2015 1:10 PM - 1:28 PM  Participation Level: Active  Behavioral Response: CasualAlert/depressed, anxious, tearful  Type of Therapy: Individual Therapy  Treatment Goals addressed:  Increase self acceptance  Improve ability to manage stress and anxiety we decreased intensity and frequency of anxiety response ( panic attacks, avoidance) being able to go places and doing errands  Improve mood and resume normal interest in activities increasing involvement and staying out of bed.   Interventions: CBT and Supportive  Summary: RUMAISA SCHNETZER is a 49 y.o. female who presents with a long-standing history of chronic recurrent severe depressive symptoms accompanied by chronic anxiety. She has had multiple hospitalizations due to to suicidal attempts and depression. Her current symptoms include depressed mood, anxiety, excessive worrying, panic attacks, and passive suicidal ideations.   Patient is accompanied to session by her friend. Her friend states patient is in a very dark place, is unable to get out of bed, and is unable to carry out daily functioning. She has been staying with patient due to fear of leaving her alone. She reports patient has admitted to her she is having suicidal thoughts . Patient is very tearful and distraught in session. She reports not feeling safe and having suicidal ideations. Patient reports being very depressed for about 2 months.   Suicidal/Homicidal: Yes. Patient reports suicidal ideations of cutting self.   Therapist Response: Therapist works with patient and friend to discuss safety concerns. Patient agrees to go to [facility] ER for assessment and her friend agrees to take patient to ER. Therapist called triage and charge nurse at [facility] ER to notify patient would be arriving for assessment for hospitalization due to suicidal ideations.   Plan:    Diagnosis: Axis I: MDD,  Recurrent    Axis II: Borderline Personality Dis.    [place], [place] 01/31/2015

## 2015-01-31 NOTE — ED Notes (Signed)
Patient accepted to [facility] bed 307-1 to Dr Parke Poisson.

## 2015-01-31 NOTE — ED Notes (Signed)
Telepsych in progress. 

## 2015-01-31 NOTE — Patient Instructions (Signed)
Discussed orally 

## 2015-01-31 NOTE — ED Notes (Signed)
[facility] transport here at this time. Patient given her belonging to look through, patient confirmed everything is there. Belongings as well as all applicable paperwork given to [facility] transporter.

## 2015-01-31 NOTE — ED Notes (Signed)
PT stated she has had increased depression and thought of SI with a plan x2 days. PT sent to ED by psychiatric MD office from a visit today.

## 2015-01-31 NOTE — BH Assessment (Signed)
Writer informed TTS Erasmo Downer of the assessment.
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
## 2015-02-01 DIAGNOSIS — R45851 Suicidal ideations: Secondary | ICD-10-CM

## 2015-02-01 LAB — HEPATIC FUNCTION PANEL
ALT: 34 U/L (ref 0–35)
AST: 48 U/L — ABNORMAL HIGH (ref 0–37)
Albumin: 3.9 g/dL (ref 3.5–5.2)
Alkaline Phosphatase: 97 U/L (ref 39–117)
Bilirubin, Direct: <0.1 mg/dL (ref 0.0–0.5)
Total Bilirubin: 0.8 mg/dL (ref 0.3–1.2)
Total Protein: 7.5 g/dL (ref 6.0–8.3)

## 2015-02-01 LAB — LIPID PANEL
CHOLESTEROL: 196 mg/dL (ref 0–200)
HDL: 43 mg/dL (ref >39)
LDL Cholesterol: 102 mg/dL — ABNORMAL HIGH (ref 0–99)
TRIGLYCERIDES: 256 mg/dL — AB (ref <150)
Total CHOL/HDL Ratio: 4.6 RATIO
VLDL: 51 mg/dL — AB (ref 0–40)

## 2015-02-01 LAB — GLUCOSE, CAPILLARY
GLUCOSE-CAPILLARY: 118 mg/dL — AB (ref 70–99)
GLUCOSE-CAPILLARY: 119 mg/dL — AB (ref 70–99)
Glucose-Capillary: 181 mg/dL — ABNORMAL HIGH (ref 70–99)
Glucose-Capillary: 121 mg/dL — ABNORMAL HIGH (ref 70–99)

## 2015-02-01 MED ORDER — ARIPIPRAZOLE 2 MG PO TABS
4.0000 mg | ORAL_TABLET | Freq: Every day | ORAL | Status: DC
  Administered 2015-02-01 – 2015-02-04 (×4): 4 mg via ORAL
  Filled 2015-02-01 (×2): qty 10
  Filled 2015-02-01 (×6): qty 2

## 2015-02-01 MED ORDER — VENLAFAXINE HCL ER 75 MG PO CP24
225.0000 mg | ORAL_CAPSULE | Freq: Every day | ORAL | Status: DC
  Administered 2015-02-02 – 2015-02-05 (×4): 225 mg via ORAL
  Filled 2015-02-01 (×6): qty 1
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
## 2015-02-01 NOTE — BHH Group Notes (Signed)
[facility] LCSW Aftercare Discharge Planning Group Note   02/01/2015 11:28 AM  Participation Quality:  Patient invited to group but she did not get out of bed.  Concha Pyo  02/01/2015  11:28 AM

## 2015-02-01 NOTE — Progress Notes (Signed)
Patient ID: [name], female   DOB: May 24, 1966, 49 y.o.   MRN: [id number] PER STATE REGULATIONS 482.30  THIS CHART WAS REVIEWED FOR MEDICAL NECESSITY WITH RESPECT TO THE PATIENT'S ADMISSION/DURATION OF STAY.  NEXT REVIEW DATE: 02/04/15  Roma Schanz, RN, BSN CASE MANAGER

## 2015-02-01 NOTE — BHH Suicide Risk Assessment (Signed)
[facility] INPATIENT:  Family/Significant Other Suicide Prevention Education  Suicide Prevention Education:  Patient Refusal for Family/Significant Other Suicide Prevention Education: The patient [name] has refused to provide written consent for family/significant other to be provided Family/Significant Other Suicide Prevention Education during admission and/or prior to discharge.  Physician notified.  Concha Pyo 02/01/2015, 11:42 AM

## 2015-02-01 NOTE — BHH Group Notes (Signed)
[facility] LCSW Group Therapy  Emotional Regulation 1:15 - 2: 30 PM        02/01/2015  2:54 PM    Type of Therapy:  Group Therapy  Participation Level:  Patient was called out of group to meet with MD.  [name]  02/01/2015 2:54 PM
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
## 2015-02-01 NOTE — Progress Notes (Signed)
D: Pt presents with flat affect and depressed mood. Pt has minimal interaction and forwards little information. Pt reported suicidal thoughts this morning. Pt verbally contracts not to harm self. Pt would not specify to writer any stressors. Pt cautious during shift assessment and appeared worried.  A: Medications administered as ordered per MD. Verbal support given. Pt encouraged to attend groups. 15 minute checks performed for safety.  R: No complaints verbalized by pt.

## 2015-02-01 NOTE — Progress Notes (Signed)
Pt attended NA group this evening.  
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
## 2015-02-02 LAB — GLUCOSE, CAPILLARY
GLUCOSE-CAPILLARY: 179 mg/dL — AB (ref 70–99)
Glucose-Capillary: 125 mg/dL — ABNORMAL HIGH (ref 70–99)
Glucose-Capillary: 133 mg/dL — ABNORMAL HIGH (ref 70–99)
Glucose-Capillary: 141 mg/dL — ABNORMAL HIGH (ref 70–99)

## 2015-02-02 LAB — TSH: TSH: 2.122 u[IU]/mL (ref 0.350–4.500)

## 2015-02-02 LAB — HEMOGLOBIN A1C
HEMOGLOBIN A1C: 6.8 % — AB (ref 4.8–5.6)
Mean Plasma Glucose: 148 mg/dL

## 2015-02-02 MED ORDER — MIRTAZAPINE 15 MG PO TBDP
7.5000 mg | ORAL_TABLET | Freq: Every day | ORAL | Status: DC
  Administered 2015-02-02: 7.5 mg via ORAL
  Filled 2015-02-02 (×2): qty 0.5

## 2015-02-02 MED ORDER — CLONAZEPAM 0.5 MG PO TABS
0.5000 mg | ORAL_TABLET | Freq: Two times a day (BID) | ORAL | Status: DC
  Administered 2015-02-02 – 2015-02-05 (×6): 0.5 mg via ORAL
  Filled 2015-02-02 (×6): qty 1

## 2015-02-02 MED ORDER — HYDROXYZINE HCL 25 MG PO TABS
25.0000 mg | ORAL_TABLET | Freq: Every evening | ORAL | Status: DC | PRN
  Administered 2015-02-02 – 2015-02-04 (×3): 25 mg via ORAL
  Filled 2015-02-02: qty 10
  Filled 2015-02-02: qty 1
  Filled 2015-02-02: qty 10
  Filled 2015-02-02 (×3): qty 1
  Filled 2015-02-02: qty 10
  Filled 2015-02-02: qty 1
  Filled 2015-02-02: qty 10
  Filled 2015-02-02 (×3): qty 1

## 2015-02-02 NOTE — Progress Notes (Signed)
Adult Psychoeducational Group Note  Date:  02/02/2015 Time:  10:56 PM  Group Topic/Focus:  Wrap-Up Group:   The focus of this group is to help patients review their daily goal of treatment and discuss progress on daily workbooks.  Participation Level:  Active  Participation Quality:  Appropriate  Affect:  Appropriate  Cognitive:  Appropriate  Insight: Appropriate  Engagement in Group:  Engaged  Modes of Intervention:  Discussion  Additional Comments:    Rheta Hemmelgarn A 02/02/2015, 10:56 PM

## 2015-02-02 NOTE — Progress Notes (Signed)
D. Pt had been up and visible in milieu this evening, did attend and participate in various milieu activities. Pt did endorse feeling depressed and does appear depressed in the milieu. Pt spoke about some medication changes made and is hopeful this will be beneficial for her going forward. Pt did receive medications without incident and did not verbalize any complaints of pain this evening. A. Support and encouragement provided. R. Safety maintained, will continue to monitor.
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
## 2015-02-02 NOTE — Plan of Care (Signed)
Problem: Ineffective individual coping Goal: STG: Patient will remain free from self harm Outcome: Progressing Patient has previously endorsed passive SI however is able to deny this morning. Has not engaged in self harm.  Problem: Diagnosis: Increased Risk For Suicide Attempt Goal: STG-Patient Will Comply With Medication Regime Outcome: Progressing Patient has been med compliant.

## 2015-02-02 NOTE — BHH Group Notes (Signed)
[facility] [place] Group Notes:  (Nursing/MHT/Case Management/Adjunct)  Date:  02/02/2015  Time:  9:00am  Type of Therapy:  Nurse Education  Participation Level:  Did Not Attend  Participation Quality:      Affect:     Cognitive:    Insight:    Engagement in Group:    Modes of Intervention:    Summary of Progress/Problems: Patient was invited to group however elected to stay in bed.  Jamie Kato 02/02/2015, 9:40 AM

## 2015-02-02 NOTE — Progress Notes (Addendum)
Found patient resting in bed. She promptly came to nurses' station for medications. Indicated she did go to cafeteria for breakfast and slept well. She remains flat, depressed and cautious. She rates her depression at a 6/10, hopelessness at a 5/10 and anxiety at a 7/10. Forwards minimal information but is cooperative. Denies pain or problems. Patient given support and reassurance. Encouraged to attend and participate in programming however patient returned to bed during AM group. She has been endorsing passive SI however denies this morning. Denies HI/AVH and remains safe. Jamie Kato
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
## 2015-02-02 NOTE — Progress Notes (Signed)
Patient ID: [name], female   DOB: 01/30/1966, 48 y.o.   MRN: [id number] D: Client is visible on the unit, noted in dayroom playing cards, interacts appropriately with staff and peers. Client reports depression at "6"of 10. When asked about SI client reports "not like I was, thinking about cutting or anything, I just have sad thoughts" Client contracts for safety. "I have to get out by Sunday, I don't have any one to take care of my daughter" Client says daughter is 10 yo. Today's goal was "to stay out of bed" "it was half met, I been out of bed more this afternoon" A: Writer provides emotional support, encourages client to continue groups. Staff will monitor q15min for safety. R: client is safe on the unit, attends group. 

## 2015-02-02 NOTE — BHH Group Notes (Signed)
[place] LCSW Group Therapy  02/02/2015 4:20 PM  Type of Therapy:  Group Therapy  Participation Level:  Active  Participation Quality:  Attentive  Affect:  Appropriate  Cognitive:  Alert and Oriented  Insight:  Engaged  Engagement in Therapy:  Engaged  Modes of Intervention:  Confrontation, Discussion, Education, Exploration, Problem-solving, Rapport Building, Socialization and Support  Summary of Progress/Problems:  Finding Balance in Life. Today's group focused on defining balance in one's own words, identifying things that can knock one off balance, and exploring healthy ways to maintain balance in life. Group members were asked to provide an example of a time when they felt off balance, describe how they handled that situation,and process healthier ways to regain balance in the future. Group members were asked to share the most important tool for maintaining balance that they learned while at [facility] and how they plan to apply this method after discharge. Tauri was attentive and engaged during today's processing group. She shared that she feels off balance due to "guilt about wanting my sister to die. She's got cancer and has been suffering for so long." Ryka also talked about her struggles in managing depressive Sx and how this affects her 63 year old daughter. Annjanette was able to process how taking meds, openly talking with her daughter about her mental health and grief, and going to a therapist will help her manage Sx and cope better with life stress.    Smart, [place] LCSWA  02/02/2015, 4:20 PM

## 2015-02-03 LAB — GLUCOSE, CAPILLARY
GLUCOSE-CAPILLARY: 156 mg/dL — AB (ref 70–99)
Glucose-Capillary: 113 mg/dL — ABNORMAL HIGH (ref 70–99)
Glucose-Capillary: 138 mg/dL — ABNORMAL HIGH (ref 70–99)
Glucose-Capillary: 129 mg/dL — ABNORMAL HIGH (ref 70–99)

## 2015-02-03 MED ORDER — MIRTAZAPINE 15 MG PO TBDP
15.0000 mg | ORAL_TABLET | Freq: Every day | ORAL | Status: DC
  Administered 2015-02-03: 15 mg via ORAL
  Filled 2015-02-03 (×3): qty 1

## 2015-02-03 NOTE — Progress Notes (Signed)
The patient attended this evening's A.A. Meeting and was appropriate. She shared with the group that she is in the hospital due to depression rather than for substance abuse.

## 2015-02-03 NOTE — Progress Notes (Signed)
Pt refused her noon time insulin stating that she doesn't take it at home and will not take it here.

## 2015-02-03 NOTE — BHH Group Notes (Signed)
[place] LCSW Group Therapy  02/03/2015 3:11 PM  Type of Therapy:  Group Therapy  Participation Level:  Active  Participation Quality:  Attentive  Affect:  Depressed and Flat  Cognitive:  Alert and Oriented  Insight:  Improving  Engagement in Therapy:  Improving  Modes of Intervention:  Confrontation, Discussion, Education, Exploration, Problem-solving, Rapport Building, Socialization and Support  Summary of Progress/Problems: Feelings around Relapse. Group members discussed the meaning of relapse and shared personal stories of relapse, how it affected them and others, and how they perceived themselves during this time. Group members were encouraged to identify triggers, warning signs and coping skills used when facing the possibility of relapse. Social supports were discussed and explored in detail. [name] was attentive and engaged during today's processing group. She shared that she learned the importance of managing depressive and anxiety Sx before "they get out of control." [name] stated that she has trouble driving due to fear/agoraphobia and panic on the road, limiting her to aftercare in the [place] area. [name] problem solved with other pts regarding options for getting transport to [facility] for support group meetings and shows improving insight in the group setting. She shared that her family is not very supportive but that she is trying to talk to them about ways they can best support her in times of crisis.   Smart, Kitt Minardi  LCSWA   02/03/2015, 3:11 PM

## 2015-02-03 NOTE — Progress Notes (Signed)
Pt observed in her room early in the shift lying in bed reading.  She reports that she has had a good day today.  She denies SI/HI/AV.  She reports she has been attending groups.  She states she needs to discharge Sunday as she does not have child care for her daughter.  Writer reviewed meds with pt and also discussed her diabetes and her refusal to take the insulin.  Pt states that she does not want to take the insulin here as she would not use it at home.  Pt encouraged to watch her diet and limit her carbs.  Pt has been pleasant and cooperative with staff.  Support and encouragement offered.  Safety maintained with q15 minute checks.

## 2015-02-03 NOTE — BHH Group Notes (Deleted)
[facility] LCSW Aftercare Discharge Planning Group Note   02/03/2015 11:36 AM  Participation Quality:  Appropriate   Mood/Affect:  Appropriate  Depression Rating:  6  Anxiety Rating:  7  Thoughts of Suicide:  No Will you contract for safety?   NA  Current AVH:  No  Plan for Discharge/Comments:  Pt hoping for Sunday d/c. Pt plans to continue follow-up at [facility] in [place]. Appts made. Pt to return home at d/c to her 50 year old.   Transportation Means: [facility] transport to [facility]. From there, pt plans to have family member take her home.   Supports: some family supports   Proofreader, Research officer, trade union

## 2015-02-03 NOTE — BHH Group Notes (Signed)
[facility] LCSW Aftercare Discharge Planning Group Note   02/03/2015 11:29 AM  Participation Quality:  Appropriate   Mood/Affect:  Depressed and Flat  Depression Rating:  6  Anxiety Rating:  7  Thoughts of Suicide:  No Will you contract for safety?   NA  Current AVH:  No  Plan for Discharge/Comments:  Pt reports that she wants to d/c Sunday. She will follow up at [facility]. Pt to return home at d/c. Reports that her sx are lessoning and that she is starting to feel better.   Transportation Means: [facility] requested that CSW or RN call Sun morning to arrange transport. They cannot arrange today for Sunday pickup.   Supports: some family supports/ minimal   Smart, Research officer, trade union
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
## 2015-02-03 NOTE — Progress Notes (Signed)
  [facility] Adult Case Management Discharge Plan :  Will you be returning to the same living situation after discharge:  Yes,  home At discharge, do you have transportation home?: Yes,  [place]-please call sunday morning to arrange transport back to [phone]. from there, pt will have family member pick her up and take her home. D/c SUNDAY Do you have the ability to pay for your medications: Yes,  medicare  Release of information consent forms completed and submitted to medical records by CSW.  Patient to Follow up at: Follow-up Information    Follow up with Maurice Small - [facility] On 02/13/2015.   Why:  You are scheduled with Maurice Small on Monday, February 13, 2015 at 9:45   Contact information:   [address], [place]  [address]  [phone]      Follow up with Dr. Harrington Challenger - [facility] On 02/14/2015.   Why:  You are scheduled with Dr. Harrington Challenger at 8:15 on Tuesday, February 14, 2015   Contact information:   [address], [place]   [address]  [phone]      Patient denies SI/HI: Yes,  during group/self report.     Safety Planning and Suicide Prevention discussed: Yes,  SPE completed with pt, as she refused to consent to family contact. SPI pamphlet provided to pt and she was encouraged to share information with support network, ask questions, and talk about any concerns relating to SPE.  Has patient been referred to the Quitline?: N/A patient is not a smoker  Smart, Alicia Amel  02/03/2015, 11:32 AM
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
## 2015-02-04 LAB — GLUCOSE, CAPILLARY
Glucose-Capillary: 121 mg/dL — ABNORMAL HIGH (ref 70–99)
Glucose-Capillary: 161 mg/dL — ABNORMAL HIGH (ref 70–99)
Glucose-Capillary: 137 mg/dL — ABNORMAL HIGH (ref 70–99)
Glucose-Capillary: 189 mg/dL — ABNORMAL HIGH (ref 70–99)

## 2015-02-04 MED ORDER — MIRTAZAPINE 15 MG PO TBDP
7.5000 mg | ORAL_TABLET | Freq: Every evening | ORAL | Status: DC | PRN
  Administered 2015-02-04: 7.5 mg via ORAL

## 2015-02-04 NOTE — Progress Notes (Signed)
D) Pt has been attending the program and interacting with her peers. Continue to think about the female teacher that she talked about yesterday.States one of the issues that she is having is she is eating her snack maybe one hour before she takes her blood sugar and is upset about that. Feels that her sugar is increasing due to the snack. Pt rates her depression at a 5, hopelessness at a 4 and her anxiety at a 5. Denies SI and HI. A) Given support and reassurance along with praise. Provided with a 1:1. Encouragement given. Verbal assurance that Pt would come to staff should she want to hurt herself obtained.  R) Pt socializes in the dayroom with her peers. Ptresently denies SI and HI.

## 2015-02-04 NOTE — Progress Notes (Signed)
Psychoeducational Group Note  Date: 02/04/2015 Time:  1015  Group Topic/Focus:  Identifying Needs:   The focus of this group is to help patients identify their personal needs that have been historically problematic and identify healthy behaviors to address their needs.  Participation Level:  Active  Participation Quality:  Appropriate  Affect:  Appropriate  Cognitive:  Oriented  Insight:  Improving  Engagement in Group:  Engaged  Additional Comments:  Pt actively participated in group.   Paulino Rily
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
## 2015-02-04 NOTE — Psychosocial Assessment (Signed)
Goals  Group Note  Date:  02/04/2015 Time:   1000  Group Topic/Focus: The focus of this group is to help patients identify goals they want to accomplish and strategies they can use to accomplish these goals.     : Participation   Level:  active Participation Quality  : good   Affect: flat Cognitive:    Insight:  good  Engagement in Group: engaged  Additional Comments: Pt was engaged in group, asked appropriate questions, shared personal life experience with the group and demonstrates willingness to process and understand her problems. [place]

## 2015-02-04 NOTE — [facility] Group Notes (Signed)
[facility] Group Notes:  (Clinical Social Work)  02/04/2015     1:45-2:30PM  Summary of Progress/Problems:   The main focus of today's process group was to learn how to use a decisional balance exercise to move forward in the Stages of Change.  Motivational Interviewing was utilized to help patients explore in depth the perceived benefits and costs of a self-sabotaging behavior, as well as the  benefits and costs of replacing that with a healthy coping mechanism.   The patient was late to group and was thus not able to list the unhealthy coping skills she personally uses.  However, she was insightful in answering questions about the costs and benefits of a variety of unhealthy coping skills discussed during group such as isolation, people pleasing and drinking.  Her affect was fairly broad compared to writer's familiarity with her usual flat affect.  Type of Therapy:  Group Therapy - Process   Participation Level:  Active  Participation Quality:  Attentive and Sharing  Affect:  Anxious  Cognitive:  Appropriate  Insight:  Engaged  Engagement in Therapy:  Engaged  Modes of Intervention:  Education, Motivational Interviewing  Selmer Dominion, LCSW 02/04/2015, 3:15 PM

## 2015-02-05 DIAGNOSIS — F332 Major depressive disorder, recurrent severe without psychotic features: Principal | ICD-10-CM

## 2015-02-05 LAB — GLUCOSE, CAPILLARY: Glucose-Capillary: 124 mg/dL — ABNORMAL HIGH (ref 70–99)

## 2015-02-05 MED ORDER — MEDROXYPROGESTERONE ACETATE 5 MG PO TABS: 2.5000 mg | ORAL_TABLET | Freq: Every day | ORAL | Status: DC

## 2015-02-05 MED ORDER — VENLAFAXINE HCL ER 75 MG PO CP24
225.0000 mg | ORAL_CAPSULE | Freq: Every day | ORAL | Status: DC
  Filled 2015-02-05: qty 15

## 2015-02-05 MED ORDER — HYDROXYZINE HCL 25 MG PO TABS: 25.0000 mg | ORAL_TABLET | Freq: Every evening | ORAL | Status: DC | PRN

## 2015-02-05 MED ORDER — CYCLOBENZAPRINE HCL 10 MG PO TABS: 10.0000 mg | ORAL_TABLET | Freq: Three times a day (TID) | ORAL | Status: DC | PRN

## 2015-02-05 MED ORDER — PRAVASTATIN SODIUM 40 MG PO TABS: 40.0000 mg | ORAL_TABLET | Freq: Every day | ORAL | Status: DC

## 2015-02-05 MED ORDER — ESTRADIOL 2 MG PO TABS: 2.0000 mg | ORAL_TABLET | Freq: Every day | ORAL | Status: DC

## 2015-02-05 MED ORDER — CANAGLIFLOZIN 100 MG PO TABS: 100.0000 mg | ORAL_TABLET | Freq: Every day | ORAL | Status: DC

## 2015-02-05 MED ORDER — MIRTAZAPINE 7.5 MG PO TABS
7.5000 mg | ORAL_TABLET | Freq: Every day | ORAL | Status: DC
  Filled 2015-02-05: qty 5

## 2015-02-05 MED ORDER — ARIPIPRAZOLE 2 MG PO TABS: 4.0000 mg | ORAL_TABLET | Freq: Every day | ORAL | Status: DC

## 2015-02-05 MED ORDER — MIRTAZAPINE 15 MG PO TBDP: 7.5000 mg | ORAL_TABLET | Freq: Every evening | ORAL | Status: DC | PRN

## 2015-02-05 MED ORDER — VENLAFAXINE HCL ER 75 MG PO CP24: 225.0000 mg | ORAL_CAPSULE | Freq: Every day | ORAL | Status: DC

## 2015-02-05 NOTE — Progress Notes (Signed)
D) Pt is being D/C'd to home accompanied by a friend. Affect is flat and mood depressed. States that she feels that she will never really be happy. Presently denies SI and HI, delusions and hallucinations, Rates her depression and hopelessness all at a 3 and her anxiety at a 5. States that she has anxiety. A) Given support, reassurance and praise. Encouragement given. Provided with a 1:1 to continue to talk about Pt's issues and concerns. All medications explained to Pt along with her follow up plans for treatment. All belongings returned to Pt. R) Pt denies SI and HI, delusions and hallucinations.

## 2015-02-05 NOTE — Progress Notes (Signed)
Patient did attend the evening speaker AA meeting.  
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
## 2015-02-05 NOTE — Progress Notes (Signed)
D)  Has been visible in the milieu this evening, interacting with select peers, and staff.  Attended group and has been compliant with medications.  Affect is flat, depressed mood, stated she keeps thinking about something she knows she shouldn't, and said it was a relationship.  Stated was ok, contracts for safety. A)  Will continue to monitor for safety, continue POC R)  Safety maintained.
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
## 2015-02-08 NOTE — Progress Notes (Signed)
Patient Discharge Instructions:  Next Level Care Provider Has Access to the EMR, 02/08/15  Records provided to [facility] via [facility]/Epic access.  [name], 02/08/2015, 2:57 PM

## 2015-02-13 ENCOUNTER — Ambulatory Visit (HOSPITAL_COMMUNITY): Payer: Self-pay | Admitting: Psychiatry

## 2015-02-14 ENCOUNTER — Ambulatory Visit (HOSPITAL_COMMUNITY): Payer: Self-pay | Admitting: Psychiatry

## 2015-02-16 ENCOUNTER — Encounter (HOSPITAL_COMMUNITY): Payer: Self-pay | Admitting: Psychiatry

## 2015-02-16 ENCOUNTER — Ambulatory Visit (INDEPENDENT_AMBULATORY_CARE_PROVIDER_SITE_OTHER): Payer: Medicare Other | Admitting: Psychiatry

## 2015-02-16 VITALS — BP 144/94 | HR 107 | Ht 65.0 in | Wt 213.0 lb

## 2015-02-16 DIAGNOSIS — F329 Major depressive disorder, single episode, unspecified: Secondary | ICD-10-CM

## 2015-02-16 DIAGNOSIS — F332 Major depressive disorder, recurrent severe without psychotic features: Secondary | ICD-10-CM

## 2015-02-16 MED ORDER — ARIPIPRAZOLE 2 MG PO TABS: 2.0000 mg | ORAL_TABLET | Freq: Every day | ORAL | Status: DC

## 2015-02-16 MED ORDER — VENLAFAXINE HCL ER 150 MG PO CP24: 150.0000 mg | ORAL_CAPSULE | Freq: Every day | ORAL | Status: DC

## 2015-02-16 MED ORDER — DULOXETINE HCL 60 MG PO CPEP: 60.0000 mg | ORAL_CAPSULE | Freq: Every day | ORAL | Status: DC
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
## 2015-02-23 ENCOUNTER — Ambulatory Visit (INDEPENDENT_AMBULATORY_CARE_PROVIDER_SITE_OTHER): Payer: Medicare Other | Admitting: Psychiatry

## 2015-02-23 DIAGNOSIS — F332 Major depressive disorder, recurrent severe without psychotic features: Secondary | ICD-10-CM

## 2015-02-23 NOTE — Patient Instructions (Signed)
Discussed orally 

## 2015-02-23 NOTE — Progress Notes (Signed)
             THERAPIST PROGRESS NOTE  Session Time: Thursday 02/23/2015 10:00 AM - 10:53 AM  Participation Level: Active  Behavioral Response: CasualAlert/ anxious  Type of Therapy: Individual Therapy  Treatment Goals addressed:  Increase self acceptance  Improve ability to manage stress and anxiety we decreased intensity and frequency of anxiety response ( panic attacks, avoidance) being able to go places and doing errands  Improve mood and resume normal interest in activities increasing involvement and staying out of bed.   Interventions: CBT and Supportive  Summary: [name] is a 49 y.o. female who presents with a long-standing history of chronic recurrent severe depressive symptoms accompanied by chronic anxiety. She has had multiple hospitalizations due to to suicidal attempts and depression. Her current symptoms include depressed mood, anxiety, excessive worrying, panic attacks, and passive suicidal ideations.   Patient is resuming treatment after being hospitalized at the [facility] in February 2016 due to depression and suicidal ideations. She reports continuing to feel very depressed the first week after discharge. However. She reports seeing psychiatrist Dr. Harrington Challenger a week ago and feeling much better since taking Cymbalta as instructed by Dr. Harrington Challenger. She reports absence of suicidal ideations and improved mood. However, she states still not feeling completely herself and procrastinating regarding some daily household responsibilities. She is maintaining social involvement with family and friends. She recently reports doing something for herself by going out to dinner and a movie with a friend. Patient reports learning that she has to accept some things such as her sisters' health conditions. She also is trying to learn to identify and accept realistic expectations of herself as a parent. She is experiencing anxiety regarding recent requests for increase in  child support. She fears her ex-husband will contest and she may have to attend legal proceedings. She also expresses guilt about asking for more child support.   Suicidal/Homicidal: No  Therapist Response: Therapist works with patient and friend to process feelings, identify coping statements to increase acceptance, identify realistic expectations of self, identify ways to improve daily structure and achieve balance, identify and dispel inappropriate guilt by challenging cognitive distortions  Plan: Patient agrees to return for an appointment in 2 weeks.   Diagnosis: Axis I: MDD, Recurrent    Axis II: Borderline Personality Dis.    BYNUM,PEGGY, LCSW

## 2015-03-01 ENCOUNTER — Encounter (HOSPITAL_COMMUNITY): Payer: Self-pay | Admitting: Psychiatry

## 2015-03-01 ENCOUNTER — Ambulatory Visit (INDEPENDENT_AMBULATORY_CARE_PROVIDER_SITE_OTHER): Payer: Medicare Other | Admitting: Psychiatry

## 2015-03-01 VITALS — BP 130/73 | HR 90 | Ht 65.0 in | Wt 212.4 lb

## 2015-03-01 DIAGNOSIS — F332 Major depressive disorder, recurrent severe without psychotic features: Secondary | ICD-10-CM | POA: Diagnosis not present
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
## 2015-03-13 ENCOUNTER — Ambulatory Visit (INDEPENDENT_AMBULATORY_CARE_PROVIDER_SITE_OTHER): Payer: Medicare Other | Admitting: Psychiatry

## 2015-03-13 DIAGNOSIS — F332 Major depressive disorder, recurrent severe without psychotic features: Secondary | ICD-10-CM

## 2015-03-13 NOTE — Patient Instructions (Signed)
Discussed orally 

## 2015-03-13 NOTE — Progress Notes (Signed)
       THERAPIST PROGRESS NOTE  Session Time: Monday 03/13/2015 11:20 AM - 11:57 AM  Participation Level: Active  Behavioral Response: CasualAlert/ anxious  Type of Therapy: Individual Therapy  Treatment Goals addressed:  Increase self acceptance  Improve ability to manage stress and anxiety we decreased intensity and frequency of anxiety response ( panic attacks, avoidance) being able to go places and doing errands  Improve mood and resume normal interest in activities increasing involvement and staying out of bed.   Interventions: CBT and Supportive  Summary: [name] is a 49 y.o. female who presents with a long-standing history of chronic recurrent severe depressive symptoms accompanied by chronic anxiety. She has had multiple hospitalizations due to to suicidal attempts and depression. Her current symptoms include depressed mood, anxiety, excessive worrying, panic attacks, and passive suicidal ideations.   Patient reports no longer experiencing depressed mood but states having no energy and having difficulty getting up every morning. She reports staying in bed most of the time. She has been more active this morning and has completed some household chores and has gone out to breakfast with a friend. She is relieved she was able to cope with her daughter being away overnight for a school trip. She reports having one panic attack but trying to use mindfulness activities to cope. She expresses frustration her husband has not consented to pay child support. The case worker told her the case will probably go to court per patient's report. Patient reports stress related to being financially challenged this month due to increased medical bills related to recent hospitalization. She reports poor daily structure and poor sleep hygiene.  Suicidal/Homicidal: No  Therapist Response: Therapist works with patient to process feelings,identify ways to improve daily structure and achieve balance,  and review relaxation techniques  Plan: Patient agrees to return for an appointment in 2 weeks. Patient agrees to resume use of plan your day handouts and to practice diaphragmatic breathing daily.   Diagnosis: Axis I: MDD, Recurrent    Axis II: Borderline Personality Dis.    Kade Demicco, LCSW

## 2015-03-29 ENCOUNTER — Encounter (HOSPITAL_COMMUNITY): Payer: Self-pay | Admitting: Psychiatry

## 2015-03-29 ENCOUNTER — Ambulatory Visit (INDEPENDENT_AMBULATORY_CARE_PROVIDER_SITE_OTHER): Payer: Medicare Other | Admitting: Psychiatry

## 2015-03-29 VITALS — BP 122/78 | HR 86 | Ht 65.0 in | Wt 207.8 lb

## 2015-03-29 DIAGNOSIS — F329 Major depressive disorder, single episode, unspecified: Secondary | ICD-10-CM | POA: Diagnosis not present

## 2015-03-29 DIAGNOSIS — F332 Major depressive disorder, recurrent severe without psychotic features: Secondary | ICD-10-CM

## 2015-03-29 MED ORDER — CLONAZEPAM 0.5 MG PO TABS: 0.5000 mg | ORAL_TABLET | Freq: Three times a day (TID) | ORAL | Status: DC

## 2015-03-29 MED ORDER — ARIPIPRAZOLE 2 MG PO TABS: 2.0000 mg | ORAL_TABLET | Freq: Every day | ORAL | Status: DC

## 2015-03-29 MED ORDER — DULOXETINE HCL 60 MG PO CPEP: 60.0000 mg | ORAL_CAPSULE | Freq: Every day | ORAL | Status: DC

## 2015-03-29 MED ORDER — VENLAFAXINE HCL ER 150 MG PO CP24: 150.0000 mg | ORAL_CAPSULE | Freq: Every day | ORAL | Status: DC
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
## 2015-03-30 ENCOUNTER — Telehealth (HOSPITAL_COMMUNITY): Payer: Self-pay | Admitting: *Deleted

## 2015-03-30 NOTE — Telephone Encounter (Signed)
lmtcb and number provided. Called both numbers provided.

## 2015-04-03 ENCOUNTER — Ambulatory Visit (INDEPENDENT_AMBULATORY_CARE_PROVIDER_SITE_OTHER): Payer: Medicare Other | Admitting: Psychiatry

## 2015-04-03 DIAGNOSIS — F332 Major depressive disorder, recurrent severe without psychotic features: Secondary | ICD-10-CM | POA: Diagnosis not present

## 2015-04-04 NOTE — Patient Instructions (Signed)
Discussed orallly 

## 2015-04-04 NOTE — Progress Notes (Signed)
        THERAPIST PROGRESS NOTE  Session Time:  Tuesday 04/03/2015 10:05 AM - 10: 11:00 AM  Participation Level: Active  Behavioral Response: CasualAlert/ anxious  Type of Therapy: Individual Therapy  Treatment Goals addressed:  Increase self acceptance  Improve ability to manage stress and anxiety we decreased intensity and frequency of anxiety response ( panic attacks, avoidance) being able to go places and doing errands  Improve mood and resume normal interest in activities increasing involvement and staying out of bed.   Interventions: CBT and Supportive  Summary: [name] is a 49 y.o. female who presents with a long-standing history of chronic recurrent severe depressive symptoms accompanied by chronic anxiety. She has had multiple hospitalizations due to to suicidal attempts and depression. Her current symptoms include depressed mood, anxiety, excessive worrying, panic attacks, and passive suicidal ideations.   Patient reports feeling better and being a little more active since last session. She has been using handouts to plan and structure day. She also has been less critical of self. She reports stress related to recent conflict with mother who became angry with patient who forgot to do an errand for mother per patient's report. She expresses frustration and disappointment with mother who criticized patient and tried to make her feel guilty per patient's report. Patient reports she was able to be assertive with mother but then starting to feel negative about self. However, she is pleased she did not become completely distraught or suicidal as she has in the past in similar circumstances.  .Suicidal/Homicidal: No  Therapist Response: Therapist works with patient to process feelings, praiseefforts  to improve daily structure and achieve balance, identify distress tolerance skills, identify ways to improve interpersonal effectiveness   Plan: Patient agrees to return for an  appointment in 2 weeks. Patient agrees to resume use of plan your day handouts and to practice diaphragmatic breathing daily.   Diagnosis: Axis I: MDD, Recurrent    Axis II: Borderline Personality Dis.    Andros Channing, LCSW

## 2015-04-22 NOTE — H&P (Signed)
PATIENT NAME:  [name], CRASS MR#:  [id number] DATE OF BIRTH:  1966-03-09  DATE OF ADMISSION:  03/22/2014  IDENTIFYING INFORMATION AND CHIEF COMPLAINT: This is a 49 year old woman admitted to the hospital voluntarily for ECT evaluation.   CHIEF COMPLAINT: "I'm anxious."   HISTORY OF PRESENT ILLNESS: Information obtained from the patient and the chart including my previous interaction with her in my office. The patient reports symptoms of depression with severe sad and depressed mood which is constant. She also feels anxious with severe social anxiety and a lot of obsessive worry about her own problems and her daughter's problems. She feels fatigued most of the time. She occasionally has passive suicidal thoughts. She has a sense of hopelessness and helplessness. No motivation. Lots of negative thinking. She has not responded to multiple medications despite being compliant with current medicines. She had been referred by her outpatient psychiatrist, Dr. Harrington Challenger, at [facility], for ECT evaluation.   PAST PSYCHIATRIC HISTORY: Multiple hospitalizations, the most recent one several months ago at [facility]. Past history of suicide attempts by overdose, but it has been a few years since then. Multiple medications have been tried including Prozac, Effexor, Lexapro, Lamictal, Symbyax and Geodon. Some medicines have had transient positive effects but without long-lasting benefit. No past history of ECT. No past history of transcranial magnetic stimulation. No clear history of psychosis or manic symptoms.   SOCIAL HISTORY: The patient has a 74-year-old daughter who lives with her, also has a 4 year old son who lives with his father. The patient gets disability and does not work outside the home. She has fairly supportive relationships with her family of origin.   PAST MEDICAL HISTORY: The patient has mild diabetes, on oral agents. Some high blood pressure and takes hormone supplements. History of removal of  ovaries. She has had surgery in the past and has tolerated anesthesia without difficulty. No history of lung disease.   SUBSTANCE ABUSE HISTORY: Denies abuse of alcohol or any other recreational drugs or any past drug abuse. Not smoking.   FAMILY HISTORY: No family history positive for mental illness.   CURRENT MEDICATIONS: Metformin 500 mg twice a day, Effexor 150 mg in the morning and 75 mg at night, lisinopril 20 mg per day, metoprolol 25 mg twice a day, estrogen 2 mg per day, progesterone 2.5 mg per day, clonazepam 0.5 mg 3 times a day, Abilify 2 mg at night, pravastatin 20 mg per day.   ALLERGIES: No known drug allergies.   REVIEW OF SYSTEMS: Depressed mood. Low energy. Chronic general anxiety with some obsessive worry. No acute suicidal threat. No homicidal ideation. Denies hallucinations. No acute pain. No malaise. No fever. The rest of the full review of systems is negative.   MENTAL STATUS EXAMINATION: Neatly dressed and groomed woman who looks her stated age, cooperative with the interview. Eye contact good. Psychomotor activity a little slow. Speech decreased in total amount but easy to understand. Affect blunted. Mood stated as nervous. Thoughts are lucid without loosening of associations or delusions. Denies auditory or visual hallucinations. Denies suicidal intent or plan. Some hopelessness. No homicidal ideation. Alert and oriented x4. Short-term memory intact, 3 out of 3 objects. Long-term memory intact. Normal fund of knowledge.   PHYSICAL EXAMINATION: GENERAL: The patient is moderately overweight but does not appear to be in any acute distress.  SKIN: Skin lesions are not identified.  HEENT: Pupils equal and reactive. Oral mucosa dry. Normal dentition. No sign of loose teeth. No dentures.  NECK  AND BACK: Nontender and normal flexion. Full range of motion at extremities. Strength and reflexes symmetric and normal throughout. Gait within normal limits.  LUNGS: Clear without  wheezes.  HEART: Regular rate and rhythm.  ABDOMEN: Soft, nontender. Normal bowel sounds.  VITAL SIGNS: Temperature 97.8, pulse 100, respirations 18, blood pressure 129/71.   LABORATORY RESULTS: So far what has come back is the basic chemistry panel showing a slight elevation of the AST at 52, elevated glucose 158. Otherwise, no significant abnormalities. CBC unremarkable.   ASSESSMENT: A 49 year old woman with major depression, recurrent, severe, unresponsive to multiple medications. Very compromised in her functioning. Also a significant amount of anxiety disorder. The patient has been referred for ECT and accepted for evaluation and treatment. Requires admission for full appropriate work-up of ECT and because of concern about suicidality and severe anxiety.   TREATMENT PLAN: Admitted to psychiatry. Full work-up ordered. The patient has been counseled and educated about ECT. She will be engaged in group and individual therapy while she is on the unit. We will plan to proceed with right unilateral ECT treatment tomorrow morning. After that we will discuss possible follow up plans. For now continue current medication except to decrease the clonazepam dose in half.   DIAGNOSIS, PRINCIPAL AND PRIMARY:  AXIS I: Major depression, severe, recurrent.   SECONDARY DIAGNOSES: AXIS I: No further.  AXIS II: No diagnosis.  AXIS III: Diabetes, high blood pressure, status post ovary removal and on hormone replacement, dyslipidemia.  AXIS IV: Severe from chronic illness.  AXIS V: Functioning at time of evaluation 35.   ____________________________ Gonzella Lex, MD [id number] D: 03/22/2014 16:30:03 ET T: 03/22/2014 16:44:58 ET JOB#: [id number]  cc: Gonzella Lex, MD, <Dictator> Gonzella Lex MD ELECTRONICALLY SIGNED 03/22/2014 17:24

## 2015-04-25 ENCOUNTER — Ambulatory Visit (INDEPENDENT_AMBULATORY_CARE_PROVIDER_SITE_OTHER): Payer: Medicare Other | Admitting: Psychiatry

## 2015-04-25 DIAGNOSIS — F332 Major depressive disorder, recurrent severe without psychotic features: Secondary | ICD-10-CM | POA: Diagnosis not present

## 2015-04-25 NOTE — Patient Instructions (Signed)
Discussed orally 

## 2015-04-25 NOTE — Progress Notes (Signed)
        THERAPIST PROGRESS NOTE  Session Time:  Tuesday 04/03/2015 10:06 AM - 10:56 AM  Participation Level: Active  Behavioral Response: CasualAlert/ anxious  Type of Therapy: Individual Therapy  Treatment Goals addressed:  Increase self acceptance  Improve ability to manage stress and anxiety we decreased intensity and frequency of anxiety response ( panic attacks, avoidance) being able to go places and doing errands  Improve mood and resume normal interest in activities increasing involvement and staying out of bed.   Interventions: CBT and Supportive  Summary: [name] is a 49 y.o. female who presents with a long-standing history of chronic recurrent severe depressive symptoms accompanied by chronic anxiety. She has had multiple hospitalizations due to to suicidal attempts and depression. Her current symptoms include depressed mood, anxiety, excessive worrying, panic attacks, and passive suicidal ideations.   Patient reports increased stress since last session as she attended court regarding child support from ex-husband. She reports being very anxious in court but having support from her boyfriend and using breathing techniques to manage stress. She reports additional stress related to ex-husband sending her a letter indicating he is decreasing his visitation time with their daughter and informing patient to have no contact with him. Patient expresses frustration, sadness, and hurt regarding the letter and the effects of ex-husband's actions on her daughter. Patient states feeling guilty because of ex-husband's actions as daughter is sad about this. She has difficulty setting boundaries with daughter as a result of the guilt. She has talked with daughter's school counselor regarding situation with dad and requested support from counselor for daughter. Patient reports being very depressed and suffering from a migraine for about 4 days. She stated still wanting to stay in bed but did  get up Saturday and take her daughter to a luncheon. She reports she and her mother have reconciled.   .Suicidal/Homicidal: No  Therapist Response: Therapist works with patient to ventilate and validate feelings, discuss and praise use of distress tolerance skills, praise patient's use of assertiveness skills, identify values  Plan: Patient agrees to return for an appointment in 2 weeks. Patient agrees to resume use of plan your day handouts and to practice diaphragmatic breathing daily.   Diagnosis: Axis I: MDD, Recurrent    Axis II: Borderline Personality Dis.    Melodi Happel, LCSW

## 2015-05-03 ENCOUNTER — Ambulatory Visit (INDEPENDENT_AMBULATORY_CARE_PROVIDER_SITE_OTHER): Payer: Medicare Other | Admitting: Psychiatry

## 2015-05-03 ENCOUNTER — Encounter (HOSPITAL_COMMUNITY): Payer: Self-pay | Admitting: Psychiatry

## 2015-05-03 VITALS — BP 126/75 | HR 98 | Ht 65.0 in | Wt 212.4 lb

## 2015-05-03 DIAGNOSIS — F332 Major depressive disorder, recurrent severe without psychotic features: Secondary | ICD-10-CM

## 2015-05-03 DIAGNOSIS — F329 Major depressive disorder, single episode, unspecified: Secondary | ICD-10-CM

## 2015-05-03 MED ORDER — ARIPIPRAZOLE 2 MG PO TABS: 2.0000 mg | ORAL_TABLET | Freq: Every day | ORAL | Status: DC

## 2015-05-03 MED ORDER — CLONAZEPAM 0.5 MG PO TABS: 0.5000 mg | ORAL_TABLET | Freq: Three times a day (TID) | ORAL | Status: DC

## 2015-05-03 MED ORDER — VENLAFAXINE HCL ER 150 MG PO CP24: 150.0000 mg | ORAL_CAPSULE | Freq: Every day | ORAL | Status: DC

## 2015-05-03 MED ORDER — DULOXETINE HCL 60 MG PO CPEP: 60.0000 mg | ORAL_CAPSULE | Freq: Every day | ORAL | Status: DC
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
## 2015-05-08 ENCOUNTER — Ambulatory Visit (HOSPITAL_COMMUNITY): Payer: Self-pay | Admitting: Psychiatry

## 2015-05-15 ENCOUNTER — Ambulatory Visit (INDEPENDENT_AMBULATORY_CARE_PROVIDER_SITE_OTHER): Payer: Medicare Other | Admitting: Psychiatry

## 2015-05-15 DIAGNOSIS — F332 Major depressive disorder, recurrent severe without psychotic features: Secondary | ICD-10-CM
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
## 2015-05-15 NOTE — Patient Instructions (Signed)
Discussed orally 

## 2015-05-17 DIAGNOSIS — E119 Type 2 diabetes mellitus without complications: Secondary | ICD-10-CM | POA: Diagnosis not present

## 2015-05-17 DIAGNOSIS — E782 Mixed hyperlipidemia: Secondary | ICD-10-CM | POA: Diagnosis not present

## 2015-05-17 DIAGNOSIS — I10 Essential (primary) hypertension: Secondary | ICD-10-CM | POA: Diagnosis not present

## 2015-05-22 ENCOUNTER — Telehealth (HOSPITAL_COMMUNITY): Payer: Self-pay | Admitting: *Deleted

## 2015-05-22 DIAGNOSIS — E119 Type 2 diabetes mellitus without complications: Secondary | ICD-10-CM | POA: Diagnosis not present

## 2015-05-22 DIAGNOSIS — R945 Abnormal results of liver function studies: Secondary | ICD-10-CM | POA: Diagnosis not present

## 2015-05-22 DIAGNOSIS — I10 Essential (primary) hypertension: Secondary | ICD-10-CM | POA: Diagnosis not present

## 2015-05-22 DIAGNOSIS — E782 Mixed hyperlipidemia: Secondary | ICD-10-CM | POA: Diagnosis not present

## 2015-05-22 NOTE — Telephone Encounter (Signed)
Pt called stating that he PCP Dr. Nevada Crane things that her Cymbalta is increasing her Liver enzyme. Per pt, Dr. Nevada Crane is suppose to call Dr. Harrington Challenger to discuss this situation and what to do. Per pt she is just wondering if Dr. Harrington Challenger is ok with her stopping the Cymbalta. Pt number is [phone].

## 2015-05-23 NOTE — Telephone Encounter (Signed)
Called Dr. Harrington Challenger office and spoke with [facility] and she stated that due to Dr. Nevada Crane

## 2015-05-23 NOTE — Telephone Encounter (Signed)
Spoke with Aibel from Dr. Nevada Crane office and per their notes, Dr. Nevada Crane did not want to take pt off her medications. Dr. Nevada Crane just wanted to discuss with Dr. Harrington Challenger what they could do to help improve pt Liver Enzyme due to recent lab work. Asked Aibel to fax over a copy of pt labs and she stated she will.

## 2015-05-23 NOTE — Telephone Encounter (Signed)
Tell her not to stop it until I talk to him. Also call his office and have her labs sent to me

## 2015-05-26 NOTE — Telephone Encounter (Signed)
Spoke to Dr. Nevada Crane. She is doing so well on the current meds that we agreed to keep a close eye on her LFTs but continue meds for now

## 2015-05-26 NOTE — Telephone Encounter (Signed)
noted 

## 2015-06-08 ENCOUNTER — Ambulatory Visit (INDEPENDENT_AMBULATORY_CARE_PROVIDER_SITE_OTHER): Payer: Medicare Other | Admitting: Psychiatry

## 2015-06-08 DIAGNOSIS — F332 Major depressive disorder, recurrent severe without psychotic features: Secondary | ICD-10-CM

## 2015-06-08 NOTE — Progress Notes (Signed)
          THERAPIST PROGRESS NOTE  Session Time:  Thursday 06/08/2015 11:10 AM - 11:55 AM  Participation Level: Active  Behavioral Response: CasualAlert/ anxious  Type of Therapy: Individual Therapy   Treatment Goals addressed:   Identify replace thoughts that support depression       Increase self acceptance decreasing the negative statements about self and increasing positive statements about self       Improve ability to manage stress and anxiety with decreased intensity and frequency as of anxiety response (panic attacks, avoidance)       Improve interpersonal skills   Interventions: CBT and Supportive  Summary: [name] is a 49 y.o. female who presents with a long-standing history of chronic recurrent severe depressive symptoms accompanied by chronic anxiety. She has had multiple hospitalizations due to to suicidal attempts and depression. Her current symptoms include depressed mood, anxiety, excessive worrying, panic attacks, and passive suicidal ideations.   Patient reports increased stress since last session. She is experiencing increased health issues related to elevated glucose levels and is working with her doctor. She also has reduced sugar and caffeine intake and reports increased irritability. She also reports poor sleep pattern waking up after a few hours of sleep. She admits decreased efforts regarding self-care. She also reports increased stress related to relationship with daughter as they have been arguing and daughter does not comply with rules. Patient reports becoming angry, yelling, and damaging her coffee table. She also is worried about parenting daughter during the summer as she will be out of school. She also reports daughter has been more moody. Patient has made appointment for daughter with [facility]. Patient also reports missing having time for self on Friday nights as her daughter's father no longer keeps daughter on Fridays. Patient continues to have  support from boyfriend.  Suicidal/Homicidal: No  Therapist Response: Therapist works with patient to ventilate  feelings, praise use of initiative to schedule daughter an appointment, identify ways to use support system, identify ways to improve self-care, identify relaxation techniques, identify triggers/signs of anger, identify healthy ways to intervene (relaxation breathing, calming self-talk, counting)  Plan: Patient agrees to return for an appointment in 2 weeks. Patient will continue to use daily planner and use relaxation techniques.    Diagnosis: Axis I: MDD, Recurrent    Axis II: Borderline Personality Dis.    Sudais Banghart, LCSW

## 2015-06-12 DIAGNOSIS — R945 Abnormal results of liver function studies: Secondary | ICD-10-CM | POA: Diagnosis not present

## 2015-06-12 DIAGNOSIS — E119 Type 2 diabetes mellitus without complications: Secondary | ICD-10-CM | POA: Diagnosis not present

## 2015-06-14 ENCOUNTER — Encounter (HOSPITAL_COMMUNITY): Payer: Self-pay | Admitting: Psychiatry

## 2015-06-14 ENCOUNTER — Ambulatory Visit (INDEPENDENT_AMBULATORY_CARE_PROVIDER_SITE_OTHER): Payer: Medicare Other | Admitting: Psychiatry

## 2015-06-14 ENCOUNTER — Telehealth (HOSPITAL_COMMUNITY): Payer: Self-pay | Admitting: *Deleted

## 2015-06-14 VITALS — BP 138/74 | HR 89 | Ht 65.0 in | Wt 211.5 lb

## 2015-06-14 DIAGNOSIS — F329 Major depressive disorder, single episode, unspecified: Secondary | ICD-10-CM | POA: Diagnosis not present

## 2015-06-14 DIAGNOSIS — F332 Major depressive disorder, recurrent severe without psychotic features: Secondary | ICD-10-CM

## 2015-06-14 MED ORDER — ARIPIPRAZOLE 2 MG PO TABS: 2.0000 mg | ORAL_TABLET | Freq: Every day | ORAL | Status: DC

## 2015-06-14 MED ORDER — CLONAZEPAM 0.5 MG PO TABS: 0.5000 mg | ORAL_TABLET | Freq: Three times a day (TID) | ORAL | Status: DC

## 2015-06-14 MED ORDER — VENLAFAXINE HCL ER 150 MG PO CP24: 150.0000 mg | ORAL_CAPSULE | Freq: Every day | ORAL | Status: DC

## 2015-06-14 NOTE — Telephone Encounter (Signed)
Per pt, she would like for Dr. Harrington Challenger to call her back with her labs results. Pt number is [phone].
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
## 2015-06-14 NOTE — Telephone Encounter (Signed)
lmtcb

## 2015-06-14 NOTE — Telephone Encounter (Signed)
The labs were done by Dr. Nevada Crane, she needs to call him for results and interpretation

## 2015-06-15 NOTE — Telephone Encounter (Signed)
Spoke with pt and she showed understanding 

## 2015-06-20 ENCOUNTER — Telehealth (HOSPITAL_COMMUNITY): Payer: Self-pay | Admitting: *Deleted

## 2015-06-20 NOTE — Telephone Encounter (Signed)
PATIENT CANCELED DUE TO HER SISTER IS IN CRITICAL CONDITION.

## 2015-06-22 ENCOUNTER — Ambulatory Visit (HOSPITAL_COMMUNITY): Payer: Self-pay | Admitting: Psychiatry

## 2015-06-29 ENCOUNTER — Ambulatory Visit (HOSPITAL_COMMUNITY): Payer: Self-pay | Admitting: Psychiatry

## 2015-07-11 ENCOUNTER — Other Ambulatory Visit: Payer: Self-pay | Admitting: Obstetrics & Gynecology

## 2015-07-11 DIAGNOSIS — Z1231 Encounter for screening mammogram for malignant neoplasm of breast: Secondary | ICD-10-CM

## 2015-07-13 ENCOUNTER — Encounter (HOSPITAL_COMMUNITY): Payer: Self-pay | Admitting: Psychiatry

## 2015-07-13 ENCOUNTER — Ambulatory Visit (INDEPENDENT_AMBULATORY_CARE_PROVIDER_SITE_OTHER): Payer: Medicare Other | Admitting: Psychiatry

## 2015-07-13 VITALS — BP 149/83 | HR 101 | Wt 211.6 lb

## 2015-07-13 DIAGNOSIS — F332 Major depressive disorder, recurrent severe without psychotic features: Secondary | ICD-10-CM | POA: Diagnosis not present

## 2015-07-13 MED ORDER — ARIPIPRAZOLE 2 MG PO TABS: 2.0000 mg | ORAL_TABLET | Freq: Every day | ORAL | Status: DC

## 2015-07-13 MED ORDER — CLONAZEPAM 0.5 MG PO TABS: 0.5000 mg | ORAL_TABLET | Freq: Three times a day (TID) | ORAL | Status: DC

## 2015-07-13 MED ORDER — VENLAFAXINE HCL ER 150 MG PO CP24: 150.0000 mg | ORAL_CAPSULE | Freq: Every day | ORAL | Status: DC
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
## 2015-07-14 ENCOUNTER — Ambulatory Visit (INDEPENDENT_AMBULATORY_CARE_PROVIDER_SITE_OTHER): Payer: Medicare Other | Admitting: Psychiatry

## 2015-07-14 DIAGNOSIS — F332 Major depressive disorder, recurrent severe without psychotic features: Secondary | ICD-10-CM | POA: Diagnosis not present

## 2015-07-14 NOTE — Patient Instructions (Signed)
Discussed orally 

## 2015-07-14 NOTE — Progress Notes (Signed)
          THERAPIST PROGRESS NOTE  Session Time:  Friday 07/14/2015 2:10 PM - 2:55 PM  Participation Level: Active  Behavioral Response: CasualAlert/ anxious  Type of Therapy: Individual Therapy   Treatment Goals addressed:   Identify replace thoughts that support depression       Increase self acceptance decreasing the negative statements about self and increasing positive statements about self       Improve ability to manage stress and anxiety with decreased intensity and frequency as of anxiety response (panic attacks, avoidance)       Improve interpersonal skills   Interventions: CBT and Supportive  Summary: LIBERTI APPLETON is a 49 y.o. female who presents with a long-standing history of chronic recurrent severe depressive symptoms accompanied by chronic anxiety. She has had multiple hospitalizations due to to suicidal attempts and depression. Her current symptoms include depressed mood, anxiety, excessive worrying, panic attacks, and passive suicidal ideations.   Patient reports continued stress since last session. Her sister has cirrhosis of the liver. She recently was hospitalized in the intensive care unit and was not expected to live. However, she had several procedures and now is in the [facility]. Patient reports being encouraging to sister. Patient reports she went to hospital to see sister almost daily and drove herself to [place] twice. She reports being nervous about driving but successfully using coping skills. She and her daughter are getting along much better. They have been involved in various activities and recently went to the beach with a friend. Patient reports trip went well. She experienced a period of depression while there after having a bad dream. She thinks this may have been triggered by being around family and feeling negative about self that she can't work like her siblings. She stayed in bed the majority of the next day but reports  eventually pushing herself to do things with daughter. She reports this did help her feel better. She reports glucose levels have improved.  Patient continues to have support from boyfriend.  Suicidal/Homicidal: No  Therapist Response: Therapist works with patient to ventilate  feelings, praise use of coping skills, identify ways to reframe negative thoughts i  Plan: Patient agrees to return for an appointment in 2 weeks.   Diagnosis: Axis I: MDD, Recurrent    Axis II: Borderline Personality Dis.    Takiesha Mcdevitt, LCSW

## 2015-07-24 ENCOUNTER — Ambulatory Visit (HOSPITAL_COMMUNITY): Payer: Self-pay

## 2015-07-26 ENCOUNTER — Ambulatory Visit (HOSPITAL_COMMUNITY): Payer: Self-pay | Admitting: Psychiatry

## 2015-07-28 ENCOUNTER — Ambulatory Visit (HOSPITAL_COMMUNITY): Payer: Medicare Other | Admitting: Psychiatry

## 2015-08-02 ENCOUNTER — Ambulatory Visit (HOSPITAL_COMMUNITY): Payer: Self-pay

## 2015-08-03 ENCOUNTER — Ambulatory Visit (HOSPITAL_COMMUNITY)
Admission: RE | Admit: 2015-08-03 | Discharge: 2015-08-03 | Disposition: A | Discharge status: Home / Self Care | Payer: Medicare Other | Source: Ambulatory Visit | Attending: Obstetrics & Gynecology | Admitting: Obstetrics & Gynecology

## 2015-08-03 DIAGNOSIS — Z1231 Encounter for screening mammogram for malignant neoplasm of breast: Secondary | ICD-10-CM | POA: Diagnosis not present

## 2015-08-18 ENCOUNTER — Ambulatory Visit (INDEPENDENT_AMBULATORY_CARE_PROVIDER_SITE_OTHER): Payer: Medicare Other | Admitting: Psychiatry

## 2015-08-18 ENCOUNTER — Encounter (HOSPITAL_COMMUNITY): Payer: Self-pay | Admitting: Psychiatry

## 2015-08-18 DIAGNOSIS — F332 Major depressive disorder, recurrent severe without psychotic features: Secondary | ICD-10-CM

## 2015-08-18 NOTE — Patient Instructions (Signed)
Discussed orally 

## 2015-08-18 NOTE — Progress Notes (Signed)
          THERAPIST PROGRESS NOTE  Session Time:  Friday 07/14/2015 2:10 PM - 2:55 PM  Participation Level: Active  Behavioral Response: CasualAlert//depressed, anxiou  Type of Therapy: Individual Therapy   Treatment Goals :     1 .Identify replace thoughts that support depression       2. 1ncrease self acceptance decreasing the negative statements about self and increasing positive statements about self       3. Improve ability to manage stress and anxiety with decreased intensity and frequency as of anxiety response (panic attacks, avoidance)       4. Improve interpersonal skills Treatment Goals addressed   Interventions: CBT and Supportive  Summary: [name] is a 49 y.o. female who presents with a long-standing history of chronic recurrent severe depressive symptoms accompanied by chronic anxiety. She has had multiple hospitalizations due to to suicidal attempts and depression. Her current symptoms include depressed mood, anxiety, excessive worrying, panic attacks, and passive suicidal ideations.   Patient reports increased depressed mood since last session. During the past two weeks, she has had increased thoughts about lost relationships and fears she makes wrong choices in relationships. She expresses frustration and confusion about her relationship with her boyfriend who has been critical of her parenting skills and her appearance but also has been supportive and understanding of some of her needs and concerns. Patient reports becoming overwhelmed regarding her emotions and admits having fleeting suicidal ideations last night but says she would not do anything to self because she wants to be here to take care of her daughter. Patient continues to have poor self-acceptance and is critical of self.  Suicidal/Homicidal: No current suicidal ideations. Patient agrees to call this practice, call 911, or have someone take her to the ER should symptoms worsen.  Therapist Response:  Therapist works with patient to ventilate feelings, identify thoughts that support depression, review connection between thoughts and feelings,  identify ways to challenge negative thoughts, coping statements, discuss possible resources for parenting help, identify positive ways to communicate rather than negative ways to communicate with daughter and boyfriend,  Plan: Patient agrees to return for an appointment in 2 weeks. Patient agrees to complete automatic thoughts log.    Diagnosis: Axis I: MDD, Recurrent    Axis II: Borderline Personality Dis.    Sherisse Fullilove, LCSW

## 2015-09-01 ENCOUNTER — Ambulatory Visit (HOSPITAL_COMMUNITY): Payer: Self-pay | Admitting: Psychiatry

## 2015-09-06 ENCOUNTER — Encounter (HOSPITAL_COMMUNITY): Payer: Self-pay | Admitting: Psychiatry

## 2015-09-06 ENCOUNTER — Ambulatory Visit (INDEPENDENT_AMBULATORY_CARE_PROVIDER_SITE_OTHER): Payer: Medicare Other | Admitting: Psychiatry

## 2015-09-06 DIAGNOSIS — F332 Major depressive disorder, recurrent severe without psychotic features: Secondary | ICD-10-CM | POA: Diagnosis not present

## 2015-09-06 NOTE — Progress Notes (Signed)
          THERAPIST PROGRESS NOTE  Session Time:  Wednesday 09/06/2015 2:15 PM - 3:08PM  Participation Level: Active  Behavioral Response: disheveled/depressed,  Type of Therapy: Individual Therapy   Treatment Goals :     1 .Identify replace thoughts that support depression       2. 1ncrease self acceptance decreasing the negative statements about self and increasing positive statements about self       3. Improve ability to manage stress and anxiety with decreased intensity and frequency as of anxiety response (panic attacks, avoidance)       4. Improve interpersonal skills  Treatment Goals addressed  1,2   Interventions: CBT and Supportive  Summary: [name] is a 49 y.o. female who presents with a long-standing history of chronic recurrent severe depressive symptoms accompanied by chronic anxiety. She has had multiple hospitalizations due to to suicidal attempts and depression. Her current symptoms include depressed mood, anxiety, excessive worrying, panic attacks, and passive suicidal ideations.   Patient reports continued depressed mood since last session. She reports multiple stressors including concerns about her mother's and sister's health. Mother has pacemaker and sister was recently hospitalized. Patient reports fearing losing them and being nervous every time the phone rings. She also reports stress related to spending too much money buying clothes for daughter in preparing her to resume school. She reports stress regarding relationship with boyfriend. She reports poor motivation, loss of interest in activities, low energy, decreased efforts regarding self-care.  Suicidal/Homicidal: Patient reports having fleeting suicidal ideations last week but reports no current suicidal ideations. Patient agrees to call this practice, call 911, or have someone take her to the ER should symptoms worsen.  Therapist Response: Therapist works with patient to ventilate feelings, provide  psychoeducation regarding depression and effects on functioning, identify thoughts and events that trigger depression, identify ways to improve self-care and ways to increase involvement in activity,identify realistic expectations of self,  identify patient's values to facilitate patient pursuing valued based goals to cope with depression   Plan: Patient agrees to return for an appointment in 2 weeks. Patient agrees to use daily planning to increase involvement in activity. Patient is scheduled to see psychiatrist Dr. Harrington Challenger next week for medication evaluation.   Diagnosis: Axis I: MDD, Recurrent    Axis II: Borderline Personality Dis.    BYNUM,PEGGY, LCSW

## 2015-09-06 NOTE — Patient Instructions (Signed)
Discussed orally 

## 2015-09-12 ENCOUNTER — Encounter (HOSPITAL_COMMUNITY): Payer: Self-pay | Admitting: Psychiatry

## 2015-09-12 ENCOUNTER — Ambulatory Visit (INDEPENDENT_AMBULATORY_CARE_PROVIDER_SITE_OTHER): Payer: Medicare Other | Admitting: Psychiatry

## 2015-09-12 VITALS — BP 138/82 | HR 82 | Ht 65.0 in | Wt 210.8 lb

## 2015-09-12 DIAGNOSIS — F329 Major depressive disorder, single episode, unspecified: Secondary | ICD-10-CM

## 2015-09-12 DIAGNOSIS — F332 Major depressive disorder, recurrent severe without psychotic features: Secondary | ICD-10-CM

## 2015-09-12 MED ORDER — CLONAZEPAM 0.5 MG PO TABS: 0.5000 mg | ORAL_TABLET | Freq: Three times a day (TID) | ORAL | Status: DC

## 2015-09-12 MED ORDER — ARIPIPRAZOLE 2 MG PO TABS: 2.0000 mg | ORAL_TABLET | Freq: Every day | ORAL | Status: DC

## 2015-09-12 MED ORDER — VENLAFAXINE HCL ER 150 MG PO CP24: 150.0000 mg | ORAL_CAPSULE | Freq: Every day | ORAL | Status: DC
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
## 2015-09-13 ENCOUNTER — Ambulatory Visit (HOSPITAL_COMMUNITY): Payer: Self-pay | Admitting: Psychiatry

## 2015-09-13 DIAGNOSIS — H5213 Myopia, bilateral: Secondary | ICD-10-CM | POA: Diagnosis not present

## 2015-09-13 DIAGNOSIS — H52221 Regular astigmatism, right eye: Secondary | ICD-10-CM | POA: Diagnosis not present

## 2015-09-13 DIAGNOSIS — E119 Type 2 diabetes mellitus without complications: Secondary | ICD-10-CM | POA: Diagnosis not present

## 2015-09-13 DIAGNOSIS — H524 Presbyopia: Secondary | ICD-10-CM | POA: Diagnosis not present

## 2015-09-15 ENCOUNTER — Ambulatory Visit (HOSPITAL_COMMUNITY): Payer: Self-pay | Admitting: Psychiatry

## 2015-09-16 ENCOUNTER — Other Ambulatory Visit: Payer: Self-pay | Admitting: Obstetrics & Gynecology

## 2015-09-18 DIAGNOSIS — I10 Essential (primary) hypertension: Secondary | ICD-10-CM | POA: Diagnosis not present

## 2015-09-18 DIAGNOSIS — E119 Type 2 diabetes mellitus without complications: Secondary | ICD-10-CM | POA: Diagnosis not present

## 2015-09-18 DIAGNOSIS — E782 Mixed hyperlipidemia: Secondary | ICD-10-CM | POA: Diagnosis not present

## 2015-09-20 ENCOUNTER — Encounter (HOSPITAL_COMMUNITY): Payer: Self-pay | Admitting: Psychiatry

## 2015-09-20 ENCOUNTER — Ambulatory Visit (INDEPENDENT_AMBULATORY_CARE_PROVIDER_SITE_OTHER): Payer: Medicare Other | Admitting: Psychiatry

## 2015-09-20 DIAGNOSIS — E782 Mixed hyperlipidemia: Secondary | ICD-10-CM | POA: Diagnosis not present

## 2015-09-20 DIAGNOSIS — Z23 Encounter for immunization: Secondary | ICD-10-CM | POA: Diagnosis not present

## 2015-09-20 DIAGNOSIS — F332 Major depressive disorder, recurrent severe without psychotic features: Secondary | ICD-10-CM | POA: Diagnosis not present

## 2015-09-20 DIAGNOSIS — E119 Type 2 diabetes mellitus without complications: Secondary | ICD-10-CM | POA: Diagnosis not present

## 2015-09-20 DIAGNOSIS — I10 Essential (primary) hypertension: Secondary | ICD-10-CM | POA: Diagnosis not present

## 2015-09-20 NOTE — Progress Notes (Signed)
           THERAPIST PROGRESS NOTE  Session Time:  Wednesday 09/20/2015 10:10 AM - 11:00 AM  Participation Level: Active  Behavioral Response: disheveled/depressed,  Type of Therapy: Individual Therapy   Treatment Goals :     1 .Identify replace thoughts that support depression       2. 1ncrease self acceptance decreasing the negative statements about self and increasing positive statements about self       3. Improve ability to manage stress and anxiety with decreased intensity and frequency as of anxiety response (panic attacks, avoidance)       4. Improve interpersonal skills  Treatment Goals addressed  1,3, 4   Interventions: CBT and Supportive  Summary: [name] is a 49 y.o. female who presents with a long-standing history of chronic recurrent severe depressive symptoms accompanied by chronic anxiety. She has had multiple hospitalizations due to to suicidal attempts and depression. Her current symptoms include depressed mood, anxiety, excessive worrying, panic attacks, and passive suicidal ideations.   Patient reports continued depressed mood since last session. She cites several stressors including 56 year old aunt dying and conflict with her boyfriend. She expresses frustration boyfriend does not want to have a more serious commitment to their relationship and will not introduce her to his family. She is trying to make more efforts to engage in activity.   Suicidal/Homicidal: Patient reports having fleeting suicidal ideations last week but reports no current suicidal ideations. Patient agrees to call this practice, call 911, or have someone take her to the ER should symptoms worsen.  Therapist Response: Therapist works with patient to ventilate feelings, do cost/benefit analysis regarding relationship with boyfriend, encourage patient to increase involvement in activity, discuss goal setting in small steps regarding activity, encourage patient to resume use of feelings  log, review coping and relaxation techniques.  Plan: Patient agrees to return for an appointment in 2 weeks. Patient agrees to use daily planning to increase involvement in activity, resume use of feelings log   Diagnosis: Axis I: MDD, Recurrent    Axis II: Borderline Personality Dis.    BYNUM,PEGGY, LCSW

## 2015-10-05 ENCOUNTER — Ambulatory Visit (HOSPITAL_COMMUNITY): Payer: Self-pay | Admitting: Psychiatry

## 2015-10-11 ENCOUNTER — Ambulatory Visit (INDEPENDENT_AMBULATORY_CARE_PROVIDER_SITE_OTHER): Payer: Medicare Other | Admitting: Psychiatry

## 2015-10-11 ENCOUNTER — Encounter (HOSPITAL_COMMUNITY): Payer: Self-pay | Admitting: Psychiatry

## 2015-10-11 VITALS — BP 150/79 | HR 96 | Ht 65.0 in | Wt 209.0 lb

## 2015-10-11 DIAGNOSIS — F332 Major depressive disorder, recurrent severe without psychotic features: Secondary | ICD-10-CM

## 2015-10-11 MED ORDER — ARIPIPRAZOLE 2 MG PO TABS: 2.0000 mg | ORAL_TABLET | Freq: Every day | ORAL | Status: DC

## 2015-10-11 MED ORDER — CLONAZEPAM 0.5 MG PO TABS: 0.5000 mg | ORAL_TABLET | Freq: Three times a day (TID) | ORAL | Status: DC

## 2015-10-11 MED ORDER — VENLAFAXINE HCL ER 150 MG PO CP24: 150.0000 mg | ORAL_CAPSULE | Freq: Every day | ORAL | Status: DC
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
## 2015-10-19 ENCOUNTER — Ambulatory Visit (HOSPITAL_COMMUNITY): Payer: Self-pay | Admitting: Psychiatry

## 2015-10-19 ENCOUNTER — Encounter (HOSPITAL_COMMUNITY): Payer: Self-pay | Admitting: Psychiatry

## 2015-10-19 ENCOUNTER — Ambulatory Visit (INDEPENDENT_AMBULATORY_CARE_PROVIDER_SITE_OTHER): Payer: Medicare Other | Admitting: Psychiatry

## 2015-10-19 DIAGNOSIS — F332 Major depressive disorder, recurrent severe without psychotic features: Secondary | ICD-10-CM | POA: Diagnosis not present

## 2015-10-19 NOTE — Progress Notes (Signed)
           THERAPIST PROGRESS NOTE  Session Time:  Thursday 10/19/2015 9:10 AM -  9:58 AM   Participation Level: Active  Behavioral Response: disheveled/depressed,  Type of Therapy: Individual Therapy   Treatment Goals :     1 .Identify replace thoughts that support depression       2. 1ncrease self acceptance decreasing the negative statements about self and increasing positive statements about self       3. Improve ability to manage stress and anxiety with decreased intensity and frequency as of anxiety response (panic attacks, avoidance)       4. Improve interpersonal skills  Treatment Goals addressed  1,3,   Interventions: CBT and Supportive  Summary: [name] is a 49 y.o. female who presents with a long-standing history of chronic recurrent severe depressive symptoms accompanied by chronic anxiety. She has had multiple hospitalizations due to to suicidal attempts and depression. Her current symptoms include depressed mood, anxiety, excessive worrying, panic attacks, and passive suicidal ideations.   Patient reports improved mood and decreased anxiety since last session until yesterday. She reports having a panic attack while watching a movie with a friend. She then received call from mother who was in distress and asked patient to come to her home. Mother has several health issues. Patient reports still being very anxious at the time but going to mother's home and eventually going with sister to take mother to the ER. Patient reports using focused breathing, distracting activities, and self-talk as well as support from family and friends to cope. However, she s expresses frustration and fear about having panic attack. She also reports fear of having a heart attack as there is history of heart disease in her family.    Suicidal/Homicidal: No  Therapist Response: Therapist works with patient to review symptoms, ventilate feelings, identify coping techniques used and  benefits, identify thought patterns about having panic attack/heart attack and ways to reframe, identify statements to facilitate acceptance of condition,  Review chemistry of anxiety and effects on body, discuss rationale for focused breathing and practicing regularly, review coping and relaxation techniques.  Plan: Patient agrees to return for an appointment in 2 weeks.   Diagnosis: Axis I: MDD, Recurrent    Axis II: Borderline Personality Dis.    Larina Lieurance, LCSW

## 2015-10-19 NOTE — Addendum Note (Signed)
Addended by: Maurice Small E on: 10/19/2015 10:10 AM   Modules accepted: Level of Service

## 2015-10-19 NOTE — Patient Instructions (Signed)
Discussed orally 

## 2015-11-06 ENCOUNTER — Ambulatory Visit (HOSPITAL_COMMUNITY): Payer: Self-pay | Admitting: Psychiatry

## 2015-11-16 ENCOUNTER — Encounter (HOSPITAL_COMMUNITY): Payer: Self-pay | Admitting: Psychiatry

## 2015-11-17 ENCOUNTER — Telehealth (HOSPITAL_COMMUNITY): Payer: Self-pay | Admitting: *Deleted

## 2015-11-20 ENCOUNTER — Ambulatory Visit (INDEPENDENT_AMBULATORY_CARE_PROVIDER_SITE_OTHER): Payer: Medicare Other | Admitting: Psychiatry

## 2015-11-20 ENCOUNTER — Encounter (HOSPITAL_COMMUNITY): Payer: Self-pay | Admitting: Psychiatry

## 2015-11-20 DIAGNOSIS — F332 Major depressive disorder, recurrent severe without psychotic features: Secondary | ICD-10-CM | POA: Diagnosis not present

## 2015-11-20 NOTE — Patient Instructions (Signed)
Discussed orally 

## 2015-11-20 NOTE — Progress Notes (Signed)
            THERAPIST PROGRESS NOTE  Session Time:  Monday 11/20/2015 9:10 AM - 9:58 AM       Participation Level: Active  Behavioral Response: anxious  Type of Therapy: Individual Therapy   Treatment Goals :     1 .Identify replace thoughts that support depression       2. 1ncrease self acceptance decreasing the negative statements about self and increasing positive statements about self       3. Improve ability to manage stress and anxiety with decreased intensity and frequency as of anxiety response (panic attacks, avoidance)       4. Improve interpersonal skills  Treatment Goals addressed  2,3,4   Interventions: CBT and Supportive  Summary: [name] is a 49 y.o. female who presents with a long-standing history of chronic recurrent severe depressive symptoms accompanied by chronic anxiety. She has had multiple hospitalizations due to to suicidal attempts and depression. Her current symptoms include depressed mood, anxiety, excessive worrying, panic attacks, and passive suicidal ideations.   Patient reports increased anxiety since last session as her mother's health has continued to decline. Mother now requires 24 hour supervision at home. Patient and her sisters sharing this responsibility and patient stays with mother 2 nights per week. This has disrupted patient's sleep routine and sense of normalcy. She has experienced increased fatigue and has become more critical of self as she has not felt like performing household chores and other responsibilities. She has maintained social involvement with her boyfriend who has been supportive in ways but also has been critical of patient. She was able to express her feelings to him about recent negative comments but reports feeling bad about herself due to his comments. Patient has been using breathing techniques and mindfulness activities to try to manage anxiety.   Suicidal/Homicidal: No  Therapist Response: Therapist works with  patient to review symptoms, ventilate feelings, praise use of  coping techniques, encourage patient to resume exercise to manage stress, identify realistic expectations of self, identify ways to improve assertiveness skills in relationship with boyfriend,  Plan: Patient agrees to return for an appointment in 2 weeks.   Diagnosis: Axis I: MDD, Recurrent    Axis II: Borderline Personality Dis.    Haruye Lainez, LCSW

## 2015-12-06 ENCOUNTER — Ambulatory Visit (HOSPITAL_COMMUNITY): Payer: Self-pay | Admitting: Psychiatry

## 2015-12-11 ENCOUNTER — Ambulatory Visit (HOSPITAL_COMMUNITY): Payer: Self-pay | Admitting: Psychiatry

## 2015-12-20 ENCOUNTER — Encounter (HOSPITAL_COMMUNITY): Payer: Self-pay | Admitting: Psychiatry

## 2015-12-20 ENCOUNTER — Ambulatory Visit (INDEPENDENT_AMBULATORY_CARE_PROVIDER_SITE_OTHER): Payer: Medicare Other | Admitting: Psychiatry

## 2015-12-20 DIAGNOSIS — F332 Major depressive disorder, recurrent severe without psychotic features: Secondary | ICD-10-CM

## 2015-12-20 NOTE — Progress Notes (Signed)
             THERAPIST PROGRESS NOTE  Session Time:  Wednesday 12/20/2015  9:15 AM -  9:59 AM           Participation Level: Active  Behavioral Response: euthymic  Type of Therapy: Individual Therapy   Treatment Goals :     1 .Identify replace thoughts that support depression       2. 1ncrease self acceptance decreasing the negative statements about self and increasing positive statements about self       3. Improve ability to manage stress and anxiety with decreased intensity and frequency as of anxiety response (panic attacks, avoidance)       4. Improve interpersonal skills  Treatment Goals addressed  1, 2,3,4   Interventions: CBT and Supportive,ACT  Summary: [name] is a 49 y.o. female who presents with a long-standing history of chronic recurrent severe depressive symptoms accompanied by chronic anxiety. She has had multiple hospitalizations due to to suicidal attempts and depression. Her current symptoms include depressed mood, anxiety, excessive worrying, panic attacks, and passive suicidal ideations.   Patient reports decreased anxiety, improved mood, and decreased stress since last session. She reports having days where she is depressed but successfully using activities and coping statements to manage. She is pleased mother's health has improved and mother now is able to stay at home alone at night. Patient also is relieved her daughter's behavior has improved and they now have improved interaction. Patient's boyfriend has been more supportive and less critical since last session. She is looking forward to celebrating the holidays with family. She reports she is experiencing sadness regarding deceased father and deceased sister. Patient is pleased with ways she is managing her condition but sometimes fears she may crash again.  Suicidal/Homicidal: No  Therapist Response: Therapist works with patient to review symptoms, praise and reinforce use of coping skills,  normalize feelings of sadness regarding deceased loved ones, discuss lapse versus relapse  Plan: Patient agrees to return for an appointment in 2 weeks.   Diagnosis: Axis I: MDD, Recurrent    Axis II: Borderline Personality Dis.    Jerrold Haskell, LCSW

## 2015-12-20 NOTE — Patient Instructions (Signed)
Discussed orally 

## 2015-12-22 ENCOUNTER — Other Ambulatory Visit: Payer: Self-pay | Admitting: Obstetrics & Gynecology

## 2015-12-29 ENCOUNTER — Ambulatory Visit (INDEPENDENT_AMBULATORY_CARE_PROVIDER_SITE_OTHER): Payer: Medicare Other | Admitting: Psychiatry

## 2015-12-29 ENCOUNTER — Encounter (HOSPITAL_COMMUNITY): Payer: Self-pay | Admitting: Psychiatry

## 2015-12-29 VITALS — BP 145/76 | HR 92 | Ht 65.0 in | Wt 212.2 lb

## 2015-12-29 DIAGNOSIS — F332 Major depressive disorder, recurrent severe without psychotic features: Secondary | ICD-10-CM

## 2015-12-29 MED ORDER — VENLAFAXINE HCL ER 150 MG PO CP24: 150.0000 mg | ORAL_CAPSULE | Freq: Every day | ORAL | Status: DC

## 2015-12-29 MED ORDER — ARIPIPRAZOLE 2 MG PO TABS: 2.0000 mg | ORAL_TABLET | Freq: Every day | ORAL | Status: DC

## 2015-12-29 MED ORDER — CLONAZEPAM 0.5 MG PO TABS: 0.5000 mg | ORAL_TABLET | Freq: Three times a day (TID) | ORAL | Status: DC
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
## 2016-01-03 ENCOUNTER — Encounter (HOSPITAL_COMMUNITY): Payer: Self-pay | Admitting: Psychiatry

## 2016-01-03 ENCOUNTER — Ambulatory Visit (INDEPENDENT_AMBULATORY_CARE_PROVIDER_SITE_OTHER): Payer: Medicare Other | Admitting: Psychiatry

## 2016-01-03 DIAGNOSIS — F332 Major depressive disorder, recurrent severe without psychotic features: Secondary | ICD-10-CM

## 2016-01-03 NOTE — Progress Notes (Signed)
             THERAPIST PROGRESS NOTE  Session Time:  Wednesday  01/03/2016 9:15 AM -  9 :58 AM              Participation Level: Active  Behavioral Response: euthymic  Type of Therapy: Individual Therapy   Treatment Goals :     1 .Identify replace thoughts that support depression       2. 1ncrease self acceptance decreasing the negative statements about self and increasing positive statements about self       3. Improve ability to manage stress and anxiety with decreased intensity and frequency as of anxiety response (panic attacks, avoidance)       4. Improve interpersonal skills  Treatment Goals addressed  1, 2,3,   Interventions: CBT and Supportive,ACT  Summary: [name] is a 50 y.o. female who presents with a long-standing history of chronic recurrent severe depressive symptoms accompanied by chronic anxiety. She has had multiple hospitalizations due to to suicidal attempts and depression. Her current symptoms include depressed mood, anxiety, excessive worrying, panic attacks, and passive suicidal ideations.   Patient reports continued decreased anxiety, improved mood, and decreased stress since last session until last night. She reports having a dream about her trauma history. This morning, she is depressed and has negative thought patterns about self and others. She denies any suicidal ideations. She expresses frustration and says family members are not supportive and don't help her like they help others.  Prior to last night, patient reports being more active and attending a yoga class.  Suicidal/Homicidal: No  Therapist Response: Therapist works with patient to review symptoms, discuss lapse versus relapse, discuss effects of trauma history on thought pattens, identify ways to nurture inner child, identify and challenge cognitive distortions, discuss activities patient can pursue to cope with depression.   Plan: Patient agrees to return for an appointment in 3  weeks.   Diagnosis: Axis I: MDD, Recurrent    Axis II: Borderline Personality Dis.    Idy Rawling, LCSW

## 2016-01-03 NOTE — Patient Instructions (Signed)
Discussed orally 

## 2016-01-17 DIAGNOSIS — I10 Essential (primary) hypertension: Secondary | ICD-10-CM | POA: Diagnosis not present

## 2016-01-17 DIAGNOSIS — E119 Type 2 diabetes mellitus without complications: Secondary | ICD-10-CM | POA: Diagnosis not present

## 2016-01-24 ENCOUNTER — Encounter (HOSPITAL_COMMUNITY): Payer: Self-pay | Admitting: Psychiatry

## 2016-01-24 ENCOUNTER — Ambulatory Visit (INDEPENDENT_AMBULATORY_CARE_PROVIDER_SITE_OTHER): Payer: Medicare Other | Admitting: Psychiatry

## 2016-01-24 DIAGNOSIS — F332 Major depressive disorder, recurrent severe without psychotic features: Secondary | ICD-10-CM

## 2016-01-24 DIAGNOSIS — E782 Mixed hyperlipidemia: Secondary | ICD-10-CM | POA: Diagnosis not present

## 2016-01-24 DIAGNOSIS — B070 Plantar wart: Secondary | ICD-10-CM | POA: Diagnosis not present

## 2016-01-24 DIAGNOSIS — I10 Essential (primary) hypertension: Secondary | ICD-10-CM | POA: Diagnosis not present

## 2016-01-24 DIAGNOSIS — F319 Bipolar disorder, unspecified: Secondary | ICD-10-CM | POA: Diagnosis not present

## 2016-01-24 DIAGNOSIS — R945 Abnormal results of liver function studies: Secondary | ICD-10-CM | POA: Diagnosis not present

## 2016-01-24 DIAGNOSIS — E119 Type 2 diabetes mellitus without complications: Secondary | ICD-10-CM | POA: Diagnosis not present

## 2016-01-24 DIAGNOSIS — G43001 Migraine without aura, not intractable, with status migrainosus: Secondary | ICD-10-CM | POA: Diagnosis not present

## 2016-01-24 NOTE — Progress Notes (Signed)
             THERAPIST PROGRESS NOTE  Session Time:  Wednesday  01/24/2016 9:10 AM -  10:00 AM                     Participation Level: Active  Behavioral Response: euthymic  Type of Therapy: Individual Therapy   Treatment Goals :     1 .Identify replace thoughts that support depression       2. 1ncrease self acceptance decreasing the negative statements about self and increasing positive statements about self       3. Improve ability to manage stress and anxiety with decreased intensity and frequency as of anxiety response (panic attacks, avoidance)       4. Improve interpersonal skills  Treatment Goals addressed  1, 2,,   Interventions: CBT and Supportive,ACT  Summary: [name] is a 50 y.o. female who presents with a long-standing history of chronic recurrent severe depressive symptoms accompanied by chronic anxiety. She has had multiple hospitalizations due to to suicidal attempts and depression. Her current symptoms include depressed mood, anxiety, excessive worrying, panic attacks, and passive suicidal ideations.   Patient reports continued increased depressed  mood and decreased interest in activity since last session. She reports poor sleep pattern due to dreams with recurrent theme of not being good enough. She  expresses increased frustration with boyfriend as he will not commit and will not tell his family about patient. Patient admits she has had  passive suicidal ideations but says she wouldn't do anything due to concerns about daughter.  Suicidal/Homicidal: No. Patient agrees to call this practice,call 911, or have someone take her to the ER should symptoms worsen.  Therapist Response: Therapist works with patient to review symptoms, process feelings about relationship with boyfriend and effects on patient, identify ways to improve self-care and maintain consistency, identify coping statements  .Plan: Patient agrees to return for an appointment in 1-2  weeks.   Diagnosis: Axis I: MDD, Recurrent    Axis II: Borderline Personality Dis.    Chavela Justiniano, LCSW

## 2016-01-24 NOTE — Patient Instructions (Signed)
Discussed orally 

## 2016-02-07 ENCOUNTER — Ambulatory Visit (HOSPITAL_COMMUNITY): Payer: Self-pay | Admitting: Psychiatry

## 2016-02-07 ENCOUNTER — Encounter (HOSPITAL_COMMUNITY): Payer: Self-pay | Admitting: Psychiatry

## 2016-02-08 ENCOUNTER — Emergency Department (HOSPITAL_COMMUNITY)
Admission: EM | Admit: 2016-02-08 | Discharge: 2016-02-09 | Disposition: A | Discharge status: Other Acute Inpatient Hospital | Payer: Medicare Other | Attending: Emergency Medicine | Admitting: Emergency Medicine

## 2016-02-08 ENCOUNTER — Ambulatory Visit (INDEPENDENT_AMBULATORY_CARE_PROVIDER_SITE_OTHER): Payer: Medicare Other | Admitting: Psychiatry

## 2016-02-08 ENCOUNTER — Telehealth (HOSPITAL_COMMUNITY): Payer: Self-pay | Admitting: *Deleted

## 2016-02-08 ENCOUNTER — Encounter (HOSPITAL_COMMUNITY): Payer: Self-pay | Admitting: Psychiatry

## 2016-02-08 ENCOUNTER — Encounter (HOSPITAL_COMMUNITY): Payer: Self-pay

## 2016-02-08 VITALS — BP 150/89 | HR 100 | Ht 65.0 in | Wt 212.0 lb

## 2016-02-08 DIAGNOSIS — F1310 Sedative, hypnotic or anxiolytic abuse, uncomplicated: Secondary | ICD-10-CM | POA: Diagnosis not present

## 2016-02-08 DIAGNOSIS — F329 Major depressive disorder, single episode, unspecified: Secondary | ICD-10-CM | POA: Diagnosis not present

## 2016-02-08 DIAGNOSIS — R45851 Suicidal ideations: Secondary | ICD-10-CM | POA: Insufficient documentation

## 2016-02-08 DIAGNOSIS — Z791 Long term (current) use of non-steroidal anti-inflammatories (NSAID): Secondary | ICD-10-CM | POA: Insufficient documentation

## 2016-02-08 DIAGNOSIS — E119 Type 2 diabetes mellitus without complications: Secondary | ICD-10-CM | POA: Diagnosis not present

## 2016-02-08 DIAGNOSIS — Z7984 Long term (current) use of oral hypoglycemic drugs: Secondary | ICD-10-CM | POA: Diagnosis not present

## 2016-02-08 DIAGNOSIS — M1990 Unspecified osteoarthritis, unspecified site: Secondary | ICD-10-CM | POA: Insufficient documentation

## 2016-02-08 DIAGNOSIS — F419 Anxiety disorder, unspecified: Secondary | ICD-10-CM | POA: Diagnosis not present

## 2016-02-08 DIAGNOSIS — F603 Borderline personality disorder: Secondary | ICD-10-CM | POA: Insufficient documentation

## 2016-02-08 DIAGNOSIS — R51 Headache: Secondary | ICD-10-CM | POA: Diagnosis not present

## 2016-02-08 DIAGNOSIS — F332 Major depressive disorder, recurrent severe without psychotic features: Secondary | ICD-10-CM | POA: Diagnosis not present

## 2016-02-08 DIAGNOSIS — Z79899 Other long term (current) drug therapy: Secondary | ICD-10-CM | POA: Diagnosis not present

## 2016-02-08 DIAGNOSIS — I10 Essential (primary) hypertension: Secondary | ICD-10-CM | POA: Diagnosis not present

## 2016-02-08 DIAGNOSIS — Z008 Encounter for other general examination: Secondary | ICD-10-CM | POA: Diagnosis present

## 2016-02-08 DIAGNOSIS — Z79818 Long term (current) use of other agents affecting estrogen receptors and estrogen levels: Secondary | ICD-10-CM | POA: Insufficient documentation

## 2016-02-08 DIAGNOSIS — E785 Hyperlipidemia, unspecified: Secondary | ICD-10-CM | POA: Diagnosis not present

## 2016-02-08 LAB — RAPID URINE DRUG SCREEN, HOSP PERFORMED
AMPHETAMINES: NOT DETECTED
BARBITURATES: NOT DETECTED
Benzodiazepines: POSITIVE — AB
Cocaine: NOT DETECTED
OPIATES: NOT DETECTED
TETRAHYDROCANNABINOL: NOT DETECTED

## 2016-02-08 LAB — BASIC METABOLIC PANEL
Anion gap: 10 (ref 5–15)
BUN: 12 mg/dL (ref 6–20)
CHLORIDE: 103 mmol/L (ref 101–111)
CO2: 24 mmol/L (ref 22–32)
CREATININE: 0.79 mg/dL (ref 0.44–1.00)
Calcium: 9 mg/dL (ref 8.9–10.3)
GFR calc Af Amer: >60 mL/min (ref >60)
GFR calc non Af Amer: >60 mL/min (ref >60)
GLUCOSE: 139 mg/dL — AB (ref 65–99)
POTASSIUM: 4.4 mmol/L (ref 3.5–5.1)
SODIUM: 137 mmol/L (ref 135–145)

## 2016-02-08 LAB — CBC WITH DIFFERENTIAL/PLATELET
Basophils Absolute: 0.1 10*3/uL (ref 0.0–0.1)
Basophils Relative: 1 %
EOS ABS: 0.3 10*3/uL (ref 0.0–0.7)
EOS PCT: 3 %
HCT: 46.5 % — ABNORMAL HIGH (ref 36.0–46.0)
HEMOGLOBIN: 15.4 g/dL — AB (ref 12.0–15.0)
LYMPHS ABS: 3 10*3/uL (ref 0.7–4.0)
LYMPHS PCT: 31 %
MCH: 26.8 pg (ref 26.0–34.0)
MCHC: 33.1 g/dL (ref 30.0–36.0)
MCV: 80.9 fL (ref 78.0–100.0)
MONOS PCT: 7 %
Monocytes Absolute: 0.7 10*3/uL (ref 0.1–1.0)
NEUTROS PCT: 58 %
Neutro Abs: 5.7 10*3/uL (ref 1.7–7.7)
Platelets: 242 10*3/uL (ref 150–400)
RBC: 5.75 MIL/uL — ABNORMAL HIGH (ref 3.87–5.11)
RDW: 14 % (ref 11.5–15.5)
WBC: 9.6 10*3/uL (ref 4.0–10.5)

## 2016-02-08 LAB — CBG MONITORING, ED
GLUCOSE-CAPILLARY: 103 mg/dL — AB (ref 65–99)
GLUCOSE-CAPILLARY: 134 mg/dL — AB (ref 65–99)

## 2016-02-08 LAB — ETHANOL: ALCOHOL ETHYL (B): 6 mg/dL — AB (ref <5)

## 2016-02-08 MED ORDER — PRAVASTATIN SODIUM 40 MG PO TABS: 40.0000 mg | ORAL_TABLET | Freq: Every day | ORAL | Status: DC

## 2016-02-08 MED ORDER — METOPROLOL TARTRATE 25 MG PO TABS
25.0000 mg | ORAL_TABLET | Freq: Two times a day (BID) | ORAL | Status: DC
  Administered 2016-02-08: 25 mg via ORAL
  Filled 2016-02-08: qty 1

## 2016-02-08 MED ORDER — ESTRADIOL 2 MG PO TABS: 2.0000 mg | ORAL_TABLET | Freq: Every day | ORAL | Status: DC

## 2016-02-08 MED ORDER — CANAGLIFLOZIN 100 MG PO TABS: 100.0000 mg | ORAL_TABLET | Freq: Every day | ORAL | Status: DC

## 2016-02-08 MED ORDER — METFORMIN HCL 500 MG PO TABS
500.0000 mg | ORAL_TABLET | Freq: Two times a day (BID) | ORAL | Status: DC
  Administered 2016-02-08: 500 mg via ORAL
  Filled 2016-02-08: qty 1

## 2016-02-08 MED ORDER — VENLAFAXINE HCL ER 37.5 MG PO CP24: 150.0000 mg | ORAL_CAPSULE | Freq: Every day | ORAL | Status: DC

## 2016-02-08 MED ORDER — MEDROXYPROGESTERONE ACETATE 2.5 MG PO TABS: 2.5000 mg | ORAL_TABLET | Freq: Every day | ORAL | Status: DC

## 2016-02-08 MED ORDER — CLONAZEPAM 0.5 MG PO TABS
0.5000 mg | ORAL_TABLET | Freq: Three times a day (TID) | ORAL | Status: DC
  Administered 2016-02-08: 0.5 mg via ORAL
  Filled 2016-02-08 (×2): qty 1

## 2016-02-08 MED ORDER — LINAGLIPTIN 5 MG PO TABS
5.0000 mg | ORAL_TABLET | Freq: Every day | ORAL | Status: DC
  Administered 2016-02-08: 5 mg via ORAL
  Filled 2016-02-08: qty 1

## 2016-02-08 MED ORDER — LISINOPRIL 10 MG PO TABS: 20.0000 mg | ORAL_TABLET | ORAL | Status: DC

## 2016-02-08 MED ORDER — SUMATRIPTAN SUCCINATE 25 MG PO TABS
50.0000 mg | ORAL_TABLET | Freq: Once | ORAL | Status: DC
  Filled 2016-02-08: qty 2

## 2016-02-08 MED ORDER — ARIPIPRAZOLE 2 MG PO TABS
2.0000 mg | ORAL_TABLET | Freq: Every day | ORAL | Status: DC
  Administered 2016-02-08: 2 mg via ORAL
  Filled 2016-02-08: qty 1

## 2016-02-08 MED ORDER — NAPROXEN SODIUM 220 MG PO TABS: 220.0000 mg | ORAL_TABLET | Freq: Two times a day (BID) | ORAL | Status: DC | PRN

## 2016-02-08 MED ORDER — ARIPIPRAZOLE 2 MG PO TABS
ORAL_TABLET | ORAL | Status: AC
  Filled 2016-02-08: qty 1

## 2016-02-08 MED ORDER — CYCLOBENZAPRINE HCL 10 MG PO TABS: 10.0000 mg | ORAL_TABLET | Freq: Three times a day (TID) | ORAL | Status: DC | PRN

## 2016-02-08 NOTE — ED Notes (Signed)
Pt undergoing TTS monitoring at this time.

## 2016-02-08 NOTE — ED Notes (Signed)
Pt watching tv, sitter remains at bedside, denies any complaints

## 2016-02-08 NOTE — ED Notes (Signed)
Pt wanded by security in triage. 

## 2016-02-08 NOTE — ED Notes (Signed)
Pt watching tv, sitter remains at bedside, lights dimmed for comfort,

## 2016-02-08 NOTE — Telephone Encounter (Signed)
Please try to call pt and see if she went to the hospital. If not, work her in this am

## 2016-02-08 NOTE — ED Notes (Signed)
Pt given shower, sitter remains at bedside,

## 2016-02-08 NOTE — Telephone Encounter (Signed)
Opened in Error.
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
## 2016-02-08 NOTE — ED Notes (Signed)
Pt accepted at [facility], no beds available at this time.
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
## 2016-02-08 NOTE — ED Notes (Signed)
Pt evaluated by Lily Kocher, PA

## 2016-02-08 NOTE — ED Notes (Signed)
Shower given.

## 2016-02-08 NOTE — ED Notes (Signed)
Pt reports suicidal ideation for the past couple of weeks.  Pt says she has been under a lot of stress.  Denies HI, denies any auditory of visual hallucinations.

## 2016-02-08 NOTE — ED Notes (Signed)
Pt lying calmly, equal rise and fall of chest noted.

## 2016-02-08 NOTE — Telephone Encounter (Signed)
Pt called at 8:02am on 02-08-16 stating she would like to see Dr. Harrington Challenger today. Asked pt for little more details and she stated that she's been having suicidal thoughts and depression for the last couple weeks but just haven't called office. Per pt, it went from not bad to worse about 5-6 days ago. Per pt, she is feeling hopeless and feel like she need to be admitted. Asked pt if she is currently suicidal and who is with her currently and she stated no not really right now but her boyfriend is with her. Per pt, but he is getting ready to leave shortly. Asked pt is she have a plan and she stated no. Asked pt if her boyfriend is aware with what's going on and she stated yes and got her verbal permission to speak with him. Spoke with pt boyfriend and he did confirm what Ms Etcitty was saying. Asked pt boyfriend if was available to take pt to hospital and he stated yes he would. Got pt back on the phone and informed her she would need to go to the Emergency Room to get evaluated due to what she is stating. Informed pt she do not want to go to the ER because all they are going to do is give her medications that Dr. Harrington Challenger don't agree with. Per pt, all she need is for Dr. Harrington Challenger to either change her medications or something. Informed pt that due to what she she stated (think she need to get admitted) she would need to go to the emergency because office do not want her to be left alone if her boyfriend left. Per pt, she have another friend that is suppose to be coming shortly and did not state time. Office staff put pt on a brief hold and called [facility] dispatch and spoke with Jenny Reichmann and he had an office go out to pt place of resident for a safety check. Staff put John on hold to check on pt to make sure she was ok. Put pt on hold again to make sure John had sent dispatch to pt apartment and he stated an officer just got to pt residence and will call office back. Came back on the phone with pt. Office asked pt again if she  could go to the hospital with her boyfriend and she stated ok but she have to think about it. Informed pt that due to what she stated going to the hospital is a safe thing to do because office did not want her to be left alone. Then a few minutes later pt stated that someone is at the door and she thinks its her friend and hung up.

## 2016-02-08 NOTE — ED Notes (Signed)
Pt given meal tray.

## 2016-02-08 NOTE — ED Provider Notes (Signed)
CSN: [id number]     Arrival date & time 02/08/16  0945 History   First MD Initiated Contact with Patient 02/08/16 [phone]     Chief Complaint  Patient presents with  . V70.1     (Consider location/radiation/quality/duration/timing/severity/associated sxs/prior Treatment) HPI Comments: Patient is a 50 year old female who presents to the emergency department with a complaint of them under a lot of stress and I'm considering suicide.  Patient has a history of depression, she's had multiple emergency department visits as well as inpatient treatments for depression. She also suffers from diabetes mellitus, migraine headaches, and hypertension.  The patient states that she has been dealing with a lot of issues in her life. She will not be specific as to what it is causing her to be depressed. She states however that she is considering suicidal ideations. This is been going on since Saturday, February 4. When asked if she had a specific plan, she states that she would consider cutting her wrists, or overdosing on pills. She states that she has a lot of pills at home that she could use to accomplish this. She states that up to this point she has not done anything to attempt to harm herself or harm anyone else. The patient is on medication for depression. The patient states she has not missed any of her medication and she takes it regularly. She denies nausea, vomiting, loss of consciousness, abdominal pain, chest pain, or any other problems other than occasional problem with headache.  The history is provided by the patient.    Past Medical History  Diagnosis Date  . Hypertension   . Diabetes mellitus   . Arthritis   . Anxiety   . Headache(784.0)   . History of borderline personality disorder   . Hyperlipidemia   . Depression   . Personality disorder    Past Surgical History  Procedure Laterality Date  . Foot surgery    . Total abdominal hysterectomy w/ bilateral salpingoophorectomy      Family History  Problem Relation Age of Onset  . Depression Mother   . OCD Other   . ADD / ADHD Neg Hx   . Alcohol abuse Neg Hx   . Drug abuse Neg Hx   . Anxiety disorder Neg Hx   . Bipolar disorder Neg Hx   . Dementia Neg Hx   . Paranoid behavior Neg Hx   . Schizophrenia Neg Hx   . Seizures Neg Hx   . Sexual abuse Neg Hx   . Physical abuse Neg Hx   . Ovarian cancer Sister   . Cirrhosis Sister    Social History  Substance Use Topics  . Smoking status: Never Smoker   . Smokeless tobacco: Never Used  . Alcohol Use: No   OB History    No data available     Review of Systems  Neurological: Positive for headaches.  Psychiatric/Behavioral: Positive for suicidal ideas and dysphoric mood.  All other systems reviewed and are negative.     Allergies  Neurontin; Lamictal; and Trazodone and nefazodone  Home Medications   Prior to Admission medications   Medication Sig Start Date End Date Taking? Authorizing Provider  ARIPiprazole (ABILIFY) 2 MG tablet Take 1 tablet (2 mg total) by mouth at bedtime. 12/29/15   Cloria Spring, MD  canagliflozin (INVOKANA) 100 MG TABS tablet Take 1 tablet (100 mg total) by mouth daily. 02/05/15   Kerrie Buffalo, NP  clonazePAM (KLONOPIN) 0.5 MG tablet Take 1 tablet (0.5 mg  total) by mouth 3 (three) times daily. 12/29/15   Cloria Spring, MD  cyclobenzaprine (FLEXERIL) 10 MG tablet Take 1 tablet (10 mg total) by mouth 3 (three) times daily as needed for muscle spasms. 02/05/15   Kerrie Buffalo, NP  estradiol (ESTRACE) 2 MG tablet Take 1 tablet (2 mg total) by mouth daily. 02/05/15   Kerrie Buffalo, NP  linagliptin (TRADJENTA) 5 MG TABS tablet Take 5 mg by mouth daily.    Historical Provider, MD  lisinopril (PRINIVIL,ZESTRIL) 20 MG tablet Take 1 tablet (20 mg total) by mouth every morning. 07/19/13   Ruben Im, PA-C  medroxyPROGESTERone (PROVERA) 5 MG tablet Take 0.5 tablets (2.5 mg total) by mouth daily. 02/05/15   Kerrie Buffalo, NP   medroxyPROGESTERone (PROVERA) 5 MG tablet TAKE 1/2 TABLET ONCE A DAY Patient not taking: Reported on 12/29/2015 12/22/15   Florian Buff, MD  metFORMIN (GLUCOPHAGE) 500 MG tablet Take 1 tablet (500 mg total) by mouth 2 (two) times daily with a meal. For diabetes control 04/26/13   Niel Hummer, NP  metoprolol tartrate (LOPRESSOR) 25 MG tablet Take 1 tablet (25 mg total) by mouth 2 (two) times daily. For hypertension 04/26/13   Niel Hummer, NP  naproxen sodium (ALEVE) 220 MG tablet Take 220 mg by mouth 2 (two) times daily between meals as needed.     Historical Provider, MD  pravastatin (PRAVACHOL) 40 MG tablet Take 1 tablet (40 mg total) by mouth daily. 02/05/15   Kerrie Buffalo, NP  rizatriptan (MAXALT) 10 MG tablet Take 10 mg by mouth as needed. 08/28/15   Historical Provider, MD  venlafaxine XR (EFFEXOR XR) 150 MG 24 hr capsule Take 1 capsule (150 mg total) by mouth daily with breakfast. 12/29/15   Cloria Spring, MD   BP 128/75 mmHg  Pulse 84  Temp(Src) 98.1 F (36.7 C) (Oral)  Resp 16  Ht 5\' 5"  (1.651 m)  Wt 95.255 kg  BMI 34.95 kg/m2  SpO2 94% Physical Exam  Constitutional: She is oriented to person, place, and time. She appears well-developed and well-nourished.  Non-toxic appearance.  HENT:  Head: Normocephalic.  Right Ear: Tympanic membrane and external ear normal.  Left Ear: Tympanic membrane and external ear normal.  Eyes: EOM and lids are normal. Pupils are equal, round, and reactive to light.  Neck: Normal range of motion. Neck supple. Carotid bruit is not present.  Cardiovascular: Normal rate, regular rhythm, normal heart sounds, intact distal pulses and normal pulses.   Pulmonary/Chest: Breath sounds normal. No respiratory distress.  Abdominal: Soft. Bowel sounds are normal. There is no tenderness. There is no guarding.  Musculoskeletal: Normal range of motion.  Lymphadenopathy:       Head (right side): No submandibular adenopathy present.       Head (left side): No  submandibular adenopathy present.    She has no cervical adenopathy.  Neurological: She is alert and oriented to person, place, and time. She has normal strength. No cranial nerve deficit or sensory deficit.  Skin: Skin is warm and dry.  Psychiatric: She has a normal mood and affect. Her speech is normal. She expresses suicidal ideation. She expresses no homicidal ideation. She expresses suicidal plans. She expresses no homicidal plans.  Nursing note and vitals reviewed.   ED Course  Procedures (including critical care time) Labs Review Labs Reviewed  CBC WITH DIFFERENTIAL/PLATELET - Abnormal; Notable for the following:    RBC 5.75 (*)    Hemoglobin 15.4 (*)  HCT 46.5 (*)    All other components within normal limits  BASIC METABOLIC PANEL - Abnormal; Notable for the following:    Glucose, Bld 139 (*)    All other components within normal limits  URINE RAPID DRUG SCREEN, HOSP PERFORMED - Abnormal; Notable for the following:    Benzodiazepines POSITIVE (*)    All other components within normal limits  ETHANOL - Abnormal; Notable for the following:    Alcohol, Ethyl (B) 6 (*)    All other components within normal limits  CBG MONITORING, ED - Abnormal; Notable for the following:    Glucose-Capillary 103 (*)    All other components within normal limits    Imaging Review No results found. I have personally reviewed and evaluated these images and lab results as part of my medical decision-making.   EKG Interpretation None      MDM Vital signs reviewed.  Patient medically cleared, TTS consult requested.  Recheck: Patient sitting up in bed, resting comfortably. No evidence of distress at this time.   Pt has been accepted at [facility]. No bed available at this time.   Final diagnoses:  Suicidal ideation    **I have reviewed nursing notes, vital signs, and all appropriate lab and imaging results for this patient.Lily Kocher, PA-C 02/10/16 2153  Merrily Pew, MD 02/19/16 [phone]

## 2016-02-08 NOTE — Progress Notes (Signed)
Patient under review at [facility] and at [facility].  Verlon Setting, [place] Disposition staff 02/08/2016 11:14 PM

## 2016-02-08 NOTE — Telephone Encounter (Signed)
Noted. Pt can into office

## 2016-02-08 NOTE — ED Notes (Signed)
Pt changed into scrubs and wanded by security  

## 2016-02-09 ENCOUNTER — Inpatient Hospital Stay (HOSPITAL_COMMUNITY)
Admission: EM | Admit: 2016-02-09 | Discharge: 2016-02-13 | DRG: 885 | Disposition: A | Discharge status: Home / Self Care | Payer: Medicare Other | Source: Intra-hospital | Attending: Psychiatry | Admitting: Psychiatry

## 2016-02-09 ENCOUNTER — Encounter (HOSPITAL_COMMUNITY): Payer: Self-pay | Admitting: *Deleted

## 2016-02-09 DIAGNOSIS — Z62810 Personal history of physical and sexual abuse in childhood: Secondary | ICD-10-CM | POA: Diagnosis present

## 2016-02-09 DIAGNOSIS — G43909 Migraine, unspecified, not intractable, without status migrainosus: Secondary | ICD-10-CM | POA: Diagnosis present

## 2016-02-09 DIAGNOSIS — F411 Generalized anxiety disorder: Secondary | ICD-10-CM | POA: Diagnosis present

## 2016-02-09 DIAGNOSIS — Z62811 Personal history of psychological abuse in childhood: Secondary | ICD-10-CM | POA: Diagnosis present

## 2016-02-09 DIAGNOSIS — F332 Major depressive disorder, recurrent severe without psychotic features: Principal | ICD-10-CM | POA: Diagnosis present

## 2016-02-09 DIAGNOSIS — G4700 Insomnia, unspecified: Secondary | ICD-10-CM | POA: Diagnosis present

## 2016-02-09 DIAGNOSIS — I10 Essential (primary) hypertension: Secondary | ICD-10-CM | POA: Diagnosis present

## 2016-02-09 DIAGNOSIS — Z818 Family history of other mental and behavioral disorders: Secondary | ICD-10-CM | POA: Diagnosis not present

## 2016-02-09 DIAGNOSIS — F603 Borderline personality disorder: Secondary | ICD-10-CM | POA: Diagnosis present

## 2016-02-09 DIAGNOSIS — E785 Hyperlipidemia, unspecified: Secondary | ICD-10-CM | POA: Diagnosis present

## 2016-02-09 DIAGNOSIS — E119 Type 2 diabetes mellitus without complications: Secondary | ICD-10-CM | POA: Diagnosis present

## 2016-02-09 DIAGNOSIS — Z8041 Family history of malignant neoplasm of ovary: Secondary | ICD-10-CM | POA: Diagnosis not present

## 2016-02-09 DIAGNOSIS — E7800 Pure hypercholesterolemia, unspecified: Secondary | ICD-10-CM | POA: Diagnosis present

## 2016-02-09 DIAGNOSIS — F329 Major depressive disorder, single episode, unspecified: Secondary | ICD-10-CM | POA: Diagnosis not present

## 2016-02-09 DIAGNOSIS — Z90710 Acquired absence of both cervix and uterus: Secondary | ICD-10-CM

## 2016-02-09 DIAGNOSIS — R45851 Suicidal ideations: Secondary | ICD-10-CM | POA: Diagnosis present

## 2016-02-09 LAB — GLUCOSE, CAPILLARY
Glucose-Capillary: 131 mg/dL — ABNORMAL HIGH (ref 65–99)
Glucose-Capillary: 128 mg/dL — ABNORMAL HIGH (ref 65–99)
Glucose-Capillary: 170 mg/dL — ABNORMAL HIGH (ref 65–99)
Glucose-Capillary: 126 mg/dL — ABNORMAL HIGH (ref 65–99)

## 2016-02-09 LAB — CBG MONITORING, ED: Glucose-Capillary: 121 mg/dL — ABNORMAL HIGH (ref 65–99)

## 2016-02-09 MED ORDER — SUMATRIPTAN SUCCINATE 25 MG PO TABS: 25.0000 mg | ORAL_TABLET | ORAL | Status: DC | PRN

## 2016-02-09 MED ORDER — ESTRADIOL 2 MG PO TABS
2.0000 mg | ORAL_TABLET | Freq: Every day | ORAL | Status: DC
  Administered 2016-02-09 – 2016-02-13 (×5): 2 mg via ORAL
  Filled 2016-02-09 (×7): qty 1

## 2016-02-09 MED ORDER — ACETAMINOPHEN 325 MG PO TABS: 650.0000 mg | ORAL_TABLET | Freq: Four times a day (QID) | ORAL | Status: DC | PRN

## 2016-02-09 MED ORDER — ALUM & MAG HYDROXIDE-SIMETH 200-200-20 MG/5ML PO SUSP: 30.0000 mL | ORAL | Status: DC | PRN

## 2016-02-09 MED ORDER — CLONAZEPAM 0.5 MG PO TABS
0.5000 mg | ORAL_TABLET | Freq: Three times a day (TID) | ORAL | Status: DC
  Administered 2016-02-09 – 2016-02-11 (×7): 0.5 mg via ORAL
  Filled 2016-02-09 (×8): qty 1

## 2016-02-09 MED ORDER — CANAGLIFLOZIN 100 MG PO TABS
100.0000 mg | ORAL_TABLET | Freq: Every day | ORAL | Status: DC
  Administered 2016-02-09 – 2016-02-13 (×5): 100 mg via ORAL
  Filled 2016-02-09 (×7): qty 1

## 2016-02-09 MED ORDER — ARIPIPRAZOLE 2 MG PO TABS
2.0000 mg | ORAL_TABLET | Freq: Every day | ORAL | Status: DC
  Administered 2016-02-09 – 2016-02-12 (×4): 2 mg via ORAL
  Filled 2016-02-09 (×7): qty 1

## 2016-02-09 MED ORDER — VENLAFAXINE HCL ER 150 MG PO CP24
150.0000 mg | ORAL_CAPSULE | Freq: Every day | ORAL | Status: DC
  Administered 2016-02-09: 150 mg via ORAL
  Filled 2016-02-09 (×3): qty 1

## 2016-02-09 MED ORDER — LISINOPRIL 20 MG PO TABS
20.0000 mg | ORAL_TABLET | ORAL | Status: DC
  Administered 2016-02-09 – 2016-02-13 (×5): 20 mg via ORAL
  Filled 2016-02-09 (×8): qty 1

## 2016-02-09 MED ORDER — DIPHENHYDRAMINE HCL 25 MG PO CAPS
25.0000 mg | ORAL_CAPSULE | Freq: Once | ORAL | Status: AC
  Administered 2016-02-09: 25 mg via ORAL
  Filled 2016-02-09 (×2): qty 1

## 2016-02-09 MED ORDER — METOPROLOL TARTRATE 25 MG PO TABS
25.0000 mg | ORAL_TABLET | Freq: Two times a day (BID) | ORAL | Status: DC
  Administered 2016-02-09 – 2016-02-13 (×9): 25 mg via ORAL
  Filled 2016-02-09 (×13): qty 1

## 2016-02-09 MED ORDER — LINAGLIPTIN 5 MG PO TABS
5.0000 mg | ORAL_TABLET | Freq: Every day | ORAL | Status: DC
  Administered 2016-02-09 – 2016-02-13 (×5): 5 mg via ORAL
  Filled 2016-02-09 (×7): qty 1

## 2016-02-09 MED ORDER — MAGNESIUM HYDROXIDE 400 MG/5ML PO SUSP: 30.0000 mL | Freq: Every day | ORAL | Status: DC | PRN

## 2016-02-09 MED ORDER — MEDROXYPROGESTERONE ACETATE 2.5 MG PO TABS
2.5000 mg | ORAL_TABLET | Freq: Every day | ORAL | Status: DC
  Administered 2016-02-09 – 2016-02-13 (×5): 2.5 mg via ORAL
  Filled 2016-02-09 (×7): qty 1

## 2016-02-09 MED ORDER — PRAVASTATIN SODIUM 40 MG PO TABS
40.0000 mg | ORAL_TABLET | Freq: Every day | ORAL | Status: DC
  Administered 2016-02-09 – 2016-02-13 (×5): 40 mg via ORAL
  Filled 2016-02-09 (×8): qty 1

## 2016-02-09 MED ORDER — METFORMIN HCL 500 MG PO TABS
500.0000 mg | ORAL_TABLET | Freq: Two times a day (BID) | ORAL | Status: DC
  Administered 2016-02-09 – 2016-02-13 (×9): 500 mg via ORAL
  Filled 2016-02-09 (×13): qty 1

## 2016-02-09 MED ORDER — VENLAFAXINE HCL ER 75 MG PO CP24
225.0000 mg | ORAL_CAPSULE | Freq: Every day | ORAL | Status: DC
  Administered 2016-02-10 – 2016-02-13 (×4): 225 mg via ORAL
  Filled 2016-02-09 (×6): qty 1
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
## 2016-02-09 NOTE — BHH Group Notes (Signed)
[facility] LCSW Aftercare Discharge Planning Group Note  02/09/2016 8:45 AM  Pt did not attend, declined invitation.   Peri Maris, [place] 02/09/2016 10:06 AM

## 2016-02-09 NOTE — Tx Team (Signed)
Initial Interdisciplinary Treatment Plan   PATIENT STRESSORS: Financial difficulties Health problems   PATIENT STRENGTHS: Average or above average intelligence Capable of independent living Communication skills General fund of knowledge Supportive family/friends   PROBLEM LIST: Problem List/Patient Goals Date to be addressed Date deferred Reason deferred Estimated date of resolution  "suicidal" 02-09-16     "depressed" 02-09-16       "to get better" 02-09-16       "to talk about things" 02-09-16       "coping skills" 02-09-16                                DISCHARGE CRITERIA:  Improved stabilization in mood, thinking, and/or behavior Need for constant or close observation no longer present Reduction of life-threatening or endangering symptoms to within safe limits Verbal commitment to aftercare and medication compliance  PRELIMINARY DISCHARGE PLAN: Attend aftercare/continuing care group Outpatient therapy Participate in family therapy Return to previous living arrangement  PATIENT/FAMIILY INVOLVEMENT: This treatment plan has been presented to and reviewed with the patient, [name], and/or family member.  The patient and family have been given the opportunity to ask questions and make suggestions.  Zoe Lan 02/09/2016, 4:34 AM

## 2016-02-09 NOTE — ED Notes (Signed)
Pt resting on right side, resp even and non labored, sitter remains at bedside,

## 2016-02-09 NOTE — ED Notes (Signed)
Report given to Katharine Look, RN at [facility],
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
## 2016-02-09 NOTE — Progress Notes (Signed)
Patient ID: [name], female   DOB: [birth year], 50 y.o.   MRN: [id number] PER STATE REGULATIONS 482.30  THIS CHART WAS REVIEWED FOR MEDICAL NECESSITY WITH RESPECT TO THE PATIENT'S ADMISSION/DURATION OF STAY.  NEXT REVIEW DATE:02/13/16  Roma Schanz, RN, BSN CASE MANAGER

## 2016-02-09 NOTE — Progress Notes (Signed)
Patient ID: [name], female   DOB: 1966/02/06, 50 y.o.   MRN: [id number] Client is a 50 yo female recently discharged on 02/01/16, admitted voluntarily with complaints of increased depression and suicidal ideations. Client has medical history of diabetes, HTN, and high cholesterol. Client is pleasant and soft spoken during this admission. Client oriented to unit/room. Staff will monitor q51min for safety. Client is safe on the unit.

## 2016-02-09 NOTE — BHH Suicide Risk Assessment (Signed)
[place] INPATIENT:  Family/Significant Other Suicide Prevention Education  Suicide Prevention Education:  Patient Refusal for Family/Significant Other Suicide Prevention Education: The patient [name] has refused to provide written consent for family/significant other to be provided Family/Significant Other Suicide Prevention Education during admission and/or prior to discharge.  Physician notified.  Bo Mcclintock 02/09/2016, 1:29 PM

## 2016-02-09 NOTE — BHH Group Notes (Deleted)
[facility] LCSW Aftercare Discharge Planning Group Note  02/09/2016 8:45 AM  Pt attended group but left early while gagging.   Peri Maris, [place] 02/09/2016 9:53 AM

## 2016-02-09 NOTE — ED Provider Notes (Signed)
Patient has been accepted at [facility] by Dr. Parke Poisson.   Delora Fuel, MD [phone]

## 2016-02-09 NOTE — Progress Notes (Signed)
Nursing Note: 0700-1900  D:  Pt. presents depressed with flat affect.  Rates depression 9/10, hopelessness 9/10 and anxiety 8/10 on Self Inventory today.  She reports that she does have thoughts of hurting herself, "by overdosing."  Reports that she does not have another plan and is able to contract verbally for safety with RN.  Pt declined two doses of Klonopin today and requested a dose before bedtime instead. This RN moved time to 2100 per pt's request.  A:  Encouraged to verbalize needs and concerns, active listening and support provided.  Continued Q 15 minute safety checks.   R:  Pt. denies A/V hallucinations and is currently  able to verbally contract for safety.

## 2016-02-09 NOTE — Progress Notes (Signed)
[place] Group Notes:  (Nursing/MHT/Case Management/Adjunct)  Date:  02/09/2016  Time:  11:39 PM  Type of Therapy:  Psychoeducational Skills  Participation Level:  Active  Participation Quality:  Appropriate  Affect:  Flat  Cognitive:  Appropriate  Insight:  Good  Engagement in Group:  Engaged  Modes of Intervention:  Education  Summary of Progress/Problems: The patient states that she didn't have a very good day since she was admitted to the hospital at approximately 4:30 am and therefore slept for much of the day. The patient did indicate that she had a good talk with her doctor however. As for the theme of the day, her relapse prevention strategy will involve not "worrying so much".   Archie Balboa S 02/09/2016, 11:39 PM

## 2016-02-09 NOTE — ED Notes (Signed)
[facility] here to transport pt
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
## 2016-02-09 NOTE — BHH Group Notes (Signed)
[place] LCSW Group Therapy 02/09/2016 1:15pm  Type of Therapy: Group Therapy- Feelings Around Relapse and Recovery  Pt did not attend, declined invitation.   Norman Clay [phone] 02/09/2016 4:33 PM

## 2016-02-09 NOTE — Tx Team (Signed)
Interdisciplinary Treatment Plan Update (Adult) Date: 02/09/2016   Date: 02/09/2016 1:24 PM  Progress in Treatment:  Attending groups: Pt is new to milieu, continuing to assess  Participating in groups: Pt is new to milieu, continuing to assess  Taking medication as prescribed: Yes  Tolerating medication: Yes  Family/Significant othe contact made: No, Pt declines Patient understands diagnosis: Yes AEB seeking help for depression  Discussing patient identified problems/goals with staff: Yes  Medical problems stabilized or resolved: Yes  Denies suicidal/homicidal ideation: No, recently admitted with SI Patient has not harmed self or Others: Yes   New problem(s) identified: None identified at this time.   Discharge Plan or Barriers: Pt will return home and follow-up with [facility] in [place].  Additional comments:  Patient and CSW reviewed pt's identified goals and treatment plan. Patient verbalized understanding and agreed to treatment plan. CSW reviewed [facility] "Discharge Process and Patient Involvement" Form. Pt verbalized understanding of information provided and signed form.   Reason for Continuation of Hospitalization:  Anxiety Depression Medication stabilization Suicidal ideation  Estimated length of stay: 3-5 days  Review of initial/current patient goals per problem list:   1.  Goal(s): Patient will participate in aftercare plan  Met:  Yes  Target date: 3-5 days from date of admission   As evidenced by: Patient will participate within aftercare plan AEB aftercare provider and housing plan at discharge being identified.   02/09/16: Pt will return home and follow-up with [facility] in [place]  2.  Goal (s): Patient will exhibit decreased depressive symptoms and suicidal ideations.  Met:  No  Target date: 3-5 days from date of admission   As evidenced by: Patient will utilize self rating of depression at 3 or below and demonstrate decreased signs of  depression or be deemed stable for discharge by MD. 02/09/16: Pt was admitted with symptoms of depression, rating 10/10. Pt continues to present with flat affect and depressive symptoms.  Pt will demonstrate decreased symptoms of depression and rate depression at 3/10 or lower prior to discharge.  3.  Goal(s): Patient will demonstrate decreased signs and symptoms of anxiety.  Met:  No  Target date: 3-5 days from date of admission   As evidenced by: Patient will utilize self rating of anxiety at 3 or below and demonstrated decreased signs of anxiety, or be deemed stable for discharge by MD 02/09/16: Pt was admitted with increased levels of anxiety and is currently rating those symptoms highly. Pt will demonstrated decreased symptoms of anxiety and rate it at 3/10 prior to d/c.  Attendees:  Patient:    Family:    Physician: Dr. Parke Poisson, MD  02/09/2016 1:24 PM  Nursing: Lars Pinks, RN Case manager  02/09/2016 1:24 PM  Clinical Social Worker Peri Maris, [facility] 02/09/2016 1:24 PM  Other: Tilden Fossa, LCSWA 02/09/2016 1:24 PM  Clinical: Marnee Guarneri, RN; Prudencio Pair, RN 02/09/2016 1:24 PM  Other: , RN Charge Nurse 02/09/2016 1:24 PM  Other:     Peri Maris, [place] Work [phone]

## 2016-02-10 LAB — GLUCOSE, CAPILLARY
GLUCOSE-CAPILLARY: 126 mg/dL — AB (ref 65–99)
Glucose-Capillary: 110 mg/dL — ABNORMAL HIGH (ref 65–99)

## 2016-02-10 LAB — TSH: TSH: 1.938 u[IU]/mL (ref 0.350–4.500)

## 2016-02-10 LAB — LIPID PANEL
Cholesterol: 194 mg/dL (ref 0–200)
HDL: 47 mg/dL (ref >40)
LDL CALC: 105 mg/dL — AB (ref 0–99)
Total CHOL/HDL Ratio: 4.1 RATIO
Triglycerides: 208 mg/dL — ABNORMAL HIGH (ref <150)
VLDL: 42 mg/dL — ABNORMAL HIGH (ref 0–40)

## 2016-02-10 NOTE — BHH Group Notes (Signed)
[facility] Group Notes:  (Nursing/MHT/Case Management/Adjunct)  Date:  02/10/2016  Time:  0900 am  Type of Therapy:  Psychoeducational Skills (Healthy coping skills)  Participation Level:  Did Not Attend Invited; declined to attend  Zipporah Plants 02/10/2016, 9:52 AM

## 2016-02-10 NOTE — Plan of Care (Signed)
Problem: Diagnosis: Increased Risk For Suicide Attempt Goal: LTG-Patient Will Report Improved Mood and Deny Suicidal LTG (by discharge) Patient will report improved mood and deny suicidal ideation.  Outcome: Progressing Patient continues to express severe depression and suicidal ideation.

## 2016-02-10 NOTE — BHH Group Notes (Signed)
[facility] LCSW Group Therapy  02/10/2016  10:30 AM to 11:30 PM  Type of Therapy:  Group Therapy  Participation Level: Minimal  Participation Quality: Hesitant  Affect:  Flat, blunted  Cognitive: Oriented  Insight: Limited  Engagement in Therapy: Limited  Modes of Intervention:  Discussion, Exploration, Problem-solving, Rapport Building, Socialization and Support   Summary of Progress/Problems: The main focus of today's process group was for the patient to identify ways in which they have in the past sabotaged their own recovery. Motivational Interviewing was utilized to ask the group members what they get out of their self sabotaging behaviors, and what reasons they may have for wanting to change. The Stages of Change were explained using a handout, and patients identified where they currently are with regard to stages of change. [name] presented with flat blunted affect and only engaged when asked direct questions. She identified isolation as a sign of decompensation and reported she will often stay in her home without getting dressed on daily basis for a week to 10 days. Pt reported hopelessness as "I've been here 10 times, not sure I can change." She is in contemplation stage.   Sheilah Pigeon, LCSW

## 2016-02-10 NOTE — Progress Notes (Signed)
D: Pt how is alert and oriented x 4. Pt who looked anxious complained of moderate anxiety; she states, "My anxiety has not been as bad as it used to be; I think that is because I feel a little safe in here." Pt also endorses mild depression just for being here. Pt however, denies, pain, SI/HI and AVH. Pt remained calm and cooperative through the shift assessment  A: Medications offered as prescribed.  Support, encouragement, and safe environment provided.  15-minute safety checks continue.  R: Pt was med compliant.  Pt attended wrap-up group. Safety checks continue.

## 2016-02-10 NOTE — Progress Notes (Signed)
D.  Pt pleasant on approach, denies complaints at this time.  Positive for evening wrap up group, interacting appropriately with peers on the unit.  Denies SI/HI/hallucinatons at this time.  Would like to have HS CBGs discontinued as she is not on sliding scale insulin and takes her  Metformin twice a day.  A.  Support and encouragement offered, called and got HS CBG held for tonight.  R.  Pt remains safe on the unit, will continue to monitor.

## 2016-02-10 NOTE — Progress Notes (Signed)
D: Patient continues to feel a lot of anxiety and depressive symptoms.  She remains passively suicidal with no specific plan; she is able to contract for safety on the unit.  Patient rates her depression and hopelessness as a 9; anxiety as a 7.  Patient isolates to room, choosing not to attend group.  She forwards little with staff and peers.  She states she slept fair; appetite is good; and concentration is poor.  She denies HI/AVH.  Patient doesn't have a particular goal to work on today.  She denies any physical pain.  She presents with flat, blunted affect; anxious and depressed mood. A: Continue to monitor medication management and MD orders. Safety checks completed every 15 minutes per protocol.  Offer support and encouragement as needed. R: Patient encouraged to attend group and not isolate to room.
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
## 2016-02-11 DIAGNOSIS — F332 Major depressive disorder, recurrent severe without psychotic features: Secondary | ICD-10-CM | POA: Insufficient documentation

## 2016-02-11 LAB — GLUCOSE, CAPILLARY
GLUCOSE-CAPILLARY: 112 mg/dL — AB (ref 65–99)
Glucose-Capillary: 150 mg/dL — ABNORMAL HIGH (ref 65–99)

## 2016-02-11 MED ORDER — CLONAZEPAM 0.5 MG PO TABS
0.5000 mg | ORAL_TABLET | Freq: Three times a day (TID) | ORAL | Status: DC
  Administered 2016-02-11 – 2016-02-13 (×5): 0.5 mg via ORAL
  Filled 2016-02-11 (×5): qty 1
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
## 2016-02-11 NOTE — [facility] Group Notes (Signed)
[place] Group Notes: (Nursing/MHT/Case Management/Adjunct)  Date: 02/11/2016  Time: 945am  Type of Therapy: Nurse Education  Participation Level: Did not attend  Participation Quality:   Affect:  Cognitive:  Insight:   Engagement in Group: Did not attend  Modes of Intervention: Discussion and Education  Summary of Progress/Problems: Group topic was [employer]. Discussed appropriate goal setting. She chose not to attend.  Barbette Or Jermane Brayboy 02/11/2016, 10:52 AM

## 2016-02-11 NOTE — Progress Notes (Signed)
[place] Group Notes:  (Nursing/MHT/Case Management/Adjunct)  Date:  02/11/2016  Time:  12:08 AM  Type of Therapy:  Psychoeducational Skills  Participation Level:  Active  Participation Quality:  Appropriate  Affect:  Appropriate  Cognitive:  Appropriate  Insight:  Improving  Engagement in Group:  Improving  Modes of Intervention:  Education  Summary of Progress/Problems: Patient stated in group last evening that she had a better as a whole. The patient attributed her positive day to spending time in her bedroom and because she made time to attend group as well. In terms of the theme of the day, her coping skill involves being less isolative.   Caulin Begley S 02/11/2016, 12:08 AM

## 2016-02-11 NOTE — Progress Notes (Signed)
Patient ID: IBIS STEVEN, female   DOB: 01/09/1966, 50 y.o.   MRN: [id number]  Pt currently presents with a blunted affect and depressed behavior. Per self inventory, pt rates depression at a 7, hopelessness 7 and anxiety 6. Pt's daily goal is to "going home" and they intend to do so by "talk to doctor." Pt reports fair sleep, a good appetite, normal energy and good concentration.   Pt provided with medications per providers orders. Pt's labs and vitals were monitored throughout the day. Pt supported emotionally and encouraged to express concerns and questions. Pt educated on medications. Pt reports that she wishes to have her CBG drawn before she gets her metformin only, provider notified.   Pt's safety ensured with 15 minute and environmental checks. Pt currently denies SI/HI and A/V hallucinations. Pt verbally agrees to seek staff if SI/HI or A/VH occurs and to consult with staff before acting on these thoughts. CBG order changed, see MAR. Will continue POC.

## 2016-02-11 NOTE — Plan of Care (Signed)
Problem: Diagnosis: Increased Risk For Suicide Attempt Goal: LTG-Patient Will Report Improved Mood and Deny Suicidal LTG (by discharge) Patient will report improved mood and deny suicidal ideation.  Outcome: Progressing Pt has denied suicidal ideation and was observed with bright affect interacting in day room watching TV with peers

## 2016-02-11 NOTE — Progress Notes (Signed)
D.  Pt pleasant on approach, denies complaints at this time.  Positive for evening wrap up group, interacting appropriately with peers on the unit.  Pt pleased that her Klonopin is now available for a bedtime dose.  Denies SI/HI/hallucinations at this time.  A.  Support and encouragement offered  R.  Pt remains safe on the unit, will continue to monitor.

## 2016-02-11 NOTE — BHH Group Notes (Signed)
   [facility] LCSW Group Therapy Note   02/11/2016 10:45 AM   Type of Therapy and Topic: Group Therapy: Feelings Around Returning Home & Establishing a Supportive Framework and Activity to Identify signs of Improvement or Decompensation   Participation Level: Active   Description of Group:  Patients first processed thoughts and feelings about up coming discharge. These included fears of upcoming changes, lack of change, new living environments, judgements and expectations from others and overall stigma of MH issues. We then discussed what is a supportive framework? What does it look like feel like and how do I discern it from and unhealthy non-supportive network? Learn how to cope when supports are not helpful and don't support you. Discuss what to do when your family/friends are not supportive.   Therapeutic Goals Addressed in Processing Group:  1. Patient will identify one healthy supportive network that they can use at discharge. 2. Patient will identify one factor of a supportive framework and how to tell it from an unhealthy network. 3. Patient able to identify one coping skill to use when they do not have positive supports from others. 4. Patient will demonstrate ability to communicate their needs through discussion and/or role plays.  Summary of Patient Progress:  Pt engaged more easily during group session and affect appeared brighter. As patients processed their anxiety about discharge and described healthy supports patient  Reports the hospital schedule is somewhat helpful as she has tendency to forgo a schedule at home.  Patient chose a visual to represent decompensation as  Homelessness and improvement as a supportive network. As opatient described fears of children leaving and possibility of losing her home patient was encouraged by others to work with therapist on plan to ensure she does not lose her home.   Mackenzie Pigeon, LCSW

## 2016-02-12 LAB — HEMOGLOBIN A1C
HEMOGLOBIN A1C: 6.3 % — AB (ref 4.8–5.6)
Mean Plasma Glucose: 134 mg/dL

## 2016-02-12 LAB — GLUCOSE, CAPILLARY
GLUCOSE-CAPILLARY: 117 mg/dL — AB (ref 65–99)
GLUCOSE-CAPILLARY: 151 mg/dL — AB (ref 65–99)
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
## 2016-02-12 NOTE — BHH Group Notes (Signed)
[facility] LCSW Aftercare Discharge Planning Group Note  02/12/2016 8:45 AM  Participation Quality: Alert, Appropriate and Oriented  Mood/Affect: Flat  Depression Rating: 6  Anxiety Rating: 6  Thoughts of Suicide: Pt denies SI/HI  Will you contract for safety? Yes  Current AVH: Pt denies  Plan for Discharge/Comments: Pt attended discharge planning group and actively participated in group. CSW discussed suicide prevention education with the group and encouraged them to discuss discharge planning and any relevant barriers. Pt reports that she feels better today and is making improvement.   Transportation Means: Pt reports access to transportation  Supports: No supports mentioned at this time  Peri Maris, [place] 02/12/2016 10:09 AM

## 2016-02-12 NOTE — Progress Notes (Signed)
[facility] Group Notes:  (Nursing/MHT/Case Management/Adjunct)  Date:  02/12/2016  Time:  12:07 AM  Type of Therapy:  Psychoeducational Skills  Participation Level:  Active  Participation Quality:  Appropriate  Affect:  Appropriate  Cognitive:  Appropriate  Insight:  Good  Engagement in Group:  Engaged  Modes of Intervention:  Education  Summary of Progress/Problems: The patient expressed in group that she had a better day. She states that she napped and that she had a good family visit as well. As for the theme of the day, her support system consists of her boyfriend.   [name] S 02/12/2016, 12:07 AM

## 2016-02-12 NOTE — BHH Group Notes (Signed)
[facility] LCSW Group Therapy  02/12/2016 1:15pm  Type of Therapy:  Group Therapy vercoming Obstacles  Participation Level:  Active  Participation Quality:  Appropriate   Affect:  Appropriate  Cognitive:  Appropriate and Oriented  Insight:  Developing/Improving and Improving  Engagement in Therapy:  Improving  Modes of Intervention:  Discussion, Exploration, Problem-solving and Support  Description of Group:   In this group patients will be encouraged to explore what they see as obstacles to their own wellness and recovery. They will be guided to discuss their thoughts, feelings, and behaviors related to these obstacles. The group will process together ways to cope with barriers, with attention given to specific choices patients can make. Each patient will be challenged to identify changes they are motivated to make in order to overcome their obstacles. This group will be process-oriented, with patients participating in exploration of their own experiences as well as giving and receiving support and challenge from other group members.  Summary of Patient Progress: Pt participated actively in group discussion. She identified feeling pressure as a single parent as her primary obstacle. 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 Pt had difficulty praising herself for this accomplishment, expressing that she needs to be "over it" by now.   Therapeutic Modalities:   Cognitive Behavioral Therapy Solution Focused Therapy Motivational Interviewing Relapse Prevention Therapy   Peri Maris, LCSWA 02/12/2016 3:43 PM

## 2016-02-12 NOTE — Progress Notes (Signed)
D: Pt presents with flat affect and depressed mood. Pt appeared sad during shift assessment. Pt rates depression 7/10. Anxiety 6/10. Hopeless 7/10. Pt reports fair sleep but also stated that she had a difficult time falling asleep last night and didn't fall asleep until 2 am. Pt denies suicidal thoughts. Pt requesting to discharge home. Pt stated that she do have a support system in place. Pt verbalized that her mother and friend are both supportive.  A: Medications reviewed with pt. Medications administered as ordered per MD. Verbal support provided. Pt encouraged to attend groups. 15 minute checks performed for safety. R: Pt verbalized understanding of med regimen. Pt stated goal "going home". Pt receptive to tx.

## 2016-02-13 LAB — GLUCOSE, CAPILLARY: Glucose-Capillary: 119 mg/dL — ABNORMAL HIGH (ref 65–99)

## 2016-02-13 MED ORDER — METOPROLOL TARTRATE 25 MG PO TABS: 25.0000 mg | ORAL_TABLET | Freq: Two times a day (BID) | ORAL | Status: DC

## 2016-02-13 MED ORDER — LINAGLIPTIN 5 MG PO TABS: 5.0000 mg | ORAL_TABLET | Freq: Every day | ORAL | Status: DC

## 2016-02-13 MED ORDER — VENLAFAXINE HCL ER 75 MG PO CP24: 225.0000 mg | ORAL_CAPSULE | Freq: Every day | ORAL | Status: DC

## 2016-02-13 MED ORDER — MEDROXYPROGESTERONE ACETATE 2.5 MG PO TABS: 2.5000 mg | ORAL_TABLET | Freq: Every day | ORAL | Status: DC

## 2016-02-13 MED ORDER — PRAVASTATIN SODIUM 40 MG PO TABS: 40.0000 mg | ORAL_TABLET | Freq: Every day | ORAL | Status: DC

## 2016-02-13 MED ORDER — ESTRADIOL 2 MG PO TABS: 2.0000 mg | ORAL_TABLET | Freq: Every day | ORAL | Status: DC

## 2016-02-13 MED ORDER — SUMATRIPTAN SUCCINATE 25 MG PO TABS: 25.0000 mg | ORAL_TABLET | ORAL | Status: DC | PRN

## 2016-02-13 MED ORDER — CANAGLIFLOZIN 100 MG PO TABS: 100.0000 mg | ORAL_TABLET | Freq: Every day | ORAL | Status: DC

## 2016-02-13 MED ORDER — METFORMIN HCL 500 MG PO TABS: 500.0000 mg | ORAL_TABLET | Freq: Two times a day (BID) | ORAL | Status: DC

## 2016-02-13 MED ORDER — ARIPIPRAZOLE 2 MG PO TABS: 2.0000 mg | ORAL_TABLET | Freq: Every day | ORAL | Status: DC

## 2016-02-13 MED ORDER — LISINOPRIL 20 MG PO TABS: 20.0000 mg | ORAL_TABLET | ORAL | Status: DC

## 2016-02-13 NOTE — Progress Notes (Signed)
  [facility] Adult Case Management Discharge Plan :  Will you be returning to the same living situation after discharge:  Yes,  Pt returning home At discharge, do you have transportation home?: Yes,  boyfriend to pick up Do you have the ability to pay for your medications: Yes,  Pt provided with prescriptions  Release of information consent forms completed and in the chart;  Patient's signature needed at discharge.  Patient to Follow up at: Follow-up Information    Follow up with [facility].   Specialty:  Behavioral Health   Why:  Your next medication management appointment is on 2/17 at 3:00pm with Dr. Harrington Challenger. Your next therapy appointment is on 2/22 at 8:45am.   Contact information:   [address] [place] [phone]      Next level of care provider has access to [facility] and Suicide Prevention discussed: Yes,  with Pt; declined family contact  Have you used any form of tobacco in the last 30 days? (Cigarettes, Smokeless Tobacco, Cigars, and/or Pipes): No  Has patient been referred to the Quitline?: Patient refused referral  Patient has been referred for addiction treatment: N/A  Bo Mcclintock 02/13/2016, 10:42 AM
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
## 2016-02-13 NOTE — Tx Team (Signed)
Interdisciplinary Treatment Plan Update (Adult) Date: 02/13/2016   Date: 02/13/2016 10:38 AM  Progress in Treatment:  Attending groups: Yes  Participating in groups: Yes  Taking medication as prescribed: Yes  Tolerating medication: Yes  Family/Significant othe contact made: No, Pt declines Patient understands diagnosis: Yes AEB seeking help for depression  Discussing patient identified problems/goals with staff: Yes  Medical problems stabilized or resolved: Yes  Denies suicidal/homicidal ideation: Yes Patient has not harmed self or Others: Yes   New problem(s) identified: None identified at this time.   Discharge Plan or Barriers: Pt will return home and follow-up with [facility] in [place].  Additional comments:  Patient and CSW reviewed pt's identified goals and treatment plan. Patient verbalized understanding and agreed to treatment plan. CSW reviewed [facility] "Discharge Process and Patient Involvement" Form. Pt verbalized understanding of information provided and signed form.   Reason for Continuation of Hospitalization:  Anxiety Depression Medication stabilization Suicidal ideation  Estimated length of stay: 0 days  Review of initial/current patient goals per problem list:   1.  Goal(s): Patient will participate in aftercare plan  Met:  Yes  Target date: 3-5 days from date of admission   As evidenced by: Patient will participate within aftercare plan AEB aftercare provider and housing plan at discharge being identified.   02/09/16: Pt will return home and follow-up with [facility] in [place]  2.  Goal (s): Patient will exhibit decreased depressive symptoms and suicidal ideations.  Met:  Adequate for DC  Target date: 3-5 days from date of admission   As evidenced by: Patient will utilize self rating of depression at 3 or below and demonstrate decreased signs of depression or be deemed stable for discharge by MD. 02/09/16: Pt was admitted with symptoms of  depression, rating 10/10. Pt continues to present with flat affect and depressive symptoms.  Pt will demonstrate decreased symptoms of depression and rate depression at 3/10 or lower prior to discharge. 02/13/16: Pt reports improving mood and MD feels that Pt's symptoms have decreased to the point that they can be managed in an outpatient setting.   3.  Goal(s): Patient will demonstrate decreased signs and symptoms of anxiety.  Met:  Adequate for DC  Target date: 3-5 days from date of admission   As evidenced by: Patient will utilize self rating of anxiety at 3 or below and demonstrated decreased signs of anxiety, or be deemed stable for discharge by MD 02/09/16: Pt was admitted with increased levels of anxiety and is currently rating those symptoms highly. Pt will demonstrated decreased symptoms of anxiety and rate it at 3/10 prior to d/c. 02/13/16: Pt reports improvements in symptoms and MD feels that Pt's symptoms have decreased to the point that they can be managed in an outpatient setting.  Attendees:  Patient:    Family:    Physician: Dr. Parke Poisson, MD  02/13/2016 10:38 AM  Nursing: Lars Pinks, RN Case manager  02/13/2016 10:38 AM  Clinical Social Worker Peri Maris, [place] 02/13/2016 10:38 AM  Other: Tilden Fossa, [place] 02/13/2016 10:38 AM  Clinical: Gaylan Gerold, RN; Mayra Neer, RN  02/13/2016 10:38 AM  Other: , RN Charge Nurse 02/13/2016 10:38 AM  Other:     Peri Maris, [place] Work [phone]
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
## 2016-02-13 NOTE — Progress Notes (Signed)
Discharge note:  Patient discharged home per MD order.  She received all her personal belongings, prescriptions and follow up instructions.  Reviewed discharge instructions/medications/follow up appointments/lab values.  Patient indicated understanding.  She denies SI/HI/AVH.  She left ambulatory with her boyfriend.

## 2016-02-13 NOTE — [facility] Group Notes (Signed)
[facility] Group Notes:  (Nursing/MHT/Case Management/Adjunct)  Date:  02/13/2016  Time:  0900 am  Type of Therapy:  Psychoeducational Skills  Participation Level:  Minimal  Participation Quality:  Resistant  Affect:  Anxious  Cognitive:  Alert  Insight:  Improving  Engagement in Group:  Limited  Modes of Intervention:  Support  Summary of Progress/Problems: Theme self care today.  Patient is focused on discharged.  Patient states,  "I take care of hygiene and I call a friend every day."  Stressed the importance of having a support system.  Zipporah Plants 02/13/2016, 11:04 AM

## 2016-02-13 NOTE — Progress Notes (Addendum)
D: Pt initially observed in bed resting. Pt presents with a depressed affect and mood. Pt was pleasant in interaction  Pt reports not having an adequate amount of sleep the previous night. Pt reports improvement in her mood. Per pt, she is looking forward to discharging soon. Pt attended the evening wrap-up group. Pt denied any SI/HI/AVH. Pt also denied any physical concerns. Pt is compliant with her POC.   A: Writer administered scheduled medications to pt, per MD orders. Continued support and availability as needed was extended to this pt. Staff continues to monitor pt with q21min checks.  R: No adverse drug reactions noted. Pt receptive to treatment. Pt remains safe at this time.

## 2016-02-16 ENCOUNTER — Ambulatory Visit (HOSPITAL_COMMUNITY): Payer: Self-pay | Admitting: Psychiatry

## 2016-02-16 ENCOUNTER — Ambulatory Visit (INDEPENDENT_AMBULATORY_CARE_PROVIDER_SITE_OTHER): Payer: Medicare Other | Admitting: Psychiatry

## 2016-02-16 ENCOUNTER — Encounter (HOSPITAL_COMMUNITY): Payer: Self-pay | Admitting: Psychiatry

## 2016-02-16 VITALS — BP 140/78 | Ht 65.0 in | Wt 213.0 lb

## 2016-02-16 DIAGNOSIS — F332 Major depressive disorder, recurrent severe without psychotic features: Secondary | ICD-10-CM | POA: Diagnosis not present

## 2016-02-16 MED ORDER — CLONAZEPAM 0.5 MG PO TABS: 0.5000 mg | ORAL_TABLET | Freq: Three times a day (TID) | ORAL | Status: DC

## 2016-02-16 MED ORDER — ARIPIPRAZOLE 2 MG PO TABS: 2.0000 mg | ORAL_TABLET | Freq: Every day | ORAL | Status: DC

## 2016-02-16 MED ORDER — VENLAFAXINE HCL ER 75 MG PO CP24: 225.0000 mg | ORAL_CAPSULE | Freq: Every day | ORAL | Status: DC
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
## 2016-02-21 ENCOUNTER — Ambulatory Visit (INDEPENDENT_AMBULATORY_CARE_PROVIDER_SITE_OTHER): Payer: Medicare Other | Admitting: Psychiatry

## 2016-02-21 ENCOUNTER — Encounter (HOSPITAL_COMMUNITY): Payer: Self-pay | Admitting: Psychiatry

## 2016-02-21 DIAGNOSIS — F332 Major depressive disorder, recurrent severe without psychotic features: Secondary | ICD-10-CM | POA: Diagnosis not present

## 2016-02-21 NOTE — Patient Instructions (Signed)
Discussed orally 

## 2016-02-21 NOTE — Progress Notes (Signed)
              THERAPIST PROGRESS NOTE  Session Time:  Wednesday  02/21/2016 9:10 AM -  10:05 AM                          Participation Level: Active  Behavioral Response: euthymic  Type of Therapy: Individual Therapy   Treatment Goals :     1 .Identify replace thoughts that support depression       2. 1ncrease self acceptance decreasing the negative statements about self and increasing positive statements about self       3. Improve ability to manage stress and anxiety with decreased intensity and frequency as of anxiety response (panic attacks, avoidance)       4. Improve interpersonal skills  Treatment Goals addressed  1, 2,,   Interventions: CBT and Supportive,ACT  Summary: [name] is a 50 y.o. female who presents with a long-standing history of chronic recurrent severe depressive symptoms accompanied by chronic anxiety. She has had multiple hospitalizations due to to suicidal attempts and depression. Her current symptoms include depressed mood, anxiety, excessive worrying, panic attacks, and passive suicidal ideations.   Patient was hospitalized at [facility] 4-5 days  since last session due to increased depressed mood and suicidal ideations. She reports no suicidal ideations since discharge. She expresses frustration about having recurrent periods of depression and hoplessness as well as helplessness about being able to manage depression. She says she continues to date boyfriend and has decided not to worry about his lack of a deeper commitment. She says she is not as depressed but still is not engaged in activity and stays in bed a lot. She also reports being stressed about concerns about her friends.   Suicidal/Homicidal: No. Patient agrees to call this practice,call 911, or have someone take her to the ER should symptoms worsen.  Therapist Response: Therapist works with patient to review symptoms, provide psychoeducation about depression and effects on functioning,  identify coping statements to facilitate hope, identify ways to improve self-care and increase involvement in activity,   .Plan: Patient agrees to return for an appointment in 1-2 weeks, use daily planning forms, and bring to next session.    Diagnosis: Axis I: MDD, Recurrent    Axis II: Borderline Personality Dis.    Rosene Pilling, LCSW

## 2016-02-23 ENCOUNTER — Ambulatory Visit (HOSPITAL_COMMUNITY): Payer: Self-pay | Admitting: Psychiatry

## 2016-03-11 ENCOUNTER — Encounter (HOSPITAL_COMMUNITY): Payer: Self-pay | Admitting: Psychiatry

## 2016-03-11 ENCOUNTER — Ambulatory Visit (HOSPITAL_COMMUNITY): Payer: Self-pay | Admitting: Psychiatry

## 2016-03-15 ENCOUNTER — Ambulatory Visit (INDEPENDENT_AMBULATORY_CARE_PROVIDER_SITE_OTHER): Payer: Medicare Other | Admitting: Psychiatry

## 2016-03-15 ENCOUNTER — Encounter (HOSPITAL_COMMUNITY): Payer: Self-pay | Admitting: Psychiatry

## 2016-03-15 VITALS — BP 147/83 | HR 85 | Ht 65.0 in | Wt 211.8 lb

## 2016-03-15 DIAGNOSIS — F332 Major depressive disorder, recurrent severe without psychotic features: Secondary | ICD-10-CM | POA: Diagnosis not present

## 2016-03-15 MED ORDER — ARIPIPRAZOLE 2 MG PO TABS: 2.0000 mg | ORAL_TABLET | Freq: Every day | ORAL | Status: DC

## 2016-03-15 MED ORDER — VENLAFAXINE HCL ER 75 MG PO CP24: 75.0000 mg | ORAL_CAPSULE | Freq: Every day | ORAL | Status: DC

## 2016-03-15 MED ORDER — VILAZODONE HCL 40 MG PO TABS: 40.0000 mg | ORAL_TABLET | Freq: Every day | ORAL | Status: DC
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
## 2016-03-21 ENCOUNTER — Telehealth (HOSPITAL_COMMUNITY): Payer: Self-pay | Admitting: *Deleted

## 2016-03-21 NOTE — Telephone Encounter (Signed)
noted 

## 2016-03-21 NOTE — Telephone Encounter (Signed)
Prior authorization for Viibryd received. Called [phone] spoke with Fara Boros who states that a decision will be made within 24-72 hours and faxed to office.

## 2016-03-25 ENCOUNTER — Ambulatory Visit (INDEPENDENT_AMBULATORY_CARE_PROVIDER_SITE_OTHER): Payer: Medicare Other | Admitting: Psychiatry

## 2016-03-25 ENCOUNTER — Encounter (HOSPITAL_COMMUNITY): Payer: Self-pay | Admitting: Psychiatry

## 2016-03-25 DIAGNOSIS — F332 Major depressive disorder, recurrent severe without psychotic features: Secondary | ICD-10-CM

## 2016-03-25 NOTE — Progress Notes (Signed)
               THERAPIST PROGRESS NOTE  Session Time:  Monday 03/25/2016 9:20 AM - 9:55 AM                       Participation Level: Active  Behavioral Response: euthymic  Type of Therapy: Individual Therapy   Treatment Goals :     1 .Identify replace thoughts that support depression       2. 1ncrease self acceptance decreasing the negative statements about self and increasing positive statements about self       3. Improve ability to manage stress and anxiety with decreased intensity and frequency as of anxiety response (panic attacks, avoidance)       4. Improve interpersonal skills  Treatment Goals addressed  1, 2,,   Interventions: CBT and Supportive,ACT  Summary: [name] is a 50 y.o. female who presents with a long-standing history of chronic recurrent severe depressive symptoms accompanied by chronic anxiety. She has had multiple hospitalizations due to to suicidal attempts and depression. Her current symptoms include depressed mood, anxiety, excessive worrying, panic attacks, and passive suicidal ideations.   Patient  reports little to no change in symptoms since last session. She reports medication compliance but says new antidepressant doesn't seem to be helping. She reports depressed mood, poor motivation, fatigue, loss of joy, and irritability. She admits she has experienced passive suicidal thoughts with no plan and no intent. She is pleased she was assertive with one of her friends this past weekend rather than internalizing her feelings as she has in the past.   Suicidal/Homicidal: No. Patient agrees to call this practice,call 911, or have someone take her to the ER should symptoms worsen.  Therapist Response: Therapist worked with patient to review symptoms, provide psychoeducation about depression discussing lapse versus relapse, identify early signs of depression, assist patient identify strategies patient has used in the past to alleviate symptoms of  depression, assist patient identify four strategies she commits to using until next session to pursue value based living, praise and reinforce patient's use of assertiveness skills.   .Plan: Patient agrees to return for an appointment in 1-2 weeks   Diagnosis: Axis I: MDD, Recurrent    Axis II: Borderline Personality Dis.    Deetta Siegmann, LCSW      TH

## 2016-03-25 NOTE — Patient Instructions (Signed)
Discussed orally 

## 2016-04-04 ENCOUNTER — Ambulatory Visit (INDEPENDENT_AMBULATORY_CARE_PROVIDER_SITE_OTHER): Payer: Medicare Other | Admitting: Psychiatry

## 2016-04-04 ENCOUNTER — Encounter (HOSPITAL_COMMUNITY): Payer: Self-pay | Admitting: Psychiatry

## 2016-04-04 VITALS — BP 134/73 | HR 92 | Ht 65.0 in | Wt 211.6 lb

## 2016-04-04 DIAGNOSIS — F332 Major depressive disorder, recurrent severe without psychotic features: Secondary | ICD-10-CM

## 2016-04-04 MED ORDER — VENLAFAXINE HCL ER 75 MG PO CP24: 75.0000 mg | ORAL_CAPSULE | Freq: Every day | ORAL | Status: DC

## 2016-04-04 MED ORDER — CLONAZEPAM 0.5 MG PO TABS: 0.5000 mg | ORAL_TABLET | Freq: Three times a day (TID) | ORAL | Status: DC

## 2016-04-04 MED ORDER — ARIPIPRAZOLE 2 MG PO TABS: 2.0000 mg | ORAL_TABLET | Freq: Every day | ORAL | Status: DC

## 2016-04-04 MED ORDER — VILAZODONE HCL 40 MG PO TABS: 40.0000 mg | ORAL_TABLET | Freq: Every day | ORAL | Status: DC
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
## 2016-04-17 ENCOUNTER — Encounter (HOSPITAL_COMMUNITY): Payer: Self-pay | Admitting: Psychiatry

## 2016-04-17 ENCOUNTER — Ambulatory Visit (INDEPENDENT_AMBULATORY_CARE_PROVIDER_SITE_OTHER): Payer: Medicare Other | Admitting: Psychiatry

## 2016-04-17 DIAGNOSIS — F332 Major depressive disorder, recurrent severe without psychotic features: Secondary | ICD-10-CM | POA: Diagnosis not present

## 2016-04-17 NOTE — Patient Instructions (Signed)
Discussed orally 

## 2016-04-17 NOTE — Progress Notes (Signed)
                THERAPIST PROGRESS NOTE  Session Time:  Wednesday 04/17/2016 1:05 PM - 2:05 PM                         Participation Level: Active  Behavioral Response: depressed,anxious  Type of Therapy: Individual Therapy   Treatment Goals :     1 .Identify replace thoughts that support depression       2. 1ncrease self acceptance decreasing the negative statements about self and increasing positive statements about self       3. Improve ability to manage stress and anxiety with decreased intensity and frequency as of anxiety response (panic attacks, avoidance)       4. Improve interpersonal skills  Treatment Goals addressed  1, 2,,   Interventions: CBT and Supportive,ACT  Summary: [name] is a 50 y.o. female who presents with a long-standing history of chronic recurrent severe depressive symptoms accompanied by chronic anxiety. She has had multiple hospitalizations due to to suicidal attempts and depression. Her current symptoms include depressed mood, anxiety, excessive worrying, panic attacks, and passive suicidal ideations.   Patient reports increased depressed mood and suicidal ideations since last session. Her friend accompanies her to the initial part of session and reports patient told her she was having suicidal thoughts this past weekend. She visited her friend Sunday for support but became even more depressed when she went home and took 2 Xanax and one Klonopin per patient's report she says at first she just only wanted to go to sleep quickly but also may have been trying to harm self. She called her mother for help she did not want to be alone.  Her mother said patient's sister to pick her up. Patient reports staying overnight with her mother and feeling much better Monday morning. She denies any suicidal ideations since that time as well as any current suicidal ideations. She says she's recently been having increased thoughts of being attracted to women and  reports this has caused confusion as this conflicts with her religious beliefs and she is dating a man. She also reports stress related to her friend beginning to date someone and fears her friend may not have any time for her. She also became upset with another friend who made other plans and did not include patient and her daughter as patient thought she would.  Suicidal/Homicidal: No. Patient agrees to call this practice,call 911, or have someone take her to the ER should symptoms worsen.   Therapist Response:  Reviewed symptoms, praised patient's use of her support system, facilitated expression of feelings, assisted patient identify triggers for suicidal urges, assisted patient identify and verbalize reasons for living when suicidal thoughts and feelings emerge, assisted patient identify statements that promote a sense of hope for self and the future,  assisted patient identify calming and comforting activities to alleviate suicidal urges and identify supportive family members and friends, provided patient with crisis hotline information   .Plan: Patient agrees to return for an appointment in 1-2 weeks. Patient is scheduled to see psychiatrist Dr. Harrington Challenger on May 10 but her name has been placed on the waiting list for an earlier appointment.   Diagnosis: Axis I: MDD, Recurrent    Axis II: Borderline Personality Dis.    Zaim Nitta, LCSW

## 2016-05-01 ENCOUNTER — Encounter (HOSPITAL_COMMUNITY): Payer: Self-pay | Admitting: Psychiatry

## 2016-05-01 ENCOUNTER — Ambulatory Visit (INDEPENDENT_AMBULATORY_CARE_PROVIDER_SITE_OTHER): Payer: Medicare Other | Admitting: Psychiatry

## 2016-05-01 DIAGNOSIS — F332 Major depressive disorder, recurrent severe without psychotic features: Secondary | ICD-10-CM

## 2016-05-01 NOTE — Patient Instructions (Signed)
Discussed orally 

## 2016-05-01 NOTE — Progress Notes (Signed)
                 THERAPIST PROGRESS NOTE  Session Time:  Wednesday 05/01/2016 1:15 PM - 2:00 PM  Participation Level: Active  Behavioral Response: less depressed  Type of Therapy: Individual Therapy   Treatment Goals :     1 .Identify replace thoughts that support depression       2. 1ncrease self acceptance decreasing the negative statements about self and increasing positive statements about self       3. Improve ability to manage stress and anxiety with decreased intensity and frequency as of anxiety response (panic attacks, avoidance)       4. Improve interpersonal skills  Treatment Goals addressed  1, 2,,   Interventions: CBT and Supportive,ACT  Summary: [name] is a 50 y.o. female who presents with a long-standing history of chronic recurrent severe depressive symptoms accompanied by chronic anxiety. She has had multiple hospitalizations due to to suicidal attempts and depression. Her current symptoms include depressed mood, anxiety, excessive worrying, panic attacks, and passive suicidal ideations.   Patient reports improved mood, increased motivation, increased involvement in activity, and denies any suicidal ideations since last session. She states the medicine seems to really be helping. However, she expresses concern about possible side effect of increased appetite and expresses frustration she has gained weight. She has been doing household chores and helping her mother with errands. She also has been socializing with family and friends. She has experienced periods of depression and sadness but has been able to identify her triggers. She also has used support from family and friends as well as self talk to try to cope. Patient also reports improved use of assertiveness skills.   Suicidal/Homicidal: No.   Therapist Response:  Reviewed symptoms, praised patient's use of assertiveness skills and discussed effects on patient's mood and behavior, facilitated  expression of feelings, assisted patient identify ways to improve self-care regarding nutrition and exercise, began to review treatment plan   .Plan: Patient agrees to return for an appointment in 1-2 weeks.   Diagnosis: Axis I: MDD, Recurrent    Axis II: Borderline Personality Dis.    Shawntae Lowy, LCSW      T

## 2016-05-08 ENCOUNTER — Ambulatory Visit (INDEPENDENT_AMBULATORY_CARE_PROVIDER_SITE_OTHER): Payer: Medicare Other | Admitting: Psychiatry

## 2016-05-08 ENCOUNTER — Encounter (HOSPITAL_COMMUNITY): Payer: Self-pay | Admitting: Psychiatry

## 2016-05-08 VITALS — BP 141/76 | HR 84 | Ht 65.0 in | Wt 211.8 lb

## 2016-05-08 DIAGNOSIS — F332 Major depressive disorder, recurrent severe without psychotic features: Secondary | ICD-10-CM | POA: Diagnosis not present

## 2016-05-08 MED ORDER — VENLAFAXINE HCL ER 75 MG PO CP24: 75.0000 mg | ORAL_CAPSULE | Freq: Every day | ORAL | Status: DC

## 2016-05-08 MED ORDER — VILAZODONE HCL 40 MG PO TABS: 40.0000 mg | ORAL_TABLET | Freq: Every day | ORAL | Status: DC

## 2016-05-08 MED ORDER — ARIPIPRAZOLE 2 MG PO TABS: 2.0000 mg | ORAL_TABLET | Freq: Every day | ORAL | Status: DC
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
## 2016-05-16 ENCOUNTER — Ambulatory Visit (HOSPITAL_COMMUNITY): Payer: Self-pay | Admitting: Psychiatry

## 2016-05-17 DIAGNOSIS — E782 Mixed hyperlipidemia: Secondary | ICD-10-CM | POA: Diagnosis not present

## 2016-05-17 DIAGNOSIS — E119 Type 2 diabetes mellitus without complications: Secondary | ICD-10-CM | POA: Diagnosis not present

## 2016-05-22 DIAGNOSIS — F319 Bipolar disorder, unspecified: Secondary | ICD-10-CM | POA: Diagnosis not present

## 2016-05-22 DIAGNOSIS — I10 Essential (primary) hypertension: Secondary | ICD-10-CM | POA: Diagnosis not present

## 2016-05-22 DIAGNOSIS — E119 Type 2 diabetes mellitus without complications: Secondary | ICD-10-CM | POA: Diagnosis not present

## 2016-05-22 DIAGNOSIS — E782 Mixed hyperlipidemia: Secondary | ICD-10-CM | POA: Diagnosis not present

## 2016-05-29 ENCOUNTER — Encounter (HOSPITAL_COMMUNITY): Payer: Self-pay | Admitting: Psychiatry

## 2016-05-29 ENCOUNTER — Ambulatory Visit (INDEPENDENT_AMBULATORY_CARE_PROVIDER_SITE_OTHER): Payer: Medicare Other | Admitting: Psychiatry

## 2016-05-29 DIAGNOSIS — F332 Major depressive disorder, recurrent severe without psychotic features: Secondary | ICD-10-CM | POA: Diagnosis not present

## 2016-05-29 NOTE — Patient Instructions (Signed)
Discussed orally 

## 2016-05-29 NOTE — Progress Notes (Signed)
  THERAPIST PROGRESS NOTE  Session Time:  Wednesday  05/29/2016 1:11 PM  2:05 PM       Participation Level: Active  Behavioral Response: less depressed  Type of Therapy: Individual Therapy   Treatment Goals :     1 .Identify replace thoughts that support depression       2. 1ncrease self acceptance decreasing the negative statements about self and increasing positive statements about self       3. Improve ability to manage stress and anxiety with decreased intensity and frequency as of anxiety response (panic attacks, avoidance)       4. Improve interpersonal skills  Treatment Goals addressed  3,4   Interventions: CBT and Supportive,ACT  Summary: [name] is a 50 y.o. female who presents with a long-standing history of chronic recurrent severe depressive symptoms accompanied by chronic anxiety. She has had multiple hospitalizations due to to suicidal attempts and depression. Her current symptoms include depressed mood, anxiety, excessive worrying, panic attacks, and passive suicidal ideations.   Patient reports improved mood, increased motivation, and  increased involvement in activity since last session until this past weekend when she had conflict with a friend. Patient reports being overwhelmed by this, experiencing anxiety, and having passive suicidal ideations with no plan and no intent this past Sunday.  She states feeling better today as she and friend have reconciled. However, she continues to express concerns regarding relationship due to feeling responsible for friend and having difficulty telling friend no per patient's report. .   Suicidal/Homicidal: No.   Therapist Response:  Reviewed symptoms, facilitated expression of feelings, assisted patient identify ways to improve interpersonal skills in relationship with friend, discussed ways to improve assertiveness skills using "I" messages, saying no, and making requests    .Plan: Patient agrees to return for an appointment  in 1-2 weeks.   Diagnosis: Axis I: MDD, Recurrent    Axis II: Borderline Personality Dis.    Mackenzie Lahman, LCSW

## 2016-06-12 ENCOUNTER — Ambulatory Visit (HOSPITAL_COMMUNITY): Payer: Self-pay | Admitting: Psychiatry

## 2016-06-19 ENCOUNTER — Encounter (HOSPITAL_COMMUNITY): Payer: Self-pay | Admitting: Psychiatry

## 2016-06-19 ENCOUNTER — Ambulatory Visit (INDEPENDENT_AMBULATORY_CARE_PROVIDER_SITE_OTHER): Payer: Medicare Other | Admitting: Psychiatry

## 2016-06-19 VITALS — BP 134/67 | HR 89 | Ht 65.0 in | Wt 210.4 lb

## 2016-06-19 DIAGNOSIS — F332 Major depressive disorder, recurrent severe without psychotic features: Secondary | ICD-10-CM | POA: Diagnosis not present

## 2016-06-19 DIAGNOSIS — R21 Rash and other nonspecific skin eruption: Secondary | ICD-10-CM | POA: Diagnosis not present

## 2016-06-19 DIAGNOSIS — G43009 Migraine without aura, not intractable, without status migrainosus: Secondary | ICD-10-CM | POA: Diagnosis not present

## 2016-06-19 MED ORDER — VENLAFAXINE HCL ER 75 MG PO CP24: 75.0000 mg | ORAL_CAPSULE | Freq: Every day | ORAL | Status: DC

## 2016-06-19 MED ORDER — CLONAZEPAM 0.5 MG PO TABS: 0.5000 mg | ORAL_TABLET | Freq: Three times a day (TID) | ORAL | Status: DC

## 2016-06-19 MED ORDER — VILAZODONE HCL 40 MG PO TABS: 40.0000 mg | ORAL_TABLET | Freq: Every day | ORAL | Status: DC

## 2016-06-19 MED ORDER — ARIPIPRAZOLE 2 MG PO TABS: 2.0000 mg | ORAL_TABLET | Freq: Every day | ORAL | Status: DC
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
## 2016-06-26 ENCOUNTER — Ambulatory Visit (INDEPENDENT_AMBULATORY_CARE_PROVIDER_SITE_OTHER): Payer: Medicare Other | Admitting: Psychiatry

## 2016-06-26 ENCOUNTER — Encounter (HOSPITAL_COMMUNITY): Payer: Self-pay | Admitting: Psychiatry

## 2016-06-26 DIAGNOSIS — F332 Major depressive disorder, recurrent severe without psychotic features: Secondary | ICD-10-CM

## 2016-06-26 NOTE — Patient Instructions (Signed)
Discussed orally 

## 2016-06-26 NOTE — Progress Notes (Signed)
   THERAPIST PROGRESS NOTE  Session Time:  Wednesday  06/26/2016  2:13 PM - 3:05 PM          Participation Level: Active  Behavioral Response: less depressed  Type of Therapy: Individual Therapy   Treatment Goals :     1 .Identify replace thoughts that support depression       2. 1ncrease self acceptance decreasing the negative statements about self and increasing positive statements about self       3. Improve ability to manage stress and anxiety with decreased intensity and frequency as of anxiety response (panic attacks, avoidance)       4. Improve interpersonal skills  Treatment Goals addressed  ,4   Interventions: CBT and Supportive,ACT  Summary: [name] is a 50 y.o. female who presents with a long-standing history of chronic recurrent severe depressive symptoms accompanied by chronic anxiety. She has had multiple hospitalizations due to to suicidal attempts and depression. Her current symptoms include depressed mood, anxiety, excessive worrying, panic attacks, and passive suicidal ideations.   Patient reports continued improved mood, increased motivation, and  increased involvement in activity since last session. She has been going different places with her boyfriend and her daughter. She has also continue to socialize with other friends. 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 She continues to struggle with self acceptance. Patient denies any suicidal or homicidal ideations since last session.   Suicidal/Homicidal: No.   Therapist Response:  Reviewed symptoms, facilitated expression of feelings, reviewed treatment plan, discussed recent incident in which patient was assertive with a friend, discuss the effects of patient's use of assertiveness on self and interaction with others    .Plan: Patient agrees to return for an appointment in 1-2 weeks.   Diagnosis: Axis I: MDD, Recurrent    Axis II: Borderline Personality Dis.    Jacayla Nordell,  LCSW      Kemiya Batdorf, LCSW 06/26/2016

## 2016-07-10 ENCOUNTER — Encounter (HOSPITAL_COMMUNITY): Payer: Self-pay | Admitting: Psychiatry

## 2016-07-10 ENCOUNTER — Ambulatory Visit (INDEPENDENT_AMBULATORY_CARE_PROVIDER_SITE_OTHER): Payer: Medicare Other | Admitting: Psychiatry

## 2016-07-10 DIAGNOSIS — F332 Major depressive disorder, recurrent severe without psychotic features: Secondary | ICD-10-CM | POA: Diagnosis not present

## 2016-07-10 NOTE — Progress Notes (Signed)
   THERAPIST PROGRESS NOTE  Session Time:  Wednesday  07/10/2016 1:10 PM - 2:00 PM   Participation Level: Active  Behavioral Response: less depressed  Type of Therapy: Individual Therapy   Treatment Goals :     1 .Identify replace thoughts that support depression       2. 1ncrease self acceptance decreasing the negative statements about self and increasing positive statements about self       3. Improve ability to manage stress and anxiety with decreased intensity and frequency as of anxiety response (panic attacks, avoidance)       4. Improve interpersonal skills  Treatment Goals addressed  ,4   Interventions: CBT and Supportive,ACT  Summary: [name] is a 50 y.o. female who presents with a long-standing history of chronic recurrent severe depressive symptoms accompanied by chronic anxiety. She has had multiple hospitalizations due to to suicidal attempts and depression. Her current symptoms include depressed mood, anxiety, excessive worrying, panic attacks, and passive suicidal ideations.   Patient reports being busy and coping well since las session. She reports sometimes feeling depressed but denies any suicidal ideations. She reports breaking up with boyfriend but says they remain friends and continue to see each other. She is interested in exploring her sexuality. She expresses appropriate concern regarding her daughter who has been experiencing mood and behavioral issues. Patient is seeking professional help for daughter and is taking her to [facility] for services. Suicidal/Homicidal: No.   Therapist Response:  Reviewed symptoms, facilitated expression of feelings, discussed ways to provide support for daughter and work with providers regarding daughter's care   .Plan: Patient agrees to return for an appointment in 2 weeks.   Diagnosis: Axis I: MDD, Recurrent    Axis II: Borderline Personality Dis.    BYNUM,PEGGY, LCSW

## 2016-07-10 NOTE — Patient Instructions (Signed)
Discussed orally 

## 2016-07-24 ENCOUNTER — Encounter (HOSPITAL_COMMUNITY): Payer: Self-pay | Admitting: Psychiatry

## 2016-07-24 ENCOUNTER — Ambulatory Visit (INDEPENDENT_AMBULATORY_CARE_PROVIDER_SITE_OTHER): Payer: Medicare Other | Admitting: Psychiatry

## 2016-07-24 DIAGNOSIS — F332 Major depressive disorder, recurrent severe without psychotic features: Secondary | ICD-10-CM

## 2016-07-24 NOTE — Progress Notes (Signed)
   THERAPIST PROGRESS NOTE  Session Time:  Wednesday  7/ 26/2017 1:17 PM - 2:05 PM  Participation Level: Active  Behavioral Response: less depressed  Type of Therapy: Individual Therapy   Treatment Goals :     1 .Identify replace thoughts that support depression       2. 1ncrease self acceptance decreasing the negative statements about self and increasing positive statements about self       3. Improve ability to manage stress and anxiety with decreased intensity and frequency as of anxiety response (panic attacks, avoidance)       4. Improve interpersonal skills  Treatment Goals addressed  ,4   Interventions: CBT and Supportive,ACT  Summary: [name] is a 50 y.o. female who presents with a long-standing history of chronic recurrent severe depressive symptoms accompanied by chronic anxiety. She has had multiple hospitalizations due to to suicidal attempts and depression. Her current symptoms include depressed mood, anxiety, excessive worrying, panic attacks, and passive suicidal ideations.   Patient reports having no panic attacks since last session. She has maintained social involvement and participation in various activities. She has continued to explore her sexuality and reports less conflict and confusion about this. She also reports decreased stress regarding the relationship with her ex-boyfriend as he is respecting her wishes. They remain friends but have not seen each other since last session. She reports some stress regarding her daughter who has begun therapy. However, patient used positive communication with daughter to set boundaries and to also respect daughters privacy regarding therapy. Patient continues to struggle with negative thinking patterns and self acceptance. She also reports continued difficulty with saying no. Suicidal/Homicidal: No.   Therapist Response:  Reviewed symptoms, facilitated expression of feelings, praise and reinforced patient's support for  daughter and positive communication, discuss connection between thoughts/mood/and behavior, assisted patient identify and challenge cognitive distortions regarding a recent incident with a friend, began to identify patterns of self defeating thoughts   .Plan: Patient agrees to return for an appointment in 2 weeks. Patient agrees to review handout on self defeating thoughts provided in session.   Diagnosis: Axis I: MDD, Recurrent    Axis II: Borderline Personality Dis.    Willma Obando, LCSW

## 2016-08-07 ENCOUNTER — Ambulatory Visit (HOSPITAL_COMMUNITY): Payer: Self-pay | Admitting: Psychiatry

## 2016-08-16 ENCOUNTER — Ambulatory Visit (INDEPENDENT_AMBULATORY_CARE_PROVIDER_SITE_OTHER): Payer: Medicare Other | Admitting: Psychiatry

## 2016-08-16 ENCOUNTER — Encounter (HOSPITAL_COMMUNITY): Payer: Self-pay | Admitting: Psychiatry

## 2016-08-16 VITALS — BP 125/73 | HR 83 | Ht 65.0 in | Wt 205.8 lb

## 2016-08-16 DIAGNOSIS — F332 Major depressive disorder, recurrent severe without psychotic features: Secondary | ICD-10-CM | POA: Diagnosis not present

## 2016-08-16 MED ORDER — VENLAFAXINE HCL ER 75 MG PO CP24: 75.0000 mg | ORAL_CAPSULE | Freq: Every day | ORAL | 2 refills | Status: DC

## 2016-08-16 MED ORDER — ARIPIPRAZOLE 2 MG PO TABS: 2.0000 mg | ORAL_TABLET | Freq: Every day | ORAL | 2 refills | Status: DC

## 2016-08-16 MED ORDER — CLONAZEPAM 0.5 MG PO TABS: 0.5000 mg | ORAL_TABLET | Freq: Three times a day (TID) | ORAL | 2 refills | Status: DC

## 2016-08-16 MED ORDER — VILAZODONE HCL 40 MG PO TABS: 40.0000 mg | ORAL_TABLET | Freq: Every day | ORAL | 2 refills | Status: DC
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
## 2016-08-19 ENCOUNTER — Ambulatory Visit (HOSPITAL_COMMUNITY): Payer: Self-pay | Admitting: Psychiatry

## 2016-08-21 ENCOUNTER — Ambulatory Visit (INDEPENDENT_AMBULATORY_CARE_PROVIDER_SITE_OTHER): Payer: Medicare Other | Admitting: Psychiatry

## 2016-08-21 ENCOUNTER — Encounter (HOSPITAL_COMMUNITY): Payer: Self-pay | Admitting: Psychiatry

## 2016-08-21 DIAGNOSIS — F332 Major depressive disorder, recurrent severe without psychotic features: Secondary | ICD-10-CM | POA: Diagnosis not present

## 2016-08-21 NOTE — Progress Notes (Signed)
   THERAPIST PROGRESS NOTE  Session Time:  Wednesday  08/21/2016 1:17 PM -  2:02 PM  Participation Level: Active  Behavioral Response: less depressed  Type of Therapy: Individual Therapy   Treatment Goals :     1 .Identify replace thoughts that support depression       2. 1ncrease self acceptance decreasing the negative statements about self and increasing positive statements about self       3. Improve ability to manage stress and anxiety with decreased intensity and frequency as of anxiety response (panic attacks, avoidance)       4. Improve interpersonal skills  Treatment Goals addressed  1   Interventions: CBT and Supportive,ACT  Summary: [name] is a 50 y.o. female who presents with a long-standing history of chronic recurrent severe depressive symptoms accompanied by chronic anxiety. She has had multiple hospitalizations due to to suicidal attempts and depression. Her current symptoms include depressed mood, anxiety, excessive worrying, panic attacks, and passive suicidal ideations.   Patient reports doing well since last session. She denies any periods of depression and reports no panic attacks since last session. She has maintained social involvement and participation in various activities. She reports improved interaction with her daughter. However, patient expresses worry regarding daughter resuming school next week. Patient fears having nothing to do, staying in bed all day, and possibly becoming very depressed since her daughter will be away from home during the day. Suicidal/Homicidal: No.       Therapist Response:  Reviewed symptoms, facilitated expression of feelings, praised and reinforced patient's involvement in activity, assisted patient identify and replace negative and self-defeating  thoughts about being without daughter during the day, assisted patient to begin to identify pleasant activities to pursue when daughter is away at school,  .Plan: Patient agrees to  return for an appointment in 2 weeks. Patient agrees to review handout provided in session, develop and implement plan to engage in activities during the day when daughter is away.    Diagnosis: Axis I: MDD, Recurrent    Axis II: Borderline Personality Dis.    Damali Broadfoot, LCSW

## 2016-08-22 DIAGNOSIS — Z23 Encounter for immunization: Secondary | ICD-10-CM | POA: Diagnosis not present

## 2016-08-23 DIAGNOSIS — E119 Type 2 diabetes mellitus without complications: Secondary | ICD-10-CM | POA: Diagnosis not present

## 2016-08-23 DIAGNOSIS — E782 Mixed hyperlipidemia: Secondary | ICD-10-CM | POA: Diagnosis not present

## 2016-08-26 DIAGNOSIS — R945 Abnormal results of liver function studies: Secondary | ICD-10-CM | POA: Diagnosis not present

## 2016-08-26 DIAGNOSIS — E782 Mixed hyperlipidemia: Secondary | ICD-10-CM | POA: Diagnosis not present

## 2016-08-26 DIAGNOSIS — E119 Type 2 diabetes mellitus without complications: Secondary | ICD-10-CM | POA: Diagnosis not present

## 2016-08-26 DIAGNOSIS — R51 Headache: Secondary | ICD-10-CM | POA: Diagnosis not present

## 2016-08-26 DIAGNOSIS — F319 Bipolar disorder, unspecified: Secondary | ICD-10-CM | POA: Diagnosis not present

## 2016-08-26 DIAGNOSIS — I10 Essential (primary) hypertension: Secondary | ICD-10-CM | POA: Diagnosis not present

## 2016-09-01 ENCOUNTER — Encounter (HOSPITAL_COMMUNITY): Payer: Self-pay | Admitting: *Deleted

## 2016-09-01 ENCOUNTER — Emergency Department (HOSPITAL_COMMUNITY)
Admission: EM | Admit: 2016-09-01 | Discharge: 2016-09-02 | Disposition: A | Discharge status: Other Acute Inpatient Hospital | Payer: Medicare Other | Attending: Emergency Medicine | Admitting: Emergency Medicine

## 2016-09-01 DIAGNOSIS — F603 Borderline personality disorder: Secondary | ICD-10-CM | POA: Diagnosis not present

## 2016-09-01 DIAGNOSIS — I10 Essential (primary) hypertension: Secondary | ICD-10-CM | POA: Diagnosis not present

## 2016-09-01 DIAGNOSIS — F4310 Post-traumatic stress disorder, unspecified: Secondary | ICD-10-CM | POA: Insufficient documentation

## 2016-09-01 DIAGNOSIS — F429 Obsessive-compulsive disorder, unspecified: Secondary | ICD-10-CM | POA: Diagnosis not present

## 2016-09-01 DIAGNOSIS — R45851 Suicidal ideations: Secondary | ICD-10-CM | POA: Diagnosis not present

## 2016-09-01 DIAGNOSIS — E119 Type 2 diabetes mellitus without complications: Secondary | ICD-10-CM | POA: Insufficient documentation

## 2016-09-01 DIAGNOSIS — F332 Major depressive disorder, recurrent severe without psychotic features: Secondary | ICD-10-CM | POA: Insufficient documentation

## 2016-09-01 DIAGNOSIS — Z79899 Other long term (current) drug therapy: Secondary | ICD-10-CM | POA: Insufficient documentation

## 2016-09-01 LAB — BASIC METABOLIC PANEL
Anion gap: 10 (ref 5–15)
BUN: 12 mg/dL (ref 6–20)
CALCIUM: 9.1 mg/dL (ref 8.9–10.3)
CO2: 24 mmol/L (ref 22–32)
CREATININE: 0.72 mg/dL (ref 0.44–1.00)
Chloride: 105 mmol/L (ref 101–111)
GFR calc non Af Amer: >60 mL/min (ref >60)
Glucose, Bld: 152 mg/dL — ABNORMAL HIGH (ref 65–99)
Potassium: 3.6 mmol/L (ref 3.5–5.1)
SODIUM: 139 mmol/L (ref 135–145)

## 2016-09-01 LAB — CBC WITH DIFFERENTIAL/PLATELET
Basophils Absolute: 0 10*3/uL (ref 0.0–0.1)
Basophils Relative: 1 %
EOS ABS: 0.2 10*3/uL (ref 0.0–0.7)
EOS PCT: 2 %
HCT: 40.6 % (ref 36.0–46.0)
Hemoglobin: 13 g/dL (ref 12.0–15.0)
LYMPHS ABS: 2.4 10*3/uL (ref 0.7–4.0)
Lymphocytes Relative: 38 %
MCH: 26.1 pg (ref 26.0–34.0)
MCHC: 32 g/dL (ref 30.0–36.0)
MCV: 81.5 fL (ref 78.0–100.0)
Monocytes Absolute: 0.6 10*3/uL (ref 0.1–1.0)
Monocytes Relative: 9 %
Neutro Abs: 3.1 10*3/uL (ref 1.7–7.7)
Neutrophils Relative %: 50 %
PLATELETS: 180 10*3/uL (ref 150–400)
RBC: 4.98 MIL/uL (ref 3.87–5.11)
RDW: 14.2 % (ref 11.5–15.5)
WBC: 6.3 10*3/uL (ref 4.0–10.5)

## 2016-09-01 LAB — RAPID URINE DRUG SCREEN, HOSP PERFORMED
AMPHETAMINES: NOT DETECTED
Barbiturates: NOT DETECTED
Benzodiazepines: POSITIVE — AB
Cocaine: NOT DETECTED
OPIATES: NOT DETECTED
Tetrahydrocannabinol: NOT DETECTED

## 2016-09-01 LAB — ETHANOL: Alcohol, Ethyl (B): 10 mg/dL — ABNORMAL HIGH (ref <5)

## 2016-09-01 MED ORDER — VENLAFAXINE HCL ER 37.5 MG PO CP24: 75.0000 mg | ORAL_CAPSULE | Freq: Every day | ORAL | Status: DC

## 2016-09-01 MED ORDER — MEDROXYPROGESTERONE ACETATE 2.5 MG PO TABS: 2.5000 mg | ORAL_TABLET | Freq: Every day | ORAL | Status: DC

## 2016-09-01 MED ORDER — PRAVASTATIN SODIUM 40 MG PO TABS: 40.0000 mg | ORAL_TABLET | Freq: Every day | ORAL | Status: DC

## 2016-09-01 MED ORDER — VILAZODONE HCL 40 MG PO TABS: 40.0000 mg | ORAL_TABLET | Freq: Every day | ORAL | Status: DC

## 2016-09-01 MED ORDER — ARIPIPRAZOLE 2 MG PO TABS
2.0000 mg | ORAL_TABLET | Freq: Every day | ORAL | Status: DC
  Filled 2016-09-01: qty 1

## 2016-09-01 MED ORDER — CLONAZEPAM 0.5 MG PO TABS
0.5000 mg | ORAL_TABLET | Freq: Three times a day (TID) | ORAL | Status: DC
  Administered 2016-09-02: 0.5 mg via ORAL
  Filled 2016-09-01: qty 1

## 2016-09-01 MED ORDER — ESTRADIOL 2 MG PO TABS
2.0000 mg | ORAL_TABLET | Freq: Every day | ORAL | Status: DC
  Filled 2016-09-01 (×2): qty 1

## 2016-09-01 MED ORDER — LISINOPRIL 10 MG PO TABS: 20.0000 mg | ORAL_TABLET | ORAL | Status: DC

## 2016-09-01 MED ORDER — LINAGLIPTIN 5 MG PO TABS
5.0000 mg | ORAL_TABLET | Freq: Every day | ORAL | Status: DC
  Filled 2016-09-01: qty 1

## 2016-09-01 MED ORDER — METOPROLOL TARTRATE 25 MG PO TABS
25.0000 mg | ORAL_TABLET | Freq: Two times a day (BID) | ORAL | Status: DC
Start: 2016-09-01 — End: 2016-09-02
  Filled 2016-09-01: qty 1

## 2016-09-01 MED ORDER — MAGNESIUM 200 MG PO TABS
250.0000 mg | ORAL_TABLET | Freq: Every day | ORAL | Status: DC
  Filled 2016-09-01 (×2): qty 2

## 2016-09-01 MED ORDER — CANAGLIFLOZIN-METFORMIN HCL ER 150-500 MG PO TB24: 1.0000 | ORAL_TABLET | Freq: Two times a day (BID) | ORAL | Status: DC

## 2016-09-01 NOTE — ED Provider Notes (Signed)
D/w TTS [facility].  Patient meets inpatient criteria.  Accepted to [facility] by Dr. Parke Poisson after 1 am.  BP 158/75   Pulse 88   Temp 98.3 F (36.8 C) (Oral)   Resp 16   Ht 5\' 5"  (1.651 m)   Wt 210 lb (95.3 kg)   SpO2 96%   BMI 34.95 kg/m     Ezequiel Essex, MD 09/01/16 [id number]
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
## 2016-09-01 NOTE — ED Triage Notes (Signed)
Pt c/o feeling suicidal that started yesterday; pt states she has been crying and she has a lot of stressors in her life; pt states her plan would be to take pills; pt denies taking any today
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
## 2016-09-02 ENCOUNTER — Encounter (HOSPITAL_COMMUNITY): Payer: Self-pay

## 2016-09-02 ENCOUNTER — Inpatient Hospital Stay (HOSPITAL_COMMUNITY)
Admission: AD | Admit: 2016-09-02 | Discharge: 2016-09-04 | DRG: 885 | Disposition: A | Discharge status: Home / Self Care | Payer: Medicare Other | Source: Intra-hospital | Attending: Psychiatry | Admitting: Psychiatry

## 2016-09-02 DIAGNOSIS — Z818 Family history of other mental and behavioral disorders: Secondary | ICD-10-CM

## 2016-09-02 DIAGNOSIS — F410 Panic disorder [episodic paroxysmal anxiety] without agoraphobia: Secondary | ICD-10-CM | POA: Diagnosis present

## 2016-09-02 DIAGNOSIS — Z62810 Personal history of physical and sexual abuse in childhood: Secondary | ICD-10-CM | POA: Diagnosis present

## 2016-09-02 DIAGNOSIS — Z62812 Personal history of neglect in childhood: Secondary | ICD-10-CM | POA: Diagnosis present

## 2016-09-02 DIAGNOSIS — I10 Essential (primary) hypertension: Secondary | ICD-10-CM | POA: Diagnosis present

## 2016-09-02 DIAGNOSIS — F603 Borderline personality disorder: Secondary | ICD-10-CM | POA: Diagnosis present

## 2016-09-02 DIAGNOSIS — F429 Obsessive-compulsive disorder, unspecified: Secondary | ICD-10-CM | POA: Diagnosis present

## 2016-09-02 DIAGNOSIS — Z62811 Personal history of psychological abuse in childhood: Secondary | ICD-10-CM | POA: Diagnosis present

## 2016-09-02 DIAGNOSIS — Z7984 Long term (current) use of oral hypoglycemic drugs: Secondary | ICD-10-CM | POA: Diagnosis not present

## 2016-09-02 DIAGNOSIS — Z79899 Other long term (current) drug therapy: Secondary | ICD-10-CM

## 2016-09-02 DIAGNOSIS — F332 Major depressive disorder, recurrent severe without psychotic features: Secondary | ICD-10-CM | POA: Diagnosis not present

## 2016-09-02 DIAGNOSIS — Z90710 Acquired absence of both cervix and uterus: Secondary | ICD-10-CM

## 2016-09-02 DIAGNOSIS — F411 Generalized anxiety disorder: Secondary | ICD-10-CM | POA: Diagnosis present

## 2016-09-02 DIAGNOSIS — E785 Hyperlipidemia, unspecified: Secondary | ICD-10-CM | POA: Diagnosis present

## 2016-09-02 DIAGNOSIS — F4310 Post-traumatic stress disorder, unspecified: Secondary | ICD-10-CM | POA: Diagnosis present

## 2016-09-02 DIAGNOSIS — E119 Type 2 diabetes mellitus without complications: Secondary | ICD-10-CM | POA: Diagnosis present

## 2016-09-02 DIAGNOSIS — G4700 Insomnia, unspecified: Secondary | ICD-10-CM | POA: Diagnosis present

## 2016-09-02 DIAGNOSIS — R45851 Suicidal ideations: Secondary | ICD-10-CM | POA: Diagnosis present

## 2016-09-02 LAB — GLUCOSE, CAPILLARY
GLUCOSE-CAPILLARY: 157 mg/dL — AB (ref 65–99)
Glucose-Capillary: 130 mg/dL — ABNORMAL HIGH (ref 65–99)

## 2016-09-02 MED ORDER — ARIPIPRAZOLE 2 MG PO TABS
2.0000 mg | ORAL_TABLET | Freq: Every day | ORAL | Status: DC
  Filled 2016-09-02: qty 1

## 2016-09-02 MED ORDER — HYDROXYZINE HCL 50 MG PO TABS
50.0000 mg | ORAL_TABLET | Freq: Every day | ORAL | Status: DC
  Filled 2016-09-02 (×4): qty 1

## 2016-09-02 MED ORDER — CLONAZEPAM 0.5 MG PO TABS
0.5000 mg | ORAL_TABLET | Freq: Three times a day (TID) | ORAL | Status: DC | PRN
Start: 2016-09-02 — End: 2016-09-04
  Administered 2016-09-02 – 2016-09-04 (×4): 0.5 mg via ORAL
  Filled 2016-09-02 (×4): qty 1

## 2016-09-02 MED ORDER — LISINOPRIL 20 MG PO TABS
20.0000 mg | ORAL_TABLET | ORAL | Status: DC
Start: 2016-09-02 — End: 2016-09-04
  Administered 2016-09-02 – 2016-09-04 (×3): 20 mg via ORAL
  Filled 2016-09-02 (×6): qty 1

## 2016-09-02 MED ORDER — VILAZODONE HCL 40 MG PO TABS
40.0000 mg | ORAL_TABLET | Freq: Every day | ORAL | Status: DC
  Administered 2016-09-02 – 2016-09-04 (×3): 40 mg via ORAL
  Filled 2016-09-02 (×5): qty 1

## 2016-09-02 MED ORDER — LINAGLIPTIN 5 MG PO TABS
5.0000 mg | ORAL_TABLET | Freq: Every day | ORAL | Status: DC
Start: 2016-09-02 — End: 2016-09-04
  Administered 2016-09-02 – 2016-09-04 (×3): 5 mg via ORAL
  Filled 2016-09-02 (×5): qty 1

## 2016-09-02 MED ORDER — MEDROXYPROGESTERONE ACETATE 2.5 MG PO TABS
2.5000 mg | ORAL_TABLET | Freq: Every day | ORAL | Status: DC
  Administered 2016-09-02 – 2016-09-04 (×3): 2.5 mg via ORAL
  Filled 2016-09-02 (×5): qty 1

## 2016-09-02 MED ORDER — PRAVASTATIN SODIUM 40 MG PO TABS
40.0000 mg | ORAL_TABLET | Freq: Every day | ORAL | Status: DC
  Administered 2016-09-02 – 2016-09-04 (×3): 40 mg via ORAL
  Filled 2016-09-02 (×5): qty 1

## 2016-09-02 MED ORDER — CANAGLIFLOZIN-METFORMIN HCL ER 150-500 MG PO TB24: 1.0000 | ORAL_TABLET | Freq: Two times a day (BID) | ORAL | Status: DC

## 2016-09-02 MED ORDER — ESTRADIOL 2 MG PO TABS
2.0000 mg | ORAL_TABLET | Freq: Every day | ORAL | Status: DC
  Administered 2016-09-02 – 2016-09-04 (×3): 2 mg via ORAL
  Filled 2016-09-02 (×5): qty 1

## 2016-09-02 MED ORDER — ACETAMINOPHEN 325 MG PO TABS: 650.0000 mg | ORAL_TABLET | Freq: Four times a day (QID) | ORAL | Status: DC | PRN

## 2016-09-02 MED ORDER — METFORMIN HCL ER 500 MG PO TB24
500.0000 mg | ORAL_TABLET | Freq: Two times a day (BID) | ORAL | Status: DC
  Administered 2016-09-02 – 2016-09-04 (×5): 500 mg via ORAL
  Filled 2016-09-02 (×12): qty 1

## 2016-09-02 MED ORDER — HYDROXYZINE HCL 25 MG PO TABS: 25.0000 mg | ORAL_TABLET | Freq: Every evening | ORAL | Status: DC | PRN

## 2016-09-02 MED ORDER — ARIPIPRAZOLE 5 MG PO TABS
5.0000 mg | ORAL_TABLET | Freq: Every day | ORAL | Status: DC
  Administered 2016-09-02 – 2016-09-03 (×2): 5 mg via ORAL
  Filled 2016-09-02 (×4): qty 1

## 2016-09-02 MED ORDER — CANAGLIFLOZIN 300 MG PO TABS
300.0000 mg | ORAL_TABLET | Freq: Every day | ORAL | Status: DC
  Administered 2016-09-02 – 2016-09-04 (×3): 300 mg via ORAL
  Filled 2016-09-02 (×5): qty 1

## 2016-09-02 MED ORDER — METOPROLOL TARTRATE 25 MG PO TABS
25.0000 mg | ORAL_TABLET | Freq: Two times a day (BID) | ORAL | Status: DC
Start: 2016-09-02 — End: 2016-09-04
  Administered 2016-09-02 – 2016-09-04 (×5): 25 mg via ORAL
  Filled 2016-09-02 (×9): qty 1

## 2016-09-02 MED ORDER — VENLAFAXINE HCL ER 75 MG PO CP24
75.0000 mg | ORAL_CAPSULE | Freq: Every day | ORAL | Status: DC
  Administered 2016-09-02 – 2016-09-04 (×3): 75 mg via ORAL
  Filled 2016-09-02 (×6): qty 1

## 2016-09-02 NOTE — Progress Notes (Signed)
Recreation Therapy Notes  Date: 09/02/16 Time: 0930 Location: [address] Group Room  Group Topic: Stress Management  Goal Area(s) Addresses:  Patient will verbalize importance of using healthy stress management.  Patient will identify positive emotions associated with healthy stress management.   Intervention: Stress Management  Activity :  Guided Imagery.  LRT introduced the stress management technique of guided imagery to the patients.  LRT read a script so patients could participate in the activity.  Patients were to follow along as LRT read script to participate.  Education:  Stress Management, Discharge Planning.   Education Outcome: Needs additional education  Clinical Observations/Feedback: Pt did not attend group.   Victorino Sparrow, LRT/CTRS         Victorino Sparrow A 09/02/2016 12:29 PM
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
## 2016-09-02 NOTE — Progress Notes (Signed)
Patient ID: [name], female   DOB: 1966-04-15, 50 y.o.   MRN: [id number] PER STATE REGULATIONS 482.30  THIS CHART WAS REVIEWED FOR MEDICAL NECESSITY WITH RESPECT TO THE PATIENT'S ADMISSION/ DURATION OF STAY.  NEXT REVIEW DATE: 09/06/2016  Chauncy Lean, RN, BSN CASE MANAGER

## 2016-09-02 NOTE — Tx Team (Signed)
Initial Treatment Plan 09/02/2016 3:52 AM [name] [id number]    PATIENT STRESSORS: Financial difficulties Marital or family conflict   PATIENT STRENGTHS: Curator fund of knowledge Motivation for treatment/growth   PATIENT IDENTIFIED PROBLEMS: depression  Risk for suicide  "want to work on self"  "coping skills"               DISCHARGE CRITERIA:  Ability to meet basic life and health needs Improved stabilization in mood, thinking, and/or behavior Verbal commitment to aftercare and medication compliance  PRELIMINARY DISCHARGE PLAN: Attend aftercare/continuing care group Outpatient therapy  PATIENT/FAMILY INVOLVEMENT: This treatment plan has been presented to and reviewed with the patient, [name].  The patient and family have been given the opportunity to ask questions and make suggestions.  Providence Crosby, RN 09/02/2016, 3:52 AM

## 2016-09-02 NOTE — Progress Notes (Signed)
Patient ID: [name], female   DOB: 12-14-1966, 50 y.o.   MRN: [id number]  Admission Note:  D:50 yr female who presents VC in no acute distress for the treatment of SI and Depression. Pt appears flat and depressed. Pt was calm and cooperative with admission process. Pt presents with passive SI and contracts for safety upon admission. Pt denies AVH . Pt stated she had been feeling bad x 2 days so she needed to come to the hospital. Pt expresses recent break up with BF, argument with female friend, money , and her daughter leaving to go to school ([employer]) causing pt to feel depressed and stressed. Pt stated she was feeling very lonely.    A:Skin was assessed and found to be clear of any abnormal marks per female nurse PT searched and no contraband found, POC and unit policies explained and understanding verbalized. Consents obtained. Food and fluids offered, and  Accepted.  R: Pt had no additional questions or concerns.
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
## 2016-09-02 NOTE — Progress Notes (Signed)
D: Patient HI and A/V hallucinations; patient has on and off thoughts of SI; patient reports sleep to be was poor; patient slept most of the day; reports appetite is poor; reports energy level is low ; reports ability to concentrate is poor; rates depression as 10/10; rates hopelessness 10/10; rates anxiety as 8/10;   A: Monitored q 15 minutes; patient encouraged to attend groups; patient educated about medications; patient given medications per physician orders; patient encouraged to express feelings and/or concerns  R: Patient is flat and sad; patient has slept most of the day ; patient's interaction with staff and peers is isolative; patient was able to set goal to talk with staff 1:1 when having feelings of SI; patient is taking medications as prescribed and tolerating medications

## 2016-09-03 DIAGNOSIS — F332 Major depressive disorder, recurrent severe without psychotic features: Principal | ICD-10-CM

## 2016-09-03 LAB — GLUCOSE, CAPILLARY: GLUCOSE-CAPILLARY: 112 mg/dL — AB (ref 65–99)

## 2016-09-03 NOTE — Tx Team (Signed)
Interdisciplinary Treatment and Diagnostic Plan Update  09/03/2016 Time of Session: 8:29 AM  [name] MRN: [id number]  Principal Diagnosis: MDD (major depressive disorder), recurrent severe, without psychosis ([place])  Secondary Diagnoses: Principal Problem:   MDD (major depressive disorder), recurrent severe, without psychosis ([place]) Active Problems:   Panic disorder   Borderline personality disorder   Diabetes mellitus ([place])   Current Medications:  Current Facility-Administered Medications  Medication Dose Route Frequency Provider Last Rate Last Dose  . acetaminophen (TYLENOL) tablet 650 mg  650 mg Oral Q6H PRN Lurena Nida, NP      . ARIPiprazole (ABILIFY) tablet 5 mg  5 mg Oral QHS Derrill Center, NP   5 mg at 09/02/16 2118  . canagliflozin (INVOKANA) tablet 300 mg  300 mg Oral QAC breakfast Jenne Campus, MD   300 mg at 09/03/16 2637  . clonazePAM (KLONOPIN) tablet 0.5 mg  0.5 mg Oral TID PRN Ursula Alert, MD   0.5 mg at 09/02/16 2118  . estradiol (ESTRACE) tablet 2 mg  2 mg Oral Daily Lurena Nida, NP   2 mg at 09/02/16 0858  . hydrOXYzine (ATARAX/VISTARIL) tablet 25 mg  25 mg Oral QHS PRN Derrill Center, NP      . hydrOXYzine (ATARAX/VISTARIL) tablet 50 mg  50 mg Oral QHS Saramma Eappen, MD      . linagliptin (TRADJENTA) tablet 5 mg  5 mg Oral Daily Lurena Nida, NP   5 mg at 09/02/16 0858  . lisinopril (PRINIVIL,ZESTRIL) tablet 20 mg  20 mg Oral BH-q7a Lurena Nida, NP   20 mg at 09/03/16 8588  . medroxyPROGESTERone (PROVERA) tablet 2.5 mg  2.5 mg Oral Daily Lurena Nida, NP   2.5 mg at 09/02/16 0858  . metFORMIN (GLUCOPHAGE-XR) 24 hr tablet 500 mg  500 mg Oral BID WC Jenne Campus, MD   500 mg at 09/02/16 1709  . metoprolol tartrate (LOPRESSOR) tablet 25 mg  25 mg Oral BID Lurena Nida, NP   25 mg at 09/02/16 1709  . pravastatin (PRAVACHOL) tablet 40 mg  40 mg Oral Daily Lurena Nida, NP   40 mg at 09/02/16 0858  . venlafaxine XR (EFFEXOR-XR) 24 hr capsule 75  mg  75 mg Oral Q breakfast Lurena Nida, NP   75 mg at 09/02/16 0858  . Vilazodone HCl (VIIBRYD) TABS 40 mg  40 mg Oral Daily Lurena Nida, NP   40 mg at 09/02/16 5027    PTA Medications: Prescriptions Prior to Admission  Medication Sig Dispense Refill Last Dose  . ARIPiprazole (ABILIFY) 2 MG tablet Take 1 tablet (2 mg total) by mouth at bedtime. 30 tablet 2 09/01/2016 at Unknown time  . clonazePAM (KLONOPIN) 0.5 MG tablet Take 1 tablet (0.5 mg total) by mouth 3 (three) times daily. 90 tablet 2 09/02/2016 at Unknown time  . estradiol (ESTRACE) 2 MG tablet Take 1 tablet (2 mg total) by mouth daily. 30 tablet 0 09/01/2016 at Unknown time  . INVOKAMET XR 150-500 MG TB24 Take 1 tablet by mouth 2 (two) times daily.    09/01/2016 at Unknown time  . linagliptin (TRADJENTA) 5 MG TABS tablet Take 1 tablet (5 mg total) by mouth daily. 30 tablet 0 09/01/2016 at Unknown time  . lisinopril (PRINIVIL,ZESTRIL) 20 MG tablet Take 1 tablet (20 mg total) by mouth every morning. 30 tablet 0 09/01/2016 at Unknown time  . Magnesium 250 MG TABS Take 250 mg by mouth  daily.   09/01/2016 at Unknown time  . medroxyPROGESTERone (PROVERA) 2.5 MG tablet Take 1 tablet (2.5 mg total) by mouth daily. 30 tablet 0 09/01/2016 at Unknown time  . metoprolol tartrate (LOPRESSOR) 25 MG tablet Take 1 tablet (25 mg total) by mouth 2 (two) times daily. 60 tablet 0 09/01/2016 at Unknown time  . pravastatin (PRAVACHOL) 40 MG tablet Take 1 tablet (40 mg total) by mouth daily. 30 tablet 0 09/01/2016 at Unknown time  . venlafaxine XR (EFFEXOR-XR) 75 MG 24 hr capsule Take 1 capsule (75 mg total) by mouth daily with breakfast. 30 capsule 2 09/01/2016 at Unknown time  . Vilazodone HCl (VIIBRYD) 40 MG TABS Take 1 tablet (40 mg total) by mouth daily. 30 tablet 2 09/01/2016 at Unknown time    Treatment Modalities: Medication Management, Group therapy, Case management,  1 to 1 session with clinician, Psychoeducation, Recreational therapy.   Physician Treatment Plan  for Primary Diagnosis: MDD (major depressive disorder), recurrent severe, without psychosis ([place]) Long Term Goal(s): Improvement in symptoms so as ready for discharge  Short Term Goals: Ability to identify changes in lifestyle to reduce recurrence of condition will improve, Ability to verbalize feelings will improve, Ability to maintain clinical measurements within normal limits will improve and Compliance with prescribed medications will improve  Medication Management: Evaluate patient's response, side effects, and tolerance of medication regimen.  Therapeutic Interventions: 1 to 1 sessions, Unit Group sessions and Medication administration.  Evaluation of Outcomes: Not Met  Physician Treatment Plan for Secondary Diagnosis: Principal Problem:   MDD (major depressive disorder), recurrent severe, without psychosis ([place]) Active Problems:   Panic disorder   Borderline personality disorder   Diabetes mellitus ([place])   Long Term Goal(s): Improvement in symptoms so as ready for discharge  Short Term Goals: Ability to identify changes in lifestyle to reduce recurrence of condition will improve, Ability to verbalize feelings will improve, Ability to maintain clinical measurements within normal limits will improve and Compliance with prescribed medications will improve  Medication Management: Evaluate patient's response, side effects, and tolerance of medication regimen.  Therapeutic Interventions: 1 to 1 sessions, Unit Group sessions and Medication administration.  Evaluation of Outcomes: Not Met   RN Treatment Plan for Primary Diagnosis: MDD (major depressive disorder), recurrent severe, without psychosis ([place]) Long Term Goal(s): Knowledge of disease and therapeutic regimen to maintain health will improve  Short Term Goals: Ability to verbalize feelings will improve, Ability to disclose and discuss suicidal ideas and Ability to identify and develop effective coping behaviors will  improve  Medication Management: RN will administer medications as ordered by provider, will assess and evaluate patient's response and provide education to patient for prescribed medication. RN will report any adverse and/or side effects to prescribing provider.  Therapeutic Interventions: 1 on 1 counseling sessions, Psychoeducation, Medication administration, Evaluate responses to treatment, Monitor vital signs and CBGs as ordered, Perform/monitor CIWA, COWS, AIMS and Fall Risk screenings as ordered, Perform wound care treatments as ordered.  Evaluation of Outcomes: Not Met   LCSW Treatment Plan for Primary Diagnosis: MDD (major depressive disorder), recurrent severe, without psychosis ([place]) Long Term Goal(s): Safe transition to appropriate next level of care at discharge, Engage patient in therapeutic group addressing interpersonal concerns.  Short Term Goals: Engage patient in aftercare planning with referrals and resources, Increase emotional regulation and Increase skills for wellness and recovery  Therapeutic Interventions: Assess for all discharge needs, 1 to 1 time with Social worker, Explore available resources and support systems, Assess for adequacy  in community support network, Educate family and significant other(s) on suicide prevention, Complete Psychosocial Assessment, Interpersonal group therapy.  Evaluation of Outcomes: Not Met   Progress in Treatment: Attending groups: Pt is new to milieu, continuing to assess  Participating in groups: Pt is new to milieu, continuing to assess  Taking medication as prescribed: Yes, MD continues to assess for medication changes as needed Toleration medication: Yes, no side effects reported at this time Family/Significant other contact made: No, CSW assessing for appropriate contact Patient understands diagnosis: Continuing to assess Discussing patient identified problems/goals with staff: Yes Medical problems stabilized or resolved:  Yes Denies suicidal/homicidal ideation: Yes Issues/concerns per patient self-inventory: None Other: N/A  New problem(s) identified: None identified at this time.   New Short Term/Long Term Goal(s): None identified at this time.   Discharge Plan or Barriers: Pt will return home and follow-up with outpatient services.   Reason for Continuation of Hospitalization: Anxiety Depression Medication stabilization Suicidal ideation  Estimated Length of Stay: 1-2 days  Attendees: Patient: 09/03/2016  8:29 AM  Physician: Dr. Parke Poisson 09/03/2016  8:29 AM  Nursing: Eulogio Bear, Micheline Chapman., RN 09/03/2016  8:29 AM  RN Care Manager: Lars Pinks, RN 09/03/2016  8:29 AM  Social Worker: Peri Maris, LCSW; Kristin Drinkard, LCSW 09/03/2016  8:29 AM  Recreational Therapist:  09/03/2016  8:29 AM  Other: Lindell Spar, NP; Samuel Jester, NP 09/03/2016  8:29 AM  Other:  09/03/2016  8:29 AM  Other: 09/03/2016  8:29 AM    Scribe for Treatment Team: Bo Mcclintock, LCSW 09/03/2016 8:29 AM

## 2016-09-03 NOTE — BHH Group Notes (Signed)
[place] LCSW Group Therapy 09/03/2016 1:15 PM  Type of Therapy: Group Therapy- Feelings about Diagnosis  Participation Level: Active   Participation Quality:  Appropriate  Affect:  Appropriate  Cognitive: Alert and Oriented   Insight:  Developing   Engagement in Therapy: Developing/Improving and Engaged   Modes of Intervention: Clarification, Confrontation, Discussion, Education, Exploration, Limit-setting, Orientation, Problem-solving, Rapport Building, Art therapist, Socialization and Support  Description of Group:   This group will allow patients to explore their thoughts and feelings about diagnoses they have received. Patients will be guided to explore their level of understanding and acceptance of these diagnoses. Facilitator will encourage patients to process their thoughts and feelings about the reactions of others to their diagnosis, and will guide patients in identifying ways to discuss their diagnosis with significant others in their lives. This group will be process-oriented, with patients participating in exploration of their own experiences as well as giving and receiving support and challenge from other group members.  Summary of Progress/Problems:  Pt participated appropriately in group discussion and was able to identify specific symptoms of her diagnosis as well as the way stigma effects her. Pt specifically described how difficult it is for her to have family who invalidate and misunderstand her illness.  Therapeutic Modalities:   Cognitive Behavioral Therapy Solution Focused Therapy Motivational Interviewing Relapse Prevention Therapy  Peri Maris, [place] 09/03/2016 3:34 PM

## 2016-09-03 NOTE — Progress Notes (Addendum)
DAR NOTE.: Pt present with bright affect and jovial mood. Pt has been visible in the milieu, attended and participated in group meeting. Pt sated sh is feeling better and ready for discharge. Pt denies physical pain, as per self inventory, pt had a good night sleep, good appetite, normal energy level and good concentration. Pt rates depression at 6, hopelessness at 5, and anxiety at 5. Pt states her for today is "going home." Pt's safety ensured with 15 minute and environmental checks. Pt currently denies SI/HI and A/V hallucinations. Pt verbally agrees to seek staff if SI/HI or A/VH occurs and to consult with staff before acting on any harmful thoughts. Will continue POC.

## 2016-09-03 NOTE — Progress Notes (Signed)
[name] had been up and visible in milieu this evening, did appear depressed and withdrawn while this evening and spoke about on-going anxiety and depression. [name] spoke about having difficulties with sleep and was offered and educated about vistaril but chose not to take and opted instead for a klonopin. [name] did not verbalize any complaints of pain this evening. A. Support and encouragement provided. R. Safety maintained, will continue to monitor.

## 2016-09-03 NOTE — Progress Notes (Signed)
Adult Psychoeducational Group Note  Date:  09/03/2016 Time:  9:08 PM  Group Topic/Focus:  Wrap-Up Group:   The focus of this group is to help patients review their daily goal of treatment and discuss progress on daily workbooks.   Participation Level:  Active  Participation Quality:  Appropriate  Affect:  Appropriate  Cognitive:  Appropriate  Insight: Appropriate  Engagement in Group:  Engaged  Modes of Intervention:  Discussion  Additional Comments:  Patient attended wrap-up group and she said her day was a 10/10.  Her goal for today was to talk to the physician and she did.  She hope to leave tomorrow.  Coping skills are music and talking with others. Malosi Hemstreet W Rettie Laird [phone], 9:08 PM
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
## 2016-09-03 NOTE — BHH Suicide Risk Assessment (Signed)
Banning INPATIENT:  Family/Significant Other Suicide Prevention Education  Suicide Prevention Education:  Patient Refusal for Family/Significant Other Suicide Prevention Education: The patient [name] has refused to provide written consent for family/significant other to be provided Family/Significant Other Suicide Prevention Education during admission and/or prior to discharge.  Physician notified.  Bo Mcclintock 09/03/2016, 5:06 PM

## 2016-09-03 NOTE — Progress Notes (Signed)
Adult Psychoeducational Group Note  Date:  09/03/2016 Time:  10:00 AM  Group Topic/Focus:  Recovery Goals:   The focus of this group is to identify appropriate goals for recovery and establish a plan to achieve them.   Participation Level:  Active  Participation Quality:  Appropriate  Affect:  Appropriate  Cognitive:  Appropriate  Insight: Appropriate  Engagement in Group:  Engaged  Modes of Intervention:  Discussion  Additional Comments:  Pt goal is to be discharged today. [name] 09/03/2016, 10:00 AM

## 2016-09-03 NOTE — Progress Notes (Signed)
Recreation Therapy Notes  Animal-Assisted Activity (AAA) Program Checklist/Progress Notes Patient Eligibility Criteria Checklist & Daily Group note for Rec TxIntervention  Date: 09.05.2017 Time: 2:45pm Location: [address]    AAA/T Program Assumption of Risk Form signed by Patient/ or Parent Legal Guardian Yes  Patient is free of allergies or sever asthma Yes  Patient reports no fear of animals Yes  Patient reports no history of cruelty to animals Yes  Patient understands his/her participation is voluntary Yes  Patient washes hands before animal contact Yes  Patient washes hands after animal contact Yes  Behavioral Response: Engaged, Attentive   Education:Hand Washing, Appropriate Animal Interaction   Education Outcome: Acknowledges education.   Clinical Observations/Feedback: Patient attended session and interacted appropriately with therapy dog and peers. Patient asked appropriate questions about therapy dog and his training.   Laureen Ochs Simar Pothier, LRT/CTRS  Arbadella Kimbler L 09/03/2016 3:05 PM

## 2016-09-03 NOTE — Progress Notes (Signed)
Patient ID: [name], female   DOB: September 10, 1966, 50 y.o.   MRN: [id number] PER STATE REGULATIONS 482.30  THIS CHART WAS REVIEWED FOR MEDICAL NECESSITY WITH RESPECT TO THE PATIENT'S ADMISSION/DURATION OF STAY.  NEXT REVIEW DATE:09/06/16  Roma Schanz, RN, BSN CASE MANAGER
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
## 2016-09-04 LAB — GLUCOSE, CAPILLARY: Glucose-Capillary: 126 mg/dL — ABNORMAL HIGH (ref 65–99)

## 2016-09-04 MED ORDER — LINAGLIPTIN 5 MG PO TABS: 5.0000 mg | ORAL_TABLET | Freq: Every day | ORAL | 0 refills | Status: DC

## 2016-09-04 MED ORDER — PRAVASTATIN SODIUM 40 MG PO TABS: 40.0000 mg | ORAL_TABLET | Freq: Every day | ORAL | 0 refills | Status: DC

## 2016-09-04 MED ORDER — HYDROXYZINE HCL 25 MG PO TABS: 25.0000 mg | ORAL_TABLET | Freq: Every evening | ORAL | 0 refills | Status: DC | PRN

## 2016-09-04 MED ORDER — METOPROLOL TARTRATE 25 MG PO TABS: 25.0000 mg | ORAL_TABLET | Freq: Two times a day (BID) | ORAL | 0 refills | Status: DC

## 2016-09-04 MED ORDER — ARIPIPRAZOLE 5 MG PO TABS: 5.0000 mg | ORAL_TABLET | Freq: Every day | ORAL | 0 refills | Status: DC

## 2016-09-04 MED ORDER — ESTRADIOL 2 MG PO TABS: 2.0000 mg | ORAL_TABLET | Freq: Every day | ORAL | 0 refills | Status: DC

## 2016-09-04 MED ORDER — VILAZODONE HCL 40 MG PO TABS: 40.0000 mg | ORAL_TABLET | Freq: Every day | ORAL | 2 refills | Status: DC

## 2016-09-04 MED ORDER — CANAGLIFLOZIN 300 MG PO TABS: 300.0000 mg | ORAL_TABLET | Freq: Every day | ORAL | 0 refills | Status: DC

## 2016-09-04 MED ORDER — VENLAFAXINE HCL ER 75 MG PO CP24: 75.0000 mg | ORAL_CAPSULE | Freq: Every day | ORAL | 0 refills | Status: DC

## 2016-09-04 MED ORDER — MEDROXYPROGESTERONE ACETATE 2.5 MG PO TABS: 2.5000 mg | ORAL_TABLET | Freq: Every day | ORAL | 0 refills | Status: DC

## 2016-09-04 MED ORDER — METFORMIN HCL ER 500 MG PO TB24: 500.0000 mg | ORAL_TABLET | Freq: Two times a day (BID) | ORAL | 0 refills | Status: DC

## 2016-09-04 MED ORDER — LISINOPRIL 20 MG PO TABS: 20.0000 mg | ORAL_TABLET | ORAL | 0 refills | Status: DC

## 2016-09-04 NOTE — Progress Notes (Signed)
Recreation Therapy Notes  Date: 09/04/16 Time: 0930 Location: [address] Group Room  Group Topic: Stress Management  Goal Area(s) Addresses:  Patient will verbalize importance of using healthy stress management.  Patient will identify positive emotions associated with healthy stress management.   Behavioral Response: Engaged  Intervention: Stress Management  Activity :  Progressive Muscle Relaxation.  LRT introduced the technique of progressive muscle relaxation to patients.  LRT read script so patients to engage in technique.  Patients were to sit in a comfortable position and follow along as LRT read script.  Education:  Stress Management, Discharge Planning.   Education Outcome: Acknowledges edcuation/In group clarification offered/Needs additional education  Clinical Observations/Feedback: Pt attended group.    Victorino Sparrow, LRT/CTRS         Victorino Sparrow A 09/04/2016 12:18 PM
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
## 2016-09-04 NOTE — Progress Notes (Signed)
D: Pt was in the day room upon initial approach.  Pt has anxious affect and mood.  She descibes her day as "really good, a 10 out of 10."  Pt states she "went to all the groups, pet therapy, went outside, talked to my family and my friends."  She reports she had a good visit with her friend tonight.  Pt denies SI/HI, denies hallucinations, denies pain.  Pt has been visible in milieu interacting with peers and staff appropriately.  Pt attended evening group.   A: Introduced self to pt.  Met with pt and offered support and encouragement.  Actively listened to pt.  Medications offered per order.  PRN medication administered for anxiety. R: Pt refused scheduled Vistaril.  Pt verbally contracts for safety.  Will continue to monitor and assess.

## 2016-09-04 NOTE — Progress Notes (Signed)
Pt discharged home with family member. Pt was ambulatory, stable and appreciative at that time. All papers and prescriptions were given and valuables returned. Verbal understanding expressed. Denies SI/HI and A/VH. Pt given opportunity to express concerns and ask questions.

## 2016-09-04 NOTE — Plan of Care (Signed)
Problem: Activity: Goal: Sleeping patterns will improve Outcome: Progressing Pt slept 6.75 hours last night according to flowsheet.    

## 2016-09-04 NOTE — Tx Team (Signed)
Interdisciplinary Treatment and Diagnostic Plan Update  09/04/2016 Time of Session: 9:17 AM  [name] MRN: [id number]  Principal Diagnosis: MDD (major depressive disorder), recurrent severe, without psychosis ([place])  Secondary Diagnoses: Principal Problem:   MDD (major depressive disorder), recurrent severe, without psychosis ([place]) Active Problems:   Panic disorder   Borderline personality disorder   Diabetes mellitus ([place])   Current Medications:  Current Facility-Administered Medications  Medication Dose Route Frequency Provider Last Rate Last Dose  . acetaminophen (TYLENOL) tablet 650 mg  650 mg Oral Q6H PRN Lurena Nida, NP      . ARIPiprazole (ABILIFY) tablet 5 mg  5 mg Oral QHS Derrill Center, NP   5 mg at 09/03/16 2107  . canagliflozin (INVOKANA) tablet 300 mg  300 mg Oral QAC breakfast Jenne Campus, MD   300 mg at 09/04/16 [id number]  . clonazePAM (KLONOPIN) tablet 0.5 mg  0.5 mg Oral TID PRN Ursula Alert, MD   0.5 mg at 09/04/16 0748  . estradiol (ESTRACE) tablet 2 mg  2 mg Oral Daily Lurena Nida, NP   2 mg at 09/04/16 0745  . hydrOXYzine (ATARAX/VISTARIL) tablet 25 mg  25 mg Oral QHS PRN Derrill Center, NP      . hydrOXYzine (ATARAX/VISTARIL) tablet 50 mg  50 mg Oral QHS Saramma Eappen, MD      . linagliptin (TRADJENTA) tablet 5 mg  5 mg Oral Daily Lurena Nida, NP   5 mg at 09/04/16 0745  . lisinopril (PRINIVIL,ZESTRIL) tablet 20 mg  20 mg Oral BH-q7a Lurena Nida, NP   20 mg at 09/04/16 [id number]  . medroxyPROGESTERone (PROVERA) tablet 2.5 mg  2.5 mg Oral Daily Lurena Nida, NP   2.5 mg at 09/04/16 0745  . metFORMIN (GLUCOPHAGE-XR) 24 hr tablet 500 mg  500 mg Oral BID WC Jenne Campus, MD   500 mg at 09/04/16 0746  . metoprolol tartrate (LOPRESSOR) tablet 25 mg  25 mg Oral BID Lurena Nida, NP   25 mg at 09/04/16 0745  . pravastatin (PRAVACHOL) tablet 40 mg  40 mg Oral Daily Lurena Nida, NP   40 mg at 09/04/16 0746  . venlafaxine XR (EFFEXOR-XR) 24 hr capsule 75  mg  75 mg Oral Q breakfast Lurena Nida, NP   75 mg at 09/04/16 0748  . Vilazodone HCl (VIIBRYD) TABS 40 mg  40 mg Oral Daily Lurena Nida, NP   40 mg at 09/04/16 0745    PTA Medications: Prescriptions Prior to Admission  Medication Sig Dispense Refill Last Dose  . ARIPiprazole (ABILIFY) 2 MG tablet Take 1 tablet (2 mg total) by mouth at bedtime. 30 tablet 2 09/01/2016 at Unknown time  . clonazePAM (KLONOPIN) 0.5 MG tablet Take 1 tablet (0.5 mg total) by mouth 3 (three) times daily. 90 tablet 2 09/02/2016 at Unknown time  . estradiol (ESTRACE) 2 MG tablet Take 1 tablet (2 mg total) by mouth daily. 30 tablet 0 09/01/2016 at Unknown time  . INVOKAMET XR 150-500 MG TB24 Take 1 tablet by mouth 2 (two) times daily.    09/01/2016 at Unknown time  . linagliptin (TRADJENTA) 5 MG TABS tablet Take 1 tablet (5 mg total) by mouth daily. 30 tablet 0 09/01/2016 at Unknown time  . lisinopril (PRINIVIL,ZESTRIL) 20 MG tablet Take 1 tablet (20 mg total) by mouth every morning. 30 tablet 0 09/01/2016 at Unknown time  . Magnesium 250 MG TABS Take 250 mg by mouth  daily.   09/01/2016 at Unknown time  . medroxyPROGESTERone (PROVERA) 2.5 MG tablet Take 1 tablet (2.5 mg total) by mouth daily. 30 tablet 0 09/01/2016 at Unknown time  . metoprolol tartrate (LOPRESSOR) 25 MG tablet Take 1 tablet (25 mg total) by mouth 2 (two) times daily. 60 tablet 0 09/01/2016 at Unknown time  . pravastatin (PRAVACHOL) 40 MG tablet Take 1 tablet (40 mg total) by mouth daily. 30 tablet 0 09/01/2016 at Unknown time  . venlafaxine XR (EFFEXOR-XR) 75 MG 24 hr capsule Take 1 capsule (75 mg total) by mouth daily with breakfast. 30 capsule 2 09/01/2016 at Unknown time  . Vilazodone HCl (VIIBRYD) 40 MG TABS Take 1 tablet (40 mg total) by mouth daily. 30 tablet 2 09/01/2016 at Unknown time    Treatment Modalities: Medication Management, Group therapy, Case management,  1 to 1 session with clinician, Psychoeducation, Recreational therapy.   Physician Treatment Plan  for Primary Diagnosis: MDD (major depressive disorder), recurrent severe, without psychosis ([place]) Long Term Goal(s): Improvement in symptoms so as ready for discharge  Short Term Goals: Ability to identify changes in lifestyle to reduce recurrence of condition will improve, Ability to verbalize feelings will improve, Ability to maintain clinical measurements within normal limits will improve and Compliance with prescribed medications will improve  Medication Management: Evaluate patient's response, side effects, and tolerance of medication regimen.  Therapeutic Interventions: 1 to 1 sessions, Unit Group sessions and Medication administration.  Evaluation of Outcomes: Adequate for Discharge  Physician Treatment Plan for Secondary Diagnosis: Principal Problem:   MDD (major depressive disorder), recurrent severe, without psychosis ([place]) Active Problems:   Panic disorder   Borderline personality disorder   Diabetes mellitus ([place])   Long Term Goal(s): Improvement in symptoms so as ready for discharge  Short Term Goals: Ability to identify changes in lifestyle to reduce recurrence of condition will improve, Ability to verbalize feelings will improve, Ability to maintain clinical measurements within normal limits will improve and Compliance with prescribed medications will improve  Medication Management: Evaluate patient's response, side effects, and tolerance of medication regimen.  Therapeutic Interventions: 1 to 1 sessions, Unit Group sessions and Medication administration.  Evaluation of Outcomes: Adequate for Discharge   RN Treatment Plan for Primary Diagnosis: MDD (major depressive disorder), recurrent severe, without psychosis ([place]) Long Term Goal(s): Knowledge of disease and therapeutic regimen to maintain health will improve  Short Term Goals: Ability to verbalize feelings will improve, Ability to disclose and discuss suicidal ideas and Ability to identify and develop effective  coping behaviors will improve  Medication Management: RN will administer medications as ordered by provider, will assess and evaluate patient's response and provide education to patient for prescribed medication. RN will report any adverse and/or side effects to prescribing provider.  Therapeutic Interventions: 1 on 1 counseling sessions, Psychoeducation, Medication administration, Evaluate responses to treatment, Monitor vital signs and CBGs as ordered, Perform/monitor CIWA, COWS, AIMS and Fall Risk screenings as ordered, Perform wound care treatments as ordered.  Evaluation of Outcomes: Adequate for Discharge   LCSW Treatment Plan for Primary Diagnosis: MDD (major depressive disorder), recurrent severe, without psychosis ([place]) Long Term Goal(s): Safe transition to appropriate next level of care at discharge, Engage patient in therapeutic group addressing interpersonal concerns.  Short Term Goals: Engage patient in aftercare planning with referrals and resources, Increase emotional regulation and Increase skills for wellness and recovery  Therapeutic Interventions: Assess for all discharge needs, 1 to 1 time with Social worker, Explore available resources and support systems,  Assess for adequacy in community support network, Educate family and significant other(s) on suicide prevention, Complete Psychosocial Assessment, Interpersonal group therapy.  Evaluation of Outcomes: Adequate for Discharge   Progress in Treatment: Attending groups: Yes Participating in groups:Yes Taking medication as prescribed: Yes, MD continues to assess for medication changes as needed Toleration medication: Yes, no side effects reported at this time Family/Significant other contact made: No, Pt declines Patient understands diagnosis: Continuing to assess Discussing patient identified problems/goals with staff: Yes Medical problems stabilized or resolved: Yes Denies suicidal/homicidal ideation:  Yes Issues/concerns per patient self-inventory: None Other: N/A  New problem(s) identified: None identified at this time.   New Short Term/Long Term Goal(s): None identified at this time.   Discharge Plan or Barriers: Pt will return home and follow-up with outpatient services.   Reason for Continuation of Hospitalization: Anxiety Depression Medication stabilization Suicidal ideation  Estimated Length of Stay: 0 days  Attendees: Patient: 09/04/2016  9:17 AM  Physician: Dr. Parke Poisson 09/04/2016  9:17 AM  Nursing: Eulogio Bear, Jules Husbands, RN 09/04/2016  9:17 AM  RN Care Manager: Lars Pinks, RN 09/04/2016  9:17 AM  Social Worker: Peri Maris, LCSW; Erasmo Downer Drinkard, LCSW 09/04/2016  9:17 AM  Recreational Therapist:  09/04/2016  9:17 AM  Other: Lindell Spar, NP; Samuel Jester, NP 09/04/2016  9:17 AM  Other:  09/04/2016  9:17 AM  Other: 09/04/2016  9:17 AM    Scribe for Treatment Team: Bo Mcclintock, LCSW 09/04/2016 9:17 AM

## 2016-09-04 NOTE — BHH Group Notes (Signed)
Pt attended spiritual care group on grief and loss facilitated by chaplain Yu Peggs   Group opened with brief discussion and psycho-social ed around grief and loss in relationships and in relation to self - identifying life patterns, circumstances, changes that cause losses. Established group norm of speaking from own life experience. Group goal of establishing open and affirming space for members to share loss and experience with grief, normalize grief experience and provide psycho social education and grief support.     

## 2016-09-04 NOTE — Progress Notes (Signed)
  [facility] Adult Case Management Discharge Plan :  Will you be returning to the same living situation after discharge:  Yes,  Pt returning home At discharge, do you have transportation home?: Yes,  Pt friend to pick up Do you have the ability to pay for your medications: Yes,  Pt provided with prescriptions  Release of information consent forms completed and in the chart;  Patient's signature needed at discharge.  Patient to Follow up at: Follow-up Information    [facility] .   Specialty:  Behavioral Health Why:  9/19 at 7:45am with Maurice Small for therapy. 10/2 at 8:30am with Dr. Harrington Challenger for medication management.  Contact information: [address] [place] [phone]          Next level of care provider has access to [place] and Suicide Prevention discussed: Yes,  with Pt; declines family contact  Have you used any form of tobacco in the last 30 days? (Cigarettes, Smokeless Tobacco, Cigars, and/or Pipes): No  Has patient been referred to the Quitline?: N/A patient is not a smoker  Patient has been referred for addiction treatment: N/A  Bo Mcclintock 09/04/2016, 9:21 AM
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
## 2016-09-16 ENCOUNTER — Other Ambulatory Visit: Payer: Self-pay | Admitting: Obstetrics & Gynecology

## 2016-09-17 ENCOUNTER — Ambulatory Visit (INDEPENDENT_AMBULATORY_CARE_PROVIDER_SITE_OTHER): Payer: Medicare Other | Admitting: Psychiatry

## 2016-09-17 ENCOUNTER — Encounter (HOSPITAL_COMMUNITY): Payer: Self-pay | Admitting: Psychiatry

## 2016-09-17 DIAGNOSIS — F332 Major depressive disorder, recurrent severe without psychotic features: Secondary | ICD-10-CM

## 2016-09-17 NOTE — Progress Notes (Signed)
   THERAPIST PROGRESS NOTE  Session Time:  Tuesday 09/17/2016 8:06 AM -  8:58 AM  Participation Level: Active  Behavioral Response: less depressed  Type of Therapy: Individual Therapy   Treatment Goals :     1 .Identify replace thoughts that support depression        2. 1ncrease self acceptance decreasing the negative statements about self and increasing positive statements about self       3. Improve ability to manage stress and anxiety with decreased intensity and frequency as of anxiety response (panic attacks, avoidance)       4. Improve interpersonal skills  Treatment Goals addressed  1,4   Interventions: CBT and Supportive,ACT  Summary: [name] is a 50 y.o. female who presents with a long-standing history of chronic recurrent severe depressive symptoms accompanied by chronic anxiety. She has had multiple hospitalizations due to to suicidal attempts and depression. Her current symptoms include depressed mood, anxiety, excessive worrying, panic attacks, and passive suicidal ideations.   Patient last was seen 3 weeks ago. She  reports becoming severely depressed and having suicidal ideations during the Labor Day weekend. This was triggered by patient having increased thoughts about deceased sister. She reports she thought about taking pills but calling her family. She was seen at [facility] and then hospitalized at [facility] in [place]. She was discharged after three days. Patient reports participating in group about loss while she was there and said this was very helpful. She denies any suicidal ideations and reports minimal depression since discharge. She has tried to resume involvement in activities.   Suicidal/Homicidal: No. Patient agrees to call this practice, call 911, or have someone take her to the emergency room should symptoms worsen.   Therapist Response:  Reviewed symptoms, administered pH Q depression screen, discussed patient's recent hospitalization, assisted  patient identify triggers of recent episode, praised her use of her support system, assisted patient identify strategies to resist suicidal thoughts and feelings, assisted patient identify the effects suicide can have on family members, assisted patient identify ways to express concerns and ask help from family members when needed, discuss grief and loss issues regarding deceased sister  .Plan: Patient agrees to return for an appointment in 2 weeks. Patient agrees to review handout provided in session, develop and implement plan to engage in activities during the day when daughter is away.    Diagnosis: Axis I: MDD, Recurrent    Axis II: Borderline Personality Dis.    BYNUM,PEGGY, LCSW

## 2016-09-30 ENCOUNTER — Encounter (HOSPITAL_COMMUNITY): Payer: Self-pay | Admitting: Psychiatry

## 2016-09-30 ENCOUNTER — Ambulatory Visit (INDEPENDENT_AMBULATORY_CARE_PROVIDER_SITE_OTHER): Payer: Medicare Other | Admitting: Psychiatry

## 2016-09-30 VITALS — BP 131/87 | HR 85 | Ht 64.5 in | Wt 207.0 lb

## 2016-09-30 DIAGNOSIS — F332 Major depressive disorder, recurrent severe without psychotic features: Secondary | ICD-10-CM | POA: Diagnosis not present

## 2016-09-30 MED ORDER — VILAZODONE HCL 40 MG PO TABS: 40.0000 mg | ORAL_TABLET | Freq: Every day | ORAL | 2 refills | Status: DC

## 2016-09-30 MED ORDER — CLONAZEPAM 0.5 MG PO TABS: 0.5000 mg | ORAL_TABLET | Freq: Three times a day (TID) | ORAL | 0 refills | Status: DC

## 2016-09-30 MED ORDER — VENLAFAXINE HCL ER 75 MG PO CP24: 75.0000 mg | ORAL_CAPSULE | Freq: Every day | ORAL | 2 refills | Status: DC

## 2016-09-30 MED ORDER — ARIPIPRAZOLE 2 MG PO TABS: 2.0000 mg | ORAL_TABLET | Freq: Every day | ORAL | 2 refills | Status: DC

## 2016-09-30 NOTE — Patient Instructions (Signed)
Stop all alcohol use
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
## 2016-10-03 ENCOUNTER — Ambulatory Visit (HOSPITAL_COMMUNITY): Payer: Self-pay | Admitting: Psychiatry

## 2016-10-04 ENCOUNTER — Emergency Department (HOSPITAL_COMMUNITY): Payer: No Typology Code available for payment source

## 2016-10-04 ENCOUNTER — Encounter (HOSPITAL_COMMUNITY): Payer: Self-pay

## 2016-10-04 ENCOUNTER — Emergency Department (HOSPITAL_COMMUNITY)
Admission: EM | Admit: 2016-10-04 | Discharge: 2016-10-05 | Disposition: A | Discharge status: Home / Self Care | Payer: No Typology Code available for payment source | Attending: Emergency Medicine | Admitting: Emergency Medicine

## 2016-10-04 DIAGNOSIS — F329 Major depressive disorder, single episode, unspecified: Secondary | ICD-10-CM | POA: Insufficient documentation

## 2016-10-04 DIAGNOSIS — M549 Dorsalgia, unspecified: Secondary | ICD-10-CM | POA: Insufficient documentation

## 2016-10-04 DIAGNOSIS — R51 Headache: Secondary | ICD-10-CM | POA: Insufficient documentation

## 2016-10-04 DIAGNOSIS — S3991XA Unspecified injury of abdomen, initial encounter: Secondary | ICD-10-CM | POA: Diagnosis not present

## 2016-10-04 DIAGNOSIS — F332 Major depressive disorder, recurrent severe without psychotic features: Secondary | ICD-10-CM | POA: Diagnosis present

## 2016-10-04 DIAGNOSIS — I10 Essential (primary) hypertension: Secondary | ICD-10-CM | POA: Insufficient documentation

## 2016-10-04 DIAGNOSIS — Z79899 Other long term (current) drug therapy: Secondary | ICD-10-CM | POA: Insufficient documentation

## 2016-10-04 DIAGNOSIS — S59901A Unspecified injury of right elbow, initial encounter: Secondary | ICD-10-CM | POA: Diagnosis not present

## 2016-10-04 DIAGNOSIS — Z818 Family history of other mental and behavioral disorders: Secondary | ICD-10-CM | POA: Diagnosis not present

## 2016-10-04 DIAGNOSIS — Y92410 Unspecified street and highway as the place of occurrence of the external cause: Secondary | ICD-10-CM | POA: Diagnosis not present

## 2016-10-04 DIAGNOSIS — Y999 Unspecified external cause status: Secondary | ICD-10-CM | POA: Diagnosis not present

## 2016-10-04 DIAGNOSIS — F32A Depression, unspecified: Secondary | ICD-10-CM

## 2016-10-04 DIAGNOSIS — Z813 Family history of other psychoactive substance abuse and dependence: Secondary | ICD-10-CM | POA: Diagnosis not present

## 2016-10-04 DIAGNOSIS — R109 Unspecified abdominal pain: Secondary | ICD-10-CM | POA: Insufficient documentation

## 2016-10-04 DIAGNOSIS — S0990XA Unspecified injury of head, initial encounter: Secondary | ICD-10-CM | POA: Diagnosis not present

## 2016-10-04 DIAGNOSIS — S2091XA Abrasion of unspecified parts of thorax, initial encounter: Secondary | ICD-10-CM | POA: Diagnosis not present

## 2016-10-04 DIAGNOSIS — Y9389 Activity, other specified: Secondary | ICD-10-CM | POA: Insufficient documentation

## 2016-10-04 DIAGNOSIS — E119 Type 2 diabetes mellitus without complications: Secondary | ICD-10-CM | POA: Diagnosis not present

## 2016-10-04 DIAGNOSIS — M542 Cervicalgia: Secondary | ICD-10-CM | POA: Diagnosis not present

## 2016-10-04 DIAGNOSIS — S199XXA Unspecified injury of neck, initial encounter: Secondary | ICD-10-CM | POA: Diagnosis not present

## 2016-10-04 DIAGNOSIS — Z811 Family history of alcohol abuse and dependence: Secondary | ICD-10-CM | POA: Diagnosis not present

## 2016-10-04 DIAGNOSIS — S8012XA Contusion of left lower leg, initial encounter: Secondary | ICD-10-CM | POA: Diagnosis not present

## 2016-10-04 LAB — CBC WITH DIFFERENTIAL/PLATELET
BASOS PCT: 0 %
Basophils Absolute: 0.1 10*3/uL (ref 0.0–0.1)
EOS ABS: 0.2 10*3/uL (ref 0.0–0.7)
Eosinophils Relative: 1 %
HCT: 45.1 % (ref 36.0–46.0)
HEMOGLOBIN: 14.6 g/dL (ref 12.0–15.0)
LYMPHS ABS: 2.3 10*3/uL (ref 0.7–4.0)
Lymphocytes Relative: 19 %
MCH: 26.1 pg (ref 26.0–34.0)
MCHC: 32.4 g/dL (ref 30.0–36.0)
MCV: 80.5 fL (ref 78.0–100.0)
Monocytes Absolute: 0.8 10*3/uL (ref 0.1–1.0)
Monocytes Relative: 6 %
NEUTROS ABS: 9 10*3/uL — AB (ref 1.7–7.7)
NEUTROS PCT: 74 %
Platelets: 195 10*3/uL (ref 150–400)
RBC: 5.6 MIL/uL — AB (ref 3.87–5.11)
RDW: 14 % (ref 11.5–15.5)
WBC: 12.3 10*3/uL — AB (ref 4.0–10.5)

## 2016-10-04 LAB — I-STAT CHEM 8, ED
BUN: 9 mg/dL (ref 6–20)
CALCIUM ION: 1.17 mmol/L (ref 1.15–1.40)
CHLORIDE: 104 mmol/L (ref 101–111)
Creatinine, Ser: 0.6 mg/dL (ref 0.44–1.00)
Glucose, Bld: 150 mg/dL — ABNORMAL HIGH (ref 65–99)
HEMATOCRIT: 47 % — AB (ref 36.0–46.0)
Hemoglobin: 16 g/dL — ABNORMAL HIGH (ref 12.0–15.0)
Potassium: 3.9 mmol/L (ref 3.5–5.1)
SODIUM: 140 mmol/L (ref 135–145)
TCO2: 22 mmol/L (ref 0–100)

## 2016-10-04 LAB — BASIC METABOLIC PANEL
Anion gap: 9 (ref 5–15)
BUN: 11 mg/dL (ref 6–20)
CHLORIDE: 105 mmol/L (ref 101–111)
CO2: 22 mmol/L (ref 22–32)
Calcium: 9 mg/dL (ref 8.9–10.3)
Creatinine, Ser: 0.72 mg/dL (ref 0.44–1.00)
GFR calc non Af Amer: >60 mL/min (ref >60)
Glucose, Bld: 139 mg/dL — ABNORMAL HIGH (ref 65–99)
POTASSIUM: 3.8 mmol/L (ref 3.5–5.1)
SODIUM: 136 mmol/L (ref 135–145)

## 2016-10-04 LAB — RAPID URINE DRUG SCREEN, HOSP PERFORMED
AMPHETAMINES: NOT DETECTED
BENZODIAZEPINES: POSITIVE — AB
Barbiturates: NOT DETECTED
COCAINE: NOT DETECTED
OPIATES: NOT DETECTED
Tetrahydrocannabinol: NOT DETECTED

## 2016-10-04 LAB — ETHANOL

## 2016-10-04 MED ORDER — METOPROLOL TARTRATE 25 MG PO TABS
25.0000 mg | ORAL_TABLET | Freq: Two times a day (BID) | ORAL | Status: DC
  Administered 2016-10-04 – 2016-10-05 (×2): 25 mg via ORAL
  Filled 2016-10-04 (×2): qty 1

## 2016-10-04 MED ORDER — INSULIN ASPART 100 UNIT/ML ~~LOC~~ SOLN: 0.0000 [IU] | Freq: Three times a day (TID) | SUBCUTANEOUS | Status: DC

## 2016-10-04 MED ORDER — MAGNESIUM 250 MG PO TABS: 1.0000 | ORAL_TABLET | Freq: Every day | ORAL | Status: DC

## 2016-10-04 MED ORDER — ESTRADIOL 1 MG PO TABS
2.0000 mg | ORAL_TABLET | Freq: Every day | ORAL | Status: DC
Start: 2016-10-05 — End: 2016-10-05
  Administered 2016-10-05: 2 mg via ORAL
  Filled 2016-10-04 (×3): qty 1

## 2016-10-04 MED ORDER — CANAGLIFLOZIN-METFORMIN HCL ER 150-500 MG PO TB24: 1.0000 | ORAL_TABLET | Freq: Two times a day (BID) | ORAL | Status: DC

## 2016-10-04 MED ORDER — MAGNESIUM OXIDE 400 (241.3 MG) MG PO TABS
200.0000 mg | ORAL_TABLET | Freq: Every day | ORAL | Status: DC
  Administered 2016-10-04: 200 mg via ORAL
  Filled 2016-10-04 (×3): qty 0.5

## 2016-10-04 MED ORDER — CLONAZEPAM 0.5 MG PO TABS
0.5000 mg | ORAL_TABLET | Freq: Three times a day (TID) | ORAL | Status: DC
  Administered 2016-10-04 – 2016-10-05 (×2): 0.5 mg via ORAL
  Filled 2016-10-04 (×2): qty 1

## 2016-10-04 MED ORDER — ARIPIPRAZOLE 2 MG PO TABS
2.0000 mg | ORAL_TABLET | Freq: Every day | ORAL | Status: DC
  Administered 2016-10-04: 2 mg via ORAL
  Filled 2016-10-04 (×3): qty 1

## 2016-10-04 MED ORDER — VENLAFAXINE HCL ER 37.5 MG PO CP24
75.0000 mg | ORAL_CAPSULE | Freq: Every day | ORAL | Status: DC
  Administered 2016-10-05: 75 mg via ORAL
  Filled 2016-10-04: qty 2

## 2016-10-04 MED ORDER — MEDROXYPROGESTERONE ACETATE 5 MG PO TABS
2.5000 mg | ORAL_TABLET | Freq: Every day | ORAL | Status: DC
  Administered 2016-10-05: 2.5 mg via ORAL
  Filled 2016-10-04 (×3): qty 1

## 2016-10-04 MED ORDER — IOPAMIDOL (ISOVUE-300) INJECTION 61%
100.0000 mL | Freq: Once | INTRAVENOUS | Status: AC | PRN
  Administered 2016-10-04: 100 mL via INTRAVENOUS

## 2016-10-04 MED ORDER — LISINOPRIL 10 MG PO TABS
20.0000 mg | ORAL_TABLET | ORAL | Status: DC
  Administered 2016-10-05: 20 mg via ORAL
  Filled 2016-10-04: qty 2

## 2016-10-04 MED ORDER — CANAGLIFLOZIN 100 MG PO TABS
100.0000 mg | ORAL_TABLET | Freq: Two times a day (BID) | ORAL | Status: DC
  Administered 2016-10-05: 100 mg via ORAL
  Filled 2016-10-04 (×5): qty 1

## 2016-10-04 MED ORDER — METFORMIN HCL ER 500 MG PO TB24
500.0000 mg | ORAL_TABLET | Freq: Two times a day (BID) | ORAL | Status: DC
  Administered 2016-10-05: 500 mg via ORAL
  Filled 2016-10-04 (×5): qty 1

## 2016-10-04 MED ORDER — PRAVASTATIN SODIUM 40 MG PO TABS
40.0000 mg | ORAL_TABLET | Freq: Every day | ORAL | Status: DC
Start: 2016-10-05 — End: 2016-10-05
  Administered 2016-10-05: 40 mg via ORAL
  Filled 2016-10-04 (×3): qty 1

## 2016-10-04 MED ORDER — VILAZODONE HCL 40 MG PO TABS
40.0000 mg | ORAL_TABLET | Freq: Every day | ORAL | Status: DC
  Administered 2016-10-05: 40 mg via ORAL
  Filled 2016-10-04 (×3): qty 1

## 2016-10-04 MED ORDER — ARIPIPRAZOLE 2 MG PO TABS
ORAL_TABLET | ORAL | Status: AC
  Filled 2016-10-04: qty 1

## 2016-10-04 MED ORDER — LINAGLIPTIN 5 MG PO TABS
5.0000 mg | ORAL_TABLET | Freq: Every day | ORAL | Status: DC
  Filled 2016-10-04 (×3): qty 1

## 2016-10-04 MED ORDER — MAGNESIUM OXIDE 400 (241.3 MG) MG PO TABS
ORAL_TABLET | ORAL | Status: AC
  Filled 2016-10-04: qty 1

## 2016-10-04 MED ORDER — IOPAMIDOL (ISOVUE-300) INJECTION 61%: 100.0000 mL | Freq: Once | INTRAVENOUS | Status: DC | PRN
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
## 2016-10-04 NOTE — ED Notes (Signed)
Pt ambulated to bathroom.  Gait steady and even.   

## 2016-10-04 NOTE — ED Notes (Signed)
Pt transported to CT and Xray. 

## 2016-10-04 NOTE — ED Notes (Signed)
Phlebotomy at bedside.

## 2016-10-04 NOTE — ED Notes (Signed)
Moved pt to room 17 for psy hold, will reassess in the AM. Pt denies SI. Pt offered dinner

## 2016-10-04 NOTE — ED Notes (Signed)
Pt eating meal tray 

## 2016-10-04 NOTE — ED Notes (Signed)
Pt returned from CT °

## 2016-10-04 NOTE — ED Notes (Signed)
TTS consult in process at this time. 

## 2016-10-04 NOTE — ED Notes (Signed)
TTS machine placed at bedside.   
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
## 2016-10-04 NOTE — ED Notes (Signed)
Pt asking about night time meds. Nurse aware.
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
## 2016-10-05 DIAGNOSIS — Z8041 Family history of malignant neoplasm of ovary: Secondary | ICD-10-CM

## 2016-10-05 DIAGNOSIS — F332 Major depressive disorder, recurrent severe without psychotic features: Secondary | ICD-10-CM

## 2016-10-05 DIAGNOSIS — Z79899 Other long term (current) drug therapy: Secondary | ICD-10-CM

## 2016-10-05 DIAGNOSIS — Z813 Family history of other psychoactive substance abuse and dependence: Secondary | ICD-10-CM | POA: Diagnosis not present

## 2016-10-05 DIAGNOSIS — Z811 Family history of alcohol abuse and dependence: Secondary | ICD-10-CM

## 2016-10-05 DIAGNOSIS — Z818 Family history of other mental and behavioral disorders: Secondary | ICD-10-CM

## 2016-10-05 DIAGNOSIS — Z888 Allergy status to other drugs, medicaments and biological substances status: Secondary | ICD-10-CM

## 2016-10-05 DIAGNOSIS — F329 Major depressive disorder, single episode, unspecified: Secondary | ICD-10-CM | POA: Diagnosis not present

## 2016-10-05 LAB — CBG MONITORING, ED
GLUCOSE-CAPILLARY: 149 mg/dL — AB (ref 65–99)
GLUCOSE-CAPILLARY: 146 mg/dL — AB (ref 65–99)

## 2016-10-05 MED ORDER — ACETAMINOPHEN 325 MG PO TABS
650.0000 mg | ORAL_TABLET | Freq: Once | ORAL | Status: AC
  Administered 2016-10-05: 650 mg via ORAL
  Filled 2016-10-05: qty 2

## 2016-10-05 NOTE — ED Notes (Signed)
Re evalution with TTS completed

## 2016-10-05 NOTE — Discharge Instructions (Signed)
Follow-up with outpatient treatment as instructed by behavioral health person

## 2016-10-05 NOTE — ED Notes (Signed)
Patient sleep on stretcher at this time. Equal rise and fall of chest.

## 2016-10-05 NOTE — ED Notes (Signed)
Patient asleep and snoring. No distress noted

## 2016-10-05 NOTE — ED Notes (Signed)
Patient out of room to restroom with sitter. Patient's vitals updated while woke. Patient back in room laying down at this time.
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
## 2016-10-08 ENCOUNTER — Ambulatory Visit (HOSPITAL_COMMUNITY): Payer: Self-pay | Admitting: Psychiatry

## 2016-10-11 ENCOUNTER — Encounter (HOSPITAL_COMMUNITY): Payer: Self-pay | Admitting: Psychiatry

## 2016-10-11 ENCOUNTER — Ambulatory Visit (INDEPENDENT_AMBULATORY_CARE_PROVIDER_SITE_OTHER): Payer: Medicare Other | Admitting: Psychiatry

## 2016-10-11 DIAGNOSIS — F332 Major depressive disorder, recurrent severe without psychotic features: Secondary | ICD-10-CM | POA: Diagnosis not present
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
## 2016-10-15 DIAGNOSIS — M542 Cervicalgia: Secondary | ICD-10-CM | POA: Diagnosis not present

## 2016-10-15 DIAGNOSIS — M50321 Other cervical disc degeneration at C4-C5 level: Secondary | ICD-10-CM | POA: Diagnosis not present

## 2016-10-15 DIAGNOSIS — M545 Low back pain: Secondary | ICD-10-CM | POA: Diagnosis not present

## 2016-10-16 ENCOUNTER — Ambulatory Visit (HOSPITAL_COMMUNITY): Payer: Self-pay | Admitting: Psychiatry

## 2016-10-17 ENCOUNTER — Ambulatory Visit (INDEPENDENT_AMBULATORY_CARE_PROVIDER_SITE_OTHER): Payer: Medicare Other | Admitting: Psychiatry

## 2016-10-17 ENCOUNTER — Encounter (HOSPITAL_COMMUNITY): Payer: Self-pay | Admitting: Psychiatry

## 2016-10-17 DIAGNOSIS — F332 Major depressive disorder, recurrent severe without psychotic features: Secondary | ICD-10-CM

## 2016-10-17 NOTE — Progress Notes (Signed)
   THERAPIST PROGRESS NOTE  Session Time:  Thursday 10/17/2016 10: 16 AM -  11:15 AM  Participation Level: Active  Behavioral Response: less depressed            Type of Therapy: Individual Therapy   Treatment Goals :     1 .Identify replace thoughts that support depression        2. 1ncrease self acceptance decreasing the negative statements about self and increasing positive statements about self       3. Improve ability to manage stress and anxiety with decreased intensity and frequency as of anxiety response (panic attacks, avoidance)       4. Improve interpersonal skills  Treatment Goals addressed  1, 3   Interventions: CBT and Supportive,ACT  Summary: [name] is a 50 y.o. female who presents with a long-standing history of chronic recurrent severe depressive symptoms accompanied by chronic anxiety. She has had multiple hospitalizations due to to suicidal attempts and depression. Her current symptoms include depressed mood, anxiety, excessive worrying, panic attacks, and passive suicidal ideations.   Patient last was seen about a week ago. She denies any current suicidal ideations but admits having fleeting suicidal ideations this past weekend. She says she was upset about her friend refusing to speak to her and reports she was experiencing feelings of hopelessness and worthlessness. She admits she thought about taking a bottle of pills but kept thinking about her daughter. She and friend have reconciled and patient reports feeling better and less depressed. She is looking forward to going with another friend to the beach tomorrow for the weekend. She has not had any panic attacks since last session but has continued to experience anxiety and fears she may have a panic attack while on trip. She reports she has been practicing controlled breathing daily. She denies any alcohol use since last session.  Suicidal/Homicidal: No. Patient agrees to call this practice, call 911, or have  someone take her to the emergency room should symptoms worsen.   Therapist Response:  Reviewed symptoms, discussed patient's pattern in responding to stress, assisted patient identify strategies to resist suicidal thoughts and feelings including identifying reasons for living, list of activities to do to comfort self, and people to contact when feeling distressed and distraught, provided patient with 2 copies of list developed in session and advised patient to keep in readily accessible place, praised and reinforced patient's use of controlled breathing, .Plan: Patient agrees to return for an appointment in 1 week.   Diagnosis: Axis I: MDD, Recurrent    Axis II: Borderline Personality Dis.    Becki Mccaskill, LCSW

## 2016-10-22 DIAGNOSIS — S8012XA Contusion of left lower leg, initial encounter: Secondary | ICD-10-CM | POA: Diagnosis not present

## 2016-10-22 DIAGNOSIS — Z6837 Body mass index (BMI) 37.0-37.9, adult: Secondary | ICD-10-CM | POA: Diagnosis not present

## 2016-10-28 ENCOUNTER — Encounter (HOSPITAL_COMMUNITY): Payer: Self-pay | Admitting: Psychiatry

## 2016-10-28 ENCOUNTER — Ambulatory Visit (INDEPENDENT_AMBULATORY_CARE_PROVIDER_SITE_OTHER): Payer: Medicare Other | Admitting: Psychiatry

## 2016-10-28 VITALS — BP 152/80 | HR 90 | Ht 64.5 in | Wt 209.8 lb

## 2016-10-28 DIAGNOSIS — Z79899 Other long term (current) drug therapy: Secondary | ICD-10-CM | POA: Diagnosis not present

## 2016-10-28 DIAGNOSIS — F332 Major depressive disorder, recurrent severe without psychotic features: Secondary | ICD-10-CM

## 2016-10-28 MED ORDER — BUSPIRONE HCL 10 MG PO TABS: 10.0000 mg | ORAL_TABLET | Freq: Three times a day (TID) | ORAL | 2 refills | Status: DC
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
## 2016-10-30 ENCOUNTER — Ambulatory Visit (INDEPENDENT_AMBULATORY_CARE_PROVIDER_SITE_OTHER): Payer: Medicare Other | Admitting: Psychiatry

## 2016-10-30 ENCOUNTER — Encounter (HOSPITAL_COMMUNITY): Payer: Self-pay | Admitting: Psychiatry

## 2016-10-30 DIAGNOSIS — F332 Major depressive disorder, recurrent severe without psychotic features: Secondary | ICD-10-CM

## 2016-10-30 NOTE — Progress Notes (Signed)
   THERAPIST PROGRESS NOTE  Session Time:  Wednesday 10/30/2016 10:20 AM -11:05 AM    Participation Level: Active  Behavioral Response: less depressed            Type of Therapy: Individual Therapy   Treatment Goals :     1 .Identify replace thoughts that support depression        2. 1ncrease self acceptance decreasing the negative statements about self and increasing positive statements about self       3. Improve ability to manage stress and anxiety with decreased intensity and frequency as of anxiety response (panic attacks, avoidance)       4. Improve interpersonal skills  Treatment Goals addressed  1, 3, 4   Interventions: CBT and Supportive,ACT  Summary: [name] is a 50 y.o. female who presents with a long-standing history of chronic recurrent severe depressive symptoms accompanied by chronic anxiety. She has had multiple hospitalizations due to to suicidal attempts and depression. Her current symptoms include depressed mood, anxiety, excessive worrying, panic attacks, and passive suicidal ideations.   Patient last was seen about 2 weeks ago. She denies any current suicidal ideations but admits having passive suicidal ideations twice last week with no intent and no plan. She can't remember any triggers. She says she used thoughts of her children to help her cope. She reports having one panic attack since last session. She continues to experience nervousness. She reports being particularly anxious today regarding lying to her best friend about her involvement with another friend. Patient has been practicing controlled breathing but says she sometimes starts to panic when using controlled breathing. She reports she has used aromatherapy is says it has helped. She also is considering an app on her phone using relaxation techniques as recommended by a friend. She denies any alcohol use since last session. Patient reports less confusion about her sexual orientation and reports she is a  lesbian.  Suicidal/Homicidal: No. Patient agrees to call this practice, call 911, or have someone take her to the emergency room should symptoms worsen.   Therapist Response:  Reviewed symptoms, praised and reinforced patient's use of healthy coping skills to resist suicidal thoughts and feelings, facilitated expression of feelings regarding current relationships and discussed boundary issues,  assisted patient identify triggers of anxiety and panic attacks, reviewed the panic cycle and ways to intervene to reduce intensity and frequency of panic attacks, discussed use of progressive muscle relaxation,   .Plan: Patient agrees to return for an appointment in 2 weeks.   Diagnosis: Axis I: MDD, Recurrent    Axis II: Borderline Personality Dis.    [name], LCSW

## 2016-11-12 DIAGNOSIS — Z6837 Body mass index (BMI) 37.0-37.9, adult: Secondary | ICD-10-CM | POA: Diagnosis not present

## 2016-11-12 DIAGNOSIS — I10 Essential (primary) hypertension: Secondary | ICD-10-CM | POA: Diagnosis not present

## 2016-11-12 DIAGNOSIS — J060 Acute laryngopharyngitis: Secondary | ICD-10-CM | POA: Diagnosis not present

## 2016-11-13 ENCOUNTER — Encounter (HOSPITAL_COMMUNITY): Payer: Self-pay | Admitting: Psychiatry

## 2016-11-13 ENCOUNTER — Ambulatory Visit (INDEPENDENT_AMBULATORY_CARE_PROVIDER_SITE_OTHER): Payer: Medicare Other | Admitting: Psychiatry

## 2016-11-13 ENCOUNTER — Telehealth (HOSPITAL_COMMUNITY): Payer: Self-pay | Admitting: *Deleted

## 2016-11-13 DIAGNOSIS — F332 Major depressive disorder, recurrent severe without psychotic features: Secondary | ICD-10-CM

## 2016-11-13 NOTE — Telephone Encounter (Signed)
Pt came into office to see other provider. Per pt she went to her PCP doctor Delphina Cahill) and her BP was elevated and he thinks it could be the Buspar that is contributing to her elevated BP. Pt would like to know what to do. Pt f/u appt is scheduled for 11-20-2016 which is next week. Pt number is [phone].
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
## 2016-11-13 NOTE — Telephone Encounter (Signed)
noted 

## 2016-11-13 NOTE — Telephone Encounter (Signed)
Called pt due to previous message. lmtcb and office number provided

## 2016-11-13 NOTE — Telephone Encounter (Signed)
Pt called office back. Spoke with pt and informed her with what provider stated and she verbalized understanding. Per pt she thinks her BP at her PCP office was 160 something over 88. Per pt she went out and bought her a cuff for home and for the past couple days her bottle number has been in the 90s. Informed pt to try to take her BP like what Dr. Harrington Challenger wants her to do for a few days and call office back and pt agreed.

## 2016-11-13 NOTE — Telephone Encounter (Signed)
How high was her bp? This a very infrequent and unlikely side effect from buspar. She needs to take her bp several times a day fro a few days and see if it stays high consistently

## 2016-11-20 ENCOUNTER — Ambulatory Visit (INDEPENDENT_AMBULATORY_CARE_PROVIDER_SITE_OTHER): Payer: Medicare Other | Admitting: Psychiatry

## 2016-11-20 ENCOUNTER — Encounter (HOSPITAL_COMMUNITY): Payer: Self-pay | Admitting: Psychiatry

## 2016-11-20 VITALS — BP 151/90 | HR 93 | Ht 65.0 in | Wt 206.0 lb

## 2016-11-20 DIAGNOSIS — Z79899 Other long term (current) drug therapy: Secondary | ICD-10-CM

## 2016-11-20 DIAGNOSIS — F332 Major depressive disorder, recurrent severe without psychotic features: Secondary | ICD-10-CM | POA: Diagnosis not present

## 2016-11-20 MED ORDER — BUSPIRONE HCL 10 MG PO TABS: 10.0000 mg | ORAL_TABLET | Freq: Three times a day (TID) | ORAL | 2 refills | Status: DC

## 2016-11-20 MED ORDER — VILAZODONE HCL 40 MG PO TABS: 40.0000 mg | ORAL_TABLET | Freq: Every day | ORAL | 2 refills | Status: DC

## 2016-11-20 MED ORDER — ARIPIPRAZOLE 2 MG PO TABS: 2.0000 mg | ORAL_TABLET | Freq: Every day | ORAL | 2 refills | Status: DC

## 2016-11-20 MED ORDER — VENLAFAXINE HCL ER 75 MG PO CP24: 75.0000 mg | ORAL_CAPSULE | Freq: Every day | ORAL | 2 refills | Status: DC

## 2016-11-20 MED ORDER — CLONAZEPAM 0.5 MG PO TABS: 0.5000 mg | ORAL_TABLET | Freq: Three times a day (TID) | ORAL | 2 refills | Status: DC
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
## 2016-11-23 ENCOUNTER — Emergency Department (HOSPITAL_COMMUNITY)
Admission: EM | Admit: 2016-11-23 | Discharge: 2016-11-24 | Disposition: A | Discharge status: Other Acute Inpatient Hospital | Payer: Medicare Other | Attending: Emergency Medicine | Admitting: Emergency Medicine

## 2016-11-23 ENCOUNTER — Encounter (HOSPITAL_COMMUNITY): Payer: Self-pay | Admitting: Emergency Medicine

## 2016-11-23 DIAGNOSIS — E119 Type 2 diabetes mellitus without complications: Secondary | ICD-10-CM | POA: Insufficient documentation

## 2016-11-23 DIAGNOSIS — Z5181 Encounter for therapeutic drug level monitoring: Secondary | ICD-10-CM | POA: Insufficient documentation

## 2016-11-23 DIAGNOSIS — R45851 Suicidal ideations: Secondary | ICD-10-CM | POA: Diagnosis not present

## 2016-11-23 DIAGNOSIS — I10 Essential (primary) hypertension: Secondary | ICD-10-CM | POA: Diagnosis not present

## 2016-11-23 DIAGNOSIS — F332 Major depressive disorder, recurrent severe without psychotic features: Secondary | ICD-10-CM | POA: Diagnosis not present

## 2016-11-23 LAB — RAPID URINE DRUG SCREEN, HOSP PERFORMED
Amphetamines: NOT DETECTED
Barbiturates: NOT DETECTED
Benzodiazepines: NOT DETECTED
COCAINE: NOT DETECTED
OPIATES: NOT DETECTED
TETRAHYDROCANNABINOL: NOT DETECTED

## 2016-11-23 LAB — ETHANOL

## 2016-11-23 LAB — CBC
HCT: 46.5 % — ABNORMAL HIGH (ref 36.0–46.0)
HEMOGLOBIN: 15.4 g/dL — AB (ref 12.0–15.0)
MCH: 26.9 pg (ref 26.0–34.0)
MCHC: 33.1 g/dL (ref 30.0–36.0)
MCV: 81.2 fL (ref 78.0–100.0)
PLATELETS: 188 10*3/uL (ref 150–400)
RBC: 5.73 MIL/uL — AB (ref 3.87–5.11)
RDW: 13.7 % (ref 11.5–15.5)
WBC: 10.5 10*3/uL (ref 4.0–10.5)

## 2016-11-23 LAB — COMPREHENSIVE METABOLIC PANEL
ALT: 51 U/L (ref 14–54)
AST: 69 U/L — AB (ref 15–41)
Albumin: 3.8 g/dL (ref 3.5–5.0)
Alkaline Phosphatase: 83 U/L (ref 38–126)
Anion gap: 11 (ref 5–15)
BUN: 14 mg/dL (ref 6–20)
CHLORIDE: 99 mmol/L — AB (ref 101–111)
CO2: 23 mmol/L (ref 22–32)
CREATININE: 0.64 mg/dL (ref 0.44–1.00)
Calcium: 9.6 mg/dL (ref 8.9–10.3)
Glucose, Bld: 134 mg/dL — ABNORMAL HIGH (ref 65–99)
POTASSIUM: 3.9 mmol/L (ref 3.5–5.1)
Sodium: 133 mmol/L — ABNORMAL LOW (ref 135–145)
Total Bilirubin: 0.3 mg/dL (ref 0.3–1.2)
Total Protein: 7.1 g/dL (ref 6.5–8.1)

## 2016-11-23 LAB — SALICYLATE LEVEL

## 2016-11-23 LAB — ACETAMINOPHEN LEVEL: Acetaminophen (Tylenol), Serum: <10 ug/mL — ABNORMAL LOW (ref 10–30)

## 2016-11-23 NOTE — ED Notes (Signed)
ED Provider at bedside. 
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
## 2016-11-23 NOTE — ED Notes (Signed)
TTS in progress 

## 2016-11-23 NOTE — ED Triage Notes (Signed)
Pt reports she has been having SI x2-3 days. States she would 'take a bottle of pills". Pt denies SI attempt or HI thoughts. Recent trouble in relationship.
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
## 2016-11-23 NOTE — ED Notes (Signed)
Patient changed into burgundy scrubs and items placed into belongings bag.  Security at bedside and wanded patient. Items placed in secured locker.

## 2016-11-23 NOTE — ED Notes (Signed)
Pt has been wanded by security at this time.

## 2016-11-24 ENCOUNTER — Inpatient Hospital Stay (HOSPITAL_COMMUNITY)
Admission: AD | Admit: 2016-11-24 | Discharge: 2016-11-27 | DRG: 885 | Disposition: A | Discharge status: Home / Self Care | Payer: Medicare Other | Source: Intra-hospital | Attending: Psychiatry | Admitting: Psychiatry

## 2016-11-24 ENCOUNTER — Encounter (HOSPITAL_COMMUNITY): Payer: Self-pay | Admitting: *Deleted

## 2016-11-24 DIAGNOSIS — Z5181 Encounter for therapeutic drug level monitoring: Secondary | ICD-10-CM | POA: Diagnosis not present

## 2016-11-24 DIAGNOSIS — E119 Type 2 diabetes mellitus without complications: Secondary | ICD-10-CM | POA: Diagnosis not present

## 2016-11-24 DIAGNOSIS — Z62810 Personal history of physical and sexual abuse in childhood: Secondary | ICD-10-CM | POA: Diagnosis present

## 2016-11-24 DIAGNOSIS — Z888 Allergy status to other drugs, medicaments and biological substances status: Secondary | ICD-10-CM | POA: Diagnosis not present

## 2016-11-24 DIAGNOSIS — R45851 Suicidal ideations: Secondary | ICD-10-CM | POA: Diagnosis present

## 2016-11-24 DIAGNOSIS — Z90710 Acquired absence of both cervix and uterus: Secondary | ICD-10-CM | POA: Diagnosis not present

## 2016-11-24 DIAGNOSIS — F1020 Alcohol dependence, uncomplicated: Secondary | ICD-10-CM | POA: Diagnosis present

## 2016-11-24 DIAGNOSIS — M1990 Unspecified osteoarthritis, unspecified site: Secondary | ICD-10-CM | POA: Diagnosis present

## 2016-11-24 DIAGNOSIS — F4000 Agoraphobia, unspecified: Secondary | ICD-10-CM | POA: Diagnosis present

## 2016-11-24 DIAGNOSIS — F1120 Opioid dependence, uncomplicated: Secondary | ICD-10-CM | POA: Diagnosis not present

## 2016-11-24 DIAGNOSIS — Z818 Family history of other mental and behavioral disorders: Secondary | ICD-10-CM

## 2016-11-24 DIAGNOSIS — Z9889 Other specified postprocedural states: Secondary | ICD-10-CM | POA: Diagnosis not present

## 2016-11-24 DIAGNOSIS — F0781 Postconcussional syndrome: Secondary | ICD-10-CM | POA: Diagnosis present

## 2016-11-24 DIAGNOSIS — F332 Major depressive disorder, recurrent severe without psychotic features: Secondary | ICD-10-CM | POA: Diagnosis not present

## 2016-11-24 DIAGNOSIS — Z8041 Family history of malignant neoplasm of ovary: Secondary | ICD-10-CM | POA: Diagnosis not present

## 2016-11-24 DIAGNOSIS — I10 Essential (primary) hypertension: Secondary | ICD-10-CM | POA: Diagnosis not present

## 2016-11-24 MED ORDER — LISINOPRIL 10 MG PO TABS
20.0000 mg | ORAL_TABLET | Freq: Every day | ORAL | Status: DC
  Administered 2016-11-24: 20 mg via ORAL
  Filled 2016-11-24: qty 2

## 2016-11-24 MED ORDER — CANAGLIFLOZIN 100 MG PO TABS
100.0000 mg | ORAL_TABLET | Freq: Every day | ORAL | Status: DC
  Administered 2016-11-24: 100 mg via ORAL
  Filled 2016-11-24 (×2): qty 1

## 2016-11-24 MED ORDER — MAGNESIUM HYDROXIDE 400 MG/5ML PO SUSP: 30.0000 mL | Freq: Every day | ORAL | Status: DC | PRN

## 2016-11-24 MED ORDER — LISINOPRIL-HYDROCHLOROTHIAZIDE 20-25 MG PO TABS: 1.0000 | ORAL_TABLET | Freq: Every day | ORAL | Status: DC

## 2016-11-24 MED ORDER — CANAGLIFLOZIN 100 MG PO TABS
200.0000 mg | ORAL_TABLET | Freq: Every day | ORAL | Status: DC
  Filled 2016-11-24: qty 2

## 2016-11-24 MED ORDER — VILAZODONE HCL 40 MG PO TABS
40.0000 mg | ORAL_TABLET | Freq: Every day | ORAL | Status: DC
  Administered 2016-11-25 – 2016-11-27 (×3): 40 mg via ORAL
  Filled 2016-11-24 (×5): qty 1

## 2016-11-24 MED ORDER — METFORMIN HCL ER 500 MG PO TB24
500.0000 mg | ORAL_TABLET | Freq: Every day | ORAL | Status: DC
  Administered 2016-11-24 – 2016-11-26 (×3): 500 mg via ORAL
  Filled 2016-11-24 (×6): qty 1

## 2016-11-24 MED ORDER — PRAVASTATIN SODIUM 40 MG PO TABS
40.0000 mg | ORAL_TABLET | Freq: Every day | ORAL | Status: DC
  Administered 2016-11-24: 40 mg via ORAL
  Filled 2016-11-24 (×2): qty 1

## 2016-11-24 MED ORDER — METFORMIN HCL ER 500 MG PO TB24
500.0000 mg | ORAL_TABLET | Freq: Every day | ORAL | Status: DC
  Administered 2016-11-24: 500 mg via ORAL
  Filled 2016-11-24 (×2): qty 1

## 2016-11-24 MED ORDER — HYDROXYZINE HCL 25 MG PO TABS
25.0000 mg | ORAL_TABLET | Freq: Four times a day (QID) | ORAL | Status: DC | PRN
  Administered 2016-11-24: 25 mg via ORAL
  Filled 2016-11-24 (×2): qty 1

## 2016-11-24 MED ORDER — CANAGLIFLOZIN-METFORMIN HCL ER 150-500 MG PO TB24: 1.0000 | ORAL_TABLET | Freq: Two times a day (BID) | ORAL | Status: DC

## 2016-11-24 MED ORDER — LINAGLIPTIN 5 MG PO TABS
5.0000 mg | ORAL_TABLET | Freq: Every day | ORAL | Status: DC
  Administered 2016-11-25 – 2016-11-27 (×3): 5 mg via ORAL
  Filled 2016-11-24 (×5): qty 1

## 2016-11-24 MED ORDER — METFORMIN HCL ER 500 MG PO TB24
500.0000 mg | ORAL_TABLET | Freq: Every day | ORAL | Status: DC
  Administered 2016-11-25 – 2016-11-27 (×3): 500 mg via ORAL
  Filled 2016-11-24 (×5): qty 1

## 2016-11-24 MED ORDER — ACETAMINOPHEN 325 MG PO TABS: 650.0000 mg | ORAL_TABLET | Freq: Four times a day (QID) | ORAL | Status: DC | PRN

## 2016-11-24 MED ORDER — CANAGLIFLOZIN 100 MG PO TABS
200.0000 mg | ORAL_TABLET | Freq: Every day | ORAL | Status: DC
  Administered 2016-11-24 – 2016-11-26 (×3): 200 mg via ORAL
  Filled 2016-11-24 (×7): qty 2

## 2016-11-24 MED ORDER — CANAGLIFLOZIN 100 MG PO TABS
100.0000 mg | ORAL_TABLET | Freq: Every day | ORAL | Status: DC
  Administered 2016-11-25 – 2016-11-27 (×3): 100 mg via ORAL
  Filled 2016-11-24 (×5): qty 1

## 2016-11-24 MED ORDER — METOPROLOL TARTRATE 25 MG PO TABS
25.0000 mg | ORAL_TABLET | Freq: Two times a day (BID) | ORAL | Status: DC
  Administered 2016-11-24: 25 mg via ORAL
  Filled 2016-11-24: qty 1

## 2016-11-24 MED ORDER — ARIPIPRAZOLE 2 MG PO TABS
2.0000 mg | ORAL_TABLET | Freq: Every day | ORAL | Status: DC
  Administered 2016-11-25 – 2016-11-27 (×3): 2 mg via ORAL
  Filled 2016-11-24 (×5): qty 1

## 2016-11-24 MED ORDER — LINAGLIPTIN 5 MG PO TABS
5.0000 mg | ORAL_TABLET | Freq: Every day | ORAL | Status: DC
  Administered 2016-11-24: 5 mg via ORAL
  Filled 2016-11-24 (×2): qty 1

## 2016-11-24 MED ORDER — CLONAZEPAM 0.5 MG PO TABS
0.5000 mg | ORAL_TABLET | Freq: Three times a day (TID) | ORAL | Status: DC
  Administered 2016-11-24 – 2016-11-26 (×6): 0.5 mg via ORAL
  Filled 2016-11-24 (×6): qty 1

## 2016-11-24 MED ORDER — ESTRADIOL 2 MG PO TABS
2.0000 mg | ORAL_TABLET | Freq: Every day | ORAL | Status: DC
  Administered 2016-11-24 – 2016-11-27 (×4): 2 mg via ORAL
  Filled 2016-11-24 (×5): qty 1
  Filled 2016-11-24: qty 2
  Filled 2016-11-24: qty 1

## 2016-11-24 MED ORDER — VENLAFAXINE HCL ER 75 MG PO CP24
75.0000 mg | ORAL_CAPSULE | Freq: Every day | ORAL | Status: DC
  Administered 2016-11-25: 75 mg via ORAL
  Filled 2016-11-24 (×2): qty 1

## 2016-11-24 MED ORDER — VENLAFAXINE HCL ER 37.5 MG PO CP24
75.0000 mg | ORAL_CAPSULE | Freq: Every day | ORAL | Status: DC
  Administered 2016-11-24: 75 mg via ORAL
  Filled 2016-11-24: qty 2

## 2016-11-24 MED ORDER — VILAZODONE HCL 40 MG PO TABS
40.0000 mg | ORAL_TABLET | Freq: Every day | ORAL | Status: DC
  Administered 2016-11-24: 40 mg via ORAL
  Filled 2016-11-24 (×2): qty 1

## 2016-11-24 MED ORDER — ARIPIPRAZOLE 2 MG PO TABS
2.0000 mg | ORAL_TABLET | Freq: Every day | ORAL | Status: DC
  Administered 2016-11-24: 2 mg via ORAL
  Filled 2016-11-24 (×2): qty 1

## 2016-11-24 MED ORDER — ALUM & MAG HYDROXIDE-SIMETH 200-200-20 MG/5ML PO SUSP: 30.0000 mL | ORAL | Status: DC | PRN

## 2016-11-24 MED ORDER — METOPROLOL TARTRATE 25 MG PO TABS
25.0000 mg | ORAL_TABLET | Freq: Two times a day (BID) | ORAL | Status: DC
  Administered 2016-11-24 – 2016-11-27 (×6): 25 mg via ORAL
  Filled 2016-11-24 (×12): qty 1

## 2016-11-24 MED ORDER — METFORMIN HCL ER 500 MG PO TB24
500.0000 mg | ORAL_TABLET | Freq: Every day | ORAL | Status: DC
  Filled 2016-11-24: qty 1

## 2016-11-24 MED ORDER — HYDROCHLOROTHIAZIDE 25 MG PO TABS
25.0000 mg | ORAL_TABLET | Freq: Every day | ORAL | Status: DC
  Administered 2016-11-24: 25 mg via ORAL
  Filled 2016-11-24: qty 1

## 2016-11-24 MED ORDER — BUSPIRONE HCL 5 MG PO TABS
10.0000 mg | ORAL_TABLET | Freq: Three times a day (TID) | ORAL | Status: DC
  Administered 2016-11-24: 10 mg via ORAL
  Filled 2016-11-24 (×4): qty 2

## 2016-11-24 MED ORDER — LISINOPRIL 20 MG PO TABS
20.0000 mg | ORAL_TABLET | Freq: Every day | ORAL | Status: DC
  Administered 2016-11-25 – 2016-11-27 (×3): 20 mg via ORAL
  Filled 2016-11-24 (×5): qty 1

## 2016-11-24 MED ORDER — BUSPIRONE HCL 10 MG PO TABS
10.0000 mg | ORAL_TABLET | Freq: Three times a day (TID) | ORAL | Status: DC
  Administered 2016-11-24 – 2016-11-26 (×5): 10 mg via ORAL
  Filled 2016-11-24 (×3): qty 1
  Filled 2016-11-24: qty 2
  Filled 2016-11-24 (×5): qty 1

## 2016-11-24 MED ORDER — HYDROCHLOROTHIAZIDE 25 MG PO TABS
25.0000 mg | ORAL_TABLET | Freq: Every day | ORAL | Status: DC
  Administered 2016-11-25 – 2016-11-27 (×3): 25 mg via ORAL
  Filled 2016-11-24 (×5): qty 1

## 2016-11-24 MED ORDER — PRAVASTATIN SODIUM 40 MG PO TABS
40.0000 mg | ORAL_TABLET | Freq: Every day | ORAL | Status: DC
  Administered 2016-11-25 – 2016-11-27 (×3): 40 mg via ORAL
  Filled 2016-11-24 (×5): qty 1

## 2016-11-24 NOTE — ED Notes (Signed)
Pt to shower with sitter and security.

## 2016-11-24 NOTE — Progress Notes (Signed)
Patient did not attend the evening speaker [place] meeting. Pt attended the wrap up group on the 400 hall.

## 2016-11-24 NOTE — ED Notes (Signed)
Spoke with Arby Barrette at [facility].  Currently do not have bed and will send referrals.

## 2016-11-24 NOTE — ED Notes (Signed)
Pt up to bathroom, requesting a sprite to drink; sprite given

## 2016-11-24 NOTE — ED Provider Notes (Signed)
Pt accepted to [facility]. Will transfer stable.    Francine Graven, DO 11/24/16 1325

## 2016-11-24 NOTE — Progress Notes (Signed)
D.  Pt in bed but awake on approach, denies complaints at this time.  Pt did attend evening wrap up group on 400 hall, minimal interaction with peers on the unit this evening.  Pt denies SI/HI/hallucinations at this time.  A.  Support and encouragement offered, medication given as ordered  R.  Pt remains safe on the unit, will continue to monitor.

## 2016-11-24 NOTE — BH Assessment (Signed)
[place] Assessment Progress Note  Pt has been accepted to [facility].  Room assignment will need to be made mid morning on 11/26.  Daytime nurse to contact the day [facility] at [phone] after 11:00 to coordinate time of transfer.  Accepting physician is Dr. Parke Poisson.  Pt will be going to 400 hall.

## 2016-11-24 NOTE — ED Notes (Signed)
Inform Dr Thurnell Garbe that pt needs medications.

## 2016-11-24 NOTE — Progress Notes (Signed)
Patient ID: [name], female   DOB: 1966-02-27, 50 y.o.   MRN: [id number] Per State regulations 482.30 this chart was reviewed for medical necessity with respect to the patient's admission/duration of stay.    Next review date: 11/28/16  Debarah Crape, BSN, RN-BC  Case Manager

## 2016-11-24 NOTE — Progress Notes (Signed)
Patient ID: [name], female   DOB: Nov 05, 1966, 50 y.o.   MRN: [id number]    50 year old white female admitted after she presented to [facility] reporting SI with plan to overdose. Pt reported at time of admission that she was completely numb and that she just wanted to die. Pt reported that she was having SI with plan to overdose on a bottle of pills. Pt reported that she lives with her daughter and at present her daughter is with her mother. Pt reported that she was last here in September, and that she remained very depressed. Pt reported that she was negative HI, no AH/VH noted. Pt reported that his depression was a 9, her hopelessness was a 10, and her anxiety was a 8. Pt reported that she did not use any alcohol or drugs.

## 2016-11-24 NOTE — ED Notes (Signed)
Wells Guiles from [facility] transportation here to transport.

## 2016-11-24 NOTE — Progress Notes (Signed)
Reviewed pt's chart. Pt was assessed by TTS 11/25 and recommended to receive inpt treatment. Note indicates that at that time, [facility] expected to have bed available for pt after 11am today, however per [facility] currently no beds available.  Will proceed with referrals elsewhere and pt considered for [facility] if bed comes available.  Referred to: [place] accepted to [facility] bed [phone] for programming on 400 hall. Attending MD will be Dr. Parke Poisson

## 2016-11-24 NOTE — ED Notes (Signed)
Back from shower.  Security given 3 yellow tone rings and pair of yellow tone earrings.

## 2016-11-24 NOTE — BHH Counselor (Signed)
Per Galen Daft at [facility], pt has been accepted to bed 307-2. Call number for report [phone].  Charge RN aware. Pt will program on 400 hall. She can be transported now.  Arnold Long, [place] Therapeutic Triage Specialist

## 2016-11-24 NOTE — Tx Team (Signed)
Initial Treatment Plan 11/24/2016 4:25 PM [name] [id number]    PATIENT STRESSORS: Financial difficulties Health problems   PATIENT STRENGTHS: Ability for insight General fund of knowledge   PATIENT IDENTIFIED PROBLEMS: Depression    SI with plan to overdose    "i was going to take a bottle of pills"             DISCHARGE CRITERIA:  Improved stabilization in mood, thinking, and/or behavior Need for constant or close observation no longer present  PRELIMINARY DISCHARGE PLAN: Return to previous living arrangement  PATIENT/FAMILY INVOLVEMENT: This treatment plan has been presented to and reviewed with the patient, [name], and/or family member.  The patient and family have been given the opportunity to ask questions and make suggestions.  Ventura Sellers, RN 11/24/2016, 4:25 PM

## 2016-11-25 DIAGNOSIS — Z818 Family history of other mental and behavioral disorders: Secondary | ICD-10-CM

## 2016-11-25 DIAGNOSIS — Z8041 Family history of malignant neoplasm of ovary: Secondary | ICD-10-CM

## 2016-11-25 DIAGNOSIS — R45851 Suicidal ideations: Secondary | ICD-10-CM

## 2016-11-25 DIAGNOSIS — Z79899 Other long term (current) drug therapy: Secondary | ICD-10-CM

## 2016-11-25 DIAGNOSIS — Z9889 Other specified postprocedural states: Secondary | ICD-10-CM

## 2016-11-25 DIAGNOSIS — F1120 Opioid dependence, uncomplicated: Secondary | ICD-10-CM

## 2016-11-25 DIAGNOSIS — F332 Major depressive disorder, recurrent severe without psychotic features: Principal | ICD-10-CM

## 2016-11-25 DIAGNOSIS — Z888 Allergy status to other drugs, medicaments and biological substances status: Secondary | ICD-10-CM

## 2016-11-25 DIAGNOSIS — F1020 Alcohol dependence, uncomplicated: Secondary | ICD-10-CM

## 2016-11-25 MED ORDER — VENLAFAXINE HCL ER 37.5 MG PO CP24
37.5000 mg | ORAL_CAPSULE | Freq: Every day | ORAL | Status: DC
  Administered 2016-11-26 – 2016-11-27 (×2): 37.5 mg via ORAL
  Filled 2016-11-25 (×3): qty 1
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
## 2016-11-25 NOTE — Progress Notes (Signed)
Recreation Therapy Notes  Date: 11/25/16 Time: 0930 Location: [address] Dayroom  Group Topic: Coping Skills  Goal Area(s) Addresses:  Pt will be able to identify positive coping skills. Pt will be able to identify the importance of coping skills. Pt will be able to identify effects of coping skills post d/c?  Intervention: Magazines, scissors, glue sticks, construction paper, coping skills worksheet  Activity: Patients were given a worksheet divided into five areas: diversions, cognitive, social, tension releasers and physical.  Patients were to look through the magazines and find pictures that display coping skills that could be used for each area.  If the patient couldn't find a picture for the coping skill they wanted, they could write it in.  Education: Radiographer, therapeutic, Dentist.   Education Outcome: Acknowledges understanding/In group clarification offered/Needs additional education.   Clinical Observations/Feedback: Pt did not attend group.    Victorino Sparrow, LRT/CTRS         Victorino Sparrow A 11/25/2016 12:26 PM

## 2016-11-25 NOTE — Tx Team (Signed)
Interdisciplinary Treatment and Diagnostic Plan Update  11/25/2016 Time of Session: 9:30am [name] MRN: [id number]  Principal Diagnosis: MDD (major depressive disorder), recurrent severe, without psychosis ([place])  Secondary Diagnoses: Principal Problem:   MDD (major depressive disorder), recurrent severe, without psychosis ([place]) Active Problems:   Chronic traumatic encephalopathy   Opioid type dependence ([place])   Alcohol dependence ([place])   Current Medications:  Current Facility-Administered Medications  Medication Dose Route Frequency Provider Last Rate Last Dose  . acetaminophen (TYLENOL) tablet 650 mg  650 mg Oral Q6H PRN Ethelene Hal, NP      . alum & mag hydroxide-simeth (MAALOX/MYLANTA) 200-200-20 MG/5ML suspension 30 mL  30 mL Oral Q4H PRN Ethelene Hal, NP      . ARIPiprazole (ABILIFY) tablet 2 mg  2 mg Oral Daily Ethelene Hal, NP   2 mg at 11/25/16 0820  . busPIRone (BUSPAR) tablet 10 mg  10 mg Oral TID Ethelene Hal, NP   10 mg at 11/25/16 0820  . canagliflozin (INVOKANA) tablet 100 mg  100 mg Oral Q breakfast Ethelene Hal, NP   100 mg at 11/25/16 [id number]   And  . metFORMIN (GLUCOPHAGE-XR) 24 hr tablet 500 mg  500 mg Oral Q breakfast Ethelene Hal, NP   500 mg at 11/25/16 [id number]  . canagliflozin (INVOKANA) tablet 200 mg  200 mg Oral Q supper Ethelene Hal, NP   200 mg at 11/24/16 1826   And  . metFORMIN (GLUCOPHAGE-XR) 24 hr tablet 500 mg  500 mg Oral Q supper Ethelene Hal, NP   500 mg at 11/24/16 1811  . clonazePAM (KLONOPIN) tablet 0.5 mg  0.5 mg Oral TID Ethelene Hal, NP   0.5 mg at 11/25/16 0826  . estradiol (ESTRACE) tablet 2 mg  2 mg Oral Daily Ethelene Hal, NP   2 mg at 11/25/16 [id number]  . lisinopril (PRINIVIL,ZESTRIL) tablet 20 mg  20 mg Oral Daily Ethelene Hal, NP   20 mg at 11/25/16 [id number]   And  . hydrochlorothiazide (HYDRODIURIL) tablet 25 mg  25 mg Oral Daily Ethelene Hal, NP    25 mg at 11/25/16 [id number]  . hydrOXYzine (ATARAX/VISTARIL) tablet 25 mg  25 mg Oral Q6H PRN Ethelene Hal, NP   25 mg at 11/24/16 2214  . linagliptin (TRADJENTA) tablet 5 mg  5 mg Oral Daily Ethelene Hal, NP   5 mg at 11/25/16 [id number]  . magnesium hydroxide (MILK OF MAGNESIA) suspension 30 mL  30 mL Oral Daily PRN Ethelene Hal, NP      . metoprolol tartrate (LOPRESSOR) tablet 25 mg  25 mg Oral BID Ethelene Hal, NP   25 mg at 11/25/16 [id number]  . pravastatin (PRAVACHOL) tablet 40 mg  40 mg Oral Daily Ethelene Hal, NP   40 mg at 11/25/16 [id number]  . venlafaxine XR (EFFEXOR-XR) 24 hr capsule 75 mg  75 mg Oral Q breakfast Ethelene Hal, NP   75 mg at 11/25/16 [id number]  . Vilazodone HCl (VIIBRYD) TABS 40 mg  40 mg Oral Daily Ethelene Hal, NP   40 mg at 11/25/16 [id number]   PTA Medications: Prescriptions Prior to Admission  Medication Sig Dispense Refill Last Dose  . ARIPiprazole (ABILIFY) 2 MG tablet Take 1 tablet (2 mg total) by mouth daily. 30 tablet 2 Past Week at Unknown time  . busPIRone (BUSPAR) 10 MG tablet Take 1 tablet (10 mg total) by mouth 3 (  three) times daily. 90 tablet 2 Past Week at Unknown time  . clonazePAM (KLONOPIN) 0.5 MG tablet Take 1 tablet (0.5 mg total) by mouth 3 (three) times daily. 90 tablet 2 Past Week at Unknown time  . estradiol (ESTRACE) 2 MG tablet take 1 tablet by mouth once daily 30 tablet 11 Past Week at Unknown time  . INVOKAMET XR 150-500 MG TB24 Take 1 tablet by mouth 2 (two) times daily.   Past Week at Unknown time  . linagliptin (TRADJENTA) 5 MG TABS tablet Take 1 tablet (5 mg total) by mouth daily. (Patient taking differently: Take 5 mg by mouth at bedtime. ) 30 tablet 0 Past Week at Unknown time  . lisinopril-hydrochlorothiazide (PRINZIDE,ZESTORETIC) 20-25 MG tablet Take 1 tablet by mouth daily.   Past Week at Unknown time  . Magnesium 250 MG TABS Take 1 tablet by mouth at bedtime.   Past Week at Unknown time  .  medroxyPROGESTERone (PROVERA) 2.5 MG tablet Take 1 tablet (2.5 mg total) by mouth daily. 30 tablet 0 Past Week at Unknown time  . metoprolol tartrate (LOPRESSOR) 25 MG tablet Take 1 tablet (25 mg total) by mouth 2 (two) times daily. 30 tablet 0 Past Week at Unknown time  . pravastatin (PRAVACHOL) 40 MG tablet Take 1 tablet (40 mg total) by mouth daily. 30 tablet 0 Past Week at Unknown time  . venlafaxine XR (EFFEXOR-XR) 75 MG 24 hr capsule Take 1 capsule (75 mg total) by mouth daily with breakfast. 30 capsule 2 Past Week at Unknown time  . Vilazodone HCl (VIIBRYD) 40 MG TABS Take 1 tablet (40 mg total) by mouth daily. 30 tablet 2 Past Week at Unknown time    Patient Stressors: Financial difficulties Health problems  Patient Strengths: Ability for insight General fund of knowledge  Treatment Modalities: Medication Management, Group therapy, Case management,  1 to 1 session with clinician, Psychoeducation, Recreational therapy.   Physician Treatment Plan for Primary Diagnosis: MDD (major depressive disorder), recurrent severe, without psychosis ([place]) Long Term Goal(s):     Short Term Goals:    Medication Management: Evaluate patient's response, side effects, and tolerance of medication regimen.  Therapeutic Interventions: 1 to 1 sessions, Unit Group sessions and Medication administration.  Evaluation of Outcomes: Progressing  Physician Treatment Plan for Secondary Diagnosis: Principal Problem:   MDD (major depressive disorder), recurrent severe, without psychosis ([place]) Active Problems:   Chronic traumatic encephalopathy   Opioid type dependence ([place])   Alcohol dependence ([place])  Long Term Goal(s):     Short Term Goals:       Medication Management: Evaluate patient's response, side effects, and tolerance of medication regimen.  Therapeutic Interventions: 1 to 1 sessions, Unit Group sessions and Medication administration.  Evaluation of Outcomes: Progressing   RN Treatment  Plan for Primary Diagnosis: MDD (major depressive disorder), recurrent severe, without psychosis ([place]) Long Term Goal(s): Knowledge of disease and therapeutic regimen to maintain health will improve  Short Term Goals: Ability to remain free from injury will improve, Ability to disclose and discuss suicidal ideas, Ability to identify and develop effective coping behaviors will improve and Compliance with prescribed medications will improve  Medication Management: RN will administer medications as ordered by provider, will assess and evaluate patient's response and provide education to patient for prescribed medication. RN will report any adverse and/or side effects to prescribing provider.  Therapeutic Interventions: 1 on 1 counseling sessions, Psychoeducation, Medication administration, Evaluate responses to treatment, Monitor vital signs and CBGs as ordered, Perform/monitor  CIWA, COWS, AIMS and Fall Risk screenings as ordered, Perform wound care treatments as ordered.  Evaluation of Outcomes: Progressing   LCSW Treatment Plan for Primary Diagnosis: MDD (major depressive disorder), recurrent severe, without psychosis ([place]) Long Term Goal(s): Safe transition to appropriate next level of care at discharge, Engage patient in therapeutic group addressing interpersonal concerns.  Short Term Goals: Engage patient in aftercare planning with referrals and resources, Increase social support, Increase emotional regulation, Identify triggers associated with mental health/substance abuse issues and Increase skills for wellness and recovery  Therapeutic Interventions: Assess for all discharge needs, 1 to 1 time with Social worker, Explore available resources and support systems, Assess for adequacy in community support network, Educate family and significant other(s) on suicide prevention, Complete Psychosocial Assessment, Interpersonal group therapy.  Evaluation of Outcomes: Progressing   Progress in  Treatment :  Attending groups: Continuing to assess  Participating in groups: Continuing to assess  Taking medication as prescribed: Yes, MD continuing to assess for appropriate medication regimen  Toleration medication: Yes  Family/Significant other contact made: Treatment team assessing for appropriate contacts  Patient understands diagnosis: Yes  Discussing patient identified problems/goals with staff: Yes  Medical problems stabilized or resolved: Yes  Denies suicidal/homicidal ideation: No, patient continues to endorse passive SI  Issues/concerns per patient self-inventory: None reported  Other: N/A  New problem(s) identified: None reported at this time    New Short Term/Long Term Goal(s): None at this time    Discharge Plan or Barriers: Patient will return home to follow up with current outpatient provider at [facility] in [place].    Reason for Continuation of Hospitalization: Anxiety Depression Medication stabilization Suicidal Ideations Withdrawal symptoms  Estimated Length of Stay: 2-3 days    Attendees:  Patient:              Physician: Dr. Parke Poisson, Dr. Sharolyn Douglas, MD  11/25/2016   9:30am  Nursing: Grayland Ormond, Marvel Plan, RN  11/25/2016 9:30am  RN Care Manager: Lars Pinks, CM  11/25/2016 9:30am  Social Workers: Peri Maris, LCSW, Tilden Fossa, LCSW, [place], LCSW   11/25/2016 9:30am  Nurse Pratictioners: Andria Rhein, NP 11/25/2016 9:30am  Other:                  Scribe for Treatment Team: Tilden Fossa, [place] Worker [facility] [phone]

## 2016-11-25 NOTE — Plan of Care (Signed)
Problem: Medication: Goal: Compliance with prescribed medication regimen will improve Outcome: Progressing Pt will be compliant with all meds.
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
## 2016-11-25 NOTE — Progress Notes (Signed)
Psychoeducational Group Note  Date:  11/25/2016 Time:  2335  Group Topic/Focus:  Wrap-Up Group:   The focus of this group is to help patients review their daily goal of treatment and discuss progress on daily workbooks.   Participation Level: Did Not Attend  Participation Quality:  Not Applicable  Affect:  Not Applicable  Cognitive:  Not Applicable  Insight:  Not Applicable  Engagement in Group: Not Applicable  Additional Comments: The patient did not attend group this evening.    Archie Balboa S 11/25/2016, 11:35 PM

## 2016-11-25 NOTE — Progress Notes (Signed)
D: Pt presents as depressed. Cooperative with all meds.  A: Encouraged pt to verbalize feelings and attend all groups.  R: Pt did attend groups and compliant with all med. Contracts for safety.

## 2016-11-25 NOTE — BHH Group Notes (Signed)
[place] LCSW Group Therapy 11/25/2016 1:15 PM Type of Therapy: Group Therapy Participation Level: Active  Participation Quality: Attentive, Sharing and Supportive  Affect: Appropriate  Cognitive: Alert and Oriented  Insight: Developing/Improving and Engaged  Engagement in Therapy: Developing/Improving and Engaged  Modes of Intervention: Clarification, Confrontation, Discussion, Education, Exploration, Limit-setting, Orientation, Problem-solving, Rapport Building, Art therapist, Socialization and Support  Summary of Progress/Problems: The topic for today was establishing healthy boundaries in relationships. Pt processed their feelings related to boundary setting and was able to relate to peers. Pt discussed skills that can be used for establishing healthy boundaries and how boundaries relate to emotional wellness. Patient discussed having difficulty telling others "no" and expressed having porous boundaries.    Tilden Fossa, MSW, [place] Clinical Social Worker [facility] [phone]
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
## 2016-11-25 NOTE — Progress Notes (Signed)
Patient ID: [name], female   DOB: [birth year], 50 y.o.   MRN: [id number]  Pt currently presents with a flat affect and isolative behavior. Pt remains in the dayroom during group time but does not interact with peers. Pt reports that her goal is to "write a letter to someone I need to get out of my life." Pt reports that she did not have a great day.  Pt provided with medications per providers orders. Pt's labs and vitals were monitored throughout the night. Pt supported emotionally and encouraged to express concerns and questions. Pt educated on medications.  Pt's safety ensured with 15 minute and environmental checks. In no current distress. Pt currently denies SI/HI and A/V hallucinations. Pt verbally agrees to seek staff if SI/HI or A/VH occurs and to consult with staff before acting on any harmful thoughts. Will continue POC.

## 2016-11-26 LAB — LIPID PANEL
CHOL/HDL RATIO: 4.9 ratio
CHOLESTEROL: 193 mg/dL (ref 0–200)
HDL: 39 mg/dL — ABNORMAL LOW (ref >40)
LDL CALC: 95 mg/dL (ref 0–99)
Triglycerides: 293 mg/dL — ABNORMAL HIGH (ref <150)
VLDL: 59 mg/dL — AB (ref 0–40)

## 2016-11-26 MED ORDER — CLONAZEPAM 0.5 MG PO TABS: 0.5000 mg | ORAL_TABLET | Freq: Every day | ORAL | Status: DC

## 2016-11-26 MED ORDER — CLONAZEPAM 0.5 MG PO TABS
0.5000 mg | ORAL_TABLET | ORAL | Status: DC
  Administered 2016-11-26 – 2016-11-27 (×4): 0.5 mg via ORAL
  Filled 2016-11-26 (×4): qty 1

## 2016-11-26 MED ORDER — BUSPIRONE HCL 10 MG PO TABS
10.0000 mg | ORAL_TABLET | ORAL | Status: DC
  Administered 2016-11-26 – 2016-11-27 (×4): 10 mg via ORAL
  Filled 2016-11-26 (×3): qty 1
  Filled 2016-11-26: qty 2
  Filled 2016-11-26 (×6): qty 1
  Filled 2016-11-26: qty 2

## 2016-11-26 MED ORDER — BUSPIRONE HCL 10 MG PO TABS: 10.0000 mg | ORAL_TABLET | Freq: Every day | ORAL | Status: DC

## 2016-11-26 NOTE — Plan of Care (Signed)
Problem: Coping: Goal: Ability to cope will improve Outcome: Progressing Pt encouraged to attend groups and participate in treatment.

## 2016-11-26 NOTE — Progress Notes (Signed)
Recreation Therapy Notes  Animal-Assisted Activity (AAA) Program Checklist/Progress Notes Patient Eligibility Criteria Checklist & Daily Group note for Rec TxIntervention  Date: 11.28.2017 Time: 2:45pm Location: [address]    AAA/T Program Assumption of Risk Form signed by Patient/ or Parent Legal Guardian Yes  Patient is free of allergies or sever asthma Yes  Patient reports no fear of animals Yes  Patient reports no history of cruelty to animals Yes  Patient understands his/her participation is voluntary Yes  Patient washes hands before animal contact Yes  Patient washes hands after animal contact Yes  Behavioral Response: Appropriate   Education:Hand Washing, Appropriate Animal Interaction   Education Outcome: Acknowledges education.   Clinical Observations/Feedback: Patient attended session and interacted appropriately with therapy dog and peers.    Laureen Ochs Sheriann Newmann, LRT/CTRS         Clancey Welton L 11/26/2016 3:04 PM
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
## 2016-11-26 NOTE — BHH Group Notes (Signed)
[place] LCSW Group Therapy 11/26/2016  1:15 PM   Type of Therapy: Group Therapy  Participation Level: Did Not Attend. Patient invited to participate but declined.   Tilden Fossa, MSW, [place] Clinical Social Worker [facility] [phone]

## 2016-11-26 NOTE — Progress Notes (Signed)
D: Pt presents sullen and depressed. Speaks in a quiet voice and verbalized feelings of wanting to feel better but unable to get motivated. Pt slept most of the day sleeping on and off.  A: Encouraged pt to attend groups for peer support and to learn coping skills when dealing with anxiety and depression while hospitalized but also to seek outside support once discharged.   R: Pt verbalized understanding. Contracted for safety and denies SI/HI/AVH.

## 2016-11-27 ENCOUNTER — Ambulatory Visit (HOSPITAL_COMMUNITY): Payer: Self-pay | Admitting: Psychiatry

## 2016-11-27 LAB — HEMOGLOBIN A1C
Hgb A1c MFr Bld: 6.9 % — ABNORMAL HIGH (ref 4.8–5.6)
MEAN PLASMA GLUCOSE: 151 mg/dL

## 2016-11-27 MED ORDER — BUSPIRONE HCL 10 MG PO TABS: 10.0000 mg | ORAL_TABLET | Freq: Three times a day (TID) | ORAL | 0 refills | Status: DC

## 2016-11-27 MED ORDER — METOPROLOL TARTRATE 25 MG PO TABS: 25.0000 mg | ORAL_TABLET | Freq: Two times a day (BID) | ORAL | Status: DC

## 2016-11-27 MED ORDER — CANAGLIFLOZIN 100 MG PO TABS: 100.0000 mg | ORAL_TABLET | Freq: Every day | ORAL | Status: DC

## 2016-11-27 MED ORDER — METFORMIN HCL ER 500 MG PO TB24: 500.0000 mg | ORAL_TABLET | Freq: Every day | ORAL | Status: DC

## 2016-11-27 MED ORDER — VENLAFAXINE HCL ER 37.5 MG PO CP24
37.5000 mg | ORAL_CAPSULE | Freq: Every day | ORAL | Status: DC
  Filled 2016-11-27 (×2): qty 1

## 2016-11-27 MED ORDER — CLONAZEPAM 0.5 MG PO TABS: 0.5000 mg | ORAL_TABLET | Freq: Three times a day (TID) | ORAL | 0 refills | Status: DC

## 2016-11-27 MED ORDER — HYDROXYZINE HCL 25 MG PO TABS: 25.0000 mg | ORAL_TABLET | Freq: Four times a day (QID) | ORAL | 0 refills | Status: DC | PRN

## 2016-11-27 MED ORDER — VILAZODONE HCL 40 MG PO TABS: 40.0000 mg | ORAL_TABLET | Freq: Every day | ORAL | 0 refills | Status: DC

## 2016-11-27 MED ORDER — ESTRADIOL 2 MG PO TABS: 2.0000 mg | ORAL_TABLET | Freq: Every day | ORAL | Status: DC

## 2016-11-27 MED ORDER — PRAVASTATIN SODIUM 40 MG PO TABS: 40.0000 mg | ORAL_TABLET | Freq: Every day | ORAL | 0 refills | Status: DC

## 2016-11-27 MED ORDER — ARIPIPRAZOLE 2 MG PO TABS: 2.0000 mg | ORAL_TABLET | Freq: Every day | ORAL | 0 refills | Status: DC

## 2016-11-27 MED ORDER — VENLAFAXINE HCL ER 37.5 MG PO CP24: 37.5000 mg | ORAL_CAPSULE | Freq: Every day | ORAL | Status: DC

## 2016-11-27 MED ORDER — CANAGLIFLOZIN 100 MG PO TABS: 200.0000 mg | ORAL_TABLET | Freq: Every day | ORAL | Status: DC

## 2016-11-27 MED ORDER — LISINOPRIL-HYDROCHLOROTHIAZIDE 20-25 MG PO TABS: 1.0000 | ORAL_TABLET | Freq: Every day | ORAL | Status: DC

## 2016-11-27 NOTE — Progress Notes (Signed)
Recreation Therapy Notes  Date: 11/27/16 Time: 0930 Location: 300 Hall Dayroom  Group Topic: Stress Management  Goal Area(s) Addresses:  Patient will verbalize importance of using healthy stress management.  Patient will identify positive emotions associated with healthy stress management.   Behavioral Response: Engaged  Intervention: Stress Management  Activity :  Progressive Muscle Relaxation.  LRT introduced the stress management concept of progressive muscle relaxation.  LRT read a script to guide the patients through the activity.  Patients were to follow along as LRT read script to fully engage in group.  Education:  Stress Management, Discharge Planning.   Education Outcome: Acknowledges edcuation/In group clarification offered/Needs additional education  Clinical Observations/Feedback: Pt attended group.   Victorino Sparrow, LRT/CTRS         Victorino Sparrow A 11/27/2016 11:35 AM

## 2016-11-27 NOTE — BHH Suicide Risk Assessment (Signed)
[place] INPATIENT:  Family/Significant Other Suicide Prevention Education  Suicide Prevention Education:  Patient Refusal for Family/Significant Other Suicide Prevention Education: The patient [name] has refused to provide written consent for family/significant other to be provided Family/Significant Other Suicide Prevention Education during admission and/or prior to discharge.  Physician notified. SPE reviewed with patient and brochure provided. Patient encouraged to return to hospital if having suicidal thoughts, patient verbalized his/her understanding and has no further questions at this time.   Temperance Kelemen L Aimy Sweeting 11/27/2016, 11:05 AM
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
## 2016-11-27 NOTE — Progress Notes (Signed)
D: Patient spent most shift on bed. Continues to endorse depression rated 5/10. Denies pain, SI/HI, AH/VH at this time. No behavioral issues noted. A: Staff encouraged patient to participate in group and be out on day room. Offered bed time as ordered. Routine safety checks maintained. Will continue to monitor patient. R: Patient remains safe.

## 2016-11-27 NOTE — Progress Notes (Signed)
Pt d/c from the hospital with a friend. All items returned. D/C instructions given and prescriptions given. Pt denies si and hi.

## 2016-11-27 NOTE — Progress Notes (Signed)
  [facility] Adult Case Management Discharge Plan :  Will you be returning to the same living situation after discharge:  Yes,  patient plans to return home At discharge, do you have transportation home?: Yes,  friend to pick up Do you have the ability to pay for your medications: Yes,  patient will be provided with prescriptions at discharge  Release of information consent forms completed and in the chart;  Patient's signature needed at discharge.  Patient to Follow up at: Follow-up Information    [facility] Follow up.   Why:  Therapy appts with Vickii Chafe on Weds Dec. 13th at 11am. Medication management appt with Dr. Harrington Challenger on Thursday December 14th at 11:15am & on January 3rd at 8:15am. Call if you need to reschedule.  Contact information: [id number] [address] [place], [place] [address]  [phone]           Next level of care provider has access to [facility] Link:Yes  Safety Planning and Suicide Prevention discussed: Yes,  with patient  Have you used any form of tobacco in the last 30 days? (Cigarettes, Smokeless Tobacco, Cigars, and/or Pipes): No  Has patient been referred to the Quitline?: N/A patient is not a smoker  Patient has been referred for addiction treatment: Yes  [name] [name] 11/27/2016, 11:04 AM
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
## 2016-11-27 NOTE — Tx Team (Signed)
Interdisciplinary Treatment and Diagnostic Plan Update  11/27/2016 Time of Session: 9:30am [name] MRN: [id number]  Principal Diagnosis: MDD (major depressive disorder), recurrent severe, without psychosis ([name])  Secondary Diagnoses: Principal Problem:   MDD (major depressive disorder), recurrent severe, without psychosis ([name]) Active Problems:   Chronic traumatic encephalopathy   Opioid type dependence ([name])   Alcohol dependence ([name])   Current Medications:  Current Facility-Administered Medications  Medication Dose Route Frequency Provider Last Rate Last Dose  . acetaminophen (TYLENOL) tablet 650 mg  650 mg Oral Q6H PRN Ethelene Hal, NP      . alum & mag hydroxide-simeth (MAALOX/MYLANTA) 200-200-20 MG/5ML suspension 30 mL  30 mL Oral Q4H PRN Ethelene Hal, NP      . ARIPiprazole (ABILIFY) tablet 2 mg  2 mg Oral Daily Ethelene Hal, NP   2 mg at 11/27/16 0806  . busPIRone (BUSPAR) tablet 10 mg  10 mg Oral 3 times per day Linard Millers, MD   10 mg at 11/27/16 [phone]  . canagliflozin (INVOKANA) tablet 100 mg  100 mg Oral Q breakfast Ethelene Hal, NP   100 mg at 11/27/16 [id number]   And  . metFORMIN (GLUCOPHAGE-XR) 24 hr tablet 500 mg  500 mg Oral Q breakfast Ethelene Hal, NP   500 mg at 11/27/16 [id number]  . canagliflozin (INVOKANA) tablet 200 mg  200 mg Oral Q supper Ethelene Hal, NP   200 mg at 11/26/16 1651   And  . metFORMIN (GLUCOPHAGE-XR) 24 hr tablet 500 mg  500 mg Oral Q supper Ethelene Hal, NP   500 mg at 11/26/16 1652  . clonazePAM (KLONOPIN) tablet 0.5 mg  0.5 mg Oral 3 times per day Linard Millers, MD   0.5 mg at 11/27/16 [id number]  . estradiol (ESTRACE) tablet 2 mg  2 mg Oral Daily Ethelene Hal, NP   2 mg at 11/27/16 [id number]  . lisinopril (PRINIVIL,ZESTRIL) tablet 20 mg  20 mg Oral Daily Ethelene Hal, NP   20 mg at 11/27/16 [id number]   And  . hydrochlorothiazide (HYDRODIURIL) tablet 25 mg  25 mg Oral Daily  Ethelene Hal, NP   25 mg at 11/27/16 [id number]  . hydrOXYzine (ATARAX/VISTARIL) tablet 25 mg  25 mg Oral Q6H PRN Ethelene Hal, NP   25 mg at 11/24/16 2214  . linagliptin (TRADJENTA) tablet 5 mg  5 mg Oral Daily Ethelene Hal, NP   5 mg at 11/27/16 0807  . magnesium hydroxide (MILK OF MAGNESIA) suspension 30 mL  30 mL Oral Daily PRN Ethelene Hal, NP      . metoprolol tartrate (LOPRESSOR) tablet 25 mg  25 mg Oral BID Ethelene Hal, NP   25 mg at 11/27/16 0810  . pravastatin (PRAVACHOL) tablet 40 mg  40 mg Oral Daily Ethelene Hal, NP   40 mg at 11/27/16 0807  . [START ON 11/28/2016] venlafaxine XR (EFFEXOR-XR) 24 hr capsule 37.5 mg  37.5 mg Oral Q breakfast Linard Millers, MD      . Vilazodone HCl (VIIBRYD) TABS 40 mg  40 mg Oral Daily Ethelene Hal, NP   40 mg at 11/27/16 [id number]   PTA Medications: Prescriptions Prior to Admission  Medication Sig Dispense Refill Last Dose  . ARIPiprazole (ABILIFY) 2 MG tablet Take 1 tablet (2 mg total) by mouth daily. 30 tablet 2 Past Week at Unknown time  . busPIRone (BUSPAR) 10 MG tablet Take 1 tablet (  10 mg total) by mouth 3 (three) times daily. 90 tablet 2 Past Week at Unknown time  . clonazePAM (KLONOPIN) 0.5 MG tablet Take 1 tablet (0.5 mg total) by mouth 3 (three) times daily. 90 tablet 2 Past Week at Unknown time  . estradiol (ESTRACE) 2 MG tablet take 1 tablet by mouth once daily 30 tablet 11 Past Week at Unknown time  . INVOKAMET XR 150-500 MG TB24 Take 1 tablet by mouth 2 (two) times daily.   Past Week at Unknown time  . linagliptin (TRADJENTA) 5 MG TABS tablet Take 1 tablet (5 mg total) by mouth daily. (Patient taking differently: Take 5 mg by mouth at bedtime. ) 30 tablet 0 Past Week at Unknown time  . lisinopril-hydrochlorothiazide (PRINZIDE,ZESTORETIC) 20-25 MG tablet Take 1 tablet by mouth daily.   Past Week at Unknown time  . Magnesium 250 MG TABS Take 1 tablet by mouth at bedtime.   Past Week at  Unknown time  . medroxyPROGESTERone (PROVERA) 2.5 MG tablet Take 1 tablet (2.5 mg total) by mouth daily. 30 tablet 0 Past Week at Unknown time  . metoprolol tartrate (LOPRESSOR) 25 MG tablet Take 1 tablet (25 mg total) by mouth 2 (two) times daily. 30 tablet 0 Past Week at Unknown time  . pravastatin (PRAVACHOL) 40 MG tablet Take 1 tablet (40 mg total) by mouth daily. 30 tablet 0 Past Week at Unknown time  . venlafaxine XR (EFFEXOR-XR) 75 MG 24 hr capsule Take 1 capsule (75 mg total) by mouth daily with breakfast. 30 capsule 2 Past Week at Unknown time  . Vilazodone HCl (VIIBRYD) 40 MG TABS Take 1 tablet (40 mg total) by mouth daily. 30 tablet 2 Past Week at Unknown time    Patient Stressors: Financial difficulties Health problems  Patient Strengths: Ability for insight General fund of knowledge  Treatment Modalities: Medication Management, Group therapy, Case management,  1 to 1 session with clinician, Psychoeducation, Recreational therapy.   Physician Treatment Plan for Primary Diagnosis: MDD (major depressive disorder), recurrent severe, without psychosis ([name]) Long Term Goal(s): Improvement in symptoms so as ready for discharge Improvement in symptoms so as ready for discharge   Short Term Goals: Ability to disclose and discuss suicidal ideas Ability to identify changes in lifestyle to reduce recurrence of condition will improve Ability to maintain clinical measurements within normal limits will improve  Medication Management: Evaluate patient's response, side effects, and tolerance of medication regimen.  Therapeutic Interventions: 1 to 1 sessions, Unit Group sessions and Medication administration.  Evaluation of Outcomes: Adequate for Discharge per MD  Physician Treatment Plan for Secondary Diagnosis: Principal Problem:   MDD (major depressive disorder), recurrent severe, without psychosis ([name]) Active Problems:   Chronic traumatic encephalopathy   Opioid type dependence  ([name])   Alcohol dependence ([name])  Long Term Goal(s): Improvement in symptoms so as ready for discharge Improvement in symptoms so as ready for discharge   Short Term Goals: Ability to disclose and discuss suicidal ideas Ability to identify changes in lifestyle to reduce recurrence of condition will improve Ability to maintain clinical measurements within normal limits will improve     Medication Management: Evaluate patient's response, side effects, and tolerance of medication regimen.  Therapeutic Interventions: 1 to 1 sessions, Unit Group sessions and Medication administration.  Evaluation of Outcomes: Adequate for Discharge per MD   RN Treatment Plan for Primary Diagnosis: MDD (major depressive disorder), recurrent severe, without psychosis ([place]) Long Term Goal(s): Knowledge of disease and therapeutic regimen to maintain  health will improve  Short Term Goals: Ability to remain free from injury will improve, Ability to disclose and discuss suicidal ideas, Ability to identify and develop effective coping behaviors will improve and Compliance with prescribed medications will improve  Medication Management: RN will administer medications as ordered by provider, will assess and evaluate patient's response and provide education to patient for prescribed medication. RN will report any adverse and/or side effects to prescribing provider.  Therapeutic Interventions: 1 on 1 counseling sessions, Psychoeducation, Medication administration, Evaluate responses to treatment, Monitor vital signs and CBGs as ordered, Perform/monitor CIWA, COWS, AIMS and Fall Risk screenings as ordered, Perform wound care treatments as ordered.  Evaluation of Outcomes: Adequate for Discharge per MD   LCSW Treatment Plan for Primary Diagnosis: MDD (major depressive disorder), recurrent severe, without psychosis ([place]) Long Term Goal(s): Safe transition to appropriate next level of care at discharge, Engage patient in  therapeutic group addressing interpersonal concerns.  Short Term Goals: Engage patient in aftercare planning with referrals and resources, Increase social support, Increase emotional regulation, Identify triggers associated with mental health/substance abuse issues and Increase skills for wellness and recovery  Therapeutic Interventions: Assess for all discharge needs, 1 to 1 time with Social worker, Explore available resources and support systems, Assess for adequacy in community support network, Educate family and significant other(s) on suicide prevention, Complete Psychosocial Assessment, Interpersonal group therapy.  Evaluation of Outcomes: Adequate for Discharge per MD   Progress in Treatment :  Attending groups: Continuing to assess  Participating in groups: Continuing to assess  Taking medication as prescribed: Yes, MD continuing to assess for appropriate medication regimen  Toleration medication: Yes  Family/Significant other contact made: No, patient has declined for collateral contact   Patient understands diagnosis: Yes  Discussing patient identified problems/goals with staff: Yes  Medical problems stabilized or resolved: Yes  Denies suicidal/homicidal ideation: Patient endorses chronic passive SI at baseline  Issues/concerns per patient self-inventory: None reported  Other: N/A  New problem(s) identified: None reported at this time    New Short Term/Long Term Goal(s): None at this time    Discharge Plan or Barriers: Patient will return home to follow up with current outpatient provider at [facility] in [place].    Reason for Continuation of Hospitalization: Anxiety Depression Medication stabilization Suicidal Ideations Withdrawal symptoms  Estimated Length of Stay: Discharge anticipated for today 11/27/16    Attendees:  Patient:   Physician: Dr. Parke Poisson, Dr. Sharolyn Douglas, MD  11/27/2016   9:30am  Nursing: Leanne Lovely, Michaele Offer, RN  11/27/2016 9:30am  RN Care Manager: Lars Pinks, CM  11/27/2016 9:30am  Social Workers: Peri Maris, LCSW, Tilden Fossa, LCSW, [place], LCSW   11/27/2016 9:30am  Nurse Pratictioners: Agustina Caroli, Samuel Jester, NP 11/27/2016 9:30am  Other:      Scribe for Treatment Team: Tilden Fossa, [place] Worker [facility] [phone]

## 2016-12-02 ENCOUNTER — Other Ambulatory Visit (HOSPITAL_COMMUNITY): Payer: Self-pay | Admitting: Psychiatry

## 2016-12-02 ENCOUNTER — Other Ambulatory Visit: Payer: Self-pay | Admitting: Obstetrics & Gynecology

## 2016-12-06 ENCOUNTER — Ambulatory Visit (INDEPENDENT_AMBULATORY_CARE_PROVIDER_SITE_OTHER): Payer: Medicare Other | Admitting: Psychiatry

## 2016-12-06 ENCOUNTER — Encounter (HOSPITAL_COMMUNITY): Payer: Self-pay | Admitting: Psychiatry

## 2016-12-06 DIAGNOSIS — F332 Major depressive disorder, recurrent severe without psychotic features: Secondary | ICD-10-CM

## 2016-12-06 NOTE — Progress Notes (Signed)
   THERAPIST PROGRESS NOTE  Session Time:  Friday 12/06/2016 9:20 AM -10:05 AM   Participation Level: Active  Behavioral Response: less depressed/anxious            Type of Therapy: Individual Therapy   Treatment Goals :     1 .Identify replace thoughts that support depression        2. 1ncrease self acceptance decreasing the negative statements about self and increasing positive statements about self       3. Improve ability to manage stress and anxiety with decreased intensity and frequency as of anxiety response (panic attacks, avoidance)       4. Improve interpersonal skills  Treatment Goals addressed  1,4   Interventions: CBT and Supportive,ACT  Summary: [name] is a 50 y.o. female who presents with a long-standing history of chronic recurrent severe depressive symptoms accompanied by chronic anxiety. She has had multiple hospitalizations due to to suicidal attempts and depression. Her current symptoms include depressed mood, anxiety, excessive worrying, panic attacks, and passive suicidal ideations.   Patient last was seen about 3 weeks ago. She reports going to ER about two weeks ago due to suicidal ideations. She was hospitalized at [facility] from 11/26 -11/29. This was triggered by a friend becoming angry with patient and ending the relationship. She reports continuing to have passive fleeting ideations since discharge with no intent and no plan. She has engaged in more activities and has used support from other friends to cope. She continues to have feelings for friend and expresses sadness about the loss. She also is still involved with a female friend and reports stress regarding the relationship as he wants to be more than friends. She also reports stress related to parenting daughter and reports feeling overwhelmed at times. She is working with Insurance claims handler.    Suicidal/Homicidal: No. Patient agrees to call this practice, call 911, or have someone take her to the  emergency room should symptoms worsen.   Therapist Response:  Reviewed symptoms, facilitated expression of feelings, reviewed coping techniques and ways to improve distress tolerance skills, discussed ways to set/maintain/ and respect boundaries in relationships, assigned patient to review handouts on resisting suicidal thoughts and feelings developed in previous session     .Plan: Patient agrees to return for an appointment in 1 week.  Diagnosis: Axis I: MDD, Recurrent    Axis II: Borderline Personality Dis.    BYNUM,PEGGY, LCSW

## 2016-12-11 ENCOUNTER — Ambulatory Visit (HOSPITAL_COMMUNITY): Payer: Self-pay | Admitting: Psychiatry

## 2016-12-12 ENCOUNTER — Encounter (HOSPITAL_COMMUNITY): Payer: Self-pay | Admitting: Psychiatry

## 2016-12-12 ENCOUNTER — Ambulatory Visit (INDEPENDENT_AMBULATORY_CARE_PROVIDER_SITE_OTHER): Payer: Medicare Other | Admitting: Psychiatry

## 2016-12-12 VITALS — BP 142/73 | HR 101 | Ht 65.0 in | Wt 205.8 lb

## 2016-12-12 DIAGNOSIS — F332 Major depressive disorder, recurrent severe without psychotic features: Secondary | ICD-10-CM | POA: Diagnosis not present

## 2016-12-12 DIAGNOSIS — Z79899 Other long term (current) drug therapy: Secondary | ICD-10-CM | POA: Diagnosis not present

## 2016-12-12 MED ORDER — HYDROXYZINE HCL 25 MG PO TABS: 25.0000 mg | ORAL_TABLET | Freq: Four times a day (QID) | ORAL | 2 refills | Status: DC | PRN

## 2016-12-12 MED ORDER — BUSPIRONE HCL 10 MG PO TABS: 10.0000 mg | ORAL_TABLET | Freq: Three times a day (TID) | ORAL | 2 refills | Status: DC

## 2016-12-12 MED ORDER — ARIPIPRAZOLE 2 MG PO TABS: 2.0000 mg | ORAL_TABLET | Freq: Every day | ORAL | 2 refills | Status: DC

## 2016-12-12 MED ORDER — CLONAZEPAM 0.5 MG PO TABS: 0.5000 mg | ORAL_TABLET | Freq: Three times a day (TID) | ORAL | 2 refills | Status: DC

## 2016-12-12 MED ORDER — VILAZODONE HCL 40 MG PO TABS: 40.0000 mg | ORAL_TABLET | Freq: Every day | ORAL | 2 refills | Status: DC
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
## 2016-12-15 ENCOUNTER — Other Ambulatory Visit (HOSPITAL_COMMUNITY): Payer: Self-pay | Admitting: Psychiatry

## 2016-12-20 ENCOUNTER — Ambulatory Visit (INDEPENDENT_AMBULATORY_CARE_PROVIDER_SITE_OTHER): Payer: Medicare Other | Admitting: Psychiatry

## 2016-12-20 ENCOUNTER — Encounter (HOSPITAL_COMMUNITY): Payer: Self-pay | Admitting: Psychiatry

## 2016-12-20 DIAGNOSIS — F332 Major depressive disorder, recurrent severe without psychotic features: Secondary | ICD-10-CM

## 2016-12-20 NOTE — Progress Notes (Signed)
   THERAPIST PROGRESS NOTE  Session Time:  Friday 12/20/2016 9:00 AM -  9:50 AM                            Participation Level: Active  Behavioral Response:  depressed/anxious            Type of Therapy: Individual Therapy   Treatment Goals :     1 .Identify replace thoughts that support depression        2. 1ncrease self acceptance decreasing the negative statements about self and increasing positive statements about self       3. Improve ability to manage stress and anxiety with decreased intensity and frequency as of anxiety response (panic attacks, avoidance)       4. Improve interpersonal skills  Treatment Goals addressed  1,2, 3   Interventions: CBT and Supportive,ACT  Summary: [name] is a 50 y.o. female who presents with a long-standing history of chronic recurrent severe depressive symptoms accompanied by chronic anxiety. She has had multiple hospitalizations due to to suicidal attempts and depression. Her current symptoms include depressed mood, anxiety, excessive worrying, panic attacks, and passive suicidal ideations.   Patient last was seen about 2 weeks ago. She states being more moody, irritable, frustrated, depressed and withdrawn since last session. She says this is the first day she has dressed and left home in the past 5 days. She also has been eating and spending excessively. Patient reports having fleeting suicidal ideations last night but was able to use d healthy coping skills.  She continues to experience sadness regarding loss of relationship with her friend. She also expresses worry and frustration about her 26 year old son who is not managing self-care and steps to go to college in the ways patient desires. She worries about daughter who is not doing well in school. Patient remains involved with her female friend but states she feels like she is settling as others think she should remain involved with him.  Patient expresses frustration she continues to  experience panic attacks and anxiety,   Suicidal/Homicidal: No. Patient agrees to call this practice, call 911, or have someone take her to the emergency room should symptoms worsen.   Therapist Response:  Reviewed symptoms, facilitated expression of feelings, reviewed coping techniques and ways to improve distress tolerance skills,assigned patient to review handouts on resisting suicidal thoughts and feelings developed in previous session , assisted patient identify ways to develop daily schedule to increase involvement in activity, discussed connection between thoughts, mood, and behavior, assisted patient identify coping statements to cope with having anxiety,  assigned patient to journal   .Plan: Patient agrees to return for an appointment in 2 week.  Diagnosis: Axis I: MDD, Recurrent    Axis II: Borderline Personality Dis.    Tarance Balan, LCSW

## 2017-01-01 ENCOUNTER — Encounter (HOSPITAL_COMMUNITY): Payer: Self-pay | Admitting: Psychiatry

## 2017-01-01 ENCOUNTER — Ambulatory Visit (INDEPENDENT_AMBULATORY_CARE_PROVIDER_SITE_OTHER): Payer: Medicare Other | Admitting: Psychiatry

## 2017-01-01 VITALS — BP 134/79 | HR 85 | Ht 65.0 in | Wt 208.8 lb

## 2017-01-01 DIAGNOSIS — F332 Major depressive disorder, recurrent severe without psychotic features: Secondary | ICD-10-CM | POA: Diagnosis not present

## 2017-01-01 DIAGNOSIS — Z79899 Other long term (current) drug therapy: Secondary | ICD-10-CM | POA: Diagnosis not present

## 2017-01-01 MED ORDER — BUSPIRONE HCL 10 MG PO TABS: 10.0000 mg | ORAL_TABLET | Freq: Three times a day (TID) | ORAL | 2 refills | Status: DC

## 2017-01-01 MED ORDER — HYDROXYZINE HCL 25 MG PO TABS: 25.0000 mg | ORAL_TABLET | Freq: Four times a day (QID) | ORAL | 2 refills | Status: DC | PRN

## 2017-01-01 MED ORDER — ARIPIPRAZOLE 2 MG PO TABS: 2.0000 mg | ORAL_TABLET | Freq: Every day | ORAL | 2 refills | Status: DC

## 2017-01-01 MED ORDER — VILAZODONE HCL 40 MG PO TABS: 40.0000 mg | ORAL_TABLET | Freq: Every day | ORAL | 2 refills | Status: DC
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
## 2017-01-03 ENCOUNTER — Encounter (HOSPITAL_COMMUNITY): Payer: Self-pay | Admitting: Psychiatry

## 2017-01-03 ENCOUNTER — Ambulatory Visit (INDEPENDENT_AMBULATORY_CARE_PROVIDER_SITE_OTHER): Payer: Medicare Other | Admitting: Psychiatry

## 2017-01-03 DIAGNOSIS — F332 Major depressive disorder, recurrent severe without psychotic features: Secondary | ICD-10-CM

## 2017-01-03 NOTE — Progress Notes (Signed)
   THERAPIST PROGRESS NOTE  Session Time:  Friday 01/03/2017 8:15 AM -  9:00 AM                            Participation Level: Active  Behavioral Response:  depressed/            Type of Therapy: Individual Therapy   Treatment Goals :     1 .Identify replace thoughts that support depression        2. 1ncrease self acceptance decreasing the negative statements about self and increasing positive statements about self       3. Improve ability to manage stress and anxiety with decreased intensity and frequency as of anxiety response (panic attacks, avoidance)       4. Improve interpersonal skills  Treatment Goals addressed  1,2, 3,4   Interventions: CBT and Supportive,ACT  Summary: [name] is a 51 y.o. female who presents with a long-standing history of chronic recurrent severe depressive symptoms accompanied by chronic anxiety. She has had multiple hospitalizations due to to suicidal attempts and depression. Her current symptoms include depressed mood, anxiety, excessive worrying, panic attacks, and passive suicidal ideations.   Patient last was seen about 2 weeks ago. She reports she had been doing well until a few days ago when she started having more thoughts and experiencing sadness about losing relationship with her friend. She says she started texting the friend although she has made it clear she wants no contact from patient. She reports she then started calling friend who hangs up the phone. Patient states becoming more depressed. She says she can't help herself and just wants friend to listen to her side of the story that caused their conflict. She also has asked one of her mutual friends to talk to the friend on patient's behalf.  Patient denies any suicidal ideations.  Suicidal/Homicidal: No.  Therapist Response:  Reviewed symptoms, facilitated expression of feelings, discussed interpersonal issues and boundary issues, discussed the importance of respecting friend's  boundaries by avoiding contacting friend, assisted patient identify situations, thoughts,  and feelings precipitating her desire to contact friend, assisted patient identify benefits of not contacting friend, identify alternative activities,   .Plan: Patient agrees to return for an appointment in 2 weeks.  Diagnosis: Axis I: MDD, Recurrent    Axis II: Borderline Personality Dis.    Toussaint Golson, LCSW

## 2017-01-14 ENCOUNTER — Other Ambulatory Visit (HOSPITAL_COMMUNITY): Payer: Self-pay | Admitting: Psychiatry

## 2017-01-14 ENCOUNTER — Other Ambulatory Visit: Payer: Self-pay | Admitting: Obstetrics & Gynecology

## 2017-01-17 ENCOUNTER — Ambulatory Visit (HOSPITAL_COMMUNITY): Payer: Self-pay | Admitting: Psychiatry

## 2017-01-21 DIAGNOSIS — E119 Type 2 diabetes mellitus without complications: Secondary | ICD-10-CM | POA: Diagnosis not present

## 2017-01-21 DIAGNOSIS — I10 Essential (primary) hypertension: Secondary | ICD-10-CM | POA: Diagnosis not present

## 2017-01-21 DIAGNOSIS — E782 Mixed hyperlipidemia: Secondary | ICD-10-CM | POA: Diagnosis not present

## 2017-01-23 DIAGNOSIS — E119 Type 2 diabetes mellitus without complications: Secondary | ICD-10-CM | POA: Diagnosis not present

## 2017-01-23 DIAGNOSIS — Z6836 Body mass index (BMI) 36.0-36.9, adult: Secondary | ICD-10-CM | POA: Diagnosis not present

## 2017-01-23 DIAGNOSIS — G43001 Migraine without aura, not intractable, with status migrainosus: Secondary | ICD-10-CM | POA: Diagnosis not present

## 2017-01-23 DIAGNOSIS — F319 Bipolar disorder, unspecified: Secondary | ICD-10-CM | POA: Diagnosis not present

## 2017-01-23 DIAGNOSIS — R945 Abnormal results of liver function studies: Secondary | ICD-10-CM | POA: Diagnosis not present

## 2017-01-23 DIAGNOSIS — I10 Essential (primary) hypertension: Secondary | ICD-10-CM | POA: Diagnosis not present

## 2017-01-23 DIAGNOSIS — E782 Mixed hyperlipidemia: Secondary | ICD-10-CM | POA: Diagnosis not present

## 2017-01-27 ENCOUNTER — Ambulatory Visit (INDEPENDENT_AMBULATORY_CARE_PROVIDER_SITE_OTHER): Payer: Medicare Other | Admitting: Psychiatry

## 2017-01-27 ENCOUNTER — Encounter (HOSPITAL_COMMUNITY): Payer: Self-pay | Admitting: Psychiatry

## 2017-01-27 DIAGNOSIS — F332 Major depressive disorder, recurrent severe without psychotic features: Secondary | ICD-10-CM
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
## 2017-01-29 ENCOUNTER — Ambulatory Visit (INDEPENDENT_AMBULATORY_CARE_PROVIDER_SITE_OTHER): Payer: Medicare Other | Admitting: Psychiatry

## 2017-01-29 ENCOUNTER — Encounter (HOSPITAL_COMMUNITY): Payer: Self-pay | Admitting: Psychiatry

## 2017-01-29 VITALS — BP 123/81 | HR 90 | Ht 65.0 in | Wt 209.4 lb

## 2017-01-29 DIAGNOSIS — Z79899 Other long term (current) drug therapy: Secondary | ICD-10-CM

## 2017-01-29 DIAGNOSIS — F332 Major depressive disorder, recurrent severe without psychotic features: Secondary | ICD-10-CM

## 2017-01-29 MED ORDER — CLONAZEPAM 0.5 MG PO TABS: 0.5000 mg | ORAL_TABLET | Freq: Three times a day (TID) | ORAL | 2 refills | Status: DC

## 2017-01-29 MED ORDER — BUSPIRONE HCL 10 MG PO TABS: 10.0000 mg | ORAL_TABLET | Freq: Three times a day (TID) | ORAL | 2 refills | Status: DC

## 2017-01-29 MED ORDER — VILAZODONE HCL 40 MG PO TABS: 40.0000 mg | ORAL_TABLET | Freq: Every day | ORAL | 2 refills | Status: DC

## 2017-01-29 MED ORDER — HYDROXYZINE HCL 25 MG PO TABS: 25.0000 mg | ORAL_TABLET | Freq: Four times a day (QID) | ORAL | 2 refills | Status: DC | PRN

## 2017-01-29 MED ORDER — ARIPIPRAZOLE 2 MG PO TABS: 2.0000 mg | ORAL_TABLET | Freq: Every day | ORAL | 2 refills | Status: DC
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
## 2017-02-10 ENCOUNTER — Encounter (HOSPITAL_COMMUNITY): Payer: Self-pay | Admitting: Psychiatry

## 2017-02-10 ENCOUNTER — Ambulatory Visit (INDEPENDENT_AMBULATORY_CARE_PROVIDER_SITE_OTHER): Payer: Medicare Other | Admitting: Psychiatry

## 2017-02-10 DIAGNOSIS — F332 Major depressive disorder, recurrent severe without psychotic features: Secondary | ICD-10-CM | POA: Diagnosis not present

## 2017-02-10 NOTE — Progress Notes (Signed)
   THERAPIST PROGRESS NOTE  Session Time:  Monday 02/10/2017 10:11 AM -10:58 AM                                     Participation Level: Active  Behavioral Response:  Depressed/talkative,             Type of Therapy: Individual Therapy   Treatment Goals :     1 .Identify replace thoughts that support depression        2. 1ncrease self acceptance decreasing the negative statements about self and increasing positive statements about self       3. Improve ability to manage stress and anxiety with decreased intensity and frequency as of anxiety response (panic attacks, avoidance)       4. Improve interpersonal skills  Treatment Goals addressed  1,4   Interventions: CBT and Supportive,ACT  Summary: [name] is a 51 y.o. female who presents with a long-standing history of chronic recurrent severe depressive symptoms accompanied by chronic anxiety. She has had multiple hospitalizations due to to suicidal attempts and depression. Her current symptoms include depressed mood, anxiety, excessive worrying, panic attacks, and passive suicidal ideations.   Patient last was seen about 3 weeks ago. She reports depressed mood, poor motivation, excessive eating, and sleep difficulty since last session. She reports being stressed by the loss of her relationship with two of her friends and concerns about her daughter who is experiencing some behavioral issues. She  reports having fleeting suicidal ideations last week but used thoughts about being here for her daughter and initiating contact with a relative to cope. She denies any current suicidal ideations. She continues to see her boyfriend who has helped her stay involved in activities but patient reports sometimes feeling smothered. She reports poor self care.   Suicidal/Homicidal: No. She agrees to call this practice, call 911, or have someone take her to the emergency room should symptoms worsen. Patient also agrees to review handouts provided in  previous sessions regarding strategies to resist suicidal thoughts and feelings.  Therapist Response:  Reviewed symptoms, facilitated expression of feelings, encouraged patient to work with daughter's therapist regarding parenting/boundary issues in their relationship, assisted patient identify and replace negative thoughts that support depression, praised and reinforced patient initiating contact with cousin, assisted patient identify ways to use daily planning to increase behavioral activation and improve self care, assigned patient to complete planning forms and brig to next session  .Plan: Patient agrees to return for an appointment in 2 weeks.  Diagnosis: Axis I: MDD, Recurrent    Axis II: Borderline Personality Dis.    Estel Tonelli, LCSW

## 2017-02-24 ENCOUNTER — Ambulatory Visit (INDEPENDENT_AMBULATORY_CARE_PROVIDER_SITE_OTHER): Payer: Medicare Other | Admitting: Psychiatry

## 2017-02-24 DIAGNOSIS — F332 Major depressive disorder, recurrent severe without psychotic features: Secondary | ICD-10-CM | POA: Diagnosis not present

## 2017-02-24 NOTE — Progress Notes (Signed)
  THERAPIST PROGRESS NOTE  Session Time:  Monday 02/24/2017 10:16 AM -  10:59 AM                                     Participation Level: Active  Behavioral Response:  Depressed/talkative,             Type of Therapy: Individual Therapy   Treatment Goals :     1 .Identify replace thoughts that support depression        2. 1ncrease self acceptance decreasing the negative statements about self and increasing positive statements about self       3. Improve ability to manage stress and anxiety with decreased intensity and frequency as of anxiety response (panic attacks, avoidance)       4. Improve interpersonal skills  Treatment Goals addressed  1,2   Interventions: CBT and Supportive,ACT       Summary: [name] is a 51 y.o. female who presents with a long-standing history of chronic recurrent severe depressive symptoms accompanied by chronic anxiety. She has had multiple hospitalizations due to to suicidal attempts and depression. Her current symptoms include depressed mood, anxiety, excessive worrying, panic attacks, and passive suicidal ideations.   Patient last was seen about 2 weeks ago. She reports continued depressed mood. She reports poor motivation and inconsistent involvement in activities. She reports using daily planning for one week. She reports having fleeting passive SI with no intent and no plan about a week ago but again used thoughts about being here for her children to cope. She reports no current SI, She reports recently breaking up with boyfriend as she reports just not wanting to be in a relationship with him. She misses doing activities with him but reports being pleased with making her choices and standing up for self. She expresses continued sadness regarding loss of two other relationships  Suicidal/Homicidal: No. She agrees to call this practice, call 911, or have someone take her to the emergency room should symptoms worsen. Patient also agrees to review handouts  provided in previous sessions regarding strategies to resist suicidal thoughts and feelings, discussed patient's values and assisted patient identify activities consistent with her life values to increase behavioral activation.   Therapist Response:  Reviewed symptoms, facilitated expression of feelings, encouraged patient to work with daughter's therapist regarding parenting/boundary issues in their relationship, assisted patient identify and replace negative thoughts that support depression, discussed possibility of attending DBT group in [place],  discussed patient's values and assisted patient identify activities consistent with her life values to increase behavioral activation, assigned patient to complete planning forms and bring to next session. Patient is scheduled to see psychiatrist Dr. Harrington Challenger on 02/27/2016.   Marland KitchenPlan: Patient agrees to return for an appointment in 2 weeks.  Diagnosis: Axis I: MDD, Recurrent    Axis II: Borderline Personality Dis.    Mackenzie Ramone, LCSW

## 2017-02-26 ENCOUNTER — Ambulatory Visit (HOSPITAL_COMMUNITY): Payer: Medicare Other | Admitting: Psychiatry

## 2017-03-01 ENCOUNTER — Emergency Department (HOSPITAL_COMMUNITY)
Admission: EM | Admit: 2017-03-01 | Discharge: 2017-03-01 | Disposition: A | Discharge status: Other Acute Inpatient Hospital | Payer: Medicare Other | Attending: Emergency Medicine | Admitting: Emergency Medicine

## 2017-03-01 ENCOUNTER — Encounter (HOSPITAL_COMMUNITY): Payer: Self-pay | Admitting: *Deleted

## 2017-03-01 DIAGNOSIS — F32A Depression, unspecified: Secondary | ICD-10-CM

## 2017-03-01 DIAGNOSIS — E119 Type 2 diabetes mellitus without complications: Secondary | ICD-10-CM | POA: Diagnosis not present

## 2017-03-01 DIAGNOSIS — R45851 Suicidal ideations: Secondary | ICD-10-CM | POA: Diagnosis present

## 2017-03-01 DIAGNOSIS — F329 Major depressive disorder, single episode, unspecified: Secondary | ICD-10-CM | POA: Insufficient documentation

## 2017-03-01 DIAGNOSIS — Z5181 Encounter for therapeutic drug level monitoring: Secondary | ICD-10-CM | POA: Diagnosis not present

## 2017-03-01 DIAGNOSIS — I10 Essential (primary) hypertension: Secondary | ICD-10-CM | POA: Insufficient documentation

## 2017-03-01 LAB — COMPREHENSIVE METABOLIC PANEL
ALT: 52 U/L (ref 14–54)
ANION GAP: 13 (ref 5–15)
AST: 57 U/L — AB (ref 15–41)
Albumin: 4.1 g/dL (ref 3.5–5.0)
Alkaline Phosphatase: 93 U/L (ref 38–126)
BILIRUBIN TOTAL: 0.6 mg/dL (ref 0.3–1.2)
BUN: 14 mg/dL (ref 6–20)
CHLORIDE: 103 mmol/L (ref 101–111)
CO2: 22 mmol/L (ref 22–32)
Calcium: 9.5 mg/dL (ref 8.9–10.3)
Creatinine, Ser: 0.95 mg/dL (ref 0.44–1.00)
GFR calc Af Amer: >60 mL/min (ref >60)
GFR calc non Af Amer: >60 mL/min (ref >60)
GLUCOSE: 270 mg/dL — AB (ref 65–99)
POTASSIUM: 3.7 mmol/L (ref 3.5–5.1)
Sodium: 138 mmol/L (ref 135–145)
TOTAL PROTEIN: 7.5 g/dL (ref 6.5–8.1)

## 2017-03-01 LAB — CBC
HCT: 46.4 % — ABNORMAL HIGH (ref 36.0–46.0)
Hemoglobin: 15.5 g/dL — ABNORMAL HIGH (ref 12.0–15.0)
MCH: 27.3 pg (ref 26.0–34.0)
MCHC: 33.4 g/dL (ref 30.0–36.0)
MCV: 81.7 fL (ref 78.0–100.0)
PLATELETS: 238 10*3/uL (ref 150–400)
RBC: 5.68 MIL/uL — AB (ref 3.87–5.11)
RDW: 13.9 % (ref 11.5–15.5)
WBC: 11.3 10*3/uL — AB (ref 4.0–10.5)

## 2017-03-01 LAB — SALICYLATE LEVEL: Salicylate Lvl: <7 mg/dL (ref 2.8–30.0)

## 2017-03-01 LAB — RAPID URINE DRUG SCREEN, HOSP PERFORMED
Amphetamines: NOT DETECTED
BENZODIAZEPINES: POSITIVE — AB
Barbiturates: NOT DETECTED
COCAINE: NOT DETECTED
Opiates: NOT DETECTED
Tetrahydrocannabinol: NOT DETECTED

## 2017-03-01 LAB — ETHANOL

## 2017-03-01 LAB — ACETAMINOPHEN LEVEL: Acetaminophen (Tylenol), Serum: <10 ug/mL — ABNORMAL LOW (ref 10–30)

## 2017-03-01 LAB — CBG MONITORING, ED: GLUCOSE-CAPILLARY: 196 mg/dL — AB (ref 65–99)

## 2017-03-01 MED ORDER — VILAZODONE HCL 40 MG PO TABS: 40.0000 mg | ORAL_TABLET | Freq: Every day | ORAL | Status: DC

## 2017-03-01 MED ORDER — LISINOPRIL 10 MG PO TABS: 20.0000 mg | ORAL_TABLET | Freq: Every day | ORAL | Status: DC

## 2017-03-01 MED ORDER — LISINOPRIL-HYDROCHLOROTHIAZIDE 20-25 MG PO TABS: 1.0000 | ORAL_TABLET | Freq: Every day | ORAL | Status: DC

## 2017-03-01 MED ORDER — BUSPIRONE HCL 10 MG PO TABS
10.0000 mg | ORAL_TABLET | Freq: Three times a day (TID) | ORAL | Status: DC
  Administered 2017-03-01: 10 mg via ORAL
  Filled 2017-03-01 (×6): qty 1

## 2017-03-01 MED ORDER — BUSPIRONE HCL 5 MG PO TABS
ORAL_TABLET | ORAL | Status: AC
  Filled 2017-03-01: qty 2

## 2017-03-01 MED ORDER — CLONAZEPAM 0.5 MG PO TABS: 0.5000 mg | ORAL_TABLET | Freq: Three times a day (TID) | ORAL | Status: DC | PRN

## 2017-03-01 MED ORDER — METOPROLOL TARTRATE 25 MG PO TABS: 25.0000 mg | ORAL_TABLET | Freq: Two times a day (BID) | ORAL | Status: DC

## 2017-03-01 MED ORDER — HYDROXYZINE HCL 25 MG PO TABS: 25.0000 mg | ORAL_TABLET | Freq: Four times a day (QID) | ORAL | Status: DC | PRN

## 2017-03-01 MED ORDER — PRAVASTATIN SODIUM 40 MG PO TABS
40.0000 mg | ORAL_TABLET | Freq: Every day | ORAL | Status: DC
  Filled 2017-03-01 (×2): qty 1

## 2017-03-01 MED ORDER — HYDROCHLOROTHIAZIDE 25 MG PO TABS: 25.0000 mg | ORAL_TABLET | Freq: Every day | ORAL | Status: DC

## 2017-03-01 MED ORDER — PRAVASTATIN SODIUM 40 MG PO TABS: 40.0000 mg | ORAL_TABLET | Freq: Every day | ORAL | Status: DC

## 2017-03-01 MED ORDER — ARIPIPRAZOLE 2 MG PO TABS
2.0000 mg | ORAL_TABLET | Freq: Every day | ORAL | Status: DC
  Filled 2017-03-01 (×2): qty 1

## 2017-03-01 NOTE — ED Notes (Signed)
Report given to Quincy Sheehan, RN

## 2017-03-01 NOTE — ED Triage Notes (Signed)
Pt states she wants to harm herself by taking her own pills (metoprolo and xanax). Pt has access to these at home. Denies any HI. Denies any AVH. Pt has been taking medications as prescribed.
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
## 2017-03-02 ENCOUNTER — Inpatient Hospital Stay (HOSPITAL_COMMUNITY)
Admission: AD | Admit: 2017-03-02 | Discharge: 2017-03-05 | DRG: 885 | Disposition: A | Discharge status: Home / Self Care | Payer: Medicare Other | Source: Intra-hospital | Attending: Psychiatry | Admitting: Psychiatry

## 2017-03-02 ENCOUNTER — Encounter (HOSPITAL_COMMUNITY): Payer: Self-pay

## 2017-03-02 DIAGNOSIS — Z888 Allergy status to other drugs, medicaments and biological substances status: Secondary | ICD-10-CM | POA: Diagnosis not present

## 2017-03-02 DIAGNOSIS — W19XXXA Unspecified fall, initial encounter: Secondary | ICD-10-CM

## 2017-03-02 DIAGNOSIS — R45851 Suicidal ideations: Secondary | ICD-10-CM | POA: Diagnosis present

## 2017-03-02 DIAGNOSIS — Z8041 Family history of malignant neoplasm of ovary: Secondary | ICD-10-CM | POA: Diagnosis not present

## 2017-03-02 DIAGNOSIS — E119 Type 2 diabetes mellitus without complications: Secondary | ICD-10-CM | POA: Diagnosis present

## 2017-03-02 DIAGNOSIS — Z90722 Acquired absence of ovaries, bilateral: Secondary | ICD-10-CM | POA: Diagnosis not present

## 2017-03-02 DIAGNOSIS — Z818 Family history of other mental and behavioral disorders: Secondary | ICD-10-CM | POA: Diagnosis not present

## 2017-03-02 DIAGNOSIS — F332 Major depressive disorder, recurrent severe without psychotic features: Principal | ICD-10-CM | POA: Diagnosis present

## 2017-03-02 DIAGNOSIS — Z90710 Acquired absence of both cervix and uterus: Secondary | ICD-10-CM

## 2017-03-02 DIAGNOSIS — Z79899 Other long term (current) drug therapy: Secondary | ICD-10-CM | POA: Diagnosis not present

## 2017-03-02 LAB — GLUCOSE, CAPILLARY
GLUCOSE-CAPILLARY: 146 mg/dL — AB (ref 65–99)
GLUCOSE-CAPILLARY: 176 mg/dL — AB (ref 65–99)

## 2017-03-02 MED ORDER — ALUM & MAG HYDROXIDE-SIMETH 200-200-20 MG/5ML PO SUSP: 30.0000 mL | ORAL | Status: DC | PRN

## 2017-03-02 MED ORDER — BUSPIRONE HCL 10 MG PO TABS
10.0000 mg | ORAL_TABLET | Freq: Three times a day (TID) | ORAL | Status: DC
  Administered 2017-03-02 – 2017-03-05 (×11): 10 mg via ORAL
  Filled 2017-03-02 (×13): qty 1
  Filled 2017-03-02: qty 2
  Filled 2017-03-02: qty 1
  Filled 2017-03-02: qty 2
  Filled 2017-03-02 (×2): qty 1

## 2017-03-02 MED ORDER — MAGNESIUM HYDROXIDE 400 MG/5ML PO SUSP: 30.0000 mL | Freq: Every day | ORAL | Status: DC | PRN

## 2017-03-02 MED ORDER — LISINOPRIL 20 MG PO TABS
20.0000 mg | ORAL_TABLET | Freq: Every day | ORAL | Status: DC
  Administered 2017-03-02 – 2017-03-05 (×4): 20 mg via ORAL
  Filled 2017-03-02 (×7): qty 1

## 2017-03-02 MED ORDER — HYDROXYZINE HCL 25 MG PO TABS
25.0000 mg | ORAL_TABLET | Freq: Every evening | ORAL | Status: DC | PRN
  Filled 2017-03-02 (×7): qty 1

## 2017-03-02 MED ORDER — LINAGLIPTIN 5 MG PO TABS
5.0000 mg | ORAL_TABLET | Freq: Every day | ORAL | Status: DC
  Administered 2017-03-02 – 2017-03-05 (×4): 5 mg via ORAL
  Filled 2017-03-02 (×6): qty 1

## 2017-03-02 MED ORDER — VILAZODONE HCL 40 MG PO TABS
40.0000 mg | ORAL_TABLET | Freq: Every day | ORAL | Status: DC
  Administered 2017-03-02 – 2017-03-05 (×4): 40 mg via ORAL
  Filled 2017-03-02 (×6): qty 1

## 2017-03-02 MED ORDER — LINAGLIPTIN 5 MG PO TABS
5.0000 mg | ORAL_TABLET | Freq: Every day | ORAL | Status: DC
  Filled 2017-03-02 (×2): qty 1

## 2017-03-02 MED ORDER — HYDROCHLOROTHIAZIDE 25 MG PO TABS
25.0000 mg | ORAL_TABLET | Freq: Every day | ORAL | Status: DC
  Administered 2017-03-02 – 2017-03-05 (×4): 25 mg via ORAL
  Filled 2017-03-02 (×7): qty 1

## 2017-03-02 MED ORDER — HYDROXYZINE HCL 25 MG PO TABS
25.0000 mg | ORAL_TABLET | Freq: Three times a day (TID) | ORAL | Status: DC | PRN
  Filled 2017-03-02: qty 1

## 2017-03-02 MED ORDER — CANAGLIFLOZIN 300 MG PO TABS
300.0000 mg | ORAL_TABLET | Freq: Every day | ORAL | Status: DC
  Administered 2017-03-02 – 2017-03-05 (×4): 300 mg via ORAL
  Filled 2017-03-02 (×7): qty 1

## 2017-03-02 MED ORDER — MEDROXYPROGESTERONE ACETATE 5 MG PO TABS
5.0000 mg | ORAL_TABLET | Freq: Every day | ORAL | Status: DC
  Administered 2017-03-02 – 2017-03-05 (×4): 5 mg via ORAL
  Filled 2017-03-02 (×6): qty 1

## 2017-03-02 MED ORDER — ARIPIPRAZOLE 2 MG PO TABS
2.0000 mg | ORAL_TABLET | Freq: Every day | ORAL | Status: DC
  Administered 2017-03-02: 2 mg via ORAL
  Filled 2017-03-02 (×4): qty 1

## 2017-03-02 MED ORDER — METOPROLOL TARTRATE 25 MG PO TABS
25.0000 mg | ORAL_TABLET | Freq: Two times a day (BID) | ORAL | Status: DC
  Administered 2017-03-02 – 2017-03-05 (×7): 25 mg via ORAL
  Filled 2017-03-02 (×12): qty 1

## 2017-03-02 MED ORDER — ARIPIPRAZOLE 5 MG PO TABS
5.0000 mg | ORAL_TABLET | Freq: Every day | ORAL | Status: DC
  Administered 2017-03-03 – 2017-03-05 (×3): 5 mg via ORAL
  Filled 2017-03-02 (×6): qty 1

## 2017-03-02 MED ORDER — CLONAZEPAM 1 MG PO TABS
1.0000 mg | ORAL_TABLET | ORAL | Status: AC
  Administered 2017-03-02: 1 mg via ORAL

## 2017-03-02 MED ORDER — NAPROXEN 500 MG PO TABS
500.0000 mg | ORAL_TABLET | Freq: Two times a day (BID) | ORAL | Status: DC | PRN
  Administered 2017-03-02 – 2017-03-05 (×4): 500 mg via ORAL
  Filled 2017-03-02 (×4): qty 1

## 2017-03-02 MED ORDER — CLONAZEPAM 1 MG PO TABS
ORAL_TABLET | ORAL | Status: AC
  Filled 2017-03-02: qty 1

## 2017-03-02 MED ORDER — LISINOPRIL-HYDROCHLOROTHIAZIDE 20-25 MG PO TABS: 1.0000 | ORAL_TABLET | Freq: Every day | ORAL | Status: DC

## 2017-03-02 MED ORDER — CANAGLIFLOZIN-METFORMIN HCL 150-500 MG PO TABS: 1.0000 | ORAL_TABLET | Freq: Two times a day (BID) | ORAL | Status: DC

## 2017-03-02 MED ORDER — ACETAMINOPHEN 325 MG PO TABS: 650.0000 mg | ORAL_TABLET | Freq: Four times a day (QID) | ORAL | Status: DC | PRN

## 2017-03-02 MED ORDER — ESTRADIOL 2 MG PO TABS
2.0000 mg | ORAL_TABLET | Freq: Every day | ORAL | Status: DC
  Administered 2017-03-02 – 2017-03-05 (×4): 2 mg via ORAL
  Filled 2017-03-02: qty 2
  Filled 2017-03-02 (×6): qty 1

## 2017-03-02 MED ORDER — METFORMIN HCL 500 MG PO TABS
500.0000 mg | ORAL_TABLET | Freq: Two times a day (BID) | ORAL | Status: DC
  Administered 2017-03-02 – 2017-03-05 (×7): 500 mg via ORAL
  Filled 2017-03-02 (×12): qty 1

## 2017-03-02 NOTE — Progress Notes (Signed)
Patient ID: [name], female   DOB: 1965-12-31, 51 y.o.   MRN: [id number] Per State regulations 482.30 this chart was reviewed for medical necessity with respect to the patient's admission/duration of stay.    Next review date: 03/06/17  Debarah Crape, BSN, RN-BC  Case Manager

## 2017-03-02 NOTE — Plan of Care (Signed)
Problem: Medication: Goal: Compliance with prescribed medication regimen will improve Outcome: Progressing [name] reported some medications that she has been taking PTA and this information was given to NP L. Patient appears to be following regimen at this time but reports so medication needs that have not been added to her [facility]

## 2017-03-02 NOTE — Progress Notes (Signed)
Patient ID: [name], female   DOB: Oct 17, 1966, 51 y.o.   MRN: [id number]  DAR: Pt. Denies HI and A/V Hallucinations. She reports SI but is able to contract for safety. She reports sleep is poor, appetite is good, energy level is low, and concentration is poor. She rates depression 10/10, hopelessness 10/10, and anxiety 8/10. She reports on her daily inventory sheet, "I want to die." She is flat in affect and depressed in mood. She becomes irritable during med pass before lunch as she states, "I don't want Buspar! I want my Klonopin. I told her (NP Tanika L) that, she needs to stick to what my doctor has me on." Patient was notified by writer that Klonopin is not a current order on the [facility]. Patient stares blankly at Probation officer. She reports a headache and received PRN Naprosyn. Support and encouragement provided to the patient however patient appears isolative. She spends most of her morning in bed. Scheduled medications administered to patient per physician's orders. She is seen in the dayroom as the day progresses. Q15 minute checks are maintained for safety.

## 2017-03-02 NOTE — Progress Notes (Signed)
Patient presents with sad/depressed/anxious/tearful behavior during admission interview and assessment. VS monitored and recorded. Skin check performed with [place] and revealed skin clean, dry, and intact. Contraband was not found. Patient was oriented to unit and schedule. Pt states "I need to get my life under control with my depression and suicidal thoughts so I can role model for my daughter.I want to handle my stress better. I have lost a lot recently. My sister died several years back, and my other sister has a year left to live because of cancer. My kid is failing out of school, and I'm afraid she is turning into me. My friends don't speak to anymore because of drama. I just feel like I need to take a bunch of pills and just be one with this.". Pt endorses Passive SI and denies HI/AVH at this time. She contracts for safety. PO fluids provided. Safety maintained. Rest encouraged.

## 2017-03-02 NOTE — [facility] Group Notes (Signed)
[place] Group Notes: (Clinical Social Work)   03/02/2017      Type of Therapy:  Group Therapy   Participation Level:  Did Not Attend despite MHT prompting   Selmer Dominion, LCSW 03/02/2017, 1:06 PM

## 2017-03-02 NOTE — Tx Team (Signed)
Initial Treatment Plan 03/02/2017 3:53 AM [name] [id number]    PATIENT STRESSORS: Educational concerns Financial difficulties Health problems   PATIENT STRENGTHS: Ability for insight Average or above average intelligence Capable of independent living   PATIENT IDENTIFIED PROBLEMS: Suicidal Thought  Anxiety  Depression  "I want to handle my stress better"  "I need to get my life under control with my depression and suicidal thoughts so I can role model for my daughter"             DISCHARGE CRITERIA:  Ability to meet basic life and health needs Adequate post-discharge living arrangements Improved stabilization in mood, thinking, and/or behavior  PRELIMINARY DISCHARGE PLAN: Attend aftercare/continuing care group Outpatient therapy Return to previous living arrangement  PATIENT/FAMILY INVOLVEMENT: This treatment plan has been presented to and reviewed with the patient, [name].  The patient and family have been given the opportunity to ask questions and make suggestions.  Gwendolyn Fill, RN 03/02/2017, 3:53 AM
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
## 2017-03-02 NOTE — BHH Suicide Risk Assessment (Signed)
[place] INPATIENT:  Family/Significant Other Suicide Prevention Education  Suicide Prevention Education:  Patient Refusal for Family/Significant Other Suicide Prevention Education: The patient [name] has refused to provide written consent for family/significant other to be provided Family/Significant Other Suicide Prevention Education during admission and/or prior to discharge.  Physician notified.  Berlin Hun Grossman-Orr 03/02/2017, 4:05 PM
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
## 2017-03-02 NOTE — Progress Notes (Addendum)
Patient ID: [name], female   DOB: April 12, 1966, 51 y.o.   MRN: [id number]  Patient's BP has continued to increase throughout the day. Patient reported, "I don't want to withdrawal from Klonopin. I told them that I need itGlass blower/designer requested NP Tanika to review patient's PTA medications as patient is asking about several medications she had not received this morning. No new orders seen by this Probation officer throughout the day for Klonopin. After dinner this evening patient's BP was highly elevated. Charge RN Chong Sicilian was notified and she notified A/C Linsey. Dr. Sanjuana Letters was contacted and a one time order of 1 mg Klonopin PO was obtained and a one time CIWA. Patient's CIWA is 6 at this time. PRN Naprosyn was administered for patient's headache as well as patient's one time dose of Klonopin.

## 2017-03-02 NOTE — BHH Group Notes (Signed)
[place] Group Notes:  Life Skills Group  Date:  03/02/2017  Time:  4:57 PM  Type of Therapy:  Psychoeducational Skills  Participation Level:  Minimal  Participation Quality:  Appropriate  Affect:  Flat  Cognitive:  Alert  Insight:  Limited  Engagement in Group:  Engaged  Modes of Intervention:  Discussion and Education  Summary of Progress/Problems: Patient attended group and was engaged.  Gaylan Gerold E 03/02/2017, 4:57 PM

## 2017-03-02 NOTE — Progress Notes (Signed)
Patient attended wrap-up group and said that her day as a 1.  Something positive that happened during the day was she have some friends who stopped by to visit her.
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
## 2017-03-03 DIAGNOSIS — Z79899 Other long term (current) drug therapy: Secondary | ICD-10-CM

## 2017-03-03 DIAGNOSIS — Z818 Family history of other mental and behavioral disorders: Secondary | ICD-10-CM

## 2017-03-03 DIAGNOSIS — F332 Major depressive disorder, recurrent severe without psychotic features: Principal | ICD-10-CM

## 2017-03-03 LAB — GLUCOSE, CAPILLARY
Glucose-Capillary: 196 mg/dL — ABNORMAL HIGH (ref 65–99)
Glucose-Capillary: 233 mg/dL — ABNORMAL HIGH (ref 65–99)

## 2017-03-03 MED ORDER — CLONAZEPAM 0.5 MG PO TABS
0.5000 mg | ORAL_TABLET | Freq: Two times a day (BID) | ORAL | Status: DC
  Administered 2017-03-03 – 2017-03-05 (×5): 0.5 mg via ORAL
  Filled 2017-03-03 (×5): qty 1

## 2017-03-03 MED ORDER — HYDROXYZINE HCL 25 MG PO TABS: 25.0000 mg | ORAL_TABLET | Freq: Three times a day (TID) | ORAL | Status: DC | PRN

## 2017-03-03 NOTE — Progress Notes (Signed)
[facility] Group Notes:  (Nursing/MHT/Case Management/Adjunct)  Date:  03/03/2017  Time:  10:45 PM  Type of Therapy:  Psychoeducational Skills  Participation Level:  Active  Participation Quality:  Attentive  Affect:  Blunted  Cognitive:  Appropriate  Insight:  Appropriate  Engagement in Group:  Developing/Improving  Modes of Intervention:  Education  Summary of Progress/Problems:The patient shared with the group that she didn't feel well until her medication was changed. In terms of the theme for the day, her support system will consist of her mother.   Archie Balboa S 03/03/2017, 10:45 PM

## 2017-03-03 NOTE — [facility] Group Notes (Signed)
[facility] LCSW Group Therapy  03/03/2017 1:15pm  Type of Therapy: Group Therapy   Topic: Overcoming Obstacles  Participation Level: Active  Participation Quality: Appropriate   Affect: Appropriate  Cognitive: Appropriate and Oriented  Insight: Developing/Improving and Improving  Engagement in Therapy: Improving  Modes of Intervention: Discussion, Exploration, Problem-solving and Support  Description of Group:  In this group patients will be encouraged to explore what they see as obstacles to their own wellness and recovery. They will be guided to discuss their thoughts, feelings, and behaviors related to these obstacles. The group will process together ways to cope with barriers, with attention given to specific choices patients can make. Each patient will be challenged to identify changes they are motivated to make in order to overcome their obstacles. This group will be process-oriented, with patients participating in exploration of their own experiences as well as giving and receiving support and challenge from other group members.  Summary of Patient Progress:  Pt identified her main obstacle as her daughter currently. Pt's daughter is 29 yo and has been cutting lately and is also failing a lot of her classes. Pt voiced concerns about her daughter picking up on a lot of the pt's unhealthy behaviors and being a bad influence on her daughter. Pt states she would like to get her own mental health concerns under control so that she will be able to better help her daughter.  Therapeutic Modalities:  Cognitive Behavioral Therapy Solution Focused Therapy Motivational Interviewing Relapse Prevention Therapy  Georga Kaufmann, MSW, LCSWA 3:14 PM 03/03/17

## 2017-03-03 NOTE — Tx Team (Signed)
Interdisciplinary Treatment and Diagnostic Plan Update 03/03/2017 Time of Session: 9:30am  [name]  MRN: [id number]  Principal Diagnosis: Recurrent major depression-severe [facility])  Secondary Diagnoses: Principal Problem:   Recurrent major depression-severe ([place])   Current Medications:  Current Facility-Administered Medications  Medication Dose Route Frequency Provider Last Rate Last Dose  . acetaminophen (TYLENOL) tablet 650 mg  650 mg Oral Q6H PRN Rozetta Nunnery, NP      . alum & mag hydroxide-simeth (MAALOX/MYLANTA) 200-200-20 MG/5ML suspension 30 mL  30 mL Oral Q4H PRN Rozetta Nunnery, NP      . ARIPiprazole (ABILIFY) tablet 5 mg  5 mg Oral Daily Artist Beach, MD   5 mg at 03/03/17 0817  . busPIRone (BUSPAR) tablet 10 mg  10 mg Oral TID Rozetta Nunnery, NP   10 mg at 03/03/17 1123  . canagliflozin (INVOKANA) tablet 300 mg  300 mg Oral QAC breakfast Jenne Campus, MD   300 mg at 03/03/17 4970  . clonazePAM (KLONOPIN) tablet 0.5 mg  0.5 mg Oral BID Jenne Campus, MD   0.5 mg at 03/03/17 1123  . estradiol (ESTRACE) tablet 2 mg  2 mg Oral Daily Derrill Center, NP   2 mg at 03/03/17 2637  . hydrochlorothiazide (HYDRODIURIL) tablet 25 mg  25 mg Oral Daily Jenne Campus, MD   25 mg at 03/03/17 0815  . hydrOXYzine (ATARAX/VISTARIL) tablet 25 mg  25 mg Oral TID PRN Jenne Campus, MD      . linagliptin (TRADJENTA) tablet 5 mg  5 mg Oral Daily Encarnacion Slates, NP   5 mg at 03/03/17 0816  . lisinopril (PRINIVIL,ZESTRIL) tablet 20 mg  20 mg Oral Daily Jenne Campus, MD   20 mg at 03/03/17 0816  . magnesium hydroxide (MILK OF MAGNESIA) suspension 30 mL  30 mL Oral Daily PRN Rozetta Nunnery, NP      . medroxyPROGESTERone (PROVERA) tablet 5 mg  5 mg Oral Daily Derrill Center, NP   5 mg at 03/03/17 0816  . metFORMIN (GLUCOPHAGE) tablet 500 mg  500 mg Oral BID WC Jenne Campus, MD   500 mg at 03/03/17 0816  . metoprolol tartrate (LOPRESSOR) tablet 25 mg  25 mg Oral BID Derrill Center, NP   25 mg at 03/03/17 0816  . naproxen (NAPROSYN) tablet 500 mg  500 mg Oral BID PRN Derrill Center, NP   500 mg at 03/03/17 1123  . Vilazodone HCl (VIIBRYD) TABS 40 mg  40 mg Oral Daily Rozetta Nunnery, NP   40 mg at 03/03/17 0815    PTA Medications: Prescriptions Prior to Admission  Medication Sig Dispense Refill Last Dose  . ARIPiprazole (ABILIFY) 2 MG tablet Take 1 tablet (2 mg total) by mouth daily. For mood control 30 tablet 2 03/01/2017 at Unknown time  . busPIRone (BUSPAR) 10 MG tablet Take 1 tablet (10 mg total) by mouth 3 (three) times daily. For anxiety 90 tablet 2 03/01/2017 at Unknown time  . Canagliflozin-Metformin HCl (INVOKAMET) 150-500 MG TABS Take 1 tablet by mouth 2 (two) times daily.    03/01/2017 at Unknown time  . clonazePAM (KLONOPIN) 0.5 MG tablet Take 1 tablet (0.5 mg total) by mouth 3 (three) times daily. For anxiety 90 tablet 2 03/01/2017 at Unknown time  . estradiol (ESTRACE) 2 MG tablet Take 1 tablet (2 mg total) by mouth daily. For hormone replacement   03/01/2017 at Unknown time  .  hydrOXYzine (ATARAX/VISTARIL) 25 MG tablet Take 1 tablet (25 mg total) by mouth every 6 (six) hours as needed for anxiety. 60 tablet 2 unknown  . lisinopril-hydrochlorothiazide (PRINZIDE,ZESTORETIC) 20-25 MG tablet Take 1 tablet by mouth daily. For high blood pressure   03/01/2017 at Unknown time  . Magnesium 500 MG CAPS Take 1 capsule by mouth daily.   03/01/2017 at Unknown time  . medroxyPROGESTERone (PROVERA) 5 MG tablet take 1/2 tablet once daily 15 tablet 11 03/01/2017 at Unknown time  . MELATONIN PO Take 1 capsule by mouth at bedtime.   02/28/2017 at Unknown time  . metoprolol tartrate (LOPRESSOR) 25 MG tablet Take 1 tablet (25 mg total) by mouth 2 (two) times daily. For high blood pressure   03/01/2017 at 1600  . naproxen sodium (ALEVE) 220 MG tablet Take 220-440 mg by mouth daily as needed (for headache).   Past Month at Unknown time  . pravastatin (PRAVACHOL) 40 MG tablet Take 1 tablet (40 mg  total) by mouth daily. For high cholesterol 1 tablet 0 03/01/2017 at Unknown time  . rizatriptan (MAXALT) 10 MG tablet Take 10 mg by mouth as needed for migraine.    unknown  . TRADJENTA 5 MG TABS tablet Take 5 mg by mouth daily.   03/01/2017 at Unknown time  . Vilazodone HCl (VIIBRYD) 40 MG TABS Take 1 tablet (40 mg total) by mouth daily. For depression 30 tablet 2 03/01/2017 at Unknown time    Treatment Modalities: Medication Management, Group therapy, Case management,  1 to 1 session with clinician, Psychoeducation, Recreational therapy.  Patient Stressors: Educational concerns Financial difficulties Health problems Patient Strengths: Ability for insight Average or above average intelligence Capable of independent living  Physician Treatment Plan for Primary Diagnosis: Recurrent major depression-severe (HCC) Long Term Goal(s): Improvement in symptoms so as ready for discharge Short Term Goals: Ability to identify changes in lifestyle to reduce recurrence of condition will improve Ability to verbalize feelings will improve Ability to disclose and discuss suicidal ideas Ability to demonstrate self-control will improve Ability to maintain clinical measurements within normal limits will improve Ability to identify changes in lifestyle to reduce recurrence of condition will improve Ability to verbalize feelings will improve Compliance with prescribed medications will improve Ability to identify triggers associated with substance abuse/mental health issues will improve  Medication Management: Evaluate patient's response, side effects, and tolerance of medication regimen.  Therapeutic Interventions: 1 to 1 sessions, Unit Group sessions and Medication administration.  Evaluation of Outcomes: Not Met  Physician Treatment Plan for Secondary Diagnosis: Principal Problem:   Recurrent major depression-severe (HCC)  Long Term Goal(s): Improvement in symptoms so as ready for discharge  Short  Term Goals: Ability to identify changes in lifestyle to reduce recurrence of condition will improve Ability to verbalize feelings will improve Ability to disclose and discuss suicidal ideas Ability to demonstrate self-control will improve Ability to maintain clinical measurements within normal limits will improve Ability to identify changes in lifestyle to reduce recurrence of condition will improve Ability to verbalize feelings will improve Compliance with prescribed medications will improve Ability to identify triggers associated with substance abuse/mental health issues will improve  Medication Management: Evaluate patient's response, side effects, and tolerance of medication regimen.  Therapeutic Interventions: 1 to 1 sessions, Unit Group sessions and Medication administration.  Evaluation of Outcomes: Not Met  RN Treatment Plan for Primary Diagnosis: Recurrent major depression-severe (HCC) Long Term Goal(s): Knowledge of disease and therapeutic regimen to maintain health will improve  Short Term Goals:   Ability to remain free from injury will improve, Ability to disclose and discuss suicidal ideas, Ability to identify and develop effective coping behaviors will improve and Compliance with prescribed medications will improve  Medication Management: RN will administer medications as ordered by provider, will assess and evaluate patient's response and provide education to patient for prescribed medication. RN will report any adverse and/or side effects to prescribing provider.  Therapeutic Interventions: 1 on 1 counseling sessions, Psychoeducation, Medication administration, Evaluate responses to treatment, Monitor vital signs and CBGs as ordered, Perform/monitor CIWA, COWS, AIMS and Fall Risk screenings as ordered, Perform wound care treatments as ordered.  Evaluation of Outcomes: Not Met  LCSW Treatment Plan for Primary Diagnosis: Recurrent major depression-severe ([place]) Long Term  Goal(s): Safe transition to appropriate next level of care at discharge, Engage patient in therapeutic group addressing interpersonal concerns. Short Term Goals: Engage patient in aftercare planning with referrals and resources, Increase emotional regulation, Facilitate patient progression through stages of change regarding substance use diagnoses and concerns, Identify triggers associated with mental health/substance abuse issues and Increase skills for wellness and recovery  Therapeutic Interventions: Assess for all discharge needs, 1 to 1 time with Social worker, Explore available resources and support systems, Assess for adequacy in community support network, Educate family and significant other(s) on suicide prevention, Complete Psychosocial Assessment, Interpersonal group therapy.  Evaluation of Outcomes: Not Met  Progress in Treatment: Attending groups: Pt is new to milieu, continuing to assess  Participating in groups: Pt is new to milieu, continuing to assess  Taking medication as prescribed: Yes, MD continues to assess for medication changes as needed Toleration medication: Yes, no side effects reported at this time Family/Significant other contact made: No, pt declined contact. Patient understands diagnosis: Continuing to assess Discussing patient identified problems/goals with staff: Yes Medical problems stabilized or resolved: Yes Denies suicidal/homicidal ideation:  Issues/concerns per patient self-inventory: None Other: N/A  New problem(s) identified: None identified at this time.   New Short Term/Long Term Goal(s): None identified at this time.   Discharge Plan or Barriers:   Reason for Continuation of Hospitalization:  Anxiety  Depression Medication stabilization Suicidal ideation  Estimated Length of Stay: 3-5 days  Attendees: Patient: 03/03/2017 4:48 PM  Physician: Dr. Parke Poisson 03/03/2017 4:48 PM  Nursing: Santiago Glad RN; [place], RN 03/03/2017 4:48 PM  RN Care Manager:  Lars Pinks, RN 03/03/2017 4:48 PM  Social Worker: Adriana Reams, LCSW; Matthew Saras, [place] 03/03/2017 4:48 PM  Recreational Therapist:  03/03/2017 4:48 PM  Other: Lindell Spar, NP; Samuel Jester, NP 03/03/2017 4:48 PM  Other:  03/03/2017 4:48 PM  Other: 03/03/2017 4:48 PM   Scribe for Treatment Team: Georga Kaufmann, MSW,LCSWA 03/03/2017 4:48 PM

## 2017-03-03 NOTE — Progress Notes (Signed)
D: [name] denied SI on her self inventory, but endorsed it to this Probation officer. She contracted for safety. She complained of a headache but declined offered PRN medication. Her tone was muted but she was pleasant and cooperative. On her self inventory, she rated her depression 8/10, anxiety 7/10, and feeling of hopelessness 8/10. She reported good sleep, good appetite, low energy level, and poor concentration. She was concerned at a.m med pass that she could not get Klonopin, which had been d/c'd. The med was reordered later. When this writer went to find pt later to administer, pt appeared to be asleep.   A: Meds given as ordered. Q15 safety checks maintained. Support/encouragement offered.  R: Pt remains free from harm and continues with treatment. Will continue to monitor for needs/safety.

## 2017-03-03 NOTE — Progress Notes (Signed)
DAR Note: Patient's BP at bedtime was 143/71 sitting and 132/72 standing. Patient verbalizes less headache. Remains in her bed. Patient encouraged patient to attend groups which she did but went right back to bed after the group. Denies SI/HI, AH/VH at this time. Continues to endorse depression of 6/10. Patient refused her bedtime Vistaril stated "No now. Will let you know before I sleep". Staff observe patient sleeping when she took the med to her room. Continues to sleep at this time. Will continue to  Monitor patient.

## 2017-03-03 NOTE — Progress Notes (Signed)
Recreation Therapy Notes  Date: 03/03/17 Time: 0930 Location: [address] Dayroom  Group Topic: Stress Management  Goal Area(s) Addresses:  Patient will verbalize importance of using healthy stress management.  Patient will identify positive emotions associated with healthy stress management.   Behavioral Response: Engaged  Intervention: Stress Management  Activity :  Guided Visualization.  LRT introduced the stress management technique of guided visualization.  LRT read a script to allow patient to follow along and engage in the activity.  Patients were to follow along at LRT read script to engage in the activity.  Education:  Stress Management, Discharge Planning.   Education Outcome: Acknowledges edcuation/In group clarification offered/Needs additional education  Clinical Observations/Feedback: Pt attended group.    Victorino Sparrow, LRT/CTRS        Victorino Sparrow A 03/03/2017 12:40 PM
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
## 2017-03-04 ENCOUNTER — Telehealth (HOSPITAL_COMMUNITY): Payer: Self-pay | Admitting: *Deleted

## 2017-03-04 LAB — GLUCOSE, CAPILLARY
GLUCOSE-CAPILLARY: 149 mg/dL — AB (ref 65–99)
GLUCOSE-CAPILLARY: 166 mg/dL — AB (ref 65–99)
Glucose-Capillary: 252 mg/dL — ABNORMAL HIGH (ref 65–99)

## 2017-03-04 NOTE — Progress Notes (Signed)
Pt was up for group time, but returned to bed right after.  She has been in bed most of the evening.  Pt reports her day has been "ok".  She states that she can't seem to get rid of a headache that she has had most of the day.  She declined any medication and said she was just going to go to bed early.  She denies SI/HI/AVH at this time.  Pt voiced no needs or concerns.  Support and encouragement offered.  Discharge plans are in process.  Safety maintained with q15 minute checks.

## 2017-03-04 NOTE — Progress Notes (Signed)
D: Makynna has remained in bed for most of the day. She denied SI, HI, and AVH. She has been calm and appropriate when out of bed, but she did not attend groups. She has continued to endorse a headache but has declined all offers of available PRNs. She reported good sleep, good appetite, low energy level, and poor concentration. She rated her depression 7/10, hopelessness 6/10, and anxiety 6/10.   A: Meds given as ordered. No PRNs given. Q15 safety checks maintained. Support/encouragement offered.  R: Pt remains free from harm and continues with treatment. Will continue to monitor for needs/safety.

## 2017-03-04 NOTE — Telephone Encounter (Signed)
LEFT VOICE MESSAGE, PROVIDER OUT OF OFFICE 03/21/17.

## 2017-03-04 NOTE — Progress Notes (Signed)
Recreation Therapy Notes  Animal-Assisted Activity (AAA) Program Checklist/Progress Notes Patient Eligibility Criteria Checklist & Daily Group note for Rec TxIntervention  Date: 03.06.2018 Time: 2:50pm Location: [address]    AAA/T Program Assumption of Risk Form signed by Patient/ or Parent Legal Guardian Yes  Patient is free of allergies or sever asthma Yes  Patient reports no fear of animals Yes  Patient reports no history of cruelty to animals Yes  Patient understands his/her participation is voluntary Yes  Behavioral Response: Did not attend.   Laureen Ochs Kersten Salmons, LRT/CTRS         Jmichael Gille L 03/04/2017 3:11 PM

## 2017-03-04 NOTE — BHH Group Notes (Signed)
[facility] Group Notes:  (Nursing/MHT/Case Management/Adjunct)  Date:  03/04/2017  Time:  9:15 AM  Type of Therapy:  Nurse Education  Participation Level:  Did Not Attend  Summary of Progress/Problems: Pt was invited. Pt did not attend.  Marya Landry 03/04/2017, 9:15 AM

## 2017-03-04 NOTE — Telephone Encounter (Signed)
returned phone call to patient regarding appointment. 

## 2017-03-04 NOTE — BHH Group Notes (Signed)
[place] LCSW Group Therapy 03/04/2017 1:15 PM  Type of Therapy: Group Therapy- Feelings about Diagnosis  Participation Level: Pt invited. Did not attend.  Georga Kaufmann, MSW, LCSWA 03/04/2017 3:17 PM

## 2017-03-04 NOTE — Progress Notes (Signed)
Adult Psychoeducational Group Note  Date:  03/04/2017 Time:  8:39 PM  Group Topic/Focus:  Wrap-Up Group:   The focus of this group is to help patients review their daily goal of treatment and discuss progress on daily workbooks.  Participation Level:  Active  Participation Quality:  Appropriate  Affect:  Appropriate  Cognitive:  Alert  Insight: Appropriate  Engagement in Group:  Engaged  Modes of Intervention:  Discussion  Additional Comments:  Pt rated her day 5/10. Pt stated that her goal is to discharge tomorrow.   Sharmon Revere 03/04/2017, 8:39 PM
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
## 2017-03-05 LAB — GLUCOSE, CAPILLARY: Glucose-Capillary: 163 mg/dL — ABNORMAL HIGH (ref 65–99)

## 2017-03-05 MED ORDER — TRADJENTA 5 MG PO TABS: 5.0000 mg | ORAL_TABLET | Freq: Every day | ORAL | Status: AC

## 2017-03-05 MED ORDER — LISINOPRIL-HYDROCHLOROTHIAZIDE 20-25 MG PO TABS: 1.0000 | ORAL_TABLET | Freq: Every day | ORAL | Status: DC

## 2017-03-05 MED ORDER — ESTRADIOL 2 MG PO TABS: 2.0000 mg | ORAL_TABLET | Freq: Every day | ORAL | Status: DC

## 2017-03-05 MED ORDER — NAPROXEN SODIUM 220 MG PO TABS: 220.0000 mg | ORAL_TABLET | Freq: Every day | ORAL | Status: DC | PRN

## 2017-03-05 MED ORDER — CLONAZEPAM 0.5 MG PO TABS: 0.5000 mg | ORAL_TABLET | Freq: Two times a day (BID) | ORAL | 0 refills | Status: DC

## 2017-03-05 MED ORDER — CANAGLIFLOZIN-METFORMIN HCL 150-500 MG PO TABS: 1.0000 | ORAL_TABLET | Freq: Two times a day (BID) | ORAL | 0 refills | Status: AC

## 2017-03-05 MED ORDER — BUSPIRONE HCL 10 MG PO TABS: 10.0000 mg | ORAL_TABLET | Freq: Three times a day (TID) | ORAL | 0 refills | Status: DC

## 2017-03-05 MED ORDER — KETOROLAC TROMETHAMINE 60 MG/2ML IM SOLN
30.0000 mg | Freq: Once | INTRAMUSCULAR | Status: AC
Start: 2017-03-05 — End: 2017-03-05
  Administered 2017-03-05: 30 mg via INTRAMUSCULAR
  Filled 2017-03-05 (×2): qty 2

## 2017-03-05 MED ORDER — MEDROXYPROGESTERONE ACETATE 5 MG PO TABS: 5.0000 mg | ORAL_TABLET | Freq: Every day | ORAL | 0 refills | Status: AC

## 2017-03-05 MED ORDER — METOPROLOL TARTRATE 25 MG PO TABS: 25.0000 mg | ORAL_TABLET | Freq: Two times a day (BID) | ORAL | Status: AC

## 2017-03-05 MED ORDER — VILAZODONE HCL 40 MG PO TABS
40.0000 mg | ORAL_TABLET | Freq: Every day | ORAL | 0 refills | Status: DC
Start: 2017-03-06 — End: 2017-03-06

## 2017-03-05 MED ORDER — HYDROXYZINE HCL 25 MG PO TABS: 25.0000 mg | ORAL_TABLET | Freq: Three times a day (TID) | ORAL | 0 refills | Status: DC | PRN

## 2017-03-05 MED ORDER — ARIPIPRAZOLE 5 MG PO TABS: 5.0000 mg | ORAL_TABLET | Freq: Every day | ORAL | 0 refills | Status: DC

## 2017-03-05 NOTE — Progress Notes (Signed)
  [facility] Adult Case Management Discharge Plan :  Will you be returning to the same living situation after discharge:  Yes,  pt returning home. At discharge, do you have transportation home?: Yes,  Pt's friend will pick her up. Do you have the ability to pay for your medications: Yes,  pt has insurance.  Release of information consent forms completed and in the chart;  Patient's signature needed at discharge.  Patient to Follow up at: Follow-up Information    [facility]. Go to.   Specialty:  Behavioral Health Why:  Appointment on 03/06/17  @9 :15am with Dr. Levonne Spiller for med management. Appointment with Maurice Small 03/07/17 at [place] for therapy.  Contact information: [address] [place] [phone]          Next level of care provider has access to [place] and Suicide Prevention discussed: Yes,  with pt.  Have you used any form of tobacco in the last 30 days? (Cigarettes, Smokeless Tobacco, Cigars, and/or Pipes): No  Has patient been referred to the Quitline?: N/A patient is not a smoker  Patient has been referred for addiction treatment: N/A  Georga Kaufmann 03/05/2017, 10:41 AM
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
## 2017-03-05 NOTE — Progress Notes (Signed)
Recreation Therapy Notes  Date: 03/05/17 Time: 0930 Location: 300 Hall Group Room  Group Topic: Stress Management  Goal Area(s) Addresses:  Patient will verbalize importance of using healthy stress management.  Patient will identify positive emotions associated with healthy stress management.   Behavioral Response: Engaged  Intervention: Stress Management  Activity :  Mindfulness Meditation.  LRT introduced the stress management technique of mindfulness meditation.  LRT played a meditation from the Calm app to allow patients to participate in mindfulness.  Patients were to follow along as the meditation played.   Education:  Stress Management, Discharge Planning.   Education Outcome: Acknowledges edcuation/In group clarification offered/Needs additional education  Clinical Observations/Feedback: Pt attended group.   Victorino Sparrow, LRT/CTRS         Victorino Sparrow A 03/05/2017 11:25 AM

## 2017-03-05 NOTE — Progress Notes (Signed)
[name] had been up and visible in milieu this evening, did attend and participate in evening group activity. [name] spoke about how she would like to be discharged in the morning. She denied SI, did endorse a slight headache and did not ask for or receive any bedtime medications. [name] retired to her room shortly after group was over and remained there for most of the evening. A. Support and encouragement provided. R. Safety maintained, will continue to monitor.

## 2017-03-05 NOTE — Progress Notes (Signed)
Patient ID: [name], female   DOB: [birth year], 52 y.o.   MRN: [id number]  DAR: Pt. Denies SI/HI and A/V Hallucinations. She reports sleep is fair, appetite is fair, energy level is low, and concentration is poor. She rates depression 5/10, hopelessness 4/10, and anxiety 4/10. Patient does report a mild headache and received PRN Naprosyn which provided some relief. Support and encouragement provided to the patient. Scheduled medications administered to patient per physician's orders. She is seen mostly laying in her bed resting but can be seen in the dayroom minimally. Patient reports she is ready for discharge. She is minimal with interaction in regards to this Probation officer but she is cooperative. Q15 minute checks are maintained for safety.
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
## 2017-03-05 NOTE — Progress Notes (Signed)
Pt d/c from the hospital. All items returned. D/c instructions given and prescriptions given. Pt denies si and hi. 

## 2017-03-05 NOTE — Tx Team (Signed)
Interdisciplinary Treatment and Diagnostic Plan Update 03/05/2017 Time of Session: 9:30am  JORDANNE ELSBURY  MRN: [id number]  Principal Diagnosis: Recurrent major depression-severe [facility])  Secondary Diagnoses: Principal Problem:   Recurrent major depression-severe ([place])   Current Medications:  Current Facility-Administered Medications  Medication Dose Route Frequency Provider Last Rate Last Dose  . acetaminophen (TYLENOL) tablet 650 mg  650 mg Oral Q6H PRN Rozetta Nunnery, NP      . alum & mag hydroxide-simeth (MAALOX/MYLANTA) 200-200-20 MG/5ML suspension 30 mL  30 mL Oral Q4H PRN Rozetta Nunnery, NP      . ARIPiprazole (ABILIFY) tablet 5 mg  5 mg Oral Daily Artist Beach, MD   5 mg at 03/05/17 0803  . busPIRone (BUSPAR) tablet 10 mg  10 mg Oral TID Rozetta Nunnery, NP   10 mg at 03/05/17 0803  . canagliflozin (INVOKANA) tablet 300 mg  300 mg Oral QAC breakfast Jenne Campus, MD   300 mg at 03/05/17 9563  . clonazePAM (KLONOPIN) tablet 0.5 mg  0.5 mg Oral BID Jenne Campus, MD   0.5 mg at 03/05/17 0802  . estradiol (ESTRACE) tablet 2 mg  2 mg Oral Daily Derrill Center, NP   2 mg at 03/05/17 0803  . hydrochlorothiazide (HYDRODIURIL) tablet 25 mg  25 mg Oral Daily Jenne Campus, MD   25 mg at 03/05/17 0803  . hydrOXYzine (ATARAX/VISTARIL) tablet 25 mg  25 mg Oral TID PRN Jenne Campus, MD      . linagliptin (TRADJENTA) tablet 5 mg  5 mg Oral Daily Encarnacion Slates, NP   5 mg at 03/05/17 0803  . lisinopril (PRINIVIL,ZESTRIL) tablet 20 mg  20 mg Oral Daily Jenne Campus, MD   20 mg at 03/05/17 0803  . magnesium hydroxide (MILK OF MAGNESIA) suspension 30 mL  30 mL Oral Daily PRN Rozetta Nunnery, NP      . medroxyPROGESTERone (PROVERA) tablet 5 mg  5 mg Oral Daily Derrill Center, NP   5 mg at 03/05/17 0802  . metFORMIN (GLUCOPHAGE) tablet 500 mg  500 mg Oral BID WC Jenne Campus, MD   500 mg at 03/05/17 0802  . metoprolol tartrate (LOPRESSOR) tablet 25 mg  25 mg Oral BID Derrill Center, NP   25 mg at 03/05/17 0802  . naproxen (NAPROSYN) tablet 500 mg  500 mg Oral BID PRN Derrill Center, NP   500 mg at 03/05/17 0805  . Vilazodone HCl (VIIBRYD) TABS 40 mg  40 mg Oral Daily Rozetta Nunnery, NP   40 mg at 03/05/17 0802    PTA Medications: Prescriptions Prior to Admission  Medication Sig Dispense Refill Last Dose  . ARIPiprazole (ABILIFY) 2 MG tablet Take 1 tablet (2 mg total) by mouth daily. For mood control 30 tablet 2 03/01/2017 at Unknown time  . busPIRone (BUSPAR) 10 MG tablet Take 1 tablet (10 mg total) by mouth 3 (three) times daily. For anxiety 90 tablet 2 03/01/2017 at Unknown time  . Canagliflozin-Metformin HCl (INVOKAMET) 150-500 MG TABS Take 1 tablet by mouth 2 (two) times daily.    03/01/2017 at Unknown time  . clonazePAM (KLONOPIN) 0.5 MG tablet Take 1 tablet (0.5 mg total) by mouth 3 (three) times daily. For anxiety 90 tablet 2 03/01/2017 at Unknown time  . estradiol (ESTRACE) 2 MG tablet Take 1 tablet (2 mg total) by mouth daily. For hormone replacement   03/01/2017 at Unknown time  .  hydrOXYzine (ATARAX/VISTARIL) 25 MG tablet Take 1 tablet (25 mg total) by mouth every 6 (six) hours as needed for anxiety. 60 tablet 2 unknown  . lisinopril-hydrochlorothiazide (PRINZIDE,ZESTORETIC) 20-25 MG tablet Take 1 tablet by mouth daily. For high blood pressure   03/01/2017 at Unknown time  . Magnesium 500 MG CAPS Take 1 capsule by mouth daily.   03/01/2017 at Unknown time  . medroxyPROGESTERone (PROVERA) 5 MG tablet take 1/2 tablet once daily 15 tablet 11 03/01/2017 at Unknown time  . MELATONIN PO Take 1 capsule by mouth at bedtime.   02/28/2017 at Unknown time  . metoprolol tartrate (LOPRESSOR) 25 MG tablet Take 1 tablet (25 mg total) by mouth 2 (two) times daily. For high blood pressure   03/01/2017 at 1600  . naproxen sodium (ALEVE) 220 MG tablet Take 220-440 mg by mouth daily as needed (for headache).   Past Month at Unknown time  . pravastatin (PRAVACHOL) 40 MG tablet Take 1 tablet (40 mg  total) by mouth daily. For high cholesterol 1 tablet 0 03/01/2017 at Unknown time  . rizatriptan (MAXALT) 10 MG tablet Take 10 mg by mouth as needed for migraine.    unknown  . TRADJENTA 5 MG TABS tablet Take 5 mg by mouth daily.   03/01/2017 at Unknown time  . Vilazodone HCl (VIIBRYD) 40 MG TABS Take 1 tablet (40 mg total) by mouth daily. For depression 30 tablet 2 03/01/2017 at Unknown time    Treatment Modalities: Medication Management, Group therapy, Case management,  1 to 1 session with clinician, Psychoeducation, Recreational therapy.  Patient Stressors: Network engineer difficulties Health problems Patient Strengths: Ability for insight Average or above average intelligence Capable of independent living  Physician Treatment Plan for Primary Diagnosis: Recurrent major depression-severe ([place]) Long Term Goal(s): Improvement in symptoms so as ready for discharge Short Term Goals: Ability to identify changes in lifestyle to reduce recurrence of condition will improve Ability to verbalize feelings will improve Ability to disclose and discuss suicidal ideas Ability to demonstrate self-control will improve Ability to maintain clinical measurements within normal limits will improve Ability to identify changes in lifestyle to reduce recurrence of condition will improve Ability to verbalize feelings will improve Compliance with prescribed medications will improve Ability to identify triggers associated with substance abuse/mental health issues will improve  Medication Management: Evaluate patient's response, side effects, and tolerance of medication regimen.  Therapeutic Interventions: 1 to 1 sessions, Unit Group sessions and Medication administration.  Evaluation of Outcomes: Adequate for Discharge  Physician Treatment Plan for Secondary Diagnosis: Principal Problem:   Recurrent major depression-severe ([place])  Long Term Goal(s): Improvement in symptoms so as ready for  discharge  Short Term Goals: Ability to identify changes in lifestyle to reduce recurrence of condition will improve Ability to verbalize feelings will improve Ability to disclose and discuss suicidal ideas Ability to demonstrate self-control will improve Ability to maintain clinical measurements within normal limits will improve Ability to identify changes in lifestyle to reduce recurrence of condition will improve Ability to verbalize feelings will improve Compliance with prescribed medications will improve Ability to identify triggers associated with substance abuse/mental health issues will improve  Medication Management: Evaluate patient's response, side effects, and tolerance of medication regimen.  Therapeutic Interventions: 1 to 1 sessions, Unit Group sessions and Medication administration.  Evaluation of Outcomes: Adequate for Discharge  RN Treatment Plan for Primary Diagnosis: Recurrent major depression-severe ([place]) Long Term Goal(s): Knowledge of disease and therapeutic regimen to maintain health will improve  Short  Term Goals: Ability to remain free from injury will improve, Ability to disclose and discuss suicidal ideas, Ability to identify and develop effective coping behaviors will improve and Compliance with prescribed medications will improve  Medication Management: RN will administer medications as ordered by provider, will assess and evaluate patient's response and provide education to patient for prescribed medication. RN will report any adverse and/or side effects to prescribing provider.  Therapeutic Interventions: 1 on 1 counseling sessions, Psychoeducation, Medication administration, Evaluate responses to treatment, Monitor vital signs and CBGs as ordered, Perform/monitor CIWA, COWS, AIMS and Fall Risk screenings as ordered, Perform wound care treatments as ordered.  Evaluation of Outcomes: Adequate for Discharge  LCSW Treatment Plan for Primary Diagnosis:  Recurrent major depression-severe ([place]) Long Term Goal(s): Safe transition to appropriate next level of care at discharge, Engage patient in therapeutic group addressing interpersonal concerns. Short Term Goals: Engage patient in aftercare planning with referrals and resources, Increase emotional regulation, Facilitate patient progression through stages of change regarding substance use diagnoses and concerns, Identify triggers associated with mental health/substance abuse issues and Increase skills for wellness and recovery  Therapeutic Interventions: Assess for all discharge needs, 1 to 1 time with Social worker, Explore available resources and support systems, Assess for adequacy in community support network, Educate family and significant other(s) on suicide prevention, Complete Psychosocial Assessment, Interpersonal group therapy.  Evaluation of Outcomes: Adequate for Discharge  Progress in Treatment: Attending groups: Yes Participating in groups: Yes Taking medication as prescribed: Yes, MD continues to assess for medication changes as needed. Toleration medication: Yes, no side effects reported at this time Family/Significant other contact made: No, pt declined contact. Patient understands diagnosis: Yes, AEB willingness to participate in treatment. Discussing patient identified problems/goals with staff: Yes Medical problems stabilized or resolved: Yes Denies suicidal/homicidal ideation: Yes Issues/concerns per patient self-inventory: None Other: N/A  New problem(s) identified: None identified at this time.   New Short Term/Long Term Goal(s): None identified at this time.   Discharge Plan or Barriers: Pt will return home and follow-up outpatient with [facility].  Reason for Continuation of Hospitalization:  None identified.  Estimated Length of Stay: 0 days  Attendees: Patient: 03/05/2017 11:10 AM  Physician: Dr. Parke Poisson 03/05/2017 11:10 AM  Nursing: Sharl Ma RN;  Christa, RN 03/05/2017 11:10 AM  RN Care Manager: Lars Pinks, RN 03/05/2017 11:10 AM  Social Worker: Adriana Reams, LCSW; Matthew Saras, [place] 03/05/2017 11:10 AM  Recreational Therapist:  03/05/2017 11:10 AM  Other: Lindell Spar, NP; Samuel Jester, NP 03/05/2017 11:10 AM  Other:  03/05/2017 11:10 AM  Other: 03/05/2017 11:10 AM   Scribe for Treatment Team: Georga Kaufmann, MSW,LCSWA 03/05/2017 11:10 AM

## 2017-03-06 ENCOUNTER — Encounter (HOSPITAL_COMMUNITY): Payer: Self-pay | Admitting: Psychiatry

## 2017-03-06 ENCOUNTER — Ambulatory Visit (INDEPENDENT_AMBULATORY_CARE_PROVIDER_SITE_OTHER): Payer: Medicare Other | Admitting: Psychiatry

## 2017-03-06 VITALS — BP 142/77 | HR 95 | Ht 65.0 in | Wt 210.2 lb

## 2017-03-06 DIAGNOSIS — Z79899 Other long term (current) drug therapy: Secondary | ICD-10-CM | POA: Diagnosis not present

## 2017-03-06 DIAGNOSIS — F332 Major depressive disorder, recurrent severe without psychotic features: Secondary | ICD-10-CM

## 2017-03-06 MED ORDER — BUSPIRONE HCL 10 MG PO TABS: 10.0000 mg | ORAL_TABLET | Freq: Three times a day (TID) | ORAL | 2 refills | Status: DC

## 2017-03-06 MED ORDER — CLONAZEPAM 0.5 MG PO TABS: 0.5000 mg | ORAL_TABLET | Freq: Two times a day (BID) | ORAL | 2 refills | Status: DC

## 2017-03-06 MED ORDER — ARIPIPRAZOLE 2 MG PO TABS: 2.0000 mg | ORAL_TABLET | Freq: Every day | ORAL | 2 refills | Status: DC

## 2017-03-06 MED ORDER — VILAZODONE HCL 40 MG PO TABS: 40.0000 mg | ORAL_TABLET | Freq: Every day | ORAL | 2 refills | Status: DC

## 2017-03-06 MED ORDER — HYDROXYZINE HCL 25 MG PO TABS: 25.0000 mg | ORAL_TABLET | Freq: Three times a day (TID) | ORAL | 2 refills | Status: DC | PRN
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
## 2017-03-07 ENCOUNTER — Ambulatory Visit (INDEPENDENT_AMBULATORY_CARE_PROVIDER_SITE_OTHER): Payer: Medicare Other | Admitting: Psychiatry

## 2017-03-07 DIAGNOSIS — F332 Major depressive disorder, recurrent severe without psychotic features: Secondary | ICD-10-CM | POA: Diagnosis not present

## 2017-03-07 NOTE — Progress Notes (Signed)
  THERAPIST PROGRESS NOTE  Session Time:  Friday 03/07/2017 10:10 AM  -11:00 AM                                     Participation Level: Active  Behavioral Response:  Depressed/talkative,             Type of Therapy: Individual Therapy   Treatment Goals :     1 .Identify replace thoughts that support depression        2. 1ncrease self acceptance decreasing the negative statements about self and increasing positive statements about self       3. Improve ability to manage stress and anxiety with decreased intensity and frequency as of anxiety response (panic attacks, avoidance)       4. Improve interpersonal skills  Treatment Goals addressed  1,2   Interventions: CBT and Supportive,ACT       Summary: [name] is a 51 y.o. female who presents with a long-standing history of chronic recurrent severe depressive symptoms accompanied by chronic anxiety. She has had multiple hospitalizations due to to suicidal attempts and depression. Her current symptoms include depressed mood, anxiety, excessive worrying, panic attacks, and passive suicidal ideations.   Patient last was seen about 2 weeks ago. She was hospitalized this past weekend due to suicidal ideations and was discharged this past Wednesday. She says this was triggered by learning her daughter was cutting and doing poorly in school and loss of the relationship with 2 of her friends. She denies any suicidal ideations since discharge. She reports improved mood and engaging in activities. She reports continued anxiety and reports having a panic attack yesterday. She reports this was triggered by sleep difficulty the previous night. She reports sleeping better last night and feeling better today. She says she has contacted the DBT therapist discussed in last session. However, that therapist referred her to another therapist as she is unable to see patient. Patient reports she has followed up with that therapist and is considering scheduling an  appointment. She also has been referred to day [facility] for an assessment by psychiatrist Dr. Harrington Challenger. She reports strong support from her ex-boyfriend and a cousin.  Suicidal/Homicidal: No.    Therapist Response:  Reviewed symptoms, facilitated expression of feelings, discussed future treatment,  answered patient's questions regarding DBT and discussed pursuing a higher level of care through [facility] as well as seeing DBT therapist in [place], praised and reinforced patient's efforts to engage in activity and use her support system,  .Plan: Patient agrees to return for an appointment in 2 weeks. She will cancel appointment should she schedule appointment with therapist in [place].   Diagnosis: Axis I: MDD, Recurrent    Axis II: Borderline Personality Dis.    Manasvini Whatley, LCSW

## 2017-03-11 ENCOUNTER — Telehealth (HOSPITAL_COMMUNITY): Payer: Self-pay | Admitting: *Deleted

## 2017-03-11 NOTE — Telephone Encounter (Signed)
RMA faxed [place] [facility] release for them to treat pt today on 03-11-2017. Per Dr. Harrington Challenger she wants pt to get their services.

## 2017-03-17 ENCOUNTER — Telehealth (HOSPITAL_COMMUNITY): Payer: Self-pay | Admitting: *Deleted

## 2017-03-17 NOTE — Telephone Encounter (Signed)
Phone call from [place] at [facility].    The patient's Medicaid is only to cover Medicare.    She was denied CST for that reason.

## 2017-03-18 NOTE — Telephone Encounter (Signed)
noted 

## 2017-03-21 ENCOUNTER — Ambulatory Visit (HOSPITAL_COMMUNITY): Payer: Self-pay | Admitting: Psychiatry

## 2017-03-25 ENCOUNTER — Ambulatory Visit (HOSPITAL_COMMUNITY): Payer: Self-pay | Admitting: Psychiatry

## 2017-03-27 ENCOUNTER — Ambulatory Visit (INDEPENDENT_AMBULATORY_CARE_PROVIDER_SITE_OTHER): Payer: Medicare Other | Admitting: Psychiatry

## 2017-03-27 ENCOUNTER — Encounter (HOSPITAL_COMMUNITY): Payer: Self-pay | Admitting: Psychiatry

## 2017-03-27 VITALS — BP 135/74 | HR 85 | Wt 210.6 lb

## 2017-03-27 DIAGNOSIS — Z79899 Other long term (current) drug therapy: Secondary | ICD-10-CM | POA: Diagnosis not present

## 2017-03-27 DIAGNOSIS — F332 Major depressive disorder, recurrent severe without psychotic features: Secondary | ICD-10-CM | POA: Diagnosis not present
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
## 2017-04-22 ENCOUNTER — Telehealth (HOSPITAL_COMMUNITY): Payer: Self-pay | Admitting: *Deleted

## 2017-04-22 NOTE — Telephone Encounter (Signed)
Prior authorization for Viibryd received. Submitted online with cover my meds received message that it was not needed, already on file. Called to notify pharmacy and was told they techs don't always read the messages correctly. The patient was trying to fill too soon and they thought it needed an authorization. Nothing further needed at this time.

## 2017-04-23 ENCOUNTER — Ambulatory Visit (INDEPENDENT_AMBULATORY_CARE_PROVIDER_SITE_OTHER): Payer: Medicare Other | Admitting: Psychiatry

## 2017-04-23 ENCOUNTER — Encounter (HOSPITAL_COMMUNITY): Payer: Self-pay | Admitting: Psychiatry

## 2017-04-23 VITALS — BP 133/67 | HR 84 | Ht 65.0 in | Wt 210.0 lb

## 2017-04-23 DIAGNOSIS — E119 Type 2 diabetes mellitus without complications: Secondary | ICD-10-CM | POA: Diagnosis not present

## 2017-04-23 DIAGNOSIS — Z79899 Other long term (current) drug therapy: Secondary | ICD-10-CM

## 2017-04-23 DIAGNOSIS — F332 Major depressive disorder, recurrent severe without psychotic features: Secondary | ICD-10-CM | POA: Diagnosis not present

## 2017-04-23 DIAGNOSIS — E782 Mixed hyperlipidemia: Secondary | ICD-10-CM | POA: Diagnosis not present

## 2017-04-23 MED ORDER — VILAZODONE HCL 40 MG PO TABS: 40.0000 mg | ORAL_TABLET | Freq: Every day | ORAL | 2 refills | Status: DC

## 2017-04-23 MED ORDER — BUSPIRONE HCL 10 MG PO TABS: 10.0000 mg | ORAL_TABLET | Freq: Three times a day (TID) | ORAL | 2 refills | Status: DC

## 2017-04-23 MED ORDER — ARIPIPRAZOLE 2 MG PO TABS: 2.0000 mg | ORAL_TABLET | Freq: Every day | ORAL | 2 refills | Status: DC

## 2017-04-23 MED ORDER — CLONAZEPAM 0.5 MG PO TABS: 0.5000 mg | ORAL_TABLET | Freq: Two times a day (BID) | ORAL | 2 refills | Status: DC

## 2017-04-23 MED ORDER — HYDROXYZINE HCL 25 MG PO TABS: 25.0000 mg | ORAL_TABLET | Freq: Three times a day (TID) | ORAL | 2 refills | Status: DC | PRN

## 2017-04-23 NOTE — Telephone Encounter (Signed)
noted 
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
## 2017-04-24 DIAGNOSIS — E119 Type 2 diabetes mellitus without complications: Secondary | ICD-10-CM | POA: Diagnosis not present

## 2017-04-24 DIAGNOSIS — F319 Bipolar disorder, unspecified: Secondary | ICD-10-CM | POA: Diagnosis not present

## 2017-04-24 DIAGNOSIS — E782 Mixed hyperlipidemia: Secondary | ICD-10-CM | POA: Diagnosis not present

## 2017-04-24 DIAGNOSIS — I10 Essential (primary) hypertension: Secondary | ICD-10-CM | POA: Diagnosis not present

## 2017-04-24 DIAGNOSIS — R945 Abnormal results of liver function studies: Secondary | ICD-10-CM | POA: Diagnosis not present

## 2017-05-05 ENCOUNTER — Telehealth: Payer: Self-pay

## 2017-05-05 NOTE — Telephone Encounter (Signed)
[phone]  PATIENT RECEIVED LETTER TO SCHEDULE TCS

## 2017-05-06 ENCOUNTER — Telehealth: Payer: Self-pay

## 2017-05-06 NOTE — Telephone Encounter (Signed)
Pt has been scheduled an OV with Neil Crouch, PA on 05/27/2017 at 9:30 AM due to meds before scheduling colonoscopy.

## 2017-05-06 NOTE — Telephone Encounter (Signed)
See separate note.

## 2017-05-06 NOTE — Telephone Encounter (Signed)
Pt said she had just spoken with DS and was calling her back. I told her DS was on another line and I could transfer call to VM, but patient doesn't want to be transferred. She said to call her back at [phone]

## 2017-05-06 NOTE — Telephone Encounter (Signed)
LMOM to call.

## 2017-05-27 ENCOUNTER — Other Ambulatory Visit: Payer: Self-pay

## 2017-05-27 ENCOUNTER — Encounter: Payer: Self-pay | Admitting: Gastroenterology

## 2017-05-27 ENCOUNTER — Telehealth: Payer: Self-pay

## 2017-05-27 ENCOUNTER — Ambulatory Visit (INDEPENDENT_AMBULATORY_CARE_PROVIDER_SITE_OTHER): Payer: Medicare Other | Admitting: Gastroenterology

## 2017-05-27 VITALS — BP 129/86 | HR 94 | Temp 97.0°F | Ht 65.0 in | Wt 209.2 lb

## 2017-05-27 DIAGNOSIS — Z1211 Encounter for screening for malignant neoplasm of colon: Secondary | ICD-10-CM | POA: Diagnosis not present

## 2017-05-27 DIAGNOSIS — R945 Abnormal results of liver function studies: Secondary | ICD-10-CM | POA: Diagnosis not present

## 2017-05-27 DIAGNOSIS — Z79899 Other long term (current) drug therapy: Secondary | ICD-10-CM | POA: Diagnosis not present

## 2017-05-27 DIAGNOSIS — R7989 Other specified abnormal findings of blood chemistry: Secondary | ICD-10-CM

## 2017-05-27 DIAGNOSIS — R748 Abnormal levels of other serum enzymes: Secondary | ICD-10-CM | POA: Diagnosis not present

## 2017-05-27 LAB — IRON AND TIBC
%SAT: 16 % (ref 11–50)
IRON: 68 ug/dL (ref 45–160)
TIBC: 431 ug/dL (ref 250–450)
UIBC: 363 ug/dL

## 2017-05-27 LAB — HEPATIC FUNCTION PANEL
ALBUMIN: 4.1 g/dL (ref 3.6–5.1)
ALT: 52 U/L — ABNORMAL HIGH (ref 6–29)
AST: 76 U/L — ABNORMAL HIGH (ref 10–35)
Alkaline Phosphatase: 84 U/L (ref 33–130)
Bilirubin, Direct: 0.1 mg/dL (ref <=0.2)
Indirect Bilirubin: 0.4 mg/dL (ref 0.2–1.2)
TOTAL PROTEIN: 6.8 g/dL (ref 6.1–8.1)
Total Bilirubin: 0.5 mg/dL (ref 0.2–1.2)

## 2017-05-27 MED ORDER — NA SULFATE-K SULFATE-MG SULF 17.5-3.13-1.6 GM/177ML PO SOLN: 1.0000 | ORAL | 0 refills | Status: DC

## 2017-05-27 NOTE — Progress Notes (Signed)
CC'D TO PCP °

## 2017-05-27 NOTE — Telephone Encounter (Signed)
Called and informed pt of pre-op appt 07/08/17 at 11:00am. Letter also mailed.

## 2017-05-27 NOTE — Assessment & Plan Note (Signed)
51 year old female presenting for first ever colonoscopy. Mother had colon polyps in her 2s. Patient is without GI complaints. Due to polypharmacy and anxiety we will plan on deep sedation in the OR.  I have discussed the risks, alternatives, benefits with regards to but not limited to the risk of reaction to medication, bleeding, infection, perforation and the patient is agreeable to proceed. Written consent to be obtained.

## 2017-05-27 NOTE — Patient Instructions (Addendum)
1. Colonoscopy as scheduled. Please see separate instructions. 2. Please have lab work done as discussed. 3. At this time you do not need further imaging of your liver as it was well seen on CT scan last fall and appeared to be normal.

## 2017-05-27 NOTE — Assessment & Plan Note (Signed)
Intermittent elevated LFTs. CT abdomen and pelvis last fall with normal-appearing liver. Possibly related to medications but we should consider screening for hepatitis B and C as well as hemochromatosis, repeat LFTs now as well.

## 2017-05-27 NOTE — Progress Notes (Addendum)
REVIEWED-NO ADDITIONAL RECOMMENDATIONS.   Primary Care Physician:  Celene Squibb, MD  Primary Gastroenterologist:  Barney Drain, MD   Chief Complaint  Patient presents with  . Colonoscopy    HPI:  [name] is a 51 y.o. female here At the request of PCP to schedule first ever colonoscopy. Chronically she's been doing well. Denies any constipation, diarrhea, melena, rectal bleeding, abdominal pain. She has occasional heartburn well-managed with occasional TUMS. Denies dysphagia. No unintentional weight loss.   She has anxiety. She is also concerned about how much bowel prep she has to drink stating she only drinks about 40 ounces of liquid daily.  She has also had abnormal LFTs, specifically AST. Patient sister had hep C cirrhosis. She would like to be checked.  Current Outpatient Prescriptions  Medication Sig Dispense Refill  . ARIPiprazole (ABILIFY) 2 MG tablet Take 1 tablet (2 mg total) by mouth daily. 30 tablet 2  . Canagliflozin-Metformin HCl (INVOKAMET) 150-500 MG TABS Take 1 tablet by mouth 2 (two) times daily. For diabetes 1 tablet 0  . clonazePAM (KLONOPIN) 0.5 MG tablet Take 1 tablet (0.5 mg total) by mouth 2 (two) times daily. For anxiety 60 tablet 2  . estradiol (ESTRACE) 2 MG tablet Take 1 tablet (2 mg total) by mouth daily. For hormone replacement    . glipiZIDE (GLUCOTROL) 5 MG tablet Take 5 mg by mouth daily before breakfast.    . lisinopril (PRINIVIL,ZESTRIL) 20 MG tablet Take 20 mg by mouth daily.    . medroxyPROGESTERone (PROVERA) 5 MG tablet Take 1 tablet (5 mg total) by mouth daily. For hormone replacement 4 tablet 0  . metoprolol tartrate (LOPRESSOR) 25 MG tablet Take 1 tablet (25 mg total) by mouth 2 (two) times daily. For high blood pressure    . pravastatin (PRAVACHOL) 20 MG tablet Take 40 mg by mouth daily.     . TRADJENTA 5 MG TABS tablet Take 1 tablet (5 mg total) by mouth daily. For diabetes management 1 tablet   . Vilazodone HCl (VIIBRYD) 40 MG TABS  Take 1 tablet (40 mg total) by mouth daily. For depression 30 tablet 2   No current facility-administered medications for this visit.     Allergies as of 05/27/2017 - Review Complete 05/27/2017  Allergen Reaction Noted  . Neurontin [gabapentin] Other (See Comments) 11/02/2012  . Lamictal [lamotrigine] Other (See Comments) 01/14/2013  . Trazodone and nefazodone  03/26/2012    Past Medical History:  Diagnosis Date  . Anxiety   . Arthritis   . Depression   . Diabetes mellitus   . Headache(784.0)   . History of borderline personality disorder   . Hyperlipidemia   . Hypertension   . Personality disorder     Past Surgical History:  Procedure Laterality Date  . FOOT SURGERY    . TOTAL ABDOMINAL HYSTERECTOMY W/ BILATERAL SALPINGOOPHORECTOMY      Family History  Problem Relation Age of Onset  . Depression Mother   . Colon polyps Mother        around age 3  . OCD Other   . Ovarian cancer Sister   . Cirrhosis Sister   . ADD / ADHD Neg Hx   . Alcohol abuse Neg Hx   . Drug abuse Neg Hx   . Anxiety disorder Neg Hx   . Bipolar disorder Neg Hx   . Dementia Neg Hx   . Paranoid behavior Neg Hx   . Schizophrenia Neg Hx   . Seizures Neg Hx   .  Sexual abuse Neg Hx   . Physical abuse Neg Hx   . Colon cancer Neg Hx     Social History   Social History  . Marital status: Divorced    Spouse name: N/A  . Number of children: N/A  . Years of education: N/A   Occupational History  . Not on file.   Social History Main Topics  . Smoking status: Never Smoker  . Smokeless tobacco: Never Used  . Alcohol use Yes     Comment: occ  . Drug use: No  . Sexual activity: Yes    Partners: Male    Birth control/ protection: None, Post-menopausal, Surgical   Other Topics Concern  . Not on file   Social History Narrative  . No narrative on file      ROS:  General: Negative for anorexia, weight loss, fever, chills, fatigue, weakness. Eyes: Negative for vision changes.  ENT:  Negative for hoarseness, difficulty swallowing , nasal congestion. CV: Negative for chest pain, angina, palpitations, dyspnea on exertion, peripheral edema.  Respiratory: Negative for dyspnea at rest, dyspnea on exertion, cough, sputum, wheezing.  GI: See history of present illness. GU:  Negative for dysuria, hematuria, urinary incontinence, urinary frequency, nocturnal urination.  MS: Negative for joint pain, low back pain.  Derm: Negative for rash or itching.  Neuro: Negative for weakness, abnormal sensation, seizure, frequent headaches, memory loss, confusion.  Psych: + anxiety, depression, No suicidal ideation, hallucinations.  Endo: Negative for unusual weight change.  Heme: Negative for bruising or bleeding. Allergy: Negative for rash or hives.    Physical Examination:  BP 129/86   Pulse 94   Temp 97 F (36.1 C) (Oral)   Ht 5\' 5"  (1.651 m)   Wt 209 lb 3.2 oz (94.9 kg)   BMI 34.81 kg/m    General: Well-nourished, well-developed in no acute distress.  Head: Normocephalic, atraumatic.   Eyes: Conjunctiva pink, no icterus. Mouth: Oropharyngeal mucosa moist and pink , no lesions erythema or exudate. Neck: Supple without thyromegaly, masses, or lymphadenopathy.  Lungs: Clear to auscultation bilaterally.  Heart: Regular rate and rhythm, no murmurs rubs or gallops.  Abdomen: Bowel sounds are normal, nontender, nondistended, no hepatosplenomegaly or masses, no abdominal bruits or    hernia , no rebound or guarding.   Rectal: Not performed Extremities: No lower extremity edema. No clubbing or deformities.  Neuro: Alert and oriented x 4 , grossly normal neurologically.  Skin: Warm and dry, no rash or jaundice.   Psych: Alert and cooperative, normal mood and affect.  Labs: Lab Results  Component Value Date   WBC 11.3 (H) 03/01/2017   HGB 15.5 (H) 03/01/2017   HCT 46.4 (H) 03/01/2017   MCV 81.7 03/01/2017   PLT 238 03/01/2017   Lab Results  Component Value Date   CREATININE  0.95 03/01/2017   BUN 14 03/01/2017   NA 138 03/01/2017   K 3.7 03/01/2017   CL 103 03/01/2017   CO2 22 03/01/2017   Lab Results  Component Value Date   ALT 52 03/01/2017   AST 57 (H) 03/01/2017   ALKPHOS 93 03/01/2017   BILITOT 0.6 03/01/2017     Imaging Studies: No results found.

## 2017-05-28 LAB — HEPATITIS B CORE ANTIBODY, TOTAL: HEP B C TOTAL AB: NONREACTIVE

## 2017-05-28 LAB — HEPATITIS C ANTIBODY: HCV Ab: NEGATIVE

## 2017-05-28 LAB — HEPATITIS B SURFACE ANTIBODY,QUALITATIVE: Hep B S Ab: NEGATIVE

## 2017-05-28 LAB — FERRITIN: Ferritin: 184 ng/mL (ref 10–232)

## 2017-05-28 LAB — HEPATITIS B SURFACE ANTIGEN: HEP B S AG: NEGATIVE

## 2017-06-02 ENCOUNTER — Ambulatory Visit (HOSPITAL_COMMUNITY): Payer: Self-pay | Admitting: Psychiatry

## 2017-06-02 NOTE — Progress Notes (Signed)
Please let patient know that her hepatitis C, hepatitis B labs were all negative. Iron studies normal. AST and ALT 76/52 respectively. This is minimal to moderately elevated.  She really needs to avoid all alcohol. Etiology of abnormal LFTs unclear at this time, possibly related to medication.  Need to recheck LFTs again in 3 months. If more that time would consider further evaluation.

## 2017-06-03 ENCOUNTER — Other Ambulatory Visit: Payer: Self-pay

## 2017-06-03 DIAGNOSIS — R7989 Other specified abnormal findings of blood chemistry: Secondary | ICD-10-CM

## 2017-06-03 DIAGNOSIS — R945 Abnormal results of liver function studies: Principal | ICD-10-CM

## 2017-06-03 NOTE — Progress Notes (Signed)
LMOM to call.

## 2017-06-03 NOTE — Progress Notes (Signed)
PT is aware and lab orders on file for 3 months.   Pt also said that her PCP said some of her meds might be causing the elevated LFT's.   She will let him know and see what he advises!

## 2017-06-12 ENCOUNTER — Encounter (HOSPITAL_COMMUNITY): Payer: Self-pay | Admitting: Psychiatry

## 2017-06-12 ENCOUNTER — Ambulatory Visit (INDEPENDENT_AMBULATORY_CARE_PROVIDER_SITE_OTHER): Payer: Medicare Other | Admitting: Psychiatry

## 2017-06-12 VITALS — BP 114/78 | HR 88 | Ht 65.0 in | Wt 211.0 lb

## 2017-06-12 DIAGNOSIS — Z79899 Other long term (current) drug therapy: Secondary | ICD-10-CM | POA: Diagnosis not present

## 2017-06-12 DIAGNOSIS — Z79890 Hormone replacement therapy (postmenopausal): Secondary | ICD-10-CM

## 2017-06-12 DIAGNOSIS — Z7984 Long term (current) use of oral hypoglycemic drugs: Secondary | ICD-10-CM

## 2017-06-12 DIAGNOSIS — F329 Major depressive disorder, single episode, unspecified: Secondary | ICD-10-CM

## 2017-06-12 DIAGNOSIS — F332 Major depressive disorder, recurrent severe without psychotic features: Secondary | ICD-10-CM

## 2017-06-12 MED ORDER — CLONAZEPAM 0.5 MG PO TABS: 0.5000 mg | ORAL_TABLET | Freq: Two times a day (BID) | ORAL | 2 refills | Status: DC

## 2017-06-12 MED ORDER — VILAZODONE HCL 40 MG PO TABS: 40.0000 mg | ORAL_TABLET | Freq: Every day | ORAL | 2 refills | Status: DC

## 2017-06-12 MED ORDER — ARIPIPRAZOLE 2 MG PO TABS: 2.0000 mg | ORAL_TABLET | Freq: Every day | ORAL | 2 refills | Status: DC
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
## 2017-06-27 NOTE — Patient Instructions (Signed)
[name]  06/27/2017     @PREFPERIOPPHARMACY @   Your procedure is scheduled on   07/15/2017  Report to [facility] at  900  A.M.  Call this number if you have problems the morning of surgery:  [phone]   Remember:  Do not eat food or drink liquids after midnight.  Take these medicines the morning of surgery with A SIP OF WATER  Abilify, klonopin, lisinopril, metoprolol.   Do not wear jewelry, make-up or nail polish.  Do not wear lotions, powders, or perfumes, or deoderant.  Do not shave 48 hours prior to surgery.  Men may shave face and neck.  Do not bring valuables to the hospital.  [facility] is not responsible for any belongings or valuables.  Contacts, dentures or bridgework may not be worn into surgery.  Leave your suitcase in the car.  After surgery it may be brought to your room.  For patients admitted to the hospital, discharge time will be determined by your treatment team.  Patients discharged the day of surgery will not be allowed to drive home.   Name and phone number of your driver:   family Special instructions:  Follow the diet and prep instructions given to you by Dr Nona Dell office. Please read over the following fact sheets that you were given. Anesthesia Post-op Instructions and Care and Recovery After Surgery       Colonoscopy, Adult A colonoscopy is an exam to look at the entire large intestine. During the exam, a lubricated, bendable tube is inserted into the anus and then passed into the rectum, colon, and other parts of the large intestine. A colonoscopy is often done as a part of normal colorectal screening or in response to certain symptoms, such as anemia, persistent diarrhea, abdominal pain, and blood in the stool. The exam can help screen for and diagnose medical problems, including:  Tumors.  Polyps.  Inflammation.  Areas of bleeding.  Tell a health care provider about:  Any allergies you have.  All medicines  you are taking, including vitamins, herbs, eye drops, creams, and over-the-counter medicines.  Any problems you or family members have had with anesthetic medicines.  Any blood disorders you have.  Any surgeries you have had.  Any medical conditions you have.  Any problems you have had passing stool. What are the risks? Generally, this is a safe procedure. However, problems may occur, including:  Bleeding.  A tear in the intestine.  A reaction to medicines given during the exam.  Infection (rare).  What happens before the procedure? Eating and drinking restrictions Follow instructions from your health care provider about eating and drinking, which may include:  A few days before the procedure - follow a low-fiber diet. Avoid nuts, seeds, dried fruit, raw fruits, and vegetables.  1-3 days before the procedure - follow a clear liquid diet. Drink only clear liquids, such as clear broth or bouillon, black coffee or tea, clear juice, clear soft drinks or sports drinks, gelatin dessert, and popsicles. Avoid any liquids that contain red or purple dye.  On the day of the procedure - do not eat or drink anything during the 2 hours before the procedure, or within the time period that your health care provider recommends.  Bowel prep If you were prescribed an oral bowel prep to clean out your colon:  Take it as told by your health care provider. Starting the day before your procedure,  you will need to drink a large amount of medicated liquid. The liquid will cause you to have multiple loose stools until your stool is almost clear or light green.  If your skin or anus gets irritated from diarrhea, you may use these to relieve the irritation: ? Medicated wipes, such as adult wet wipes with aloe and vitamin E. ? A skin soothing-product like petroleum jelly.  If you vomit while drinking the bowel prep, take a break for up to 60 minutes and then begin the bowel prep again. If vomiting  continues and you cannot take the bowel prep without vomiting, call your health care provider.  General instructions  Ask your health care provider about changing or stopping your regular medicines. This is especially important if you are taking diabetes medicines or blood thinners.  Plan to have someone take you home from the hospital or clinic. What happens during the procedure?  An IV tube may be inserted into one of your veins.  You will be given medicine to help you relax (sedative).  To reduce your risk of infection: ? Your health care team will wash or sanitize their hands. ? Your anal area will be washed with soap.  You will be asked to lie on your side with your knees bent.  Your health care provider will lubricate a long, thin, flexible tube. The tube will have a camera and a light on the end.  The tube will be inserted into your anus.  The tube will be gently eased through your rectum and colon.  Air will be delivered into your colon to keep it open. You may feel some pressure or cramping.  The camera will be used to take images during the procedure.  A small tissue sample may be removed from your body to be examined under a microscope (biopsy). If any potential problems are found, the tissue will be sent to a lab for testing.  If small polyps are found, your health care provider may remove them and have them checked for cancer cells.  The tube that was inserted into your anus will be slowly removed. The procedure may vary among health care providers and hospitals. What happens after the procedure?  Your blood pressure, heart rate, breathing rate, and blood oxygen level will be monitored until the medicines you were given have worn off.  Do not drive for 24 hours after the exam.  You may have a small amount of blood in your stool.  You may pass gas and have mild abdominal cramping or bloating due to the air that was used to inflate your colon during the  exam.  It is up to you to get the results of your procedure. Ask your health care provider, or the department performing the procedure, when your results will be ready. This information is not intended to replace advice given to you by your health care provider. Make sure you discuss any questions you have with your health care provider. Document Released: 12/13/2000 Document Revised: 10/16/2016 Document Reviewed: 02/27/2016 Elsevier Interactive Patient Education  2018 [employer].  Colonoscopy, Adult, Care After This sheet gives you information about how to care for yourself after your procedure. Your health care provider may also give you more specific instructions. If you have problems or questions, contact your health care provider. What can I expect after the procedure? After the procedure, it is common to have:  A small amount of blood in your stool for 24 hours after the procedure.  Some gas.  Mild abdominal cramping or bloating.  Follow these instructions at home: General instructions   For the first 24 hours after the procedure: ? Do not drive or use machinery. ? Do not sign important documents. ? Do not drink alcohol. ? Do your regular daily activities at a slower pace than normal. ? Eat soft, easy-to-digest foods. ? Rest often.  Take over-the-counter or prescription medicines only as told by your health care provider.  It is up to you to get the results of your procedure. Ask your health care provider, or the department performing the procedure, when your results will be ready. Relieving cramping and bloating  Try walking around when you have cramps or feel bloated.  Apply heat to your abdomen as told by your health care provider. Use a heat source that your health care provider recommends, such as a moist heat pack or a heating pad. ? Place a towel between your skin and the heat source. ? Leave the heat on for 20-30 minutes. ? Remove the heat if your skin turns  bright red. This is especially important if you are unable to feel pain, heat, or cold. You may have a greater risk of getting burned. Eating and drinking  Drink enough fluid to keep your urine clear or pale yellow.  Resume your normal diet as instructed by your health care provider. Avoid heavy or fried foods that are hard to digest.  Avoid drinking alcohol for as long as instructed by your health care provider. Contact a health care provider if:  You have blood in your stool 2-3 days after the procedure. Get help right away if:  You have more than a small spotting of blood in your stool.  You pass large blood clots in your stool.  Your abdomen is swollen.  You have nausea or vomiting.  You have a fever.  You have increasing abdominal pain that is not relieved with medicine. This information is not intended to replace advice given to you by your health care provider. Make sure you discuss any questions you have with your health care provider. Document Released: 07/30/2004 Document Revised: 09/09/2016 Document Reviewed: 02/27/2016 Elsevier Interactive Patient Education  2018 [employer] Anesthesia is a term that refers to techniques, procedures, and medicines that help a person stay safe and comfortable during a medical procedure. Monitored anesthesia care, or sedation, is one type of anesthesia. Your anesthesia specialist may recommend sedation if you will be having a procedure that does not require you to be unconscious, such as:  Cataract surgery.  A dental procedure.  A biopsy.  A colonoscopy.  During the procedure, you may receive a medicine to help you relax (sedative). There are three levels of sedation:  Mild sedation. At this level, you may feel awake and relaxed. You will be able to follow directions.  Moderate sedation. At this level, you will be sleepy. You may not remember the procedure.  Deep sedation. At this level, you will be  asleep. You will not remember the procedure.  The more medicine you are given, the deeper your level of sedation will be. Depending on how you respond to the procedure, the anesthesia specialist may change your level of sedation or the type of anesthesia to fit your needs. An anesthesia specialist will monitor you closely during the procedure. Let your health care provider know about:  Any allergies you have.  All medicines you are taking, including vitamins, herbs, eye drops, creams, and  over-the-counter medicines.  Any use of steroids (by mouth or as a cream).  Any problems you or family members have had with sedatives and anesthetic medicines.  Any blood disorders you have.  Any surgeries you have had.  Any medical conditions you have, such as sleep apnea.  Whether you are pregnant or may be pregnant.  Any use of cigarettes, alcohol, or street drugs. What are the risks? Generally, this is a safe procedure. However, problems may occur, including:  Getting too much medicine (oversedation).  Nausea.  Allergic reaction to medicines.  Trouble breathing. If this happens, a breathing tube may be used to help with breathing. It will be removed when you are awake and breathing on your own.  Heart trouble.  Lung trouble.  Before the procedure Staying hydrated Follow instructions from your health care provider about hydration, which may include:  Up to 2 hours before the procedure - you may continue to drink clear liquids, such as water, clear fruit juice, black coffee, and plain tea.  Eating and drinking restrictions Follow instructions from your health care provider about eating and drinking, which may include:  8 hours before the procedure - stop eating heavy meals or foods such as meat, fried foods, or fatty foods.  6 hours before the procedure - stop eating light meals or foods, such as toast or cereal.  6 hours before the procedure - stop drinking milk or drinks that  contain milk.  2 hours before the procedure - stop drinking clear liquids.  Medicines Ask your health care provider about:  Changing or stopping your regular medicines. This is especially important if you are taking diabetes medicines or blood thinners.  Taking medicines such as aspirin and ibuprofen. These medicines can thin your blood. Do not take these medicines before your procedure if your health care provider instructs you not to.  Tests and exams  You will have a physical exam.  You may have blood tests done to show: ? How well your kidneys and liver are working. ? How well your blood can clot.  General instructions  Plan to have someone take you home from the hospital or clinic.  If you will be going home right after the procedure, plan to have someone with you for 24 hours.  What happens during the procedure?  Your blood pressure, heart rate, breathing, level of pain and overall condition will be monitored.  An IV tube will be inserted into one of your veins.  Your anesthesia specialist will give you medicines as needed to keep you comfortable during the procedure. This may mean changing the level of sedation.  The procedure will be performed. After the procedure  Your blood pressure, heart rate, breathing rate, and blood oxygen level will be monitored until the medicines you were given have worn off.  Do not drive for 24 hours if you received a sedative.  You may: ? Feel sleepy, clumsy, or nauseous. ? Feel forgetful about what happened after the procedure. ? Have a sore throat if you had a breathing tube during the procedure. ? Vomit. This information is not intended to replace advice given to you by your health care provider. Make sure you discuss any questions you have with your health care provider. Document Released: 09/11/2005 Document Revised: 05/24/2016 Document Reviewed: 04/07/2016 Elsevier Interactive Patient Education  2018 [employer], Care After These instructions provide you with information about caring for yourself after your procedure. Your health care provider may also  give you more specific instructions. Your treatment has been planned according to current medical practices, but problems sometimes occur. Call your health care provider if you have any problems or questions after your procedure. What can I expect after the procedure? After your procedure, it is common to:  Feel sleepy for several hours.  Feel clumsy and have poor balance for several hours.  Feel forgetful about what happened after the procedure.  Have poor judgment for several hours.  Feel nauseous or vomit.  Have a sore throat if you had a breathing tube during the procedure.  Follow these instructions at home: For at least 24 hours after the procedure:   Do not: ? Participate in activities in which you could fall or become injured. ? Drive. ? Use heavy machinery. ? Drink alcohol. ? Take sleeping pills or medicines that cause drowsiness. ? Make important decisions or sign legal documents. ? Take care of children on your own.  Rest. Eating and drinking  Follow the diet that is recommended by your health care provider.  If you vomit, drink water, juice, or soup when you can drink without vomiting.  Make sure you have little or no nausea before eating solid foods. General instructions  Have a responsible adult stay with you until you are awake and alert.  Take over-the-counter and prescription medicines only as told by your health care provider.  If you smoke, do not smoke without supervision.  Keep all follow-up visits as told by your health care provider. This is important. Contact a health care provider if:  You keep feeling nauseous or you keep vomiting.  You feel light-headed.  You develop a rash.  You have a fever. Get help right away if:  You have trouble breathing. This information is not  intended to replace advice given to you by your health care provider. Make sure you discuss any questions you have with your health care provider. Document Released: 04/07/2016 Document Revised: 08/07/2016 Document Reviewed: 04/07/2016 Elsevier Interactive Patient Education  [employer].

## 2017-07-08 ENCOUNTER — Encounter (HOSPITAL_COMMUNITY)
Admission: RE | Admit: 2017-07-08 | Discharge: 2017-07-08 | Disposition: A | Discharge status: Home / Self Care | Payer: Medicare Other | Source: Ambulatory Visit | Attending: Gastroenterology | Admitting: Gastroenterology

## 2017-07-08 ENCOUNTER — Encounter (HOSPITAL_COMMUNITY): Payer: Self-pay

## 2017-07-08 DIAGNOSIS — Z01812 Encounter for preprocedural laboratory examination: Secondary | ICD-10-CM | POA: Diagnosis not present

## 2017-07-08 LAB — BASIC METABOLIC PANEL
ANION GAP: 12 (ref 5–15)
BUN: 15 mg/dL (ref 6–20)
CALCIUM: 9 mg/dL (ref 8.9–10.3)
CO2: 20 mmol/L — ABNORMAL LOW (ref 22–32)
CREATININE: 0.83 mg/dL (ref 0.44–1.00)
Chloride: 105 mmol/L (ref 101–111)
Glucose, Bld: 243 mg/dL — ABNORMAL HIGH (ref 65–99)
Potassium: 3.6 mmol/L (ref 3.5–5.1)
SODIUM: 137 mmol/L (ref 135–145)

## 2017-07-08 LAB — CBC
HEMATOCRIT: 43.4 % (ref 36.0–46.0)
Hemoglobin: 14.2 g/dL (ref 12.0–15.0)
MCH: 26.8 pg (ref 26.0–34.0)
MCHC: 32.7 g/dL (ref 30.0–36.0)
MCV: 82 fL (ref 78.0–100.0)
Platelets: 166 10*3/uL (ref 150–400)
RBC: 5.29 MIL/uL — AB (ref 3.87–5.11)
RDW: 14 % (ref 11.5–15.5)
WBC: 7.5 10*3/uL (ref 4.0–10.5)

## 2017-07-08 NOTE — Progress Notes (Signed)
PT is aware of result of BS. Said she had only had crackers and water. She is aware to keep close check and follow up with PCP. Said she has appt with him in the next couple of weeks.

## 2017-07-08 NOTE — Progress Notes (Signed)
Sending FYI to Leslie  Lewis, PA.  

## 2017-07-08 NOTE — Progress Notes (Signed)
Please let patient know her blood glucose is 243. Not clear if fasting.

## 2017-07-15 ENCOUNTER — Ambulatory Visit (HOSPITAL_COMMUNITY): Payer: Medicare Other | Admitting: Anesthesiology

## 2017-07-15 ENCOUNTER — Encounter (HOSPITAL_COMMUNITY): Payer: Self-pay

## 2017-07-15 ENCOUNTER — Encounter (HOSPITAL_COMMUNITY)
Admission: RE | Disposition: A | Discharge status: Home / Self Care | Payer: Self-pay | Source: Ambulatory Visit | Attending: Gastroenterology

## 2017-07-15 ENCOUNTER — Ambulatory Visit (HOSPITAL_COMMUNITY)
Admission: RE | Admit: 2017-07-15 | Discharge: 2017-07-15 | Disposition: A | Discharge status: Home / Self Care | Payer: Medicare Other | Source: Ambulatory Visit | Attending: Gastroenterology | Admitting: Gastroenterology

## 2017-07-15 DIAGNOSIS — E785 Hyperlipidemia, unspecified: Secondary | ICD-10-CM | POA: Insufficient documentation

## 2017-07-15 DIAGNOSIS — F329 Major depressive disorder, single episode, unspecified: Secondary | ICD-10-CM | POA: Diagnosis not present

## 2017-07-15 DIAGNOSIS — Q438 Other specified congenital malformations of intestine: Secondary | ICD-10-CM | POA: Insufficient documentation

## 2017-07-15 DIAGNOSIS — Z1212 Encounter for screening for malignant neoplasm of rectum: Secondary | ICD-10-CM | POA: Diagnosis not present

## 2017-07-15 DIAGNOSIS — K648 Other hemorrhoids: Secondary | ICD-10-CM | POA: Insufficient documentation

## 2017-07-15 DIAGNOSIS — K644 Residual hemorrhoidal skin tags: Secondary | ICD-10-CM | POA: Diagnosis not present

## 2017-07-15 DIAGNOSIS — Z90710 Acquired absence of both cervix and uterus: Secondary | ICD-10-CM | POA: Diagnosis not present

## 2017-07-15 DIAGNOSIS — Z888 Allergy status to other drugs, medicaments and biological substances status: Secondary | ICD-10-CM | POA: Insufficient documentation

## 2017-07-15 DIAGNOSIS — I10 Essential (primary) hypertension: Secondary | ICD-10-CM | POA: Insufficient documentation

## 2017-07-15 DIAGNOSIS — Z8371 Family history of colonic polyps: Secondary | ICD-10-CM | POA: Diagnosis not present

## 2017-07-15 DIAGNOSIS — E119 Type 2 diabetes mellitus without complications: Secondary | ICD-10-CM | POA: Insufficient documentation

## 2017-07-15 DIAGNOSIS — F603 Borderline personality disorder: Secondary | ICD-10-CM | POA: Diagnosis not present

## 2017-07-15 DIAGNOSIS — D122 Benign neoplasm of ascending colon: Secondary | ICD-10-CM | POA: Diagnosis not present

## 2017-07-15 DIAGNOSIS — F419 Anxiety disorder, unspecified: Secondary | ICD-10-CM | POA: Insufficient documentation

## 2017-07-15 DIAGNOSIS — Z1211 Encounter for screening for malignant neoplasm of colon: Secondary | ICD-10-CM | POA: Diagnosis not present

## 2017-07-15 DIAGNOSIS — Z7984 Long term (current) use of oral hypoglycemic drugs: Secondary | ICD-10-CM | POA: Insufficient documentation

## 2017-07-15 DIAGNOSIS — Z79899 Other long term (current) drug therapy: Secondary | ICD-10-CM | POA: Diagnosis not present

## 2017-07-15 HISTORY — PX: POLYPECTOMY: SHX5525

## 2017-07-15 HISTORY — PX: COLONOSCOPY WITH PROPOFOL: SHX5780

## 2017-07-15 LAB — GLUCOSE, CAPILLARY: GLUCOSE-CAPILLARY: 159 mg/dL — AB (ref 65–99)

## 2017-07-15 SURGERY — COLONOSCOPY WITH PROPOFOL
Anesthesia: Monitor Anesthesia Care

## 2017-07-15 MED ORDER — PROPOFOL 10 MG/ML IV BOLUS
INTRAVENOUS | Status: AC
  Filled 2017-07-15: qty 80

## 2017-07-15 MED ORDER — PROPOFOL 10 MG/ML IV BOLUS
INTRAVENOUS | Status: DC | PRN
  Administered 2017-07-15: 20 mg via INTRAVENOUS

## 2017-07-15 MED ORDER — LIDOCAINE HCL (CARDIAC) 10 MG/ML IV SOLN
INTRAVENOUS | Status: DC | PRN
  Administered 2017-07-15: 50 mg via INTRAVENOUS

## 2017-07-15 MED ORDER — MIDAZOLAM HCL 2 MG/2ML IJ SOLN
INTRAMUSCULAR | Status: AC
  Filled 2017-07-15: qty 2

## 2017-07-15 MED ORDER — PHENYLEPHRINE HCL 10 MG/ML IJ SOLN
INTRAMUSCULAR | Status: DC | PRN
  Administered 2017-07-15: 40 ug via INTRAVENOUS

## 2017-07-15 MED ORDER — FENTANYL CITRATE (PF) 100 MCG/2ML IJ SOLN
INTRAMUSCULAR | Status: AC
  Filled 2017-07-15: qty 2

## 2017-07-15 MED ORDER — PHENYLEPHRINE 40 MCG/ML (10ML) SYRINGE FOR IV PUSH (FOR BLOOD PRESSURE SUPPORT)
PREFILLED_SYRINGE | INTRAVENOUS | Status: AC
  Filled 2017-07-15: qty 10

## 2017-07-15 MED ORDER — PROPOFOL 500 MG/50ML IV EMUL
INTRAVENOUS | Status: DC | PRN
  Administered 2017-07-15: 125 ug/kg/min via INTRAVENOUS

## 2017-07-15 MED ORDER — LACTATED RINGERS IV SOLN
INTRAVENOUS | Status: DC
  Administered 2017-07-15: 10:00:00 via INTRAVENOUS

## 2017-07-15 MED ORDER — CHLORHEXIDINE GLUCONATE CLOTH 2 % EX PADS: 6.0000 | MEDICATED_PAD | Freq: Once | CUTANEOUS | Status: DC

## 2017-07-15 MED ORDER — MIDAZOLAM HCL 2 MG/2ML IJ SOLN
1.0000 mg | INTRAMUSCULAR | Status: AC
  Administered 2017-07-15: 2 mg via INTRAVENOUS

## 2017-07-15 MED ORDER — FENTANYL CITRATE (PF) 100 MCG/2ML IJ SOLN
25.0000 ug | Freq: Once | INTRAMUSCULAR | Status: AC
  Administered 2017-07-15: 25 ug via INTRAVENOUS

## 2017-07-15 NOTE — Anesthesia Postprocedure Evaluation (Signed)
Anesthesia Post Note  Patient: [name]  Procedure(s) Performed: Procedure(s) (LRB): COLONOSCOPY WITH PROPOFOL (N/A) POLYPECTOMY  Patient location during evaluation: PACU Anesthesia Type: MAC Level of consciousness: awake and alert Pain management: satisfactory to patient Vital Signs Assessment: post-procedure vital signs reviewed and stable Respiratory status: spontaneous breathing Cardiovascular status: stable Postop Assessment: no signs of nausea or vomiting Anesthetic complications: no     Last Vitals:  Vitals:   07/15/17 1046 07/15/17 1100  BP: 113/70 123/67  Pulse:    Resp: 16 20  Temp: 36.7 C     Last Pain: There were no vitals filed for this visit.               Drucie Opitz

## 2017-07-15 NOTE — Discharge Instructions (Signed)
You had 1 polyp removed. You have internal hemorrhoids.   CONTINUE YOUR WEIGHT LOSS EFFORTS. LOSE TEN POUNDS.  DRINK WATER TO KEEP YOUR URINE LIGHT YELLOW.  FOLLOW A HIGH FIBER DIET. AVOID ITEMS THAT CAUSE BLOATING & GAS. SEE INFO BELOW.  YOUR BIOPSY RESULTS WILL BE AVAILABLE IN MY CHART AFTER JUL AND MY OFFICE WILL CONTACT YOU IN 10-14 DAYS WITH YOUR RESULTS.   Next colonoscopy in 5-10 years.    Colonoscopy Care After Read the instructions outlined below and refer to this sheet in the next week. These discharge instructions provide you with general information on caring for yourself after you leave the hospital. While your treatment has been planned according to the most current medical practices available, unavoidable complications occasionally occur. If you have any problems or questions after discharge, call DR. Shaquayla Klimas, [phone].  ACTIVITY  You may resume your regular activity, but move at a slower pace for the next 24 hours.   Take frequent rest periods for the next 24 hours.   Walking will help get rid of the air and reduce the bloated feeling in your belly (abdomen).   No driving for 24 hours (because of the medicine (anesthesia) used during the test).   You may shower.   Do not sign any important legal documents or operate any machinery for 24 hours (because of the anesthesia used during the test).    NUTRITION  Drink plenty of fluids.   You may resume your normal diet as instructed by your doctor.   Begin with a light meal and progress to your normal diet. Heavy or fried foods are harder to digest and may make you feel sick to your stomach (nauseated).   Avoid alcoholic beverages for 24 hours or as instructed.    MEDICATIONS  You may resume your normal medications.   WHAT YOU CAN EXPECT TODAY  Some feelings of bloating in the abdomen.   Passage of more gas than usual.   Spotting of blood in your stool or on the toilet paper  .  IF YOU HAD POLYPS  REMOVED DURING THE COLONOSCOPY:  Eat a soft diet IF YOU HAVE NAUSEA, BLOATING, ABDOMINAL PAIN, OR VOMITING.    FINDING OUT THE RESULTS OF YOUR TEST Not all test results are available during your visit. DR. Oneida Alar WILL CALL YOU WITHIN 14 DAYS OF YOUR PROCEDUE WITH YOUR RESULTS. Do not assume everything is normal if you have not heard from DR. Jaquetta Currier, CALL HER OFFICE AT [phone].  SEEK IMMEDIATE MEDICAL ATTENTION AND CALL THE OFFICE: [phone] IF:  You have more than a spotting of blood in your stool.   Your belly is swollen (abdominal distention).   You are nauseated or vomiting.   You have a temperature over 101F.   You have abdominal pain or discomfort that is severe or gets worse throughout the day.   High-Fiber Diet A high-fiber diet changes your normal diet to include more whole grains, legumes, fruits, and vegetables. Changes in the diet involve replacing refined carbohydrates with unrefined foods. The calorie level of the diet is essentially unchanged. The Dietary Reference Intake (recommended amount) for adult males is 38 grams per day. For adult females, it is 25 grams per day. Pregnant and lactating women should consume 28 grams of fiber per day. Fiber is the intact part of a plant that is not broken down during digestion. Functional fiber is fiber that has been isolated from the plant to provide a beneficial effect in the body. PURPOSE  Increase stool bulk.   Ease and regulate bowel movements.   Lower cholesterol.   REDUCE RISK OF COLON CANCER  INDICATIONS THAT YOU NEED MORE FIBER  Constipation and hemorrhoids.   Uncomplicated diverticulosis (intestine condition) and irritable bowel syndrome.   Weight management.   As a protective measure against hardening of the arteries (atherosclerosis), diabetes, and cancer.   GUIDELINES FOR INCREASING FIBER IN THE DIET  Start adding fiber to the diet slowly. A gradual increase of about 5 more grams (2 slices of  whole-wheat bread, 2 servings of most fruits or vegetables, or 1 bowl of high-fiber cereal) per day is best. Too rapid an increase in fiber may result in constipation, flatulence, and bloating.   Drink enough water and fluids to keep your urine clear or pale yellow. Water, juice, or caffeine-free drinks are recommended. Not drinking enough fluid may cause constipation.   Eat a variety of high-fiber foods rather than one type of fiber.   Try to increase your intake of fiber through using high-fiber foods rather than fiber pills or supplements that contain small amounts of fiber.   The goal is to change the types of food eaten. Do not supplement your present diet with high-fiber foods, but replace foods in your present diet.   INCLUDE A VARIETY OF FIBER SOURCES  Replace refined and processed grains with whole grains, canned fruits with fresh fruits, and incorporate other fiber sources. White rice, white breads, and most bakery goods contain little or no fiber.   Brown whole-grain rice, buckwheat oats, and many fruits and vegetables are all good sources of fiber. These include: broccoli, Brussels sprouts, cabbage, cauliflower, beets, sweet potatoes, white potatoes (skin on), carrots, tomatoes, eggplant, squash, berries, fresh fruits, and dried fruits.   Cereals appear to be the richest source of fiber. Cereal fiber is found in whole grains and bran. Bran is the fiber-rich outer coat of cereal grain, which is largely removed in refining. In whole-grain cereals, the bran remains. In breakfast cereals, the largest amount of fiber is found in those with "bran" in their names. The fiber content is sometimes indicated on the label.   You may need to include additional fruits and vegetables each day.   In baking, for 1 cup white flour, you may use the following substitutions:   1 cup whole-wheat flour minus 2 tablespoons.   1/2 cup white flour plus 1/2 cup whole-wheat flour.   Polyps, Colon  A polyp  is extra tissue that grows inside your body. Colon polyps grow in the large intestine. The large intestine, also called the colon, is part of your digestive system. It is a long, hollow tube at the end of your digestive tract where your body makes and stores stool. Most polyps are not dangerous. They are benign. This means they are not cancerous. But over time, some types of polyps can turn into cancer. Polyps that are smaller than a pea are usually not harmful. But larger polyps could someday become or may already be cancerous. To be safe, doctors remove all polyps and test them.   WHO GETS POLYPS? Anyone can get polyps, but certain people are more likely than others. You may have a greater chance of getting polyps if:  You are over 50.   You have had polyps before.   Someone in your family has had polyps.   Someone in your family has had cancer of the large intestine.   Find out if someone in your family has had  polyps. You may also be more likely to get polyps if you:   Eat a lot of fatty foods   Smoke   Drink alcohol   Do not exercise  Eat too much   PREVENTION There is not one sure way to prevent polyps. You might be able to lower your risk of getting them if you:  Eat more fruits and vegetables and less fatty food.   Do not smoke.   Avoid alcohol.   Exercise every day.   Lose weight if you are overweight.   Eating more calcium and folate can also lower your risk of getting polyps. Some foods that are rich in calcium are milk, cheese, and broccoli. Some foods that are rich in folate are chickpeas, kidney beans, and spinach.   Hemorrhoids Hemorrhoids are dilated (enlarged) veins around the rectum. Sometimes clots will form in the veins. This makes them swollen and painful. These are called thrombosed hemorrhoids. Causes of hemorrhoids include:  Constipation.   Straining to have a bowel movement.   HEAVY LIFTING  HOME CARE INSTRUCTIONS  Eat a well balanced diet and  drink 6 to 8 glasses of water every day to avoid constipation. You may also use a bulk laxative.   Avoid straining to have bowel movements.   Keep anal area dry and clean.   Do not use a donut shaped pillow or sit on the toilet for long periods. This increases blood pooling and pain.   Move your bowels when your body has the urge; this will require less straining and will decrease pain and pressure.

## 2017-07-15 NOTE — Progress Notes (Signed)
E-signature error. Family member signed under [employer].

## 2017-07-15 NOTE — Transfer of Care (Signed)
Immediate Anesthesia Transfer of Care Note  Patient: [name]  Procedure(s) Performed: Procedure(s) with comments: COLONOSCOPY WITH PROPOFOL (N/A) - 10:30am POLYPECTOMY - ascending polyp hs  Patient Location: PACU  Anesthesia Type:MAC  Level of Consciousness: awake, alert  and pateint uncooperative  Airway & Oxygen Therapy: Patient Spontanous Breathing  Post-op Assessment: Report given to RN and Post -op Vital signs reviewed and stable  Post vital signs: Reviewed and stable  Last Vitals:  Vitals:   07/15/17 0930 07/15/17 0935  BP: (!) 109/57 (!) 118/58  Pulse:    Resp: 20 16  Temp:      Last Pain: There were no vitals filed for this visit.       Complications: No apparent anesthesia complications

## 2017-07-15 NOTE — Anesthesia Preprocedure Evaluation (Signed)
Anesthesia Evaluation  Patient identified by MRN, date of birth, ID band Patient awake    Reviewed: Allergy & Precautions, NPO status , Patient's Chart, lab work & pertinent test results  Airway Mallampati: II  TM Distance: >3 FB     Dental  (+) Teeth Intact   Pulmonary neg pulmonary ROS,    breath sounds clear to auscultation       Cardiovascular hypertension, Pt. on medications  Rhythm:Regular Rate:Normal     Neuro/Psych  Headaches, PSYCHIATRIC DISORDERS ( History of borderline personality disorder) Anxiety Depression    GI/Hepatic negative GI ROS, Neg liver ROS,   Endo/Other  diabetes, Type 2, Oral Hypoglycemic Agents  Renal/GU negative Renal ROS     Musculoskeletal   Abdominal   Peds  Hematology   Anesthesia Other Findings   Reproductive/Obstetrics                             Anesthesia Physical Anesthesia Plan  ASA: II  Anesthesia Plan: MAC   Post-op Pain Management:    Induction: Intravenous  PONV Risk Score and Plan:   Airway Management Planned: Simple Face Mask  Additional Equipment:   Intra-op Plan:   Post-operative Plan:   Informed Consent: I have reviewed the patients History and Physical, chart, labs and discussed the procedure including the risks, benefits and alternatives for the proposed anesthesia with the patient or authorized representative who has indicated his/her understanding and acceptance.     Plan Discussed with:   Anesthesia Plan Comments:         Anesthesia Quick Evaluation

## 2017-07-15 NOTE — H&P (Signed)
Primary Care Physician:  Celene Squibb, MD Primary Gastroenterologist:  Dr. Oneida Alar  Pre-Procedure History & Physical: HPI:  [name] is a 51 y.o. female here for [place].  Past Medical History:  Diagnosis Date  . Anxiety   . Arthritis   . Depression   . Diabetes mellitus   . Headache(784.0)   . History of borderline personality disorder   . Hyperlipidemia   . Hypertension   . Personality disorder     Past Surgical History:  Procedure Laterality Date  . FOOT SURGERY Bilateral   . TOTAL ABDOMINAL HYSTERECTOMY W/ BILATERAL SALPINGOOPHORECTOMY      Prior to Admission medications   Medication Sig Start Date End Date Taking? Authorizing Provider  ARIPiprazole (ABILIFY) 2 MG tablet Take 1 tablet (2 mg total) by mouth daily. Patient taking differently: Take 2 mg by mouth at bedtime.  06/12/17  Yes Cloria Spring, MD  busPIRone (BUSPAR) 10 MG tablet Take 10 mg by mouth daily. 04/06/17  Yes [provider]  Canagliflozin-Metformin HCl (INVOKAMET) 150-500 MG TABS Take 1 tablet by mouth 2 (two) times daily. For diabetes 03/05/17  Yes Nwoko, Herbert Pun I, NP  clonazePAM (KLONOPIN) 0.5 MG tablet Take 1 tablet (0.5 mg total) by mouth 2 (two) times daily. For anxiety 06/12/17  Yes Cloria Spring, MD  estradiol (ESTRACE) 2 MG tablet Take 1 tablet (2 mg total) by mouth daily. For hormone replacement 03/05/17  Yes Lindell Spar I, NP  glipiZIDE (GLUCOTROL XL) 5 MG 24 hr tablet Take 5 mg by mouth daily before breakfast. 05/22/17  Yes [provider]  lisinopril-hydrochlorothiazide (PRINZIDE,ZESTORETIC) 20-25 MG tablet Take 1 tablet by mouth daily. 05/16/17  Yes [provider]  medroxyPROGESTERone (PROVERA) 5 MG tablet Take 1 tablet (5 mg total) by mouth daily. For hormone replacement 03/06/17  Yes Lindell Spar I, NP  metoprolol tartrate (LOPRESSOR) 25 MG tablet Take 1 tablet (25 mg total) by mouth 2 (two) times daily. For high blood pressure 03/05/17  Yes Nwoko, Agnes  I, NP  Na Sulfate-K Sulfate-Mg Sulf (SUPREP BOWEL PREP KIT) 17.5-3.13-1.6 GM/180ML SOLN Take 1 kit by mouth as directed. 05/27/17  Yes Zander Ingham L, MD  naproxen sodium (ANAPROX) 220 MG tablet Take 440 mg by mouth 2 (two) times daily as needed (headache, pain).   Yes [provider]  pravastatin (PRAVACHOL) 40 MG tablet Take 40 mg by mouth daily. 05/16/17  Yes [provider]  rizatriptan (MAXALT) 10 MG tablet Take 10 mg by mouth as needed for migraine. May repeat in 2 hours if needed   Yes [provider]  TRADJENTA 5 MG TABS tablet Take 1 tablet (5 mg total) by mouth daily. For diabetes management 03/05/17  Yes Lindell Spar I, NP  Vilazodone HCl (VIIBRYD) 40 MG TABS Take 1 tablet (40 mg total) by mouth daily. For depression 06/12/17  Yes Cloria Spring, MD    Allergies as of 05/27/2017 - Review Complete 05/27/2017  Allergen Reaction Noted  . Neurontin [gabapentin] Other (See Comments) 11/02/2012  . Lamictal [lamotrigine] Other (See Comments) 01/14/2013  . Trazodone and nefazodone  03/26/2012    Family History  Problem Relation Age of Onset  . Depression Mother   . Colon polyps Mother        around age 35  . OCD Other   . Ovarian cancer Sister   . Cirrhosis Sister   . ADD / ADHD Neg Hx   . Alcohol abuse Neg Hx   .  Drug abuse Neg Hx   . Anxiety disorder Neg Hx   . Bipolar disorder Neg Hx   . Dementia Neg Hx   . Paranoid behavior Neg Hx   . Schizophrenia Neg Hx   . Seizures Neg Hx   . Sexual abuse Neg Hx   . Physical abuse Neg Hx   . Colon cancer Neg Hx     Social History   Social History  . Marital status: Divorced    Spouse name: N/A  . Number of children: N/A  . Years of education: N/A   Occupational History  . Not on file.   Social History Main Topics  . Smoking status: Never Smoker  . Smokeless tobacco: Never Used  . Alcohol use Yes     Comment: occ  . Drug use: No  . Sexual activity: Yes    Partners: Male    Birth control/  protection: None, Post-menopausal, Surgical   Other Topics Concern  . Not on file   Social History Narrative  . No narrative on file    Review of Systems: See HPI, otherwise negative ROS   Physical Exam: BP 123/70   Pulse 86   Temp 97.9 F (36.6 C)   Resp 16   SpO2 95%  General:   Alert,  pleasant and cooperative in NAD Head:  Normocephalic and atraumatic. Neck:  Supple; Lungs:  Clear throughout to auscultation.    Heart:  Regular rate and rhythm. Abdomen:  Soft, nontender and nondistended. Normal bowel sounds, without guarding, and without rebound.   Neurologic:  Alert and  oriented x4;  grossly normal neurologically.  Impression/Plan:     SCREENING  Plan:  1. TCS TODAY. DISCUSSED PROCEDURE, BENEFITS, & RISKS: < 1% chance of medication reaction, bleeding, perforation, or rupture of spleen/liver.

## 2017-07-15 NOTE — Op Note (Signed)
[facility] Patient Name: [name] Procedure Date: 07/15/2017 10:10 AM MRN: [id number] Date of Birth: Jul 28, 1966 Attending MD: Barney Drain , MD CSN: [id number] Age: 51 Admit Type: Outpatient Procedure:                Colonoscopy WITH SNARE POLYPECTOMY Indications:              Screening for colorectal malignant neoplasm Providers:                Barney Drain, MD, Janeece Riggers, RN, Randa Spike,                            Technician Referring MD:             Edwinna Areola. Hall MD Medicines:                Propofol per Anesthesia Complications:            No immediate complications. Estimated Blood Loss:     Estimated blood loss: none. Procedure:                Pre-Anesthesia Assessment:                           - Prior to the procedure, a History and Physical                            was performed, and patient medications and                            allergies were reviewed. The patient's tolerance of                            previous anesthesia was also reviewed. The risks                            and benefits of the procedure and the sedation                            options and risks were discussed with the patient.                            All questions were answered, and informed consent                            was obtained. Prior Anticoagulants: The patient has                            taken no previous anticoagulant or antiplatelet                            agents. ASA Grade Assessment: II - A patient with                            mild systemic disease. After reviewing the risks  and benefits, the patient was deemed in                            satisfactory condition to undergo the procedure.                            After obtaining informed consent, the colonoscope                            was passed under direct vision. Throughout the                            procedure, the patient's blood pressure, pulse, and                      oxygen saturations were monitored continuously. The                            Colonoscope was introduced through the anus and                            advanced to the the cecum, identified by                            appendiceal orifice and ileocecal valve. The                            ileocecal valve, appendiceal orifice, and rectum                            were photographed. The colonoscopy was somewhat                            difficult due to significant looping. Successful                            completion of the procedure was aided by COLOWRAP.                            The quality of the bowel preparation was excellent.                            The patient tolerated the procedure well. Scope In: 10:29:37 AM Scope Out: 10:42:29 AM Scope Withdrawal Time: 0 hours 9 minutes 46 seconds  Total Procedure Duration: 0 hours 12 minutes 52 seconds  Findings:      A 6 mm polyp was found in the proximal ascending colon. The polyp was       sessile. The polyp was removed with a hot snare. Resection and retrieval       were complete.      The recto-sigmoid colon and sigmoid colon were moderately redundant.      External and internal hemorrhoids were found during retroflexion. The       hemorrhoids were moderate. Impression:               - One 6  mm polyp in the proximal ascending colon,                            removed with a hot snare. Resected and retrieved.                           - Redundant colon.                           - External and internal hemorrhoids. Moderate Sedation:      Per Anesthesia Care Recommendation:           - Repeat colonoscopy in 5-10 years for surveillance.                           - High fiber diet.                           - Continue present medications.                           - Await pathology results.                           - Patient has a contact number available for                            emergencies. The  signs and symptoms of potential                            delayed complications were discussed with the                            patient. Return to normal activities tomorrow.                            Written discharge instructions were provided to the                            patient. Procedure Code(s):        --- Professional ---                           [phone], Colonoscopy, flexible; with removal of                            tumor(s), polyp(s), or other lesion(s) by snare                            technique Diagnosis Code(s):        --- Professional ---                           Z12.11, Encounter for screening for malignant                            neoplasm of colon  D12.2, Benign neoplasm of ascending colon                           K64.8, Other hemorrhoids                           Q43.8, Other specified congenital malformations of                            intestine CPT copyright 2016 [employer]. All rights reserved. The codes documented in this report are preliminary and upon coder review may  be revised to meet current compliance requirements. Barney Drain, MD Barney Drain, MD 07/15/2017 10:49:29 AM This report has been signed electronically. Number of Addenda: 0

## 2017-07-16 ENCOUNTER — Telehealth: Payer: Self-pay | Admitting: Gastroenterology

## 2017-07-16 NOTE — Telephone Encounter (Signed)
Please call pt. She had ONE simple adenoma removed from her colon.    CONTINUE YOUR WEIGHT LOSS EFFORTS. LOSE TEN POUNDS.  DRINK WATER TO KEEP YOUR URINE LIGHT YELLOW.  FOLLOW A HIGH FIBER DIET. AVOID ITEMS THAT CAUSE BLOATING & GAS.   Next colonoscopy in 5-10 years.

## 2017-07-17 ENCOUNTER — Encounter (HOSPITAL_COMMUNITY): Payer: Self-pay | Admitting: Gastroenterology

## 2017-07-17 NOTE — Telephone Encounter (Signed)
Patient made aware of results/recommendations. 

## 2017-07-17 NOTE — Telephone Encounter (Signed)
Reminder in epic °

## 2017-07-23 ENCOUNTER — Encounter (HOSPITAL_COMMUNITY): Payer: Self-pay | Admitting: Psychiatry

## 2017-07-23 ENCOUNTER — Ambulatory Visit (INDEPENDENT_AMBULATORY_CARE_PROVIDER_SITE_OTHER): Payer: Medicare Other | Admitting: Psychiatry

## 2017-07-23 VITALS — BP 129/66 | HR 83 | Ht 65.0 in | Wt 209.4 lb

## 2017-07-23 DIAGNOSIS — F332 Major depressive disorder, recurrent severe without psychotic features: Secondary | ICD-10-CM | POA: Diagnosis not present

## 2017-07-23 DIAGNOSIS — I10 Essential (primary) hypertension: Secondary | ICD-10-CM | POA: Diagnosis not present

## 2017-07-23 DIAGNOSIS — E119 Type 2 diabetes mellitus without complications: Secondary | ICD-10-CM | POA: Diagnosis not present

## 2017-07-23 MED ORDER — ARIPIPRAZOLE 2 MG PO TABS: 2.0000 mg | ORAL_TABLET | Freq: Every day | ORAL | 2 refills | Status: AC

## 2017-07-23 MED ORDER — CLONAZEPAM 0.5 MG PO TABS: 0.5000 mg | ORAL_TABLET | Freq: Two times a day (BID) | ORAL | 2 refills | Status: AC

## 2017-07-23 MED ORDER — BUSPIRONE HCL 10 MG PO TABS: 10.0000 mg | ORAL_TABLET | Freq: Every day | ORAL | 2 refills | Status: AC

## 2017-07-23 MED ORDER — VILAZODONE HCL 40 MG PO TABS: 40.0000 mg | ORAL_TABLET | Freq: Every day | ORAL | 2 refills | Status: AC
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
## 2017-07-24 ENCOUNTER — Ambulatory Visit (HOSPITAL_COMMUNITY): Payer: Self-pay | Admitting: Psychiatry

## 2017-07-24 DIAGNOSIS — H43812 Vitreous degeneration, left eye: Secondary | ICD-10-CM | POA: Diagnosis not present

## 2017-07-24 DIAGNOSIS — H5213 Myopia, bilateral: Secondary | ICD-10-CM | POA: Diagnosis not present

## 2017-07-24 DIAGNOSIS — H33322 Round hole, left eye: Secondary | ICD-10-CM | POA: Diagnosis not present

## 2017-07-24 DIAGNOSIS — H33312 Horseshoe tear of retina without detachment, left eye: Secondary | ICD-10-CM | POA: Diagnosis not present

## 2017-07-25 DIAGNOSIS — E119 Type 2 diabetes mellitus without complications: Secondary | ICD-10-CM | POA: Diagnosis not present

## 2017-07-25 DIAGNOSIS — I10 Essential (primary) hypertension: Secondary | ICD-10-CM | POA: Diagnosis not present

## 2017-07-25 DIAGNOSIS — F319 Bipolar disorder, unspecified: Secondary | ICD-10-CM | POA: Diagnosis not present

## 2017-07-25 DIAGNOSIS — Z6837 Body mass index (BMI) 37.0-37.9, adult: Secondary | ICD-10-CM | POA: Diagnosis not present

## 2017-07-25 DIAGNOSIS — G43001 Migraine without aura, not intractable, with status migrainosus: Secondary | ICD-10-CM | POA: Diagnosis not present

## 2017-07-25 DIAGNOSIS — E782 Mixed hyperlipidemia: Secondary | ICD-10-CM | POA: Diagnosis not present

## 2017-07-25 DIAGNOSIS — R945 Abnormal results of liver function studies: Secondary | ICD-10-CM | POA: Diagnosis not present

## 2017-07-29 ENCOUNTER — Other Ambulatory Visit: Payer: Self-pay

## 2017-07-29 DIAGNOSIS — R945 Abnormal results of liver function studies: Principal | ICD-10-CM

## 2017-07-29 DIAGNOSIS — R7989 Other specified abnormal findings of blood chemistry: Secondary | ICD-10-CM

## 2017-08-20 DIAGNOSIS — H33312 Horseshoe tear of retina without detachment, left eye: Secondary | ICD-10-CM | POA: Diagnosis not present

## 2017-08-20 DIAGNOSIS — H25019 Cortical age-related cataract, unspecified eye: Secondary | ICD-10-CM | POA: Diagnosis not present

## 2017-08-24 DIAGNOSIS — I10 Essential (primary) hypertension: Secondary | ICD-10-CM | POA: Diagnosis not present

## 2017-08-24 DIAGNOSIS — Z79899 Other long term (current) drug therapy: Secondary | ICD-10-CM | POA: Diagnosis not present

## 2017-08-24 DIAGNOSIS — E119 Type 2 diabetes mellitus without complications: Secondary | ICD-10-CM | POA: Diagnosis not present

## 2017-08-24 DIAGNOSIS — R112 Nausea with vomiting, unspecified: Secondary | ICD-10-CM | POA: Diagnosis not present

## 2017-08-24 DIAGNOSIS — F329 Major depressive disorder, single episode, unspecified: Secondary | ICD-10-CM | POA: Diagnosis not present

## 2017-08-24 DIAGNOSIS — A09 Infectious gastroenteritis and colitis, unspecified: Secondary | ICD-10-CM | POA: Diagnosis not present

## 2017-09-11 DIAGNOSIS — Z23 Encounter for immunization: Secondary | ICD-10-CM | POA: Diagnosis not present

## 2017-09-23 DIAGNOSIS — G43909 Migraine, unspecified, not intractable, without status migrainosus: Secondary | ICD-10-CM | POA: Diagnosis not present

## 2017-09-23 DIAGNOSIS — E119 Type 2 diabetes mellitus without complications: Secondary | ICD-10-CM | POA: Diagnosis not present

## 2017-09-23 DIAGNOSIS — B373 Candidiasis of vulva and vagina: Secondary | ICD-10-CM | POA: Diagnosis not present

## 2017-09-23 DIAGNOSIS — I10 Essential (primary) hypertension: Secondary | ICD-10-CM | POA: Diagnosis not present

## 2017-09-23 DIAGNOSIS — Z113 Encounter for screening for infections with a predominantly sexual mode of transmission: Secondary | ICD-10-CM | POA: Diagnosis not present

## 2017-09-23 DIAGNOSIS — Z7984 Long term (current) use of oral hypoglycemic drugs: Secondary | ICD-10-CM | POA: Diagnosis not present

## 2017-09-24 DIAGNOSIS — I10 Essential (primary) hypertension: Secondary | ICD-10-CM | POA: Diagnosis not present

## 2017-09-24 DIAGNOSIS — Z7984 Long term (current) use of oral hypoglycemic drugs: Secondary | ICD-10-CM | POA: Diagnosis not present

## 2017-09-24 DIAGNOSIS — E785 Hyperlipidemia, unspecified: Secondary | ICD-10-CM | POA: Diagnosis not present

## 2017-09-24 DIAGNOSIS — B373 Candidiasis of vulva and vagina: Secondary | ICD-10-CM | POA: Diagnosis not present

## 2017-09-24 DIAGNOSIS — E119 Type 2 diabetes mellitus without complications: Secondary | ICD-10-CM | POA: Diagnosis not present

## 2017-10-12 ENCOUNTER — Other Ambulatory Visit: Payer: Self-pay | Admitting: Obstetrics & Gynecology

## 2017-10-13 DIAGNOSIS — F3181 Bipolar II disorder: Secondary | ICD-10-CM | POA: Diagnosis not present

## 2017-10-13 DIAGNOSIS — F419 Anxiety disorder, unspecified: Secondary | ICD-10-CM | POA: Diagnosis not present

## 2017-11-28 DIAGNOSIS — F3181 Bipolar II disorder: Secondary | ICD-10-CM | POA: Diagnosis not present

## 2017-11-28 DIAGNOSIS — F419 Anxiety disorder, unspecified: Secondary | ICD-10-CM | POA: Diagnosis not present

## 2017-12-24 DIAGNOSIS — J019 Acute sinusitis, unspecified: Secondary | ICD-10-CM | POA: Diagnosis not present

## 2017-12-25 DIAGNOSIS — F3181 Bipolar II disorder: Secondary | ICD-10-CM | POA: Diagnosis not present

## 2018-01-28 DIAGNOSIS — E119 Type 2 diabetes mellitus without complications: Secondary | ICD-10-CM | POA: Diagnosis not present

## 2018-01-28 DIAGNOSIS — Z124 Encounter for screening for malignant neoplasm of cervix: Secondary | ICD-10-CM | POA: Diagnosis not present

## 2018-01-28 DIAGNOSIS — I10 Essential (primary) hypertension: Secondary | ICD-10-CM | POA: Diagnosis not present

## 2018-01-28 DIAGNOSIS — Z7984 Long term (current) use of oral hypoglycemic drugs: Secondary | ICD-10-CM | POA: Diagnosis not present

## 2018-01-28 DIAGNOSIS — Z0000 Encounter for general adult medical examination without abnormal findings: Secondary | ICD-10-CM | POA: Diagnosis not present

## 2018-02-25 DIAGNOSIS — F3181 Bipolar II disorder: Secondary | ICD-10-CM | POA: Diagnosis not present

## 2018-02-25 DIAGNOSIS — F419 Anxiety disorder, unspecified: Secondary | ICD-10-CM | POA: Diagnosis not present

## 2018-04-21 DIAGNOSIS — F3181 Bipolar II disorder: Secondary | ICD-10-CM | POA: Diagnosis not present

## 2018-04-23 DIAGNOSIS — G43909 Migraine, unspecified, not intractable, without status migrainosus: Secondary | ICD-10-CM | POA: Diagnosis not present

## 2018-04-23 DIAGNOSIS — F419 Anxiety disorder, unspecified: Secondary | ICD-10-CM | POA: Diagnosis not present

## 2018-04-23 DIAGNOSIS — I10 Essential (primary) hypertension: Secondary | ICD-10-CM | POA: Diagnosis not present

## 2018-04-23 DIAGNOSIS — E119 Type 2 diabetes mellitus without complications: Secondary | ICD-10-CM | POA: Diagnosis not present

## 2018-05-01 DIAGNOSIS — E119 Type 2 diabetes mellitus without complications: Secondary | ICD-10-CM | POA: Diagnosis not present

## 2018-05-01 DIAGNOSIS — I10 Essential (primary) hypertension: Secondary | ICD-10-CM | POA: Diagnosis not present

## 2018-05-01 DIAGNOSIS — Z7984 Long term (current) use of oral hypoglycemic drugs: Secondary | ICD-10-CM | POA: Diagnosis not present

## 2018-05-05 DIAGNOSIS — M2041 Other hammer toe(s) (acquired), right foot: Secondary | ICD-10-CM | POA: Diagnosis not present

## 2018-05-05 DIAGNOSIS — M2042 Other hammer toe(s) (acquired), left foot: Secondary | ICD-10-CM | POA: Diagnosis not present

## 2018-05-05 DIAGNOSIS — M205X1 Other deformities of toe(s) (acquired), right foot: Secondary | ICD-10-CM | POA: Diagnosis not present

## 2018-05-05 DIAGNOSIS — M205X2 Other deformities of toe(s) (acquired), left foot: Secondary | ICD-10-CM | POA: Diagnosis not present

## 2018-05-05 DIAGNOSIS — M25774 Osteophyte, right foot: Secondary | ICD-10-CM | POA: Diagnosis not present

## 2018-05-05 DIAGNOSIS — L851 Acquired keratosis [keratoderma] palmaris et plantaris: Secondary | ICD-10-CM | POA: Diagnosis not present

## 2018-05-05 DIAGNOSIS — M79674 Pain in right toe(s): Secondary | ICD-10-CM | POA: Diagnosis not present

## 2018-05-05 DIAGNOSIS — E119 Type 2 diabetes mellitus without complications: Secondary | ICD-10-CM | POA: Diagnosis not present

## 2018-05-05 DIAGNOSIS — M79675 Pain in left toe(s): Secondary | ICD-10-CM | POA: Diagnosis not present
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
## 2018-05-15 DIAGNOSIS — Z1231 Encounter for screening mammogram for malignant neoplasm of breast: Secondary | ICD-10-CM | POA: Diagnosis not present

## 2018-06-02 DIAGNOSIS — M2042 Other hammer toe(s) (acquired), left foot: Secondary | ICD-10-CM | POA: Diagnosis not present

## 2018-06-02 DIAGNOSIS — D492 Neoplasm of unspecified behavior of bone, soft tissue, and skin: Secondary | ICD-10-CM | POA: Diagnosis not present

## 2018-06-02 DIAGNOSIS — M79675 Pain in left toe(s): Secondary | ICD-10-CM | POA: Diagnosis not present

## 2018-06-02 DIAGNOSIS — M205X1 Other deformities of toe(s) (acquired), right foot: Secondary | ICD-10-CM | POA: Diagnosis not present

## 2018-06-02 DIAGNOSIS — M79674 Pain in right toe(s): Secondary | ICD-10-CM | POA: Diagnosis not present

## 2018-06-02 DIAGNOSIS — M2041 Other hammer toe(s) (acquired), right foot: Secondary | ICD-10-CM | POA: Diagnosis not present

## 2018-06-02 DIAGNOSIS — L851 Acquired keratosis [keratoderma] palmaris et plantaris: Secondary | ICD-10-CM | POA: Diagnosis not present

## 2018-06-02 DIAGNOSIS — E119 Type 2 diabetes mellitus without complications: Secondary | ICD-10-CM | POA: Diagnosis not present

## 2018-06-02 DIAGNOSIS — M7751 Other enthesopathy of right foot: Secondary | ICD-10-CM | POA: Diagnosis not present

## 2018-06-02 DIAGNOSIS — M205X2 Other deformities of toe(s) (acquired), left foot: Secondary | ICD-10-CM | POA: Diagnosis not present

## 2018-06-02 DIAGNOSIS — R2241 Localized swelling, mass and lump, right lower limb: Secondary | ICD-10-CM | POA: Diagnosis not present

## 2018-06-26 DIAGNOSIS — F411 Generalized anxiety disorder: Secondary | ICD-10-CM | POA: Diagnosis not present

## 2018-06-26 DIAGNOSIS — Z7984 Long term (current) use of oral hypoglycemic drugs: Secondary | ICD-10-CM | POA: Diagnosis not present

## 2018-06-26 DIAGNOSIS — Z79899 Other long term (current) drug therapy: Secondary | ICD-10-CM | POA: Diagnosis not present

## 2018-06-26 DIAGNOSIS — F418 Other specified anxiety disorders: Secondary | ICD-10-CM | POA: Diagnosis not present

## 2018-06-26 DIAGNOSIS — F419 Anxiety disorder, unspecified: Secondary | ICD-10-CM | POA: Diagnosis not present

## 2018-06-26 DIAGNOSIS — I10 Essential (primary) hypertension: Secondary | ICD-10-CM | POA: Diagnosis not present

## 2018-06-26 DIAGNOSIS — E119 Type 2 diabetes mellitus without complications: Secondary | ICD-10-CM | POA: Diagnosis not present

## 2018-07-23 DIAGNOSIS — M2041 Other hammer toe(s) (acquired), right foot: Secondary | ICD-10-CM | POA: Diagnosis not present

## 2018-07-23 DIAGNOSIS — Z9889 Other specified postprocedural states: Secondary | ICD-10-CM | POA: Diagnosis not present

## 2018-07-28 DIAGNOSIS — S39012A Strain of muscle, fascia and tendon of lower back, initial encounter: Secondary | ICD-10-CM | POA: Diagnosis not present

## 2018-07-28 DIAGNOSIS — B373 Candidiasis of vulva and vagina: Secondary | ICD-10-CM | POA: Diagnosis not present

## 2018-08-24 DIAGNOSIS — E119 Type 2 diabetes mellitus without complications: Secondary | ICD-10-CM | POA: Diagnosis not present

## 2018-09-11 DIAGNOSIS — Z23 Encounter for immunization: Secondary | ICD-10-CM | POA: Diagnosis not present

## 2018-09-15 DIAGNOSIS — F3181 Bipolar II disorder: Secondary | ICD-10-CM | POA: Diagnosis not present

## 2018-09-15 DIAGNOSIS — Z5181 Encounter for therapeutic drug level monitoring: Secondary | ICD-10-CM | POA: Diagnosis not present

## 2018-09-23 DIAGNOSIS — J019 Acute sinusitis, unspecified: Secondary | ICD-10-CM | POA: Diagnosis not present

## 2018-09-25 DIAGNOSIS — I10 Essential (primary) hypertension: Secondary | ICD-10-CM | POA: Diagnosis not present

## 2018-09-25 DIAGNOSIS — Z79899 Other long term (current) drug therapy: Secondary | ICD-10-CM | POA: Diagnosis not present

## 2018-09-25 DIAGNOSIS — R112 Nausea with vomiting, unspecified: Secondary | ICD-10-CM | POA: Diagnosis not present

## 2018-09-25 DIAGNOSIS — E119 Type 2 diabetes mellitus without complications: Secondary | ICD-10-CM | POA: Diagnosis not present

## 2018-09-25 DIAGNOSIS — R197 Diarrhea, unspecified: Secondary | ICD-10-CM | POA: Diagnosis not present

## 2018-09-25 DIAGNOSIS — F418 Other specified anxiety disorders: Secondary | ICD-10-CM | POA: Diagnosis not present

## 2018-09-25 DIAGNOSIS — J019 Acute sinusitis, unspecified: Secondary | ICD-10-CM | POA: Diagnosis not present

## 2018-09-27 DIAGNOSIS — G43809 Other migraine, not intractable, without status migrainosus: Secondary | ICD-10-CM | POA: Diagnosis not present

## 2018-09-27 DIAGNOSIS — I10 Essential (primary) hypertension: Secondary | ICD-10-CM | POA: Diagnosis not present

## 2018-09-27 DIAGNOSIS — E119 Type 2 diabetes mellitus without complications: Secondary | ICD-10-CM | POA: Diagnosis not present

## 2018-09-27 DIAGNOSIS — F17210 Nicotine dependence, cigarettes, uncomplicated: Secondary | ICD-10-CM | POA: Diagnosis not present

## 2018-09-27 DIAGNOSIS — F418 Other specified anxiety disorders: Secondary | ICD-10-CM | POA: Diagnosis not present

## 2018-09-27 DIAGNOSIS — J0191 Acute recurrent sinusitis, unspecified: Secondary | ICD-10-CM | POA: Diagnosis not present

## 2018-10-02 ENCOUNTER — Other Ambulatory Visit: Payer: Self-pay | Admitting: Obstetrics & Gynecology

## 2018-11-03 DIAGNOSIS — F4320 Adjustment disorder, unspecified: Secondary | ICD-10-CM | POA: Diagnosis not present

## 2018-11-11 DIAGNOSIS — M5441 Lumbago with sciatica, right side: Secondary | ICD-10-CM | POA: Diagnosis not present

## 2018-11-23 DIAGNOSIS — F4320 Adjustment disorder, unspecified: Secondary | ICD-10-CM | POA: Diagnosis not present

## 2018-12-09 DIAGNOSIS — F419 Anxiety disorder, unspecified: Secondary | ICD-10-CM | POA: Diagnosis not present

## 2018-12-09 DIAGNOSIS — E119 Type 2 diabetes mellitus without complications: Secondary | ICD-10-CM | POA: Diagnosis not present

## 2018-12-09 DIAGNOSIS — M549 Dorsalgia, unspecified: Secondary | ICD-10-CM | POA: Diagnosis not present

## 2018-12-09 DIAGNOSIS — I10 Essential (primary) hypertension: Secondary | ICD-10-CM | POA: Diagnosis not present

## 2018-12-09 DIAGNOSIS — Z7984 Long term (current) use of oral hypoglycemic drugs: Secondary | ICD-10-CM | POA: Diagnosis not present

## 2018-12-09 DIAGNOSIS — F4320 Adjustment disorder, unspecified: Secondary | ICD-10-CM | POA: Diagnosis not present

## 2018-12-09 DIAGNOSIS — G43909 Migraine, unspecified, not intractable, without status migrainosus: Secondary | ICD-10-CM | POA: Diagnosis not present

## 2018-12-09 DIAGNOSIS — F329 Major depressive disorder, single episode, unspecified: Secondary | ICD-10-CM | POA: Diagnosis not present

## 2018-12-15 DIAGNOSIS — F3170 Bipolar disorder, currently in remission, most recent episode unspecified: Secondary | ICD-10-CM | POA: Diagnosis not present

## 2019-01-06 DIAGNOSIS — F4320 Adjustment disorder, unspecified: Secondary | ICD-10-CM | POA: Diagnosis not present

## 2019-01-20 DIAGNOSIS — F4320 Adjustment disorder, unspecified: Secondary | ICD-10-CM | POA: Diagnosis not present

## 2019-02-04 DIAGNOSIS — F4320 Adjustment disorder, unspecified: Secondary | ICD-10-CM | POA: Diagnosis not present

## 2019-02-09 DIAGNOSIS — F4320 Adjustment disorder, unspecified: Secondary | ICD-10-CM | POA: Diagnosis not present

## 2019-02-11 DIAGNOSIS — F4320 Adjustment disorder, unspecified: Secondary | ICD-10-CM | POA: Diagnosis not present

## 2019-02-17 DIAGNOSIS — F4320 Adjustment disorder, unspecified: Secondary | ICD-10-CM | POA: Diagnosis not present

## 2019-02-23 DIAGNOSIS — M5441 Lumbago with sciatica, right side: Secondary | ICD-10-CM | POA: Diagnosis not present

## 2019-02-26 DIAGNOSIS — F4320 Adjustment disorder, unspecified: Secondary | ICD-10-CM | POA: Diagnosis not present

## 2019-03-03 DIAGNOSIS — F4320 Adjustment disorder, unspecified: Secondary | ICD-10-CM | POA: Diagnosis not present

## 2019-03-10 DIAGNOSIS — F4320 Adjustment disorder, unspecified: Secondary | ICD-10-CM | POA: Diagnosis not present

## 2019-03-16 DIAGNOSIS — F4320 Adjustment disorder, unspecified: Secondary | ICD-10-CM | POA: Diagnosis not present

## 2019-03-23 DIAGNOSIS — F4320 Adjustment disorder, unspecified: Secondary | ICD-10-CM | POA: Diagnosis not present

## 2019-03-31 DIAGNOSIS — F4320 Adjustment disorder, unspecified: Secondary | ICD-10-CM | POA: Diagnosis not present

## 2019-04-01 DIAGNOSIS — F4320 Adjustment disorder, unspecified: Secondary | ICD-10-CM | POA: Diagnosis not present

## 2019-04-07 DIAGNOSIS — F4320 Adjustment disorder, unspecified: Secondary | ICD-10-CM | POA: Diagnosis not present

## 2019-04-13 DIAGNOSIS — F4320 Adjustment disorder, unspecified: Secondary | ICD-10-CM | POA: Diagnosis not present

## 2019-09-09 ENCOUNTER — Encounter

## 2019-09-09 LAB — AMB EXT HGBA1C
Hemoglobin A1C, External: 6.9 %
Hemoglobin A1c, External: 6.9 %

## 2019-09-09 MED ORDER — CLONAZEPAM 0.5 MG TAB
0.5 mg | ORAL_TABLET | ORAL | 0 refills | Status: DC
Start: 2019-09-09 — End: 2019-10-17

## 2019-09-09 NOTE — Telephone Encounter (Signed)
Klonopin refill request.

## 2019-10-01 ENCOUNTER — Other Ambulatory Visit: Payer: Self-pay | Admitting: Obstetrics & Gynecology

## 2019-10-17 ENCOUNTER — Encounter

## 2019-10-17 NOTE — Telephone Encounter (Signed)
Klonopin refill request.

## 2019-10-18 MED ORDER — CLONAZEPAM 0.5 MG TAB
0.5 mg | ORAL_TABLET | Freq: Two times a day (BID) | ORAL | 0 refills | Status: DC | PRN
Start: 2019-10-18 — End: 2019-11-15

## 2019-11-13 ENCOUNTER — Encounter

## 2019-11-15 MED ORDER — CLONAZEPAM 0.5 MG TAB
0.5 mg | ORAL_TABLET | ORAL | 0 refills | Status: DC
Start: 2019-11-15 — End: 2019-12-16

## 2019-11-15 NOTE — Telephone Encounter (Signed)
Klonopin refill request.

## 2019-12-08 MED ORDER — ARIPIPRAZOLE 2 MG TAB
2 mg | ORAL_TABLET | ORAL | 0 refills | Status: DC
Start: 2019-12-08 — End: 2020-03-05

## 2019-12-16 ENCOUNTER — Encounter

## 2019-12-16 MED ORDER — CLONAZEPAM 0.5 MG TAB
0.5 mg | ORAL_TABLET | ORAL | 0 refills | Status: DC
Start: 2019-12-16 — End: 2020-01-11

## 2019-12-16 NOTE — Telephone Encounter (Signed)
Klonopin refill request.

## 2020-01-11 ENCOUNTER — Encounter

## 2020-01-11 NOTE — Telephone Encounter (Signed)
Klonopin refill request.

## 2020-01-12 MED ORDER — CLONAZEPAM 0.5 MG TAB
0.5 mg | ORAL_TABLET | ORAL | 0 refills | Status: DC
Start: 2020-01-12 — End: 2020-02-22

## 2020-02-22 ENCOUNTER — Encounter

## 2020-02-22 MED ORDER — CLONAZEPAM 0.5 MG TAB
0.5 mg | ORAL_TABLET | ORAL | 0 refills | Status: DC
Start: 2020-02-22 — End: 2020-03-21

## 2020-02-22 NOTE — Telephone Encounter (Signed)
Klonopin refill request.

## 2020-02-29 LAB — AMB EXT HGBA1C
Hemoglobin A1C, External: 6.1 %
Hemoglobin A1c, External: 6.1 %

## 2020-03-02 ENCOUNTER — Encounter

## 2020-03-02 MED ORDER — VIIBRYD 40 MG TABLET
40 mg | ORAL_TABLET | ORAL | 0 refills | Status: DC
Start: 2020-03-02 — End: 2020-05-22

## 2020-03-02 NOTE — Telephone Encounter (Signed)
Patient reports that she was prescribed doxepin for sleep. She plans to check with the pharmacist today regarding drug-drug interactions to be sure it is safe to take with other medications. She is encouraged to follow up with pharmacist as noted.

## 2020-03-05 MED ORDER — ARIPIPRAZOLE 2 MG TAB
2 mg | ORAL_TABLET | ORAL | 0 refills | Status: DC
Start: 2020-03-05 — End: 2020-05-22

## 2020-03-20 ENCOUNTER — Encounter

## 2020-03-21 ENCOUNTER — Inpatient Hospital Stay: Admit: 2020-03-21 | Payer: MEDICARE | Primary: Family

## 2020-03-21 DIAGNOSIS — Z1231 Encounter for screening mammogram for malignant neoplasm of breast: Secondary | ICD-10-CM

## 2020-03-21 MED ORDER — CLONAZEPAM 0.5 MG TAB
0.5 mg | ORAL_TABLET | ORAL | 0 refills | Status: DC
Start: 2020-03-21 — End: 2020-04-21

## 2020-03-21 NOTE — Telephone Encounter (Signed)
Klonopin refill request.

## 2020-03-27 ENCOUNTER — Ambulatory Visit: Admit: 2020-03-27 | Discharge: 2020-03-27 | Payer: MEDICARE | Attending: Registered Nurse | Primary: Family

## 2020-03-27 ENCOUNTER — Ambulatory Visit: Attending: Registered Nurse | Primary: Family

## 2020-03-27 DIAGNOSIS — F411 Generalized anxiety disorder: Secondary | ICD-10-CM

## 2020-03-27 NOTE — Progress Notes (Signed)
Mallory Bonilla is a 54 y.o. female who presents today for the following:  Chief Complaint   Patient presents with   ??? Follow-up     "I was doing pretty well until January."   ??? Bipolar   ??? Anxiety       No Known Allergies    Current Outpatient Medications   Medication Sig   ??? clonazePAM (KlonoPIN) 0.5 mg tablet TAKE 1 TABLET BY MOUTH TWICE DAILY AS NEEDED FOR ANXIETY **MAX  DAILY  AMOUNT  2  TABLETS**   ??? ARIPiprazole (ABILIFY) 2 mg tablet TAKE 1 TABLET BY MOUTH AT BEDTIME   ??? Viibryd 40 mg tab tablet Take 1 tablet by mouth once daily     No current facility-administered medications for this visit.        Past Medical History:   Diagnosis Date   ??? Arthritis    ??? Diabetes (HCC)    ??? Headache    ??? Hypercholesterolemia    ??? Hypertension        Past Surgical History:   Procedure Laterality Date   ??? HX OOPHORECTOMY         Family History   Problem Relation Age of Onset   ??? Diabetes Mother    ??? Cancer Mother    ??? Diabetes Sister    ??? Heart Disease Sister    ??? Cancer Sister    ??? Diabetes Brother    ??? Heart Disease Brother        Social History     Socioeconomic History   ??? Marital status: DIVORCED     Spouse name: Not on file   ??? Number of children: Not on file   ??? Years of education: Not on file   ??? Highest education level: Not on file   Occupational History   ??? Not on file   Social Needs   ??? Financial resource strain: Not on file   ??? Food insecurity     Worry: Not on file     Inability: Not on file   ??? Transportation needs     Medical: Not on file     Non-medical: Not on file   Tobacco Use   ??? Smoking status: Never Smoker   ??? Smokeless tobacco: Never Used   Substance and Sexual Activity   ??? Alcohol use: Not Currently   ??? Drug use: Not Currently   ??? Sexual activity: Yes   Lifestyle   ??? Physical activity     Days per week: Not on file     Minutes per session: Not on file   ??? Stress: Not on file   Relationships   ??? Social connections     Talks on phone: Not on file     Gets together: Not on file     Attends religious service:  Not on file     Active member of club or organization: Not on file     Attends meetings of clubs or organizations: Not on file     Relationship status: Not on file   ??? Intimate partner violence     Fear of current or ex partner: Not on file     Emotionally abused: Not on file     Physically abused: Not on file     Forced sexual activity: Not on file   Other Topics Concern   ??? Not on file   Social History Narrative   ??? Not on file           [name] follows up in clinic for bipolar disorder current episode depressed and anxiety disorder.  She is prescribed Abilify 2 mg tablet take 1 tablet daily, Viibryd 40 mg tablet take 1 tablet daily and clonazepam 0.5 mg tablet take 1 tablet twice daily BuSpar 10 mg tablet take 1 tablet daily.  Patient last visited the clinic December 15, 2018.    Patient reports that she was doing well until January 2021.  She reports that she lost her mother January 18, her best friend and cousin January 22 and her children's father January 27.  She reports feeling mild to moderately depressed and anxious which she relates to grief.  She reports fleeting suicidal thinking but denies any plan.  Patient reports that she will not act on her feelings because of her children.  Risk for suicide is low-moderate due to history of multiple attempts in the past which was several years ago.  She still avoids driving on interstate due to high anxiety so her significant other Alexia Freestone does most of the driving.  She reports fluctuating appetite.  Patient reports waking up during the night which she relates to being "a parent."  Patient reports that she colors to help get back to sleep.  No psychotic symptoms observed or reported.  She is pleasant on interaction.  Her mood appears mildly depressed.  Patient reports that she would like to continue medications as prescribed and does not feel the need for adjustment at this time, however if symptoms worsen she will consider medication adjustment.  She mentions  that she is seen Mrs. Gatten for counseling.  Patient denies smoking, alcohol and drug use.        Review of Systems   Psychiatric/Behavioral: Positive for depression. The patient has insomnia.    All other systems reviewed and are negative.        Visit Vitals  Wt 90.9 kg (200 lb 6.4 oz)     Physical Exam  Psychiatric:         Attention and Perception: Attention and perception normal.         Mood and Affect: Mood is depressed.         Speech: Speech normal.         Behavior: Behavior normal. Behavior is cooperative.         Thought Content: Thought content normal.         Cognition and Memory: Cognition and memory normal.         Judgment: Judgment normal.            Plan:    Continue medications at this time.  Continue counseling.  Advised patient to avoid alcohol and drug use.  Advised patient to call 911 or go to the emergency department for suicidal/homicidal thinking.  Follow-up with medical provider as appropriate.  Patient may call the clinic for any issues.

## 2020-03-27 NOTE — Progress Notes (Signed)
Mallory Bonilla is a 54 y.o. female who presents today for the following:  Chief Complaint   Patient presents with   ??? Follow-up     "I was doing pretty well until January."   ??? Bipolar   ??? Anxiety       No Known Allergies    Current Outpatient Medications   Medication Sig   ??? clonazePAM (KlonoPIN) 0.5 mg tablet TAKE 1 TABLET BY MOUTH TWICE DAILY AS NEEDED FOR ANXIETY **MAX  DAILY  AMOUNT  2  TABLETS**   ??? ARIPiprazole (ABILIFY) 2 mg tablet TAKE 1 TABLET BY MOUTH AT BEDTIME   ??? Viibryd 40 mg tab tablet Take 1 tablet by mouth once daily     No current facility-administered medications for this visit.        Past Medical History:   Diagnosis Date   ??? Arthritis    ??? Diabetes ([place])    ??? Headache    ??? Hypercholesterolemia    ??? Hypertension        Past Surgical History:   Procedure Laterality Date   ??? HX OOPHORECTOMY         Family History   Problem Relation Age of Onset   ??? Diabetes Mother    ??? Cancer Mother    ??? Diabetes Sister    ??? Heart Disease Sister    ??? Cancer Sister    ??? Diabetes Brother    ??? Heart Disease Brother        Social History     Socioeconomic History   ??? Marital status: DIVORCED     Spouse name: Not on file   ??? Number of children: Not on file   ??? Years of education: Not on file   ??? Highest education level: Not on file   Occupational History   ??? Not on file   Social Needs   ??? Financial resource strain: Not on file   ??? Food insecurity     Worry: Not on file     Inability: Not on file   ??? Transportation needs     Medical: Not on file     Non-medical: Not on file   Tobacco Use   ??? Smoking status: Never Smoker   ??? Smokeless tobacco: Never Used   Substance and Sexual Activity   ??? Alcohol use: Not Currently   ??? Drug use: Not Currently   ??? Sexual activity: Yes   Lifestyle   ??? Physical activity     Days per week: Not on file     Minutes per session: Not on file   ??? Stress: Not on file   Relationships   ??? Social Product manager on phone: Not on file     Gets together: Not on file     Attends religious service:  Not on file     Active member of club or organization: Not on file     Attends meetings of clubs or organizations: Not on file     Relationship status: Not on file   ??? Intimate partner violence     Fear of current or ex partner: Not on file     Emotionally abused: Not on file     Physically abused: Not on file     Forced sexual activity: Not on file   Other Topics Concern   ??? Not on file   Social History Narrative   ??? Not on file  Mallory Bonilla follows up in clinic for bipolar disorder current episode depressed and anxiety disorder.  She is prescribed Abilify 2 mg tablet take 1 tablet daily, Viibryd 40 mg tablet take 1 tablet daily and clonazepam 0.5 mg tablet take 1 tablet twice daily BuSpar 10 mg tablet take 1 tablet daily.  Patient last visited the clinic December 15, 2018.    Patient reports that she was doing well until January 2021.  She reports that she lost her mother January 18, her best friend and cousin January 22 and her children's father January 27.  She reports feeling mild to moderately depressed and anxious which she relates to grief.  She reports fleeting suicidal thinking but denies any plan.  Patient reports that she will not act on her feelings because of her children.  Risk for suicide is low-moderate due to history of multiple attempts in the past which was several years ago.  She still avoids driving on interstate due to high anxiety so her significant other Mallory Bonilla does most of the driving.  She reports fluctuating appetite.  Patient reports waking up during the night which she relates to being "a parent."  Patient reports that she colors to help get back to sleep.  No psychotic symptoms observed or reported.  She is pleasant on interaction.  Her mood appears mildly depressed.  Patient reports that she would like to continue medications as prescribed and does not feel the need for adjustment at this time, however if symptoms worsen she will consider medication adjustment.  She mentions  that she is seen Mrs. Gatten for counseling.  Patient denies smoking, alcohol and drug use.        Review of Systems   Psychiatric/Behavioral: Positive for depression. The patient has insomnia.    All other systems reviewed and are negative.        Visit Vitals  Wt 90.9 kg (200 lb 6.4 oz)     Physical Exam  Psychiatric:         Attention and Perception: Attention and perception normal.         Mood and Affect: Mood is depressed.         Speech: Speech normal.         Behavior: Behavior normal. Behavior is cooperative.         Thought Content: Thought content normal.         Cognition and Memory: Cognition and memory normal.         Judgment: Judgment normal.            Plan:    Continue medications at this time.  Continue counseling.  Advised patient to avoid alcohol and drug use.  Advised patient to call 911 or go to the emergency department for suicidal/homicidal thinking.  Follow-up with medical provider as appropriate.  Patient may call the clinic for any issues.

## 2020-04-20 ENCOUNTER — Encounter

## 2020-04-21 MED ORDER — CLONAZEPAM 0.5 MG TAB
0.5 mg | ORAL_TABLET | ORAL | 0 refills | Status: DC
Start: 2020-04-21 — End: 2020-05-22

## 2020-04-21 NOTE — Telephone Encounter (Signed)
Klonopin refill request.

## 2020-05-21 ENCOUNTER — Encounter

## 2020-05-22 MED ORDER — VIIBRYD 40 MG TABLET
40 mg | ORAL_TABLET | ORAL | 0 refills | Status: DC
Start: 2020-05-22 — End: 2020-09-05

## 2020-05-22 MED ORDER — CLONAZEPAM 0.5 MG TAB
0.5 mg | ORAL_TABLET | ORAL | 0 refills | Status: DC
Start: 2020-05-22 — End: 2020-06-22

## 2020-05-22 MED ORDER — ARIPIPRAZOLE 2 MG TAB
2 mg | ORAL_TABLET | ORAL | 0 refills | Status: DC
Start: 2020-05-22 — End: 2020-06-28

## 2020-06-22 ENCOUNTER — Encounter

## 2020-06-22 MED ORDER — CLONAZEPAM 0.5 MG TAB
0.5 mg | ORAL_TABLET | ORAL | 0 refills | Status: DC
Start: 2020-06-22 — End: 2020-07-19

## 2020-06-22 NOTE — Telephone Encounter (Signed)
Klonopin refill request.

## 2020-06-27 ENCOUNTER — Encounter

## 2020-06-28 MED ORDER — ARIPIPRAZOLE 2 MG TAB
2 mg | ORAL_TABLET | ORAL | 0 refills | Status: DC
Start: 2020-06-28 — End: 2020-12-04

## 2020-07-03 DIAGNOSIS — F329 Major depressive disorder, single episode, unspecified: Secondary | ICD-10-CM

## 2020-07-03 NOTE — ED Notes (Signed)
Pt states that she has had a lot of deaths happen this year and its the anniversary of her sisters death and it;s bring back a lot of memories of all those she has lost. She has been crying a lot. Last night she took ten xanax. She also states that the situation at home is not well either. She states that her and her partner are constantly arguing and not getting along. Denies physical abuse.

## 2020-07-03 NOTE — ED Provider Notes (Signed)
Patient presents with complaint of being depressed and suicidal . She took 10 Xanax pills yesterday . She plans on taking an overdose. This has been progressing over past 2 weeks. She is having problems with her partner.            Past Medical History:   Diagnosis Date   ??? Arthritis    ??? Diabetes ([facility])    ??? Headache    ??? Hypercholesterolemia    ??? Hypertension        Past Surgical History:   Procedure Laterality Date   ??? HX OOPHORECTOMY           Family History:   Problem Relation Age of Onset   ??? Diabetes Mother    ??? Cancer Mother    ??? Diabetes Sister    ??? Heart Disease Sister    ??? Cancer Sister    ??? Diabetes Brother    ??? Heart Disease Brother        Social History     Socioeconomic History   ??? Marital status: DIVORCED     Spouse name: Not on file   ??? Number of children: Not on file   ??? Years of education: Not on file   ??? Highest education level: Not on file   Occupational History   ??? Not on file   Tobacco Use   ??? Smoking status: Never Smoker   ??? Smokeless tobacco: Never Used   Substance and Sexual Activity   ??? Alcohol use: Not Currently   ??? Drug use: Not Currently   ??? Sexual activity: Yes   Other Topics Concern   ??? Not on file   Social History Narrative   ??? Not on file     Social Determinants of Health     Financial Resource Strain:    ??? Difficulty of Paying Living Expenses:    Food Insecurity:    ??? Worried About Programme researcher, broadcasting/film/video in the Last Year:    ??? Barista in the Last Year:    Transportation Needs:    ??? Freight forwarder (Medical):    ??? Lack of Transportation (Non-Medical):    Physical Activity:    ??? Days of Exercise per Week:    ??? Minutes of Exercise per Session:    Stress:    ??? Feeling of Stress :    Social Connections:    ??? Frequency of Communication with Friends and Family:    ??? Frequency of Social Gatherings with Friends and Family:    ??? Attends Religious Services:    ??? Database administrator or Organizations:    ??? Attends Engineer, structural:    ??? Marital Status:    Intimate  Programme researcher, broadcasting/film/video Violence:    ??? Fear of Current or Ex-Partner:    ??? Emotionally Abused:    ??? Physically Abused:    ??? Sexually Abused:          ALLERGIES: Patient has no known allergies.    Review of Systems   Constitutional: Negative.    HENT: Negative.    Eyes: Negative.    Respiratory: Negative.    Cardiovascular: Negative.    Gastrointestinal: Negative.    Endocrine: Negative.    Genitourinary: Negative.    Skin: Negative.    Allergic/Immunologic: Negative.    Neurological: Negative.    Hematological: Negative.    Psychiatric/Behavioral: Positive for suicidal ideas.   All other systems reviewed and are negative.  There were no vitals filed for this visit.         Physical Exam  Vitals and nursing note reviewed.   Constitutional:       Appearance: She is well-developed.   HENT:      Head: Normocephalic and atraumatic.   Eyes:      Extraocular Movements: Extraocular movements intact.      Pupils: Pupils are equal, round, and reactive to light.   Cardiovascular:      Rate and Rhythm: Normal rate and regular rhythm.      Heart sounds: Normal heart sounds.   Pulmonary:      Breath sounds: Normal breath sounds.   Abdominal:      General: Bowel sounds are normal.      Palpations: Abdomen is soft.   Musculoskeletal:         General: Normal range of motion.      Cervical back: Normal range of motion and neck supple.   Neurological:      General: No focal deficit present.      Mental Status: She is alert.   Psychiatric:         Attention and Perception: Attention normal.         Mood and Affect: Mood is depressed.         Behavior: Behavior normal. Behavior is cooperative.         Thought Content: Thought content includes suicidal ideation. Thought content does not include homicidal ideation. Thought content includes suicidal plan. Thought content does not include homicidal plan.         Cognition and Memory: Cognition normal.          MDM  Number of Diagnoses or Management Options  Risk of Complications, Morbidity, and/or  Mortality  Presenting problems: moderate  Diagnostic procedures: moderate  Management options: moderate  General comments: 6:00. Patient feels better and is no longer suicidal. She will follow up with her counselor today.    Patient Progress  Patient progress: improved         Procedures

## 2020-07-04 ENCOUNTER — Inpatient Hospital Stay
Admit: 2020-07-04 | Discharge: 2020-07-04 | Disposition: A | Discharge status: Home / Self Care | Payer: MEDICARE | Attending: Emergency Medicine

## 2020-07-04 LAB — COMPREHENSIVE METABOLIC PANEL
ALT: 40 U/L (ref 12–78)
AST: 33 U/L (ref 15–37)
Albumin/Globulin Ratio: 0.9 — ABNORMAL LOW (ref 1.1–2.2)
Albumin: 3.2 g/dL — ABNORMAL LOW (ref 3.5–5.0)
Alkaline Phosphatase: 107 U/L (ref 45–117)
Anion Gap: 9 mmol/L (ref 5–15)
BUN: 16 mg/dL (ref 6–20)
Bun/Cre Ratio: 16 (ref 12–20)
CO2: 27 mmol/L (ref 21–32)
Calcium: 9.4 mg/dL (ref 8.5–10.1)
Chloride: 104 mmol/L (ref 97–108)
Creatinine: 1.02 mg/dL (ref 0.55–1.02)
EGFR IF NonAfrican American: 56 mL/min/{1.73_m2} — ABNORMAL LOW (ref >60)
GFR African American: >60 mL/min/{1.73_m2} (ref >60)
Globulin: 3.5 g/dL (ref 2.0–4.0)
Glucose: 199 mg/dL — ABNORMAL HIGH (ref 65–100)
Potassium: 3.7 mmol/L (ref 3.5–5.1)
Sodium: 140 mmol/L (ref 136–145)
Total Bilirubin: 0.2 mg/dL (ref 0.2–1.0)
Total Protein: 6.7 g/dL (ref 6.4–8.2)

## 2020-07-04 LAB — CBC WITH AUTO DIFFERENTIAL
Basophils %: 1 % (ref 0.0–2.5)
Basophils Absolute: 0 10*3/uL (ref 0.0–0.2)
Eosinophils %: 8 % — ABNORMAL HIGH (ref 0.9–2.9)
Eosinophils Absolute: 0.5 10*3/uL (ref 0.0–0.7)
Hematocrit: 41.5 % (ref 36–46)
Hemoglobin: 13.9 g/dL (ref 13.5–17.5)
Lymphocytes %: 32 % (ref 20.5–51.1)
Lymphocytes Absolute: 2.3 10*3/uL (ref 1.0–4.8)
MCH: 28.3 PG — ABNORMAL LOW (ref 31–34)
MCHC: 33.6 g/dL (ref 31.0–36.0)
MCV: 84.2 FL (ref 80–100)
MPV: 9.4 FL (ref 6.5–11.5)
Monocytes %: 6 % (ref 1.7–9.3)
Monocytes Absolute: 0.5 10*3/uL (ref 0.2–2.4)
NRBC Absolute: 0.01 10*3/uL
Neutrophils %: 53 % (ref 42–75)
Neutrophils Absolute: 3.8 10*3/uL (ref 1.8–7.7)
Nucleated RBCs: 0.1 PER 100 WBC
Platelets: 153 10*3/uL (ref 150–400)
RBC: 4.92 M/uL (ref 4.50–5.90)
RDW: 13.8 % (ref 11.5–14.5)
WBC: 7.2 10*3/uL (ref 4.4–11.3)

## 2020-07-04 LAB — DRUG SCREEN, URINE
AMPHETAMINES: NEGATIVE
Amphetamine Screen, Urine: NEGATIVE
BARBITURATES: NEGATIVE
BENZODIAZEPINES: POSITIVE — AB
Barbiturate Screen, Urine: NEGATIVE
Benzodiazepine Screen, Urine: POSITIVE — AB
COCAINE: NEGATIVE
Cocaine Screen Urine: NEGATIVE
ECSTASY, ECST: NEGATIVE
ECSTASY, MDMA: NEGATIVE
METHADONE: NEGATIVE
Methadone Screen, Urine: NEGATIVE
OPIATES: NEGATIVE
Opiate Screen, Urine: NEGATIVE
PCP Screen, Urine: NEGATIVE
PCP(PHENCYCLIDINE): NEGATIVE
THC (TH-CANNABINOL): NEGATIVE
THC Screen, Urine: NEGATIVE

## 2020-07-04 LAB — SALICYLATE
Salicylate level: <1.7 mg/dL — ABNORMAL LOW (ref 2.8–20.0)
Salicylate: <1.7 mg/dL — ABNORMAL LOW (ref 2.8–20.0)

## 2020-07-04 LAB — ACETAMINOPHEN LEVEL: Acetaminophen Level: <10 ug/mL — ABNORMAL LOW (ref 10–30)

## 2020-07-04 LAB — URINALYSIS W/ RFLX MICROSCOPIC
Bilirubin, Urine: NEGATIVE
Bilirubin: NEGATIVE
Blood, Urine: NEGATIVE
Blood: NEGATIVE
Glucose, Ur: >1000 mg/dL — AB
Glucose: >1000 mg/dL — AB
Ketone: NEGATIVE mg/dL
Ketones, Urine: NEGATIVE mg/dL
Leukocyte Esterase, Urine: NEGATIVE
Leukocyte Esterase: NEGATIVE
Nitrite, Urine: NEGATIVE
Nitrites: NEGATIVE
Protein, UA: NEGATIVE mg/dL
Protein: NEGATIVE mg/dL
Specific Gravity, UA: 1.02 (ref 1.003–1.030)
Specific gravity: 1.02 (ref 1.003–1.030)
Urobilinogen, UA, POCT: 0.2 EU/dL (ref 0.2–1.0)
Urobilinogen: 0.2 EU/dL (ref 0.2–1.0)
pH (UA): 6 (ref 5.0–8.0)
pH, UA: 6 (ref 5.0–8.0)

## 2020-07-04 LAB — COVID-19

## 2020-07-04 LAB — SARS-COV-2
SARS-CoV-2 by PCR: NOT DETECTED
SARS-CoV-2: NOT DETECTED

## 2020-07-04 LAB — ETHYL ALCOHOL
ALCOHOL(ETHYL),SERUM: <4 mg/dL (ref <10)
Ethyl Alcohol: <4 mg/dL (ref <10)

## 2020-07-04 LAB — CBC WITH AUTOMATED DIFF
ABS. BASOPHILS: 0 10*3/uL (ref 0.0–0.2)
ABS. EOSINOPHILS: 0.5 10*3/uL (ref 0.0–0.7)
ABS. LYMPHOCYTES: 2.3 10*3/uL (ref 1.0–4.8)
ABS. MONOCYTES: 0.5 10*3/uL (ref 0.2–2.4)
ABS. NEUTROPHILS: 3.8 10*3/uL (ref 1.8–7.7)
ABSOLUTE NRBC: 0.01 10*3/uL
BASOPHILS: 1 % (ref 0.0–2.5)
EOSINOPHILS: 8 % — ABNORMAL HIGH (ref 0.9–2.9)
HCT: 41.5 % (ref 36–46)
HGB: 13.9 g/dL (ref 13.5–17.5)
LYMPHOCYTES: 32 % (ref 20.5–51.1)
MCH: 28.3 PG — ABNORMAL LOW (ref 31–34)
MCHC: 33.6 g/dL (ref 31.0–36.0)
MCV: 84.2 FL (ref 80–100)
MONOCYTES: 6 % (ref 1.7–9.3)
MPV: 9.4 FL (ref 6.5–11.5)
NEUTROPHILS: 53 % (ref 42–75)
NRBC: 0.1 PER 100 WBC
PLATELET: 153 10*3/uL (ref 150–400)
RBC: 4.92 M/uL (ref 4.50–5.90)
RDW: 13.8 % (ref 11.5–14.5)
WBC: 7.2 10*3/uL (ref 4.4–11.3)

## 2020-07-04 LAB — METABOLIC PANEL, COMPREHENSIVE
A-G Ratio: 0.9 — ABNORMAL LOW (ref 1.1–2.2)
ALT (SGPT): 40 U/L (ref 12–78)
AST (SGOT): 33 U/L (ref 15–37)
Albumin: 3.2 g/dL — ABNORMAL LOW (ref 3.5–5.0)
Alk. phosphatase: 107 U/L (ref 45–117)
Anion gap: 9 mmol/L (ref 5–15)
BUN/Creatinine ratio: 16 (ref 12–20)
BUN: 16 mg/dL (ref 6–20)
Bilirubin, total: 0.2 mg/dL (ref 0.2–1.0)
CO2: 27 mmol/L (ref 21–32)
Calcium: 9.4 mg/dL (ref 8.5–10.1)
Chloride: 104 mmol/L (ref 97–108)
Creatinine: 1.02 mg/dL (ref 0.55–1.02)
GFR est AA: >60 mL/min/{1.73_m2} (ref >60)
GFR est non-AA: 56 mL/min/{1.73_m2} — ABNORMAL LOW (ref >60)
Globulin: 3.5 g/dL (ref 2.0–4.0)
Glucose: 199 mg/dL — ABNORMAL HIGH (ref 65–100)
Potassium: 3.7 mmol/L (ref 3.5–5.1)
Protein, total: 6.7 g/dL (ref 6.4–8.2)
Sodium: 140 mmol/L (ref 136–145)

## 2020-07-04 LAB — ACETAMINOPHEN: Acetaminophen level: <10 ug/mL — ABNORMAL LOW (ref 10–30)

## 2020-07-04 NOTE — ED Notes (Signed)
Pt agrees to follow up with pcp and regular counselor today.  Will return if SI thoughts or feelings return.

## 2020-07-04 NOTE — ED Notes (Signed)
 Pt states I need to go home.  I'm not feeling suicidal anymore. MD aware.

## 2020-07-04 NOTE — ED Notes (Signed)
Pt ambulatory to restroom.

## 2020-07-18 ENCOUNTER — Encounter

## 2020-07-19 MED ORDER — CLONAZEPAM 0.5 MG TAB
0.5 mg | ORAL_TABLET | ORAL | 0 refills | Status: DC
Start: 2020-07-19 — End: 2020-08-21

## 2020-07-19 NOTE — Telephone Encounter (Signed)
Klonopin refill request.

## 2020-08-14 LAB — COVID-19: SARS-CoV-2, NAA: NEGATIVE

## 2020-08-14 LAB — NOVEL CORONAVIRUS (COVID-19): SARS-CoV-2, NAA: NEGATIVE

## 2020-08-17 LAB — COVID-19: SARS-CoV-2, NAA: POSITIVE

## 2020-08-17 LAB — NOVEL CORONAVIRUS (COVID-19): SARS-CoV-2, NAA: POSITIVE

## 2020-08-21 ENCOUNTER — Encounter

## 2020-08-21 MED ORDER — CLONAZEPAM 0.5 MG TAB
0.5 mg | ORAL_TABLET | ORAL | 0 refills | Status: DC
Start: 2020-08-21 — End: 2020-09-20

## 2020-08-21 NOTE — Telephone Encounter (Signed)
Klonopin refill request.

## 2020-09-05 ENCOUNTER — Encounter

## 2020-09-05 MED ORDER — VIIBRYD 40 MG TABLET
40 mg | ORAL_TABLET | ORAL | 0 refills | Status: DC
Start: 2020-09-05 — End: 2020-12-04

## 2020-09-20 ENCOUNTER — Encounter

## 2020-09-20 MED ORDER — CLONAZEPAM 0.5 MG TAB
0.5 mg | ORAL_TABLET | ORAL | 0 refills | Status: DC
Start: 2020-09-20 — End: 2020-09-26

## 2020-09-20 NOTE — Telephone Encounter (Signed)
Klonopin refill request.

## 2020-09-26 ENCOUNTER — Encounter

## 2020-09-26 ENCOUNTER — Other Ambulatory Visit: Payer: Self-pay | Admitting: Obstetrics & Gynecology

## 2020-09-26 MED ORDER — CLONAZEPAM 0.5 MG TAB
0.5 mg | ORAL_TABLET | ORAL | 0 refills | Status: DC
Start: 2020-09-26 — End: 2020-11-14

## 2020-09-26 NOTE — Telephone Encounter (Signed)
Klonopin refill request.

## 2020-11-06 ENCOUNTER — Encounter: Attending: Neurology | Primary: Family

## 2020-11-13 ENCOUNTER — Encounter

## 2020-11-14 MED ORDER — CLONAZEPAM 0.5 MG TAB
0.5 mg | ORAL_TABLET | ORAL | 0 refills | Status: DC
Start: 2020-11-14 — End: 2020-12-12

## 2020-11-14 NOTE — Telephone Encounter (Signed)
Klonopin refill request.

## 2020-12-04 ENCOUNTER — Encounter

## 2020-12-04 MED ORDER — VIIBRYD 40 MG TABLET
40 mg | ORAL_TABLET | ORAL | 0 refills | Status: DC
Start: 2020-12-04 — End: 2021-02-24

## 2020-12-04 MED ORDER — ARIPIPRAZOLE 2 MG TAB
2 mg | ORAL_TABLET | ORAL | 0 refills | Status: DC
Start: 2020-12-04 — End: 2021-02-12

## 2020-12-11 ENCOUNTER — Encounter

## 2020-12-12 MED ORDER — CLONAZEPAM 0.5 MG TAB
0.5 mg | ORAL_TABLET | ORAL | 0 refills | Status: DC
Start: 2020-12-12 — End: 2021-01-09

## 2020-12-12 NOTE — Telephone Encounter (Signed)
Klonopin refill request.

## 2021-01-01 ENCOUNTER — Encounter: Attending: Neurology | Primary: Family

## 2021-01-09 ENCOUNTER — Encounter

## 2021-01-09 MED ORDER — CLONAZEPAM 0.5 MG TAB
0.5 mg | ORAL_TABLET | ORAL | 0 refills | Status: DC
Start: 2021-01-09 — End: 2021-02-09

## 2021-01-09 NOTE — Telephone Encounter (Signed)
Klonopin refill request.

## 2021-01-11 ENCOUNTER — Ambulatory Visit: Admit: 2021-01-11 | Discharge: 2021-01-11 | Payer: MEDICARE | Attending: Registered Nurse | Primary: Family

## 2021-01-11 ENCOUNTER — Ambulatory Visit: Attending: Registered Nurse | Primary: Family

## 2021-01-11 DIAGNOSIS — F411 Generalized anxiety disorder: Secondary | ICD-10-CM

## 2021-01-11 NOTE — Progress Notes (Signed)
Charlene Detter is a 55 y.o. female who presents today for the following:  Chief Complaint   Patient presents with   ??? Follow-up     "I'm having some trouble since the loss of my family."   ??? Anxiety   ??? Depression       No Known Allergies    Current Outpatient Medications   Medication Sig   ??? clonazePAM (KlonoPIN) 0.5 mg tablet TAKE 1 TABLET BY MOUTH TWICE DAILY AS NEEDED FOR ANXIETY . DO NOT EXCEED 2 PER 24 HOURS   ??? Viibryd 40 mg tab tablet Take 1 tablet by mouth once daily   ??? ARIPiprazole (ABILIFY) 2 mg tablet TAKE 1 TABLET BY MOUTH AT BEDTIME   ??? topiramate (Topamax) 50 mg tablet Take  by mouth nightly.   ??? pravastatin (PRAVACHOL) 40 mg tablet Take 40 mg by mouth nightly.   ??? canagliflozin-metFORMIN (Invokamet) 150-500 mg tab Take  by mouth two (2) times a day.   ??? hydroCHLOROthiazide 25 mg tab 25 mg, lisinopriL 20 mg tab 20 mg Take  by mouth daily.   ??? metoprolol tartrate (LOPRESSOR) 25 mg tablet Take  by mouth two (2) times a day.   ??? estradioL (ESTRACE) 2 mg tablet Take  by mouth daily.   ??? busPIRone (BUSPAR) 10 mg tablet Take 10 mg by mouth daily.   ??? rizatriptan (MAXALT) 5 mg tablet Take 5 mg by mouth once as needed for Migraine. May repeat in 2 hours if needed   ??? dulaglutide (Trulicity) 0.75 mg/0.5 mL sub-q pen 0.75 mg by SubCUTAneous route every seven (7) days. Takes on Mondays   ??? aspirin delayed-release 81 mg tablet Take 81 mg by mouth daily.   ??? naproxen sodium (Aleve) 220 mg tablet Take 220 mg by mouth two (2) times daily as needed for Pain.     No current facility-administered medications for this visit.       Past Medical History:   Diagnosis Date   ??? Anxiety 01/11/2021   ??? Arthritis    ??? Bipolar 1 disorder ([facility]) 01/11/2021   ??? Diabetes ([facility])    ??? Diabetes mellitus type 2, controlled ([facility]) 01/11/2021   ??? Headache    ??? Headache 01/11/2021   ??? Hypercholesterolemia    ??? Hyperlipidemia 01/11/2021   ??? Hypertension    ??? Hypertension 01/11/2021       Past Surgical History:   Procedure Laterality Date   ??? HX  OOPHORECTOMY         Family History   Problem Relation Age of Onset   ??? Diabetes Mother    ??? Cancer Mother    ??? Diabetes Sister    ??? Heart Disease Sister    ??? Cancer Sister    ??? Diabetes Brother    ??? Heart Disease Brother        Social History     Socioeconomic History   ??? Marital status: DIVORCED     Spouse name: Not on file   ??? Number of children: Not on file   ??? Years of education: Not on file   ??? Highest education level: Not on file   Occupational History   ??? Not on file   Tobacco Use   ??? Smoking status: Never Smoker   ??? Smokeless tobacco: Never Used   Substance and Sexual Activity   ??? Alcohol use: Not Currently   ??? Drug use: Not Currently   ??? Sexual activity: Yes   Other Topics Concern   ???  Not on file   Social History Narrative   ??? Not on file     Social Determinants of Health     Financial Resource Strain:    ??? Difficulty of Paying Living Expenses: Not on file   Food Insecurity:    ??? Worried About Running Out of Food in the Last Year: Not on file   ??? Ran Out of Food in the Last Year: Not on file   Transportation Needs:    ??? Lack of Transportation (Medical): Not on file   ??? Lack of Transportation (Non-Medical): Not on file   Physical Activity:    ??? Days of Exercise per Week: Not on file   ??? Minutes of Exercise per Session: Not on file   Stress:    ??? Feeling of Stress : Not on file   Social Connections:    ??? Frequency of Communication with Friends and Family: Not on file   ??? Frequency of Social Gatherings with Friends and Family: Not on file   ??? Attends Religious Services: Not on file   ??? Active Member of Clubs or Organizations: Not on file   ??? Attends Banker Meetings: Not on file   ??? Marital Status: Not on file   Intimate Partner Violence:    ??? Fear of Current or Ex-Partner: Not on file   ??? Emotionally Abused: Not on file   ??? Physically Abused: Not on file   ??? Sexually Abused: Not on file   Housing Stability:    ??? Unable to Pay for Housing in the Last Year: Not on file   ??? Number of Places Lived  in the Last Year: Not on file   ??? Unstable Housing in the Last Year: Not on file         Ms. Treml follows up in clinic for bipolar disorder current episode depressed and anxiety disorder.  She is prescribed Abilify 2 mg tablet take 1 tablet daily, Viibryd 40 mg tablet take 1 tablet daily, clonazepam 0.5 mg tablet take 1 tablet twice daily and BuSpar 10 mg tablet take 1 tablet daily.  Patient last visited the clinic March 27, 2020.     [name], clean fully alert and oriented.  Gait is stable.  Mood is mildly depressed, however patient reports she just feels "sad" due to the loss of her second cousin who was like a best friend, mother and sister January of last year.  She also reports the loss of her ex-husband around the same time.  She is receiving counseling with Mrs. Gatten weekly.  She has history of suicidal thoughts back in November but denies at this present time.  Risk for suicide is low, protective factor-family and partner Patty.  Patient mentions some relationship strain because she has a new best friend Toniann Fail, so she has to juggle her time with Alexia Freestone and Toniann Fail.  Patient also reports that she was stressed out because her dog has health challenges-arthritis in hip and had to get some teeth pulled.  She is requesting to continue medications as prescribed.  She feels that counseling is very beneficial.        Review of Systems   Psychiatric/Behavioral: Positive for depression. [name].    All other systems reviewed and are negative.        Visit Vitals  Wt 85.6 kg (188 lb 12.8 oz)     Physical Exam  Psychiatric:  Attention and Perception: Attention and perception normal.         Mood and Affect: Mood is anxious and depressed.         Speech: Speech normal.         Behavior: Behavior is cooperative.         Thought Content: Thought content normal.         Cognition and Memory: Cognition and memory normal.         Judgment: Judgment normal.         Plan:    Continue the above medications as prescribed.  No refills needed on today's visit.  Follow-up with medical provider as appropriate.  Advised patient to avoid smoking, alcohol and drug use.  For emergencies-call 911 or go to the emergency department.

## 2021-01-17 MED ORDER — BUSPIRONE 10 MG TAB
10 mg | ORAL_TABLET | Freq: Every day | ORAL | 0 refills | Status: DC
Start: 2021-01-17 — End: 2021-02-24

## 2021-02-08 ENCOUNTER — Encounter

## 2021-02-09 DIAGNOSIS — U071 COVID-19: Secondary | ICD-10-CM

## 2021-02-09 NOTE — Telephone Encounter (Signed)
Klonopin refill request.

## 2021-02-09 NOTE — Telephone Encounter (Signed)
Please schedule patient for 10:45am on the 17th of February, and please leave her appointment on the schedule. Patient is coming in for [facility].

## 2021-02-09 NOTE — ED Notes (Signed)
PT states it is the anniversary of loved ones' deaths. She has been having sad dreams and thoughts of hurting herself, either cutting her wrists or taking pills. Lives with partner who dropped her off. Tried calling psychiatrist, but couldn't see until Thursday. Was unable to get ahold of her therapist.

## 2021-02-10 ENCOUNTER — Inpatient Hospital Stay
Admit: 2021-02-10 | Discharge: 2021-02-10 | Disposition: A | Discharge status: Home / Self Care | Payer: MEDICARE | Attending: Emergency Medicine

## 2021-02-10 LAB — DRUG SCREEN, URINE
AMPHETAMINES: NEGATIVE
Amphetamine Screen, Urine: NEGATIVE
BARBITURATES: NEGATIVE
BENZODIAZEPINES: NEGATIVE
Barbiturate Screen, Urine: NEGATIVE
Benzodiazepine Screen, Urine: NEGATIVE
COCAINE: NEGATIVE
Cocaine Screen Urine: NEGATIVE
ECSTASY, ECST: NEGATIVE
ECSTASY, MDMA: NEGATIVE
METHADONE: NEGATIVE
Methadone Screen, Urine: NEGATIVE
OPIATES: NEGATIVE
Opiate Screen, Urine: NEGATIVE
PCP Screen, Urine: NEGATIVE
PCP(PHENCYCLIDINE): NEGATIVE
THC (TH-CANNABINOL): NEGATIVE
THC Screen, Urine: NEGATIVE

## 2021-02-10 LAB — CBC WITH AUTO DIFFERENTIAL
Basophils %: 1 % (ref 0.0–2.5)
Basophils Absolute: 0.1 10*3/uL (ref 0.0–0.2)
Eosinophils %: 4 % — ABNORMAL HIGH (ref 0.9–2.9)
Eosinophils Absolute: 0.2 10*3/uL (ref 0.0–0.7)
Hematocrit: 40 % (ref 36–46)
Hemoglobin: 13 g/dL — ABNORMAL LOW (ref 13.5–17.5)
Lymphocytes %: 43 % (ref 20.5–51.1)
Lymphocytes Absolute: 2.8 10*3/uL (ref 1.0–4.8)
MCH: 26.5 PG — ABNORMAL LOW (ref 31–34)
MCHC: 32.6 g/dL (ref 31.0–36.0)
MCV: 81.2 FL (ref 80–100)
MPV: 9.2 FL (ref 6.5–11.5)
Monocytes %: 7 % (ref 1.7–9.3)
Monocytes Absolute: 0.4 10*3/uL (ref 0.2–2.4)
NRBC Absolute: 0.01 10*3/uL
Neutrophils %: 45 % (ref 42–75)
Neutrophils Absolute: 3 10*3/uL (ref 1.8–7.7)
Nucleated RBCs: 0.1 PER 100 WBC
Platelets: 153 10*3/uL (ref 150–400)
RBC: 4.93 M/uL (ref 4.50–5.90)
RDW: 15.2 % — ABNORMAL HIGH (ref 11.5–14.5)
WBC: 6.5 10*3/uL (ref 4.4–11.3)

## 2021-02-10 LAB — COMPREHENSIVE METABOLIC PANEL
ALT: 43 U/L (ref 12–78)
AST: 36 U/L (ref 15–37)
Albumin/Globulin Ratio: 1 — ABNORMAL LOW (ref 1.1–2.2)
Albumin: 3.3 g/dL — ABNORMAL LOW (ref 3.5–5.0)
Alkaline Phosphatase: 82 U/L (ref 45–117)
Anion Gap: 10 mmol/L (ref 5–15)
BUN: 16 mg/dL (ref 6–20)
Bun/Cre Ratio: 18 (ref 12–20)
CO2: 26 mmol/L (ref 21–32)
Calcium: 9.3 mg/dL (ref 8.5–10.1)
Chloride: 101 mmol/L (ref 97–108)
Creatinine: 0.88 mg/dL (ref 0.55–1.02)
EGFR IF NonAfrican American: >60 mL/min/{1.73_m2} (ref >60)
GFR African American: >60 mL/min/{1.73_m2} (ref >60)
Globulin: 3.3 g/dL (ref 2.0–4.0)
Glucose: 147 mg/dL — ABNORMAL HIGH (ref 65–100)
Potassium: 3.8 mmol/L (ref 3.5–5.1)
Sodium: 137 mmol/L (ref 136–145)
Total Bilirubin: 0.3 mg/dL (ref 0.2–1.0)
Total Protein: 6.6 g/dL (ref 6.4–8.2)

## 2021-02-10 LAB — POCT GLUCOSE: POC Glucose: 157 mg/dL — ABNORMAL HIGH (ref 65–117)

## 2021-02-10 LAB — COVID-19 WITH INFLUENZA A/B
Influenza A By PCR: NOT DETECTED
Influenza A by PCR: NOT DETECTED
Influenza B By PCR: NOT DETECTED
Influenza B by PCR: NOT DETECTED
SARS-CoV-2 by PCR: DETECTED — AB
SARS-CoV-2: DETECTED — AB

## 2021-02-10 LAB — URINALYSIS W/ RFLX MICROSCOPIC
Bilirubin, Urine: NEGATIVE
Bilirubin: NEGATIVE
Blood, Urine: NEGATIVE
Blood: NEGATIVE
Glucose, Ur: >1000 mg/dL — AB
Glucose: >1000 mg/dL — AB
Ketone: NEGATIVE mg/dL
Ketones, Urine: NEGATIVE mg/dL
Leukocyte Esterase, Urine: NEGATIVE
Leukocyte Esterase: NEGATIVE
Nitrite, Urine: NEGATIVE
Nitrites: NEGATIVE
Protein, UA: NEGATIVE mg/dL
Protein: NEGATIVE mg/dL
Specific Gravity, UA: 1.025 (ref 1.003–1.030)
Specific gravity: 1.025 (ref 1.003–1.030)
Urobilinogen, UA, POCT: 0.2 EU/dL (ref 0.2–1.0)
Urobilinogen: 0.2 EU/dL (ref 0.2–1.0)
pH (UA): 6 (ref 5.0–8.0)
pH, UA: 6 (ref 5.0–8.0)

## 2021-02-10 LAB — SALICYLATE
DOSE AMOUNT: NOT DETECTED Units
DOSE DATE: NOT DETECTED
Reported dose date: NOT DETECTED
Reported dose:: NOT DETECTED Units
Salicylate level: <1.7 mg/dL — ABNORMAL LOW (ref 2.8–20.0)
Salicylate: <1.7 mg/dL — ABNORMAL LOW (ref 2.8–20.0)

## 2021-02-10 LAB — ACETAMINOPHEN LEVEL
Acetaminophen Level: <10 ug/mL — ABNORMAL LOW (ref 10–30)
DOSE AMOUNT: NOT DETECTED Units
DOSE DATE: NOT DETECTED

## 2021-02-10 LAB — ETHYL ALCOHOL
ALCOHOL(ETHYL),SERUM: <4 mg/dL (ref <10)
Ethyl Alcohol: <4 mg/dL (ref <10)

## 2021-02-10 LAB — ACETAMINOPHEN
Acetaminophen level: <10 ug/mL — ABNORMAL LOW (ref 10–30)
Reported dose date: NOT DETECTED
Reported dose:: NOT DETECTED Units

## 2021-02-10 LAB — METABOLIC PANEL, COMPREHENSIVE
A-G Ratio: 1 — ABNORMAL LOW (ref 1.1–2.2)
ALT (SGPT): 43 U/L (ref 12–78)
AST (SGOT): 36 U/L (ref 15–37)
Albumin: 3.3 g/dL — ABNORMAL LOW (ref 3.5–5.0)
Alk. phosphatase: 82 U/L (ref 45–117)
Anion gap: 10 mmol/L (ref 5–15)
BUN/Creatinine ratio: 18 (ref 12–20)
BUN: 16 mg/dL (ref 6–20)
Bilirubin, total: 0.3 mg/dL (ref 0.2–1.0)
CO2: 26 mmol/L (ref 21–32)
Calcium: 9.3 mg/dL (ref 8.5–10.1)
Chloride: 101 mmol/L (ref 97–108)
Creatinine: 0.88 mg/dL (ref 0.55–1.02)
GFR est AA: >60 mL/min/{1.73_m2} (ref >60)
GFR est non-AA: >60 mL/min/{1.73_m2} (ref >60)
Globulin: 3.3 g/dL (ref 2.0–4.0)
Glucose: 147 mg/dL — ABNORMAL HIGH (ref 65–100)
Potassium: 3.8 mmol/L (ref 3.5–5.1)
Protein, total: 6.6 g/dL (ref 6.4–8.2)
Sodium: 137 mmol/L (ref 136–145)

## 2021-02-10 LAB — CBC WITH AUTOMATED DIFF
ABS. BASOPHILS: 0.1 10*3/uL (ref 0.0–0.2)
ABS. EOSINOPHILS: 0.2 10*3/uL (ref 0.0–0.7)
ABS. LYMPHOCYTES: 2.8 10*3/uL (ref 1.0–4.8)
ABS. MONOCYTES: 0.4 10*3/uL (ref 0.2–2.4)
ABS. NEUTROPHILS: 3 10*3/uL (ref 1.8–7.7)
ABSOLUTE NRBC: 0.01 10*3/uL
BASOPHILS: 1 % (ref 0.0–2.5)
EOSINOPHILS: 4 % — ABNORMAL HIGH (ref 0.9–2.9)
HCT: 40 % (ref 36–46)
HGB: 13 g/dL — ABNORMAL LOW (ref 13.5–17.5)
LYMPHOCYTES: 43 % (ref 20.5–51.1)
MCH: 26.5 PG — ABNORMAL LOW (ref 31–34)
MCHC: 32.6 g/dL (ref 31.0–36.0)
MCV: 81.2 FL (ref 80–100)
MONOCYTES: 7 % (ref 1.7–9.3)
MPV: 9.2 FL (ref 6.5–11.5)
NEUTROPHILS: 45 % (ref 42–75)
NRBC: 0.1 PER 100 WBC
PLATELET: 153 10*3/uL (ref 150–400)
RBC: 4.93 M/uL (ref 4.50–5.90)
RDW: 15.2 % — ABNORMAL HIGH (ref 11.5–14.5)
WBC: 6.5 10*3/uL (ref 4.4–11.3)

## 2021-02-10 LAB — GLUCOSE, POC: Glucose (POC): 157 mg/dL — ABNORMAL HIGH (ref 65–117)

## 2021-02-10 MED ORDER — TOPIRAMATE 25 MG TAB
25 mg | Freq: Every evening | ORAL | Status: DC
Start: 2021-02-10 — End: 2021-02-10

## 2021-02-10 MED ORDER — LISINOPRIL 20 MG TAB
20 mg | Freq: Every day | ORAL | Status: DC
Start: 2021-02-10 — End: 2021-02-10

## 2021-02-10 MED ORDER — BUSPIRONE 5 MG TAB
5 mg | Freq: Every day | ORAL | Status: DC
Start: 2021-02-10 — End: 2021-02-10
  Administered 2021-02-10: 15:00:00 via ORAL

## 2021-02-10 MED ORDER — ARIPIPRAZOLE 2 MG TAB
2 mg | Freq: Every evening | ORAL | Status: DC
Start: 2021-02-10 — End: 2021-02-10

## 2021-02-10 MED ORDER — METOPROLOL TARTRATE 25 MG TAB
25 mg | Freq: Two times a day (BID) | ORAL | Status: DC
Start: 2021-02-10 — End: 2021-02-10

## 2021-02-10 MED ORDER — CLONAZEPAM 0.5 MG TAB
0.5 mg | ORAL_TABLET | ORAL | 0 refills | Status: DC
Start: 2021-02-10 — End: 2021-02-24

## 2021-02-10 MED FILL — ARIPIPRAZOLE 2 MG TAB: 2 mg | ORAL | Qty: 1

## 2021-02-10 MED FILL — BUSPIRONE 5 MG TAB: 5 mg | ORAL | Qty: 2

## 2021-02-10 NOTE — ED Notes (Signed)
Spoke with B[facility] at [facility] since we have not received a callback from [facility] intake at [facility].  Reports that their intake person called out & we need to call our psychiatrist on call & go from there.  Spoke with [employer] & also Susie here to update them.

## 2021-02-10 NOTE — ED Notes (Signed)
Hospital bed brought down for patient.

## 2021-02-10 NOTE — ED Notes (Signed)
Patient discharged home at this time with family member.  Patient denies SI/HI at this time.  Patient encouraged to return to ED if she begins to have SI/HI again & to follow up with primary therapist as needed.  Patient verbalized understanding of discharge instructions at this time.

## 2021-02-10 NOTE — ED Notes (Signed)
Attempted to call [facility]  BH intake at this time, no answer, 2nd voicemail left.

## 2021-02-10 NOTE — ED Notes (Signed)
Called [facility] behavior health intake at [phone], no answer, voicemail left with callback number.

## 2021-02-10 NOTE — ED Provider Notes (Signed)
EMERGENCY DEPARTMENT HISTORY AND PHYSICAL EXAM  ?    Date: 02/09/2021  Patient Name: Mallory Bonilla    History of Presenting Illness    Patient presents with:  Mental Health Problem  Sleep Problem      History Provided By: Patient    HPI: Mallory Bonilla, 55 y.o. female with a past medical history significant for depression, diabetes, hypertension and hyperlipidemia presents to the ED with cc of severe depression with suicidal ideation for a few weeks.  She has no specific suicidal plan.      There are no other complaints, changes, or physical findings at this time.    PCP: Sharlette Dense, NP    Current Outpatient Medications:  clonazePAM (KlonoPIN) 0.5 mg tablet, TAKE 1 TABLET BY MOUTH TWICE DAILY AS NEEDED FOR ANXIETY . DO NOT EXCEED 2 PER 24 HOURS, Disp: 60 Tablet, Rfl: 0  busPIRone (BUSPAR) 10 mg tablet, Take 1 Tablet by mouth daily for 90 days., Disp: 90 Tablet, Rfl: 0  Viibryd 40 mg tab tablet, Take 1 tablet by mouth once daily, Disp: 90 Tablet, Rfl: 0  ARIPiprazole (ABILIFY) 2 mg tablet, TAKE 1 TABLET BY MOUTH AT BEDTIME, Disp: 90 Tablet, Rfl: 0  topiramate (Topamax) 50 mg tablet, Take  by mouth nightly., Disp: , Rfl:   pravastatin (PRAVACHOL) 40 mg tablet, Take 40 mg by mouth nightly., Disp: , Rfl:   canagliflozin-metFORMIN (Invokamet) 150-500 mg tab, Take  by mouth two (2) times a day., Disp: , Rfl:   hydroCHLOROthiazide 25 mg tab 25 mg, lisinopriL 20 mg tab 20 mg, Take  by mouth daily., Disp: , Rfl:   metoprolol tartrate (LOPRESSOR) 25 mg tablet, Take  by mouth two (2) times a day., Disp: , Rfl:   estradioL (ESTRACE) 2 mg tablet, Take  by mouth daily., Disp: , Rfl:   rizatriptan (MAXALT) 5 mg tablet, Take 5 mg by mouth once as needed for Migraine. May repeat in 2 hours if needed, Disp: , Rfl:   dulaglutide (Trulicity) 2.72 ZD/6.6 mL sub-q pen, 0.75 mg by SubCUTAneous route every seven (7) days. Takes on Mondays, Disp: , Rfl:   aspirin delayed-release 81 mg tablet, Take 81 mg by mouth daily., Disp: , Rfl:    naproxen sodium (Aleve) 220 mg tablet, Take 220 mg by mouth two (2) times daily as needed for Pain., Disp: , Rfl:         Past History    Past Medical History:  Past Medical History:  01/11/2021: Anxiety  No date: Arthritis  01/11/2021: Bipolar 1 disorder ([place])  No date: Diabetes ([place])  01/11/2021: Diabetes mellitus type 2, controlled ([place])  No date: Headache  01/11/2021: Headache  No date: Hypercholesterolemia  01/11/2021: Hyperlipidemia  No date: Hypertension  01/11/2021: Hypertension    Past Surgical History:  Past Surgical History:  No date: HX OOPHORECTOMY    Family History:  Review of patient's family history indicates:  Problem: Diabetes     Relation: Mother         Age of Onset: (Not Specified)  Problem: Cancer     Relation: Mother         Age of Onset: (Not Specified)  Problem: Diabetes     Relation: Sister         Age of Onset: (Not Specified)  Problem: Heart Disease     Relation: Sister         Age of Onset: (Not Specified)  Problem: Cancer     Relation: Sister  Age of Onset: (Not Specified)  Problem: Diabetes     Relation: Brother         Age of Onset: (Not Specified)  Problem: Heart Disease     Relation: Brother         Age of Onset: (Not Specified)      Social History:  Social History   Tobacco Use     Smoking status: Never Smoker     Smokeless tobacco: Never Used   Alcohol use: Not Currently   Drug use: Not Currently      Allergies:  No Known Allergies      Review of Systems  '@ROSBYAGE' @    Physical Exam  '@PHYEXAMBYAGE' @    Diagnostic Study Results    Labs -  Recent Results (from the past 12 hour(s))  -CBC WITH AUTOMATED DIFF:   Collection Time: 02/09/21 11:15 PM      Result                      Value             Ref Range          WBC                         6.5               4.4 - 11.3 K*      RBC                         4.93              4.50 - 5.90 *      HGB                         13.0 (L)          13.5 - 17.5 *      HCT                         40.0              36 - 46 %          MCV                          81.2              80 - 100 FL        MCH                         26.5 (L)          31 - 34 PG         MCHC                        32.6              31.0 - 36.0 *      RDW                         15.2 (H)          11.5 - 14.5 %      PLATELET  153               150 - 400 K/*      MPV                         9.2               6.5 - 11.5 FL      NRBC                        0.1               PER 100 WBC        ABSOLUTE NRBC               0.01              K/uL               NEUTROPHILS                 45                42 - 75 %          LYMPHOCYTES                 43                20.5 - 51.1 %      MONOCYTES                   7                 1.7 - 9.3 %        EOSINOPHILS                 4 (H)             0.9 - 2.9 %        BASOPHILS                   1                 0.0 - 2.5 %        ABS. NEUTROPHILS            3.0               1.8 - 7.7 K/*      ABS. LYMPHOCYTES            2.8               1.0 - 4.8 K/*      ABS. MONOCYTES              0.4               0.2 - 2.4 K/*      ABS. EOSINOPHILS            0.2               0.0 - 0.7 K/*      ABS. BASOPHILS              0.1               0.0 - 0.2 K/*  -METABOLIC PANEL, COMPREHENSIVE:   Collection Time: 02/09/21 11:15 PM      Result  Value             Ref Range          Sodium                      137               136 - 145 mm*      Potassium                   3.8               3.5 - 5.1 mm*      Chloride                    101               97 - 108 mmo*      CO2                         26                21 - 32 mmol*      Anion gap                   10                5 - 15 mmol/L      Glucose                     147 (H)           65 - 100 mg/*      BUN                         16                6 - 20 mg/dL       Creatinine                  0.88              0.55 - 1.02 *      BUN/Creatinine ratio        18                12 - 20            GFR est AA                  >60               >60 ml/min/1*      GFR est  non-AA              >60               >60 ml/min/1*      Calcium                     9.3               8.5 - 10.1 m*      Bilirubin, total            0.3               0.2 - 1.0 mg*      AST (SGOT)  36                15 - 37 U/L        ALT (SGPT)                  43                12 - 78 U/L        Alk. phosphatase            82                45 - 117 U/L       Protein, total              6.6               6.4 - 8.2 g/*      Albumin                     3.3 (L)           3.5 - 5.0 g/*      Globulin                    3.3               2.0 - 4.0 g/*      A-G Ratio                   1.0 (L)           1.1 - 2.2      -ETHYL ALCOHOL:   Collection Time: 02/09/21 11:15 PM      Result                      Value             Ref Range          ALCOHOL(ETHYL),SERUM        <4 <10 mg/dL   -URINALYSIS W/ RFLX MICROSCOPIC:   Collection Time: 02/09/21 11:37 PM      Result                      Value             Ref Range          Color                       Yellow/Straw                         Appearance                  Clear             Clear              Specific gravity            1.025             1.003 - 1.03*      pH (UA)                     6.0               5.0 - 8.0          Protein  Negative          Negative mg/*      Glucose                     >1,000 (A)        Negative mg/*      Ketone                      Negative          Negative mg/*      Bilirubin                   Negative          Negative           Blood                       Negative          Negative           Urobilinogen                0.2               0.2 - 1.0 EU*      Nitrites                    Negative          Negative           Leukocyte Esterase          Negative          Negative       -DRUG SCREEN, URINE:   Collection Time: 02/09/21 11:37 PM      Result                      Value             Ref Range          AMPHETAMINES                Negative          Negative           BARBITURATES                Negative           Negative           BENZODIAZEPINES             Negative          Negative           COCAINE                     Negative          Negative           ECSTASY, MDMA               Negative          Negative           METHADONE                   Negative          Negative           OPIATES                     Negative  Negative           PCP(PHENCYCLIDINE)          Negative          Negative           THC (TH-CANNABINOL)         Negative          Negative           Drug screen comment                                          This test is a screen for drugs of abuse in a medical setting only (i.e., they are unconfirmed results and as such must not be used for non-medical purposes, e.g.,employment testing, legal testing). Due to its inherent nature, false positive (FP) and false negative (FN) results may be obtained. Therefore, if necessary for medical care, recommend confirmation of positive findings by GC/MS.    Radiologic Studies -   No orders to display  CT Results  (Last 48 hours)   None     CXR Results  (Last 48 hours)   None       Medical Decision Making and ED Course  I am the first provider for this patient.    I reviewed the vital signs, available nursing notes, past medical history, past surgical history, family history and social history.    Vital Signs-Reviewed the patient's vital signs.  Empty flowsheet group.        Records Reviewed: Nursing Notes and Old Medical Records    Provider Notes (Medical Decision Making):   Medically clear patient    The patient presents with depression, suicidal ideation.      ED Course:   Patient is voluntary.  Covid is positive.  Initial assessment performed. The patients presenting problems have been discussed, and they are in agreement with the care plan formulated and outlined with them.  I have encouraged them to ask questions as they arise throughout their visit.                Disposition            DISCHARGE PLAN:  1. Current Discharge Medication  List    CONTINUE these medications which have NOT CHANGED    clonazePAM (KlonoPIN) 0.5 mg tablet  TAKE 1 TABLET BY MOUTH TWICE DAILY AS NEEDED FOR ANXIETY . 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  Qty: 90 Tablet Refills: 0    Viibryd 40 mg tab tablet  Take 1 tablet by mouth once daily  Qty: 90 Tablet Refills: 0  Associated Diagnoses:Depression, unspecified depression type    ARIPiprazole (ABILIFY) 2 mg tablet  TAKE 1 TABLET BY MOUTH AT BEDTIME  Qty: 90 Tablet Refills: 0  Associated Diagnoses:Depression, unspecified depression type    topiramate (Topamax) 50 mg tablet  Take  by mouth nightly.    pravastatin (PRAVACHOL) 40 mg tablet  Take 40 mg by mouth nightly.    canagliflozin-metFORMIN (Invokamet) 150-500 mg tab  Take  by mouth two (2) times a day.    hydroCHLOROthiazide 25 mg tab 25 mg, lisinopriL 20 mg tab 20 mg  Take  by mouth daily.  metoprolol tartrate (LOPRESSOR) 25 mg tablet  Take  by mouth two (2) times a day.    estradioL (ESTRACE) 2 mg tablet  Take  by mouth daily.    rizatriptan (MAXALT) 5 mg tablet  Take 5 mg by mouth once as needed for Migraine. May repeat in 2 hours if needed    dulaglutide (Trulicity) 1.32 GM/0.1 mL sub-q pen  0.75 mg by SubCUTAneous route every seven (7) days. Takes on Mondays    aspirin delayed-release 81 mg tablet  Take 81 mg by mouth daily.    naproxen sodium (Aleve) 220 mg tablet  Take 220 mg by mouth two (2) times daily as needed for Pain.        2. Follow-up Information    None    3.  Return to ED if worse     Diagnosis    Clinical Impression: Depression, suicidal ideation      Luther Redo, MD    Please note that this dictation was completed with Dragon, the computer voice recognition software.  Quite often unanticipated grammatical, syntax, homophones, and other interpretive errors are inadvertently transcribed  by the computer software.  Please disregard these errors.  Please excuse any errors that have escaped final proofreading.  Thank you.    ?           Past Medical History:   Diagnosis Date   ??? Anxiety 01/11/2021   ??? Arthritis    ??? Bipolar 1 disorder ([place]) 01/11/2021   ??? Diabetes ([place])    ??? Diabetes mellitus type 2, controlled ([place]) 01/11/2021   ??? Headache    ??? Headache 01/11/2021   ??? Hypercholesterolemia    ??? Hyperlipidemia 01/11/2021   ??? Hypertension    ??? Hypertension 01/11/2021       Past Surgical History:   Procedure Laterality Date   ??? HX OOPHORECTOMY           Family History:   Problem Relation Age of Onset   ??? Diabetes Mother    ??? Cancer Mother    ??? Diabetes Sister    ??? Heart Disease Sister    ??? Cancer Sister    ??? Diabetes Brother    ??? Heart Disease Brother        Social History     Socioeconomic History   ??? Marital status: DIVORCED     Spouse name: Not on file   ??? Number of children: Not on file   ??? Years of education: Not on file   ??? Highest education level: Not on file   Occupational History   ??? Not on file   Tobacco Use   ??? Smoking status: Never Smoker   ??? Smokeless tobacco: Never Used   Substance and Sexual Activity   ??? Alcohol use: Not Currently   ??? Drug use: Not Currently   ??? Sexual activity: Yes   Other Topics Concern   ??? Not on file   Social History Narrative   ??? Not on file     Social Determinants of Health     Financial Resource Strain:    ??? Difficulty of Paying Living Expenses: Not on file   Food Insecurity:    ??? Worried About Running Out of Food in the Last Year: Not on file   ??? Ran Out of Food in the Last Year: Not on file   Transportation Needs:    ??? Lack of Transportation (Medical): Not on file   ??? Lack of Transportation (Non-Medical):  Not on file   Physical Activity:    ??? Days of Exercise per Week: Not on file   ??? Minutes of Exercise per Session: Not on file   Stress:    ??? Feeling of Stress : Not on file   Social Connections:    ??? Frequency of Communication with Friends and Family: Not on file   ???  Frequency of Social Gatherings with Friends and Family: Not on file   ??? Attends Religious Services: Not on file   ??? Active Member of Clubs or Organizations: Not on file   ??? Attends Archivist Meetings: Not on file   ??? Marital Status: Not on file   Intimate Partner Violence:    ??? Fear of Current or Ex-Partner: Not on file   ??? Emotionally Abused: Not on file   ??? Physically Abused: Not on file   ??? Sexually Abused: Not on file   Housing Stability:    ??? Unable to Pay for Housing in the Last Year: Not on file   ??? Number of Places Lived in the Last Year: Not on file   ??? Unstable Housing in the Last Year: Not on file         ALLERGIES: Patient has no known allergies.    Review of Systems   Constitutional: Negative.    HENT: Negative.    Eyes: Negative.    Respiratory: Negative.    Cardiovascular: Negative.    Gastrointestinal: Negative.    Endocrine: Negative.    Genitourinary: Negative.    Musculoskeletal: Negative.    Neurological: Negative.    Hematological: Negative.    Psychiatric/Behavioral: Positive for dysphoric mood and suicidal ideas.       Vitals:    02/09/21 2222   BP: (!) 143/80   Pulse: 79   Resp: 18   Temp: 97.8 ??F (36.6 ??C)   SpO2: 97%   Weight: 86.5 kg (190 lb 11.2 oz)   Height: '5\' 5"'  (1.651 m)            Physical Exam  Vitals and nursing note reviewed.   Constitutional:       Appearance: Normal appearance.   HENT:      Head: Normocephalic and atraumatic.      Right Ear: External ear normal.      Left Ear: External ear normal.      Nose: Nose normal.      Mouth/Throat:      Mouth: Mucous membranes are moist.      Pharynx: Oropharynx is clear.   Eyes:      Extraocular Movements: Extraocular movements intact.      Conjunctiva/sclera: Conjunctivae normal.      Pupils: Pupils are equal, round, and reactive to light.   Cardiovascular:      Rate and Rhythm: Normal rate and regular rhythm.      Pulses: Normal pulses.      Heart sounds: Normal heart sounds.   Pulmonary:      Effort: Pulmonary effort is  normal.      Breath sounds: Normal breath sounds.   Abdominal:      General: Abdomen is flat. Bowel sounds are normal.      Palpations: Abdomen is soft.   Musculoskeletal:         General: Normal range of motion.      Cervical back: Normal range of motion and neck supple.   Skin:     General: Skin is warm and dry.  Neurological:      General: No focal deficit present.      Mental Status: She is alert and oriented to person, place, and time.   Psychiatric:         Attention and Perception: Attention and perception normal.         Mood and Affect: Mood is depressed.         Speech: Speech normal.         Behavior: Behavior normal.         Thought Content: Thought content is not paranoid or delusional. Thought content includes suicidal ideation. Thought content does not include homicidal ideation.          MDM       Procedures

## 2021-02-10 NOTE — ED Notes (Signed)
Suellen called [facility] at [facility] & they stated that the ER physician here needs to have a doc-to-doc conversation with Dr Donalynn Furlong & go from there.  Dr Percell Locus updated at this time.

## 2021-02-12 ENCOUNTER — Telehealth

## 2021-02-12 MED ORDER — ARIPIPRAZOLE 5 MG TAB
5 mg | ORAL_TABLET | Freq: Every evening | ORAL | 0 refills | Status: DC
Start: 2021-02-12 — End: 2021-02-23

## 2021-02-12 NOTE — Telephone Encounter (Signed)
Patient reports increasing depression and crying spells. Advise patient to increase Buspar to 1 tab twice daily, patient reports that she would rather adjust Abilify dose. Abilify changed to 5 mg 1 tab daily for mood/depression. No suicidal/homicidal thinking. She reports having a lingering cough due to COVID otherwise she is doing okay. She will contact the clinic for any issues.

## 2021-02-12 NOTE — Progress Notes (Signed)
02/12/2021  11:19 AM    Patient contacted regarding COVID-19 diagnosis. Discussed COVID-19 related testing which was available at this time. Test results were positive. Patient informed of results, if available? yes.     Patient outreach attempt by this ACM today to perform initial post-discharge assessment. ACM was unable to reach patient today; lvm requesting a return phone call to this ACM.

## 2021-02-12 NOTE — Progress Notes (Signed)
02/12/2021  3:16 PM    Patient contacted regarding COVID-19 diagnosis. Discussed COVID-19 related testing which was available at this time. Test results were positive. Patient informed of results, if available? yes.     Ambulatory Care Manager contacted the patient by telephone to perform post discharge assessment. Call within 2 business days of discharge: Yes Verified name and DOB with patient as identifiers. Provided introduction to self, and explanation of the CTN/ACM role, and reason for call due to risk factors for infection and/or exposure to COVID-19.     Symptoms reviewed with patient who verbalized the following symptoms: cough, no new or worsening symptoms.      Due to no new or worsening symptoms encounter was not routed to provider for escalation. Discussed follow-up appointments. If no appointment was previously scheduled, appointment scheduling offered:  no.  [facility] follow up appointment(s):   Future Appointments   Date Time Provider Department Center   02/15/2021 10:45 AM Deatra Canter, NP [facility] AMB   05/24/2021 10:00 AM Allen-Blaine, Marylene Land, NP [facility] AMB     Non-[facility] follow up appointment(s): N/A    Interventions to address risk factors: Obtained and reviewed discharge summary and/or continuity of care documents, Reviewed and followed up on pending diagnostic tests and treatments-Reviewed COVID positive test results, Education of patient/family/caregiver/guardian to support self-management-COVID self management, isolation guidelines, and when to seek immediate medical care and During chart review, ACM noted pt reached out to behavioral health earlier today about ongoing concerns. ACM spoke with pt about this to make sure her concerns were taken care of with behavioral health. Pt denies any suicidal ideations at this time when asked by ACM.     Advance Care Planning:   Does patient have an Advance Directive: patient declined education.     Educated patient about risk for severe COVID-19 due to  risk factors according to CDC guidelines. ACM reviewed discharge instructions, medical action plan and red flag symptoms with the patient who verbalized understanding. Discussed COVID vaccination status: yes. Education provided on COVID-19 vaccination as appropriate. Discussed exposure protocols and quarantine with CDC Guidelines. Patient was given an opportunity to verbalize any questions and concerns and agrees to contact ACM or health care provider for questions related to their healthcare.    Reviewed and educated patient on any new and changed medications related to discharge diagnosis     Was patient discharged with a pulse oximeter? no Discussed and confirmed pulse oximeter discharge instructions and when to notify provider or seek emergency care.    ACM provided contact information. Plan for follow-up call in 5-7 days based on severity of symptoms and risk factors.

## 2021-02-12 NOTE — Telephone Encounter (Signed)
Pt said she is having a real hard time and wants to come in today if possible -please review and advise

## 2021-02-15 ENCOUNTER — Inpatient Hospital Stay: Admit: 2021-02-16 | Payer: MEDICARE | Primary: Family

## 2021-02-15 ENCOUNTER — Inpatient Hospital Stay
Admit: 2021-02-15 | Discharge: 2021-02-21 | Disposition: A | Discharge status: Other Acute Inpatient Hospital | Payer: MEDICARE | Source: Other Acute Inpatient Hospital | Attending: Internal Medicine | Admitting: Internal Medicine

## 2021-02-15 ENCOUNTER — Inpatient Hospital Stay
Admit: 2021-02-15 | Discharge: 2021-02-15 | Disposition: A | Discharge status: Other Acute Inpatient Hospital | Payer: MEDICARE | Attending: Emergency Medicine

## 2021-02-15 ENCOUNTER — Emergency Department: Admit: 2021-02-15 | Payer: MEDICARE | Primary: Family

## 2021-02-15 ENCOUNTER — Inpatient Hospital Stay: Admit: 2021-02-15 | Payer: MEDICARE | Primary: Family

## 2021-02-15 ENCOUNTER — Encounter: Attending: Registered Nurse | Primary: Family

## 2021-02-15 DIAGNOSIS — T424X2A Poisoning by benzodiazepines, intentional self-harm, initial encounter: Secondary | ICD-10-CM

## 2021-02-15 LAB — DRUG SCREEN, URINE
AMPHETAMINES: NEGATIVE
Amphetamine Screen, Urine: NEGATIVE
BARBITURATES: NEGATIVE
BENZODIAZEPINES: NEGATIVE
Barbiturate Screen, Urine: NEGATIVE
Benzodiazepine Screen, Urine: NEGATIVE
COCAINE: NEGATIVE
Cocaine Screen Urine: NEGATIVE
Drug Screen Comment:: 5
Drug screen comment: 5
ECSTASY, ECST: NEGATIVE
ECSTASY, MDMA: NEGATIVE
METHADONE: NEGATIVE
Methadone Screen, Urine: NEGATIVE
OPIATES: NEGATIVE
Opiate Screen, Urine: NEGATIVE
PCP Screen, Urine: NEGATIVE
PCP(PHENCYCLIDINE): NEGATIVE
THC (TH-CANNABINOL): NEGATIVE
THC Screen, Urine: NEGATIVE

## 2021-02-15 LAB — COVID-19 WITH INFLUENZA A/B
Influenza A By PCR: NOT DETECTED
Influenza A by PCR: NOT DETECTED
Influenza B By PCR: NOT DETECTED
Influenza B by PCR: NOT DETECTED
SARS-CoV-2 by PCR: NOT DETECTED
SARS-CoV-2: NOT DETECTED

## 2021-02-15 LAB — COMPREHENSIVE METABOLIC PANEL
ALT: 52 U/L (ref 12–78)
AST: 53 U/L — ABNORMAL HIGH (ref 15–37)
Albumin/Globulin Ratio: 1.1 (ref 1.1–2.2)
Albumin: 3.6 g/dL (ref 3.5–5.0)
Alkaline Phosphatase: 98 U/L (ref 45–117)
Anion Gap: 11 mmol/L (ref 5–15)
BUN: 20 mg/dL (ref 6–20)
Bun/Cre Ratio: 20 (ref 12–20)
CO2: 25 mmol/L (ref 21–32)
Calcium: 9 mg/dL (ref 8.5–10.1)
Chloride: 100 mmol/L (ref 97–108)
Creatinine: 1.01 mg/dL (ref 0.55–1.02)
EGFR IF NonAfrican American: 57 mL/min/{1.73_m2} — ABNORMAL LOW (ref >60)
GFR African American: >60 mL/min/{1.73_m2} (ref >60)
Globulin: 3.4 g/dL (ref 2.0–4.0)
Glucose: 217 mg/dL — ABNORMAL HIGH (ref 65–100)
Potassium: 3.9 mmol/L (ref 3.5–5.1)
Sodium: 136 mmol/L (ref 136–145)
Total Bilirubin: 0.6 mg/dL (ref 0.2–1.0)
Total Protein: 7 g/dL (ref 6.4–8.2)

## 2021-02-15 LAB — CBC WITH AUTO DIFFERENTIAL
Basophils %: 1 % (ref 0.0–2.5)
Basophils Absolute: 0.1 10*3/uL (ref 0.0–0.2)
Eosinophils %: 5 % — ABNORMAL HIGH (ref 0.9–2.9)
Eosinophils Absolute: 0.4 10*3/uL (ref 0.0–0.7)
Hematocrit: 45 % (ref 36–46)
Hemoglobin: 14.7 g/dL (ref 13.5–17.5)
Lymphocytes %: 37 % (ref 20.5–51.1)
Lymphocytes Absolute: 2.8 10*3/uL (ref 1.0–4.8)
MCH: 26.5 PG — ABNORMAL LOW (ref 31–34)
MCHC: 32.6 g/dL (ref 31.0–36.0)
MCV: 81.4 FL (ref 80–100)
MPV: 9.3 FL (ref 6.5–11.5)
Monocytes %: 4 % (ref 1.7–9.3)
Monocytes Absolute: 0.3 10*3/uL (ref 0.2–2.4)
NRBC Absolute: 0.01 10*3/uL
Neutrophils %: 53 % (ref 42–75)
Neutrophils Absolute: 3.9 10*3/uL (ref 1.8–7.7)
Nucleated RBCs: 0.1 PER 100 WBC
Platelets: 175 10*3/uL (ref 150–400)
RBC: 5.53 M/uL (ref 4.50–5.90)
RDW: 15.2 % — ABNORMAL HIGH (ref 11.5–14.5)
WBC: 7.6 10*3/uL (ref 4.4–11.3)

## 2021-02-15 LAB — ACETAMINOPHEN LEVEL
Acetaminophen Level: <10 ug/mL — ABNORMAL LOW (ref 10–30)
DOSE AMOUNT: NOT DETECTED Units
DOSE DATE: NOT DETECTED

## 2021-02-15 LAB — URINALYSIS W/ RFLX MICROSCOPIC
Bilirubin, Urine: NEGATIVE
Bilirubin: NEGATIVE
Blood, Urine: NEGATIVE
Blood: NEGATIVE
Glucose, Ur: >1000 mg/dL — AB
Glucose: >1000 mg/dL — AB
Ketone: NEGATIVE mg/dL
Ketones, Urine: NEGATIVE mg/dL
Leukocyte Esterase, Urine: NEGATIVE
Leukocyte Esterase: NEGATIVE
Nitrite, Urine: NEGATIVE
Nitrites: NEGATIVE
Protein, UA: NEGATIVE mg/dL
Protein: NEGATIVE mg/dL
Specific Gravity, UA: 1.02 (ref 1.003–1.030)
Specific gravity: 1.02 (ref 1.003–1.030)
Urobilinogen, UA, POCT: 0.2 EU/dL (ref 0.2–1.0)
Urobilinogen: 0.2 EU/dL (ref 0.2–1.0)
pH (UA): 6 (ref 5.0–8.0)
pH, UA: 6 (ref 5.0–8.0)

## 2021-02-15 LAB — POCT GLUCOSE: POC Glucose: 110 mg/dL (ref 65–117)

## 2021-02-15 LAB — ETHYL ALCOHOL
ALCOHOL(ETHYL),SERUM: <4 mg/dL (ref <10)
Ethyl Alcohol: <4 mg/dL (ref <10)

## 2021-02-15 LAB — METABOLIC PANEL, COMPREHENSIVE
A-G Ratio: 1.1 (ref 1.1–2.2)
ALT (SGPT): 52 U/L (ref 12–78)
AST (SGOT): 53 U/L — ABNORMAL HIGH (ref 15–37)
Albumin: 3.6 g/dL (ref 3.5–5.0)
Alk. phosphatase: 98 U/L (ref 45–117)
Anion gap: 11 mmol/L (ref 5–15)
BUN/Creatinine ratio: 20 (ref 12–20)
BUN: 20 mg/dL (ref 6–20)
Bilirubin, total: 0.6 mg/dL (ref 0.2–1.0)
CO2: 25 mmol/L (ref 21–32)
Calcium: 9 mg/dL (ref 8.5–10.1)
Chloride: 100 mmol/L (ref 97–108)
Creatinine: 1.01 mg/dL (ref 0.55–1.02)
GFR est AA: >60 mL/min/{1.73_m2} (ref >60)
GFR est non-AA: 57 mL/min/{1.73_m2} — ABNORMAL LOW (ref >60)
Globulin: 3.4 g/dL (ref 2.0–4.0)
Glucose: 217 mg/dL — ABNORMAL HIGH (ref 65–100)
Potassium: 3.9 mmol/L (ref 3.5–5.1)
Protein, total: 7 g/dL (ref 6.4–8.2)
Sodium: 136 mmol/L (ref 136–145)

## 2021-02-15 LAB — CBC WITH AUTOMATED DIFF
ABS. BASOPHILS: 0.1 10*3/uL (ref 0.0–0.2)
ABS. EOSINOPHILS: 0.4 10*3/uL (ref 0.0–0.7)
ABS. LYMPHOCYTES: 2.8 10*3/uL (ref 1.0–4.8)
ABS. MONOCYTES: 0.3 10*3/uL (ref 0.2–2.4)
ABS. NEUTROPHILS: 3.9 10*3/uL (ref 1.8–7.7)
ABSOLUTE NRBC: 0.01 10*3/uL
BASOPHILS: 1 % (ref 0.0–2.5)
EOSINOPHILS: 5 % — ABNORMAL HIGH (ref 0.9–2.9)
HCT: 45 % (ref 36–46)
HGB: 14.7 g/dL (ref 13.5–17.5)
LYMPHOCYTES: 37 % (ref 20.5–51.1)
MCH: 26.5 PG — ABNORMAL LOW (ref 31–34)
MCHC: 32.6 g/dL (ref 31.0–36.0)
MCV: 81.4 FL (ref 80–100)
MONOCYTES: 4 % (ref 1.7–9.3)
MPV: 9.3 FL (ref 6.5–11.5)
NEUTROPHILS: 53 % (ref 42–75)
NRBC: 0.1 PER 100 WBC
PLATELET: 175 10*3/uL (ref 150–400)
RBC: 5.53 M/uL (ref 4.50–5.90)
RDW: 15.2 % — ABNORMAL HIGH (ref 11.5–14.5)
WBC: 7.6 10*3/uL (ref 4.4–11.3)

## 2021-02-15 LAB — GLUCOSE, POC: Glucose (POC): 110 mg/dL (ref 65–117)

## 2021-02-15 LAB — ACETAMINOPHEN
Acetaminophen level: <10 ug/mL — ABNORMAL LOW (ref 10–30)
Reported dose date: NOT DETECTED
Reported dose:: NOT DETECTED Units

## 2021-02-15 MED ORDER — SODIUM CHLORIDE 0.9% BOLUS IV
0.9 % | Freq: Once | INTRAVENOUS | Status: AC
Start: 2021-02-15 — End: 2021-02-15
  Administered 2021-02-15: 20:00:00 via INTRAVENOUS

## 2021-02-15 MED ORDER — SODIUM BICARBONATE 8.4 % IV
1 mEq/mL (8.4 %) | Freq: Once | INTRAVENOUS | Status: AC
Start: 2021-02-15 — End: 2021-02-15
  Administered 2021-02-15: 21:00:00 via INTRAVENOUS

## 2021-02-15 MED ORDER — CHLORHEXIDINE GLUCONATE 0.12 % MOUTHWASH
0.12 % | Freq: Two times a day (BID) | Status: DC
Start: 2021-02-15 — End: 2021-02-16
  Administered 2021-02-16 (×2): via ORAL

## 2021-02-15 MED ORDER — MAGNESIUM SULFATE 2 GRAM/50 ML IVPB
2 gram/50 mL (4 %) | Freq: Once | INTRAVENOUS | Status: AC
Start: 2021-02-15 — End: 2021-02-15
  Administered 2021-02-16: 02:00:00 via INTRAVENOUS

## 2021-02-15 MED ORDER — PROPOFOL INFUSION
10 mg/mL | INTRAVENOUS | Status: DC
Start: 2021-02-15 — End: 2021-02-16
  Administered 2021-02-16 (×11): via INTRAVENOUS

## 2021-02-15 MED ORDER — NOREPINEPHRINE BITARTRATE 1 MG/ML IV
1 mg/mL | INTRAVENOUS | Status: DC
Start: 2021-02-15 — End: 2021-02-15

## 2021-02-15 MED ORDER — NOREPINEPHRINE 8 MG/250 ML (32 MCG/ML) D5W INFUSION
8 mg/250 mL (32 mcg/mL) | INTRAVENOUS | Status: DC
Start: 2021-02-15 — End: 2021-02-18
  Administered 2021-02-16 (×10): via INTRAVENOUS

## 2021-02-15 MED ORDER — DEXTROSE 5% IN WATER (D5W) IV
1 mg/mL | INTRAVENOUS | Status: DC
Start: 2021-02-15 — End: 2021-02-15
  Administered 2021-02-15 (×5): via INTRAVENOUS

## 2021-02-15 MED ORDER — SODIUM CHLORIDE 0.9% BOLUS IV
0.9 % | Freq: Once | INTRAVENOUS | Status: AC
Start: 2021-02-15 — End: 2021-02-15
  Administered 2021-02-15: 21:00:00 via INTRAVENOUS

## 2021-02-15 MED FILL — CHLORHEXIDINE GLUCONATE 0.12 % MOUTHWASH: 0.12 % | Qty: 15

## 2021-02-15 MED FILL — NOREPINEPHRINE BITARTRATE 1 MG/ML IV: 1 mg/mL | INTRAVENOUS | Qty: 8

## 2021-02-15 NOTE — ED Notes (Signed)
1505--First Bolus of NS finished at this time--Pt still hypotensive at this time 70s/ 50s--Dr. Barth Kirks made aware--new bolus of Ns received at this time.     1508--New Bolus started of NS started at this time per Md orders.     1509--Bolus of NS put on a pressure bag at this time.

## 2021-02-15 NOTE — ED Notes (Signed)
Spoke with Jae Dire with [facility], recommends monitoring patient for 6 hours for seizure activity or ventricular dysrhythmias.  Also recommends administering activated charcoal (50 grams) within 1-2 hours of ingestion.  Also recommends benzos & fluid as needed for seizure like activity.  Physician made aware of these recommendations.  No further orders received.

## 2021-02-15 NOTE — ED Notes (Signed)
Per Shanda Bumps with [facility], Pt assigned bed ICU 15 at [facility].

## 2021-02-15 NOTE — ED Notes (Signed)
Patient's O2 sats reading at 91% on RA.  Patient placed on 3L NC.  Sats increased to 95-96%.

## 2021-02-15 NOTE — ED Notes (Signed)
Spoke with Cordelia Pen from poison control that request repeat EKG and labs to be faxed at [phone].

## 2021-02-15 NOTE — ED Notes (Signed)
Patient's blood pressure has continuously read in 80s/60s, 70s/50s.  Physician notified.  Verbal orders received to obtain IV access & administer a bolus of NS.  Patient also arousable to voice.

## 2021-02-15 NOTE — ED Notes (Signed)
Report provided to [facility] nurse at this time.  Printed encounter summary along with SBAR given to [facility] staff along with copies sent for facility.

## 2021-02-15 NOTE — ED Notes (Signed)
Spoke with Shanda Bumps from [facility]. [facility] is approx 45 minutes out. Security notified.

## 2021-02-15 NOTE — Procedures (Signed)
Intubation Note    Name:  Mallory Bonilla  Age:  55 y.o.  MRN:  [id number]  CSN:  [id number]  DOB:  10/05/1966    Referring physician: Thermon Leyland, DO     Called to bedside secondary to  airway compromise.      Patient pre-oxygenated with 100% oxygen.      Smooth RSI with Propofol 200 mg + Succinylcholine 100 mg IV.    VL w CMAC S3 Blade    7.5 ETT taped and secured at 21 cm at the teeth.    + Bilateral BS, + Chest rise, + ETCO2    VSS.  CXR pending.    Care turned over to covering Attending MD.    Loreli Dollar, DO

## 2021-02-15 NOTE — ED Notes (Signed)
Per [facility] request, labs and both EKGs sent.

## 2021-02-15 NOTE — ED Notes (Signed)
Patient moved to room 5 at this time for potential intubation.  Physician at bedside.

## 2021-02-15 NOTE — ED Notes (Signed)
Updated report given to Judeth Cornfield RN at [facility] ICU at this time.

## 2021-02-15 NOTE — Progress Notes (Signed)
 1900: Bedside and Verbal shift change report given to Elenore, Charity fundraiser (Cabin crew) by Corean, RN (offgoing nurse). Report included the following information SBAR, Kardex, ED Summary, Intake/Output, MAR, Recent Results and Cardiac Rhythm ST, SR.     2000: Assessment completed, see doc flow sheet. Pt turned and repositioned. Placed L-NGT marked @ 70 cm, note some blood in L-nare d/t trauma w/placement. STAT abdomen and chest xray ordered for ETT/NGT placement verification.    Neuro: bilateral pupils PERRL, 3 mm, cough w/suction and grimacing w/mouthcare.    Cardio: S1S2 present, no murmur present on auscultation, SR, ST    Respiratory: Intubated @1907 , #7.5 ETT, 21 @ the teeth, PRVC mode, TV 360, PEEP 6, FIO2 40, Rate 26, O2 sats @ 100. Bilateral lung sounds are coarse. Secretions are scant and bloody likely related to intubation trauma.    GI/GU: ABD is intact, obese, and soft, BS hypoactive x4, foley catheter in place draining clear yellow urine. Upon assessment, 350 ml in the bag was drained.    Skin: Intact. Scattered ecchymosis on anterior shins.     2135: Poison Control called and updated on pt's status/gtts. Will plan to call tomorrow unless we need them.    2200: VAP bundle completed. Mouth care done. Pt turned and repositioned. Preventative foam dressing placed on sacrum.    0000: Nurse asked R.T about ABG post-intubation results. She stated that the patient's information wasn't in our system so only a print out was recorded w/results. Her manager will have to input them into our system tomorrow morning. Results are as follows: ABG pre-intubation: pH 7.297, CO2 53.7, O2 180.6, HCO3 26.3 Assessment completed, see doc flow sheet. Pt turned and repositioned. VAP bundle completed. Mouth care done.    0200: Pt turned and repositioned. VAP bundle completed. Mouth care done.    0400: Assessment completed, see doc flow sheet. Pt turned and repositioned. VAP bundle completed. Mouth care done.    23: Nurse went into  pt's room to assess Propofol  line d/t pump alarming occulded. Nurse felt resistance when attempting to flush line. Pt's head was moved to the left and nurse re-attempted to flush line. Line flushed successfully. Pt opened her eyes to voice, follows some command to open eyes but will not keep them open, pt will not track w/eyes.    0440: New ABG results, MD notified. New order to STOP Bicarb gtt.    0600: Pt turned and repositioned. VAP bundle completed. Mouth care complete.     0700: Bedside and Verbal shift change report given to Jenn, Charity fundraiser (Cabin crew) by Elenore, RN (offgoing nurse). Report included the following information SBAR, Kardex, ED Summary, Intake/Output, MAR, Recent Results and Cardiac Rhythm SR, ST.

## 2021-02-15 NOTE — ED Provider Notes (Signed)
HPI   Patient has long history of anxiety and depression.  I saw her several days ago for this.  She was in the emergency department for several days awaiting psychiatric placement and declared that she no longer had thoughts of harming herself and that her children were too important, and she was discharged.  Today, patient returns reporting that she took an overdose of home medications in an attempt to harm her self, but then again changed her mind to her children and presented here.  She reports taking clonazepam, doxepin, and hydroxyzine.  Denies Tylenol, alcohol, opiates, and volatile compounds.  She has no medical complaints.  Past Medical History:   Diagnosis Date   ??? Anxiety 01/11/2021   ??? Arthritis    ??? Bipolar 1 disorder (HCC) 01/11/2021   ??? Diabetes (HCC)    ??? Diabetes mellitus type 2, controlled (HCC) 01/11/2021   ??? Headache    ??? Headache 01/11/2021   ??? Hypercholesterolemia    ??? Hyperlipidemia 01/11/2021   ??? Hypertension    ??? Hypertension 01/11/2021       Past Surgical History:   Procedure Laterality Date   ??? HX OOPHORECTOMY           Family History:   Problem Relation Age of Onset   ??? Diabetes Mother    ??? Cancer Mother    ??? Diabetes Sister    ??? Heart Disease Sister    ??? Cancer Sister    ??? Diabetes Brother    ??? Heart Disease Brother        Social History     Socioeconomic History   ??? Marital status: DIVORCED     Spouse name: Not on file   ??? Number of children: Not on file   ??? Years of education: Not on file   ??? Highest education level: Not on file   Occupational History   ??? Not on file   Tobacco Use   ??? Smoking status: Never Smoker   ??? Smokeless tobacco: Never Used   Substance and Sexual Activity   ??? Alcohol use: Not Currently   ??? Drug use: Not Currently   ??? Sexual activity: Yes   Other Topics Concern   ??? Not on file   Social History Narrative   ??? Not on file     Social Determinants of Health     Financial Resource Strain:    ??? Difficulty of Paying Living Expenses: Not on file   Food Insecurity:    ??? Worried  About Running Out of Food in the Last Year: Not on file   ??? Ran Out of Food in the Last Year: Not on file   Transportation Needs:    ??? Lack of Transportation (Medical): Not on file   ??? Lack of Transportation (Non-Medical): Not on file   Physical Activity:    ??? Days of Exercise per Week: Not on file   ??? Minutes of Exercise per Session: Not on file   Stress:    ??? Feeling of Stress : Not on file   Social Connections:    ??? Frequency of Communication with Friends and Family: Not on file   ??? Frequency of Social Gatherings with Friends and Family: Not on file   ??? Attends Religious Services: Not on file   ??? Active Member of Clubs or Organizations: Not on file   ??? Attends Banker Meetings: Not on file   ??? Marital Status: Not on file   Intimate Partner  Violence:    ??? Fear of Current or Ex-Partner: Not on file   ??? Emotionally Abused: Not on file   ??? Physically Abused: Not on file   ??? Sexually Abused: Not on file   Housing Stability:    ??? Unable to Pay for Housing in the Last Year: Not on file   ??? Number of Places Lived in the Last Year: Not on file   ??? Unstable Housing in the Last Year: Not on file         ALLERGIES: Patient has no known allergies.    Review of Systems   Constitutional: Negative.    HENT: Negative.    Eyes: Negative.    Respiratory: Negative.    Cardiovascular: Negative.    Gastrointestinal: Negative.    Endocrine: Negative.    Genitourinary: Negative.    Musculoskeletal: Negative.    Skin: Negative.    Allergic/Immunologic: Negative.    Neurological: Negative.    Hematological: Negative.    Psychiatric/Behavioral: Positive for confusion, decreased concentration, dysphoric mood, self-injury, sleep disturbance and suicidal ideas. [name] and is hyperactive.    All other systems reviewed and are negative.      Vitals:    02/15/21 1314   BP: 125/72   Pulse: 88   Resp: 18   SpO2: 96%            Physical Exam  Vitals and nursing note reviewed.   Constitutional:       General: She  is not in acute distress.     Appearance: Normal appearance. She is normal weight. She is not ill-appearing, toxic-appearing or diaphoretic.   HENT:      Head: Normocephalic and atraumatic.      Nose: Nose normal.      Mouth/Throat:      Mouth: Mucous membranes are moist.      Pharynx: Oropharynx is clear.   Eyes:      Extraocular Movements: Extraocular movements intact.      Conjunctiva/sclera: Conjunctivae normal.      Pupils: Pupils are equal, round, and reactive to light.   Cardiovascular:      Rate and Rhythm: Normal rate and regular rhythm.      Pulses: Normal pulses.      Heart sounds: Normal heart sounds. No murmur heard.  No friction rub. No gallop.    Pulmonary:      Effort: Pulmonary effort is normal. No respiratory distress.      Breath sounds: Normal breath sounds. No stridor. No wheezing, rhonchi or rales.   Chest:      Chest wall: No tenderness.   Musculoskeletal:         General: No swelling, tenderness, deformity or signs of injury. Normal range of motion.      Cervical back: Normal range of motion.      Right lower leg: No edema.      Left lower leg: No edema.   Skin:     General: Skin is warm and dry.      Coloration: Skin is not jaundiced or pale.      Findings: No bruising, erythema, lesion or rash.   Neurological:      General: No focal deficit present.      Mental Status: She is alert and oriented to person, place, and time. Mental status is at baseline.      Cranial Nerves: No cranial nerve deficit.      Sensory: No sensory deficit.  Motor: No weakness.      Coordination: Coordination normal.      Gait: Gait normal.   Psychiatric:      Comments: Flat mood and affect.  No overt signs of psychosis.  Poor judgment and insight.          MDM      Unable to determine actual dosages of OD.  Of concern is TCA and benzo.  Poison control recommends monitor for six hours. Initial EKG with normal intervals.    Pt has become hypotensive with decreased responsiveness. Rx IVF and monitor for prolonged  QTc, may need pressors, bicarb. Will transfer for ICU care.     No ICU beds in [facility] market. Starting pressors for persistent hypotension.    Central Line    Date/Time: 02/15/2021 4:16 PM  Performed by: Yetta Numbers, MD  Authorized by: Yetta Numbers, MD     Consent:     Consent obtained:  Emergent situation    Alternatives discussed:  Alternative treatment  Pre-procedure details:     Sterile barrier technique: All elements of maximal sterile technique followed      Skin preparation:  2% chlorhexidine    Skin preparation agent: Skin preparation agent completely dried prior to procedure    Anesthesia (see MAR for exact dosages):     Anesthesia method:  None  Procedure details:     Location:  R internal jugular    Site selection rationale:  Vein visualized on [place]    Patient position:  Flat    Procedural supplies:  Triple lumen    Catheter size:  7 Fr    Landmarks identified: yes      Ultrasound guidance: yes      Sterile ultrasound techniques: Sterile gel and sterile probe covers were used      Number of attempts:  1    Successful placement: yes    Post-procedure details:     Post-procedure:  Dressing applied and line sutured    Assessment:  Blood return through all ports, free fluid flow, no pneumothorax on x-ray and placement verified by x-ray    Patient tolerance of procedure:  Tolerated well, no immediate complications  Critical Care  Performed by: Yetta Numbers, MD  Authorized by: Yetta Numbers, MD     Critical care provider statement:     Critical care time (minutes):  60    Critical care time was exclusive of:  Separately billable procedures and treating other patients    Critical care was necessary to treat or prevent imminent or life-threatening deterioration of the following conditions:  Circulatory failure, CNS failure or compromise and toxidrome    Critical care was time spent personally by me on the following activities:  Blood draw for specimens, development of treatment plan with patient or  surrogate, discussions with consultants, discussions with primary provider, evaluation of patient's response to treatment, examination of patient, interpretation of cardiac output measurements, obtaining history from patient or surrogate, ordering and performing treatments and interventions, ordering and review of laboratory studies, ordering and review of radiographic studies, pulse oximetry, re-evaluation of patient's condition, review of old charts and vascular access procedures    I assumed direction of critical care for this patient from another provider in my specialty: no

## 2021-02-15 NOTE — ED Notes (Signed)
Patient placed on warming blanket at this time.

## 2021-02-15 NOTE — H&P (Signed)
[place] [facility]  [address] [place], [place] [address]  [phone]    Admission History and Physical      NAME:  Mallory Bonilla   DOB:   09-12-1966   MRN:  [id number]     PCP:  Mallory Hartshorn, NP     Date/Time of service:  02/15/2021  5:53 PM        Subjective:     CHIEF COMPLAINT: suicide attempt    HISTORY OF PRESENT ILLNESS:     [name] is a 55 y.o.  female who is admitted from [facility] ER to ICU for intentional drug overdose. She has history of suicide attempt and was in their ER for days prior to this admission awaiting psych placement however voiced that she no longer planned to harm herself and was discharged home. Per ER notes, she overdosed on klonopin, doxepin, hydroxyzine. Poison control was notified and recommended monitoring for 6 hours, labs, and EKG for QT eval. She was hypotensive refractory to IVF and started on levo.  Chart review reveals she follows with [facility] and takes abilify 5mg , viibryd 40mg , clonazepam 0.5mg  bid, buspar 10mg  daily. She was diagnosed with covid recently as well.    Patient unresponsive, snoring at time of evaluation. History obtained from chart, ER provider.    No Known Allergies    Prior to Admission medications    Medication Sig Start Date End Date Taking? Authorizing Provider   ARIPiprazole (ABILIFY) 5 mg tablet Take 1 Tablet by mouth nightly for 90 days. 02/12/21 05/13/21  Mallory Bonilla, , NP   clonazePAM (KlonoPIN) 0.5 mg tablet TAKE 1 TABLET BY MOUTH TWICE DAILY AS NEEDED FOR ANXIETY . DO NOT EXCEED 2 PER 24 HOURS 02/09/21   Mallory Bonilla, 05/15/21, NP   busPIRone (BUSPAR) 10 mg tablet Take 1 Tablet by mouth daily for 90 days. 01/16/21 04/16/21  Mallory Land, NP   Viibryd 40 mg tab tablet Take 1 tablet by mouth once daily 12/04/20   04/18/21, NP   topiramate (Topamax) 50 mg tablet Take  by mouth nightly.    Other, Phys, MD   pravastatin (PRAVACHOL) 40 mg tablet Take 40 mg by mouth  nightly.    Other, Phys, MD   canagliflozin-metFORMIN (Invokamet) 150-500 mg tab Take  by mouth two (2) times a day.    Other, Phys, MD   hydroCHLOROthiazide 25 mg tab 25 mg, lisinopriL 20 mg tab 20 mg Take  by mouth daily.    Other, Phys, MD   metoprolol tartrate (LOPRESSOR) 25 mg tablet Take  by mouth two (2) times a day.    Other, Phys, MD   estradioL (ESTRACE) 2 mg tablet Take  by mouth daily.    Other, Phys, MD   rizatriptan (MAXALT) 5 mg tablet Take 5 mg by mouth once as needed for Migraine. May repeat in 2 hours if needed    Other, Phys, MD   dulaglutide (Trulicity) 0.75 mg/0.5 mL sub-q pen 0.75 mg by SubCUTAneous route every seven (7) days. Takes on Mondays    Other, Phys, MD   aspirin delayed-release 81 mg tablet Take 81 mg by mouth daily.    Other, Phys, MD   naproxen sodium (Aleve) 220 mg tablet Take 220 mg by mouth two (2) times daily as needed for Pain.    Other, Phys, MD       Past Medical History:   Diagnosis Date   ??? Anxiety 01/11/2021   ???  Arthritis    ??? Bipolar 1 disorder (HCC) 01/11/2021   ??? Diabetes (HCC)    ??? Diabetes mellitus type 2, controlled (HCC) 01/11/2021   ??? Headache    ??? Headache 01/11/2021   ??? Hypercholesterolemia    ??? Hyperlipidemia 01/11/2021   ??? Hypertension    ??? Hypertension 01/11/2021        Past Surgical History:   Procedure Laterality Date   ??? HX OOPHORECTOMY         Social History     Tobacco Use   ??? Smoking status: Never Smoker   ??? Smokeless tobacco: Never Used   Substance Use Topics   ??? Alcohol use: Not Currently        Family History   Problem Relation Age of Onset   ??? Diabetes Mother    ??? Cancer Mother    ??? Diabetes Sister    ??? Heart Disease Sister    ??? Cancer Sister    ??? Diabetes Brother    ??? Heart Disease Brother         Review of Systems:  Unable to obtain secondary to unresponsive       Objective:      VITALS:    Vital signs reviewed; most recent are:    There were no vitals taken for this visit.  SpO2 Readings from Last 6 Encounters:   02/15/21 98%   02/10/21 99%   07/03/20  94%        No intake or output data in the 24 hours ending 02/15/21 1753     Exam:     Physical Exam:    Gen:  Well-developed, well-nourished, in no acute distress; sedated  HEENT:  Eyes closed, moist mucous membranes, snoring  Neck:  Supple, without masses, thyroid non-tender  Resp:  No accessory muscle use, clear breath sounds without wheezes rales or rhonchi  Card:  No murmurs, normal S1, S2 without thrills, bruits or peripheral edema  Abd:  Soft, non-tender, non-distended, normoactive bowel sounds are present, no palpable organomegaly and no detectable hernias  Lymph:  No cervical adenopathy  Musc:  No cyanosis or clubbing  Skin:  No rashes or ulcers, skin turgor is good  Neuro:  Nonfocal, sedated  Psych:  sedated    Labs:    Recent Labs     02/15/21  1326   WBC 7.6   HGB 14.7   HCT 45.0   PLT 175     Recent Labs     02/15/21  1326   NA 136   K 3.9   CL 100   CO2 25   GLU 217*   BUN 20   CREA 1.01   CA 9.0   ALB 3.6   TBILI 0.6   ALT 52     Lab Results   Component Value Date/Time    Glucose (POC) 110 02/15/2021 04:10 PM    Glucose (POC) 157 (H) 02/10/2021 09:46 AM        Assessment/Plan:       1. Intentional drug overdose in setting of severe depression - psych c/s, poison control called from [facility] ER, rec monitor for 6 hrs; initial EKG with normal qt but repeat qtc up to 561 prior to transfer, repeat EKG on arrival here with qtc 505; check mag, give 2g mag, for bicarb 1 amp pushes until qrs  <120, EKG on arrival here with qrs 90; case d/w ICU, will reach out to anesthesia re: intubation for airway protection  2. Bipolar 1 disorder - psych c/s  3. Hypotension - suspect sec to med effect, on pressors  4. Recent covid 19 detected - 2/12; repeat 2/17 negative  5. HTN  6. Obesity - BMI 31.62  7. DM2 - ssi  8. Migraines - cont home regimen  9. VTE prophylaxis - lovenox  10. Dispo - admit to ICU    40 minutes critical care time.    Attending Physician: Mallory Leyland, DO

## 2021-02-15 NOTE — Progress Notes (Signed)
Mallory Bonilla was intubated this evening for airway protection after on overdose. No adverse events, pt intubated with CMAC, 7.5 ETT, 21 @ teeth. Current settings PRVC 40% 26/360/+6.     Pt has clear bilateral breath sounds, no secretions, weak cough at this time.     Marchelle Folks RRT

## 2021-02-15 NOTE — Behavioral Health Treatment Team (Signed)
Reviewed clinical information for possible admission to [facility].  Awaiting COVID results, UDS, etc.  Supervisor aware.

## 2021-02-15 NOTE — ED Notes (Signed)
Pts doxepin, Hydroxyzine, clonzepam--unknown amount of each drug-approx 30 min ago.  Pt reports she wrote a suicide note--- and took medication with intent to harm herself.

## 2021-02-15 NOTE — ED Notes (Signed)
ETA for [facility] is 40 minutes.

## 2021-02-15 NOTE — ED Notes (Signed)
Patient placed into patient gown with cardiac, NIBP, & pulse ox monitoring at this time.  Belongings to include clothing, purse, phone, & shoes given to security at this time.

## 2021-02-15 NOTE — ED Notes (Signed)
Report called to Judeth Cornfield RN at [facility] ICU at this time.

## 2021-02-15 NOTE — Consults (Signed)
Consult Date: 02/15/2021    Consults Consulted by Dr. Jeanella Anton for assistance with ICU mgmt of OD.      Subjective    55 y/o with pmh bipolar, depression, HTN, HLD, DM admitted after transfer from outside ER for mgmt of OD.  Per report pt was in ED for several days prior to being released after stating she no longer had SI.  Returned 2/17 after OD clonazepam, doxepin, hydroxyzine.      Transferred to [facility] for further mgmt.      Past Medical History:   Diagnosis Date   ??? Anxiety 01/11/2021   ??? Arthritis    ??? Bipolar 1 disorder ([place]) 01/11/2021   ??? Diabetes ([place])    ??? Diabetes mellitus type 2, controlled ([place]) 01/11/2021   ??? Headache    ??? Headache 01/11/2021   ??? Hypercholesterolemia    ??? Hyperlipidemia 01/11/2021   ??? Hypertension    ??? Hypertension 01/11/2021      Past Surgical History:   Procedure Laterality Date   ??? HX OOPHORECTOMY       Family History   Problem Relation Age of Onset   ??? Diabetes Mother    ??? Cancer Mother    ??? Diabetes Sister    ??? Heart Disease Sister    ??? Cancer Sister    ??? Diabetes Brother    ??? Heart Disease Brother       Social History     Tobacco Use   ??? Smoking status: Never Smoker   ??? Smokeless tobacco: Never Used   Substance Use Topics   ??? Alcohol use: Not Currently            Review of Systems  Unable to obtain due to encephalopathy  Objective   Gen: somnolent, but awakens  HEENT: NCAT  Chest: symmetrical chest rise  CV: r/r/r  Abd: non-distended  Ext: no c/c/e  Neuro: not moving ext or following commands at time of my eval.      Vital signs for last 24 hours:  Visit Vitals  BP 132/66   Pulse (!) 105   Temp 98 ??F (36.7 ??C)   Resp 14   SpO2 100%       Intake/Output this shift:  Current Shift: No intake/output data recorded.  Last 3 Shifts: No intake/output data recorded.    Data Review:   Recent Results (from the past 24 hour(s))   EKG, 12 LEAD, INITIAL    Collection Time: 02/15/21  1:21 PM   Result Value Ref Range    Ventricular Rate 88 BPM    Atrial Rate 88 BPM    P-R Interval 145 ms    QRS  Duration 101 ms    Q-T Interval 378 ms    QTC Calculation (Bezet) 458 ms    Calculated P Axis 42 degrees    Calculated R Axis 41 degrees    Calculated T Axis 63 degrees    Diagnosis Sinus rhythm  Low voltage, precordial leads      ACETAMINOPHEN    Collection Time: 02/15/21  1:26 PM   Result Value Ref Range    Acetaminophen level <10 (L) 10 - 30 ug/mL    Reported dose date Not Detected      Reported dose: Not Detected Units   CBC WITH AUTOMATED DIFF    Collection Time: 02/15/21  1:26 PM   Result Value Ref Range    WBC 7.6 4.4 - 11.3 K/uL    RBC 5.53 4.50 -  5.90 M/uL    HGB 14.7 13.5 - 17.5 g/dL    HCT 45.0 36 - 46 %    MCV 81.4 80 - 100 FL    MCH 26.5 (L) 31 - 34 PG    MCHC 32.6 31.0 - 36.0 g/dL    RDW 15.2 (H) 11.5 - 14.5 %    PLATELET 175 150 - 400 K/uL    MPV 9.3 6.5 - 11.5 FL    NRBC 0.1 PER 100 WBC    ABSOLUTE NRBC 0.01 K/uL    NEUTROPHILS 53 42 - 75 %    LYMPHOCYTES 37 20.5 - 51.1 %    MONOCYTES 4 1.7 - 9.3 %    EOSINOPHILS 5 (H) 0.9 - 2.9 %    BASOPHILS 1 0.0 - 2.5 %    ABS. NEUTROPHILS 3.9 1.8 - 7.7 K/UL    ABS. LYMPHOCYTES 2.8 1.0 - 4.8 K/UL    ABS. MONOCYTES 0.3 0.2 - 2.4 K/UL    ABS. EOSINOPHILS 0.4 0.0 - 0.7 K/UL    ABS. BASOPHILS 0.1 0.0 - 0.2 K/UL   ETHYL ALCOHOL    Collection Time: 02/15/21  1:26 PM   Result Value Ref Range    ALCOHOL(ETHYL),SERUM <4 <10 mg/dL   URINALYSIS W/ RFLX MICROSCOPIC    Collection Time: 02/15/21  1:26 PM   Result Value Ref Range    Color Yellow/Straw      Appearance Clear Clear      Specific gravity 1.020 1.003 - 1.030      pH (UA) 6.0 5.0 - 8.0      Protein Negative Negative mg/dL    Glucose >1,000 (A) Negative mg/dL    Ketone Negative Negative mg/dL    Bilirubin Negative Negative      Blood Negative Negative      Urobilinogen 0.2 0.2 - 1.0 EU/dL    Nitrites Negative Negative      Leukocyte Esterase Negative Negative     METABOLIC PANEL, COMPREHENSIVE    Collection Time: 02/15/21  1:26 PM   Result Value Ref Range    Sodium 136 136 - 145 mmol/L    Potassium 3.9 3.5 - 5.1  mmol/L    Chloride 100 97 - 108 mmol/L    CO2 25 21 - 32 mmol/L    Anion gap 11 5 - 15 mmol/L    Glucose 217 (H) 65 - 100 mg/dL    BUN 20 6 - 20 mg/dL    Creatinine 1.01 0.55 - 1.02 mg/dL    BUN/Creatinine ratio 20 12 - 20      GFR est AA >60 >60 ml/min/1.64m    GFR est non-AA 57 (L) >60 ml/min/1.791m   Calcium 9.0 8.5 - 10.1 mg/dL    Bilirubin, total 0.6 0.2 - 1.0 mg/dL    AST (SGOT) 53 (H) 15 - 37 U/L    ALT (SGPT) 52 12 - 78 U/L    Alk. phosphatase 98 45 - 117 U/L    Protein, total 7.0 6.4 - 8.2 g/dL    Albumin 3.6 3.5 - 5.0 g/dL    Globulin 3.4 2.0 - 4.0 g/dL    A-G Ratio 1.1 1.1 - 2.2     COVID-19 WITH INFLUENZA A/B    Collection Time: 02/15/21  1:26 PM   Result Value Ref Range    SARS-CoV-2 Not Detected Not Detected      Influenza A by PCR Not Detected Not Detected      Influenza B by PCR  Not Detected Not Detected     DRUG SCREEN, URINE    Collection Time: 02/15/21  1:30 PM   Result Value Ref Range    AMPHETAMINES Negative Negative      BARBITURATES Negative Negative      BENZODIAZEPINES Negative Negative      COCAINE Negative Negative      ECSTASY, MDMA Negative Negative      METHADONE Negative Negative      OPIATES Negative Negative      PCP(PHENCYCLIDINE) Negative Negative      THC (TH-CANNABINOL) Negative Negative      Drug screen comment 5.0     EKG, 12 LEAD, INITIAL    Collection Time: 02/15/21  3:44 PM   Result Value Ref Range    Ventricular Rate 94 BPM    Atrial Rate 94 BPM    P-R Interval 192 ms    QRS Duration 115 ms    Q-T Interval 448 ms    QTC Calculation (Bezet) 561 ms    Calculated P Axis 35 degrees    Calculated R Axis 50 degrees    Calculated T Axis 60 degrees    Diagnosis       Sinus rhythm  Nonspecific intraventricular conduction delay  Low voltage, precordial leads  Borderline T abnormalities, lateral leads     GLUCOSE, POC    Collection Time: 02/15/21  4:10 PM   Result Value Ref Range    Glucose (POC) 110 65 - 117 mg/dL    Performed by Robie Ridge      55 y/o with hx HTN, HLD, DM,  depression, bipolar admitted after transfer from outside ER for mgmt of polysubs OD    Polysubstance OD:  -- Poison control involved at outside hospital  -- continue supportive care  -- monitor QTc and consider bicarb if becoming prolonged.    -- amps of bicarb for QRS >120 and Mg for QT >500    Hypotension:  -- secondary to polysubs OD  -- pressors as needed for MAP goal >65    Encephalopathy:  -- acute tox  -- secondary to above  -- no indication of CNS infection or event.    -- no gag currently given level of unresponsiveness.  Will intubate for airway protection    SI:  -- psych consult when extubated  -- suicide precautions.     CCM time 35 min.  Pt at risk of life threatening deterioration from polysubstance OD.      Pt seen and evaluated via tele interaction.  Audio and video used for this encounter.

## 2021-02-15 NOTE — ED Notes (Signed)
Physician reviewed CXR, & pulled back central line approximately 4cm & gave directions to this RN to begin norepinephrine drip without repeat CXR to confirm placement.

## 2021-02-16 LAB — POCT GLUCOSE
POC Glucose: 117 mg/dL (ref 65–117)
POC Glucose: 142 mg/dL — ABNORMAL HIGH (ref 65–117)
POC Glucose: 134 mg/dL — ABNORMAL HIGH (ref 65–117)
POC Glucose: 131 mg/dL — ABNORMAL HIGH (ref 65–117)

## 2021-02-16 LAB — POC G3 - PUL
Allens test (POC): POSITIVE
Allens test (POC): POSITIVE
Base Deficit: 0.9 mmol/L
Base Excess, Arterial: 0.5 mmol/L
Base deficit (POC): 0.9 mmol/L
Base excess (POC): 0.5 mmol/L
FIO2 (POC): 40 %
FIO2: 40 %
HCO3 (POC): 21.8 MMOL/L — ABNORMAL LOW (ref 22–26)
HCO3 (POC): 26.3 MMOL/L — ABNORMAL HIGH (ref 22–26)
HCO3, Art: 21.8 MMOL/L — ABNORMAL LOW (ref 22–26)
HCO3, Art: 26.3 MMOL/L — ABNORMAL HIGH (ref 22–26)
PEEP/CPAP (POC): 6 cmH2O
POC Allen's Test: POSITIVE
POC Allen's Test: POSITIVE
POC O2 SAT: 98.9 % — ABNORMAL HIGH (ref 92–97)
POC O2 SAT: 99.4 % — ABNORMAL HIGH (ref 92–97)
Peep/Cpap: 6 cmH2O
Rate: 26 {beats}/min
Set Rate: 26 {beats}/min
Tidal Volume: 360 ml
Tidal volume: 360 ml
pCO2 (POC): 25.4 MMHG — ABNORMAL LOW (ref 35.0–45.0)
pCO2 (POC): 53.7 MMHG — ABNORMAL HIGH (ref 35.0–45.0)
pCO2, Art: 25.4 MMHG — ABNORMAL LOW (ref 35.0–45.0)
pCO2, Art: 53.7 MMHG — ABNORMAL HIGH (ref 35.0–45.0)
pH (POC): 7.54 — ABNORMAL HIGH (ref 7.35–7.45)
pH (POC): 7.3 — ABNORMAL LOW (ref 7.35–7.45)
pH, Art: 7.54 — ABNORMAL HIGH (ref 7.35–7.45)
pH, Art: 7.3 — ABNORMAL LOW (ref 7.35–7.45)
pO2 (POC): 108 MMHG — ABNORMAL HIGH (ref 80–100)
pO2 (POC): 181 MMHG — ABNORMAL HIGH (ref 80–100)
pO2, Art: 108 MMHG — ABNORMAL HIGH (ref 80–100)
pO2, Art: 181 MMHG — ABNORMAL HIGH (ref 80–100)
sO2 (POC): 98.9 % — ABNORMAL HIGH (ref 92–97)
sO2 (POC): 99.4 % — ABNORMAL HIGH (ref 92–97)

## 2021-02-16 LAB — CBC
Hematocrit: 37.6 % (ref 35.0–47.0)
Hemoglobin: 12.1 g/dL (ref 11.5–16.0)
MCH: 26.5 PG (ref 26.0–34.0)
MCHC: 32.2 g/dL (ref 30.0–36.5)
MCV: 82.3 FL (ref 80.0–99.0)
MPV: 10.9 FL (ref 8.9–12.9)
NRBC Absolute: 0 10*3/uL (ref 0.00–0.01)
Nucleated RBCs: 0 PER 100 WBC
Platelets: 159 10*3/uL (ref 150–400)
RBC: 4.57 M/uL (ref 3.80–5.20)
RDW: 14.3 % (ref 11.5–14.5)
WBC: 7.2 10*3/uL (ref 3.6–11.0)

## 2021-02-16 LAB — COMPREHENSIVE METABOLIC PANEL
ALT: 37 U/L (ref 12–78)
AST: 28 U/L (ref 15–37)
Albumin/Globulin Ratio: 1.1 (ref 1.1–2.2)
Albumin: 2.9 g/dL — ABNORMAL LOW (ref 3.5–5.0)
Alkaline Phosphatase: 80 U/L (ref 45–117)
Anion Gap: 9 mmol/L (ref 5–15)
BUN: 13 MG/DL (ref 6–20)
Bun/Cre Ratio: 16 (ref 12–20)
CO2: 23 mmol/L (ref 21–32)
Calcium: 7.8 MG/DL — ABNORMAL LOW (ref 8.5–10.1)
Chloride: 111 mmol/L — ABNORMAL HIGH (ref 97–108)
Creatinine: 0.8 MG/DL (ref 0.55–1.02)
EGFR IF NonAfrican American: >60 mL/min/{1.73_m2} (ref >60)
GFR African American: >60 mL/min/{1.73_m2} (ref >60)
Globulin: 2.7 g/dL (ref 2.0–4.0)
Glucose: 147 mg/dL — ABNORMAL HIGH (ref 65–100)
Potassium: 3.2 mmol/L — ABNORMAL LOW (ref 3.5–5.1)
Sodium: 143 mmol/L (ref 136–145)
Total Bilirubin: 0.4 MG/DL (ref 0.2–1.0)
Total Protein: 5.6 g/dL — ABNORMAL LOW (ref 6.4–8.2)

## 2021-02-16 LAB — CULTURE, URINE
Culture result:: NO GROWTH
Culture: NO GROWTH

## 2021-02-16 LAB — EKG 12-LEAD
Atrial Rate: 116 {beats}/min
P Axis: 36 degrees
P-R Interval: 160 ms
Q-T Interval: 364 ms
QRS Duration: 90 ms
QTc Calculation (Bazett): 505 ms
R Axis: 35 degrees
T Axis: 49 degrees
Ventricular Rate: 116 {beats}/min

## 2021-02-16 LAB — SALICYLATE
Salicylate level: <1.7 MG/DL — ABNORMAL LOW (ref 2.8–20.0)
Salicylate: <1.7 MG/DL — ABNORMAL LOW (ref 2.8–20.0)

## 2021-02-16 LAB — HEMOGLOBIN A1C W/EAG
Hemoglobin A1C: 6.2 % — ABNORMAL HIGH (ref 4.0–5.6)
eAG: 131 mg/dL

## 2021-02-16 LAB — COVID-19: SARS-CoV-2: POSITIVE — AB

## 2021-02-16 LAB — MAGNESIUM
Magnesium: 2 mg/dL (ref 1.6–2.4)
Magnesium: 2 mg/dL (ref 1.6–2.4)

## 2021-02-16 LAB — CBC W/O DIFF
ABSOLUTE NRBC: 0 10*3/uL (ref 0.00–0.01)
HCT: 37.6 % (ref 35.0–47.0)
HGB: 12.1 g/dL (ref 11.5–16.0)
MCH: 26.5 PG (ref 26.0–34.0)
MCHC: 32.2 g/dL (ref 30.0–36.5)
MCV: 82.3 FL (ref 80.0–99.0)
MPV: 10.9 FL (ref 8.9–12.9)
NRBC: 0 PER 100 WBC
PLATELET: 159 10*3/uL (ref 150–400)
RBC: 4.57 M/uL (ref 3.80–5.20)
RDW: 14.3 % (ref 11.5–14.5)
WBC: 7.2 10*3/uL (ref 3.6–11.0)

## 2021-02-16 LAB — GLUCOSE, POC
Glucose (POC): 117 mg/dL (ref 65–117)
Glucose (POC): 142 mg/dL — ABNORMAL HIGH (ref 65–117)
Glucose (POC): 134 mg/dL — ABNORMAL HIGH (ref 65–117)
Glucose (POC): 131 mg/dL — ABNORMAL HIGH (ref 65–117)

## 2021-02-16 LAB — METABOLIC PANEL, COMPREHENSIVE
A-G Ratio: 1.1 (ref 1.1–2.2)
ALT (SGPT): 37 U/L (ref 12–78)
AST (SGOT): 28 U/L (ref 15–37)
Albumin: 2.9 g/dL — ABNORMAL LOW (ref 3.5–5.0)
Alk. phosphatase: 80 U/L (ref 45–117)
Anion gap: 9 mmol/L (ref 5–15)
BUN/Creatinine ratio: 16 (ref 12–20)
BUN: 13 MG/DL (ref 6–20)
Bilirubin, total: 0.4 MG/DL (ref 0.2–1.0)
CO2: 23 mmol/L (ref 21–32)
Calcium: 7.8 MG/DL — ABNORMAL LOW (ref 8.5–10.1)
Chloride: 111 mmol/L — ABNORMAL HIGH (ref 97–108)
Creatinine: 0.8 MG/DL (ref 0.55–1.02)
GFR est AA: >60 mL/min/{1.73_m2} (ref >60)
GFR est non-AA: >60 mL/min/{1.73_m2} (ref >60)
Globulin: 2.7 g/dL (ref 2.0–4.0)
Glucose: 147 mg/dL — ABNORMAL HIGH (ref 65–100)
Potassium: 3.2 mmol/L — ABNORMAL LOW (ref 3.5–5.1)
Protein, total: 5.6 g/dL — ABNORMAL LOW (ref 6.4–8.2)
Sodium: 143 mmol/L (ref 136–145)

## 2021-02-16 LAB — EKG, 12 LEAD, INITIAL
Atrial Rate: 116 {beats}/min
Calculated P Axis: 36 degrees
Calculated R Axis: 35 degrees
Calculated T Axis: 49 degrees
P-R Interval: 160 ms
Q-T Interval: 364 ms
QRS Duration: 90 ms
QTC Calculation (Bezet): 505 ms
Ventricular Rate: 116 {beats}/min

## 2021-02-16 LAB — SARS-COV-2: SARS-CoV-2: POSITIVE — AB

## 2021-02-16 LAB — HEMOGLOBIN A1C WITH EAG
Est. average glucose: 131 mg/dL
Hemoglobin A1c: 6.2 % — ABNORMAL HIGH (ref 4.0–5.6)

## 2021-02-16 MED ORDER — PRAVASTATIN 20 MG TAB
20 mg | Freq: Every evening | ORAL | Status: DC
Start: 2021-02-16 — End: 2021-02-20
  Administered 2021-02-17 – 2021-02-20 (×4): via ORAL

## 2021-02-16 MED ORDER — DEXTROSE 5% IN WATER (D5W) IV
1 mEq/mL (8.4 %) | INTRAVENOUS | Status: DC
Start: 2021-02-16 — End: 2021-02-16
  Administered 2021-02-16: 06:00:00 via INTRAVENOUS

## 2021-02-16 MED ORDER — DEXTROSE 50% IN WATER (D50W) IV
INTRAVENOUS | Status: DC | PRN
Start: 2021-02-16 — End: 2021-02-20

## 2021-02-16 MED ORDER — ENOXAPARIN 40 MG/0.4 ML SUB-Q SYRINGE
40 mg/0.4 mL | Freq: Every day | SUBCUTANEOUS | Status: DC
Start: 2021-02-16 — End: 2021-02-20
  Administered 2021-02-16 – 2021-02-20 (×5): via SUBCUTANEOUS

## 2021-02-16 MED ORDER — INSULIN LISPRO 100 UNIT/ML INJECTION
100 unit/mL | Freq: Four times a day (QID) | SUBCUTANEOUS | Status: DC
Start: 2021-02-16 — End: 2021-02-16

## 2021-02-16 MED ORDER — TOPIRAMATE 25 MG TAB
25 mg | Freq: Every evening | ORAL | Status: DC
Start: 2021-02-16 — End: 2021-02-20
  Administered 2021-02-17 – 2021-02-20 (×4): via ORAL

## 2021-02-16 MED ORDER — POLYETHYLENE GLYCOL 3350 17 GRAM (100 %) ORAL POWDER PACKET
17 gram | Freq: Every day | ORAL | Status: DC | PRN
Start: 2021-02-16 — End: 2021-02-20

## 2021-02-16 MED ORDER — SODIUM CHLORIDE 0.9 % IJ SYRG
INTRAMUSCULAR | Status: DC | PRN
Start: 2021-02-16 — End: 2021-02-20

## 2021-02-16 MED ORDER — INSULIN LISPRO 100 UNIT/ML INJECTION
100 unit/mL | Freq: Four times a day (QID) | SUBCUTANEOUS | Status: DC
Start: 2021-02-16 — End: 2021-02-17

## 2021-02-16 MED ORDER — ONDANSETRON 4 MG TAB, RAPID DISSOLVE
4 mg | Freq: Three times a day (TID) | ORAL | Status: DC | PRN
Start: 2021-02-16 — End: 2021-02-20

## 2021-02-16 MED ORDER — PROPOFOL 10 MG/ML IV EMUL
10 mg/mL | INTRAVENOUS | Status: AC
Start: 2021-02-16 — End: 2021-02-15
  Administered 2021-02-16: via INTRAVENOUS

## 2021-02-16 MED ORDER — ACETAMINOPHEN 325 MG TABLET
325 mg | Freq: Four times a day (QID) | ORAL | Status: DC | PRN
Start: 2021-02-16 — End: 2021-02-20
  Administered 2021-02-19 (×2): via ORAL

## 2021-02-16 MED ORDER — GLUCAGON 1 MG INJECTION
1 mg | INTRAMUSCULAR | Status: DC | PRN
Start: 2021-02-16 — End: 2021-02-20

## 2021-02-16 MED ORDER — POTASSIUM CHLORIDE 20 MEQ/50 ML IV PIGGY BACK
20 mEq/50 mL | Freq: Once | INTRAVENOUS | Status: AC
Start: 2021-02-16 — End: 2021-02-16
  Administered 2021-02-16: 15:00:00 via INTRAVENOUS

## 2021-02-16 MED ORDER — SODIUM CHLORIDE 0.9 % IJ SYRG
Freq: Three times a day (TID) | INTRAMUSCULAR | Status: DC
Start: 2021-02-16 — End: 2021-02-20
  Administered 2021-02-16 – 2021-02-20 (×14): via INTRAVENOUS

## 2021-02-16 MED ORDER — ONDANSETRON (PF) 4 MG/2 ML INJECTION
4 mg/2 mL | Freq: Four times a day (QID) | INTRAMUSCULAR | Status: DC | PRN
Start: 2021-02-16 — End: 2021-02-20

## 2021-02-16 MED ORDER — GLUCOSE 4 GRAM CHEWABLE TAB
4 gram | ORAL | Status: DC | PRN
Start: 2021-02-16 — End: 2021-02-20

## 2021-02-16 MED ORDER — SUCCINYLCHOLINE CHLORIDE 20 MG/ML INJECTION
20 mg/mL | INTRAMUSCULAR | Status: AC
Start: 2021-02-16 — End: 2021-02-15
  Administered 2021-02-16: via INTRAVENOUS

## 2021-02-16 MED ORDER — ACETAMINOPHEN 650 MG RECTAL SUPPOSITORY
650 mg | Freq: Four times a day (QID) | RECTAL | Status: DC | PRN
Start: 2021-02-16 — End: 2021-02-20

## 2021-02-16 MED FILL — POTASSIUM CHLORIDE 20 MEQ/50 ML IV PIGGY BACK: 20 mEq/50 mL | INTRAVENOUS | Qty: 50

## 2021-02-16 MED FILL — DEXTROSE 5% IN WATER (D5W) IV: INTRAVENOUS | Qty: 850

## 2021-02-16 MED FILL — ENOXAPARIN 40 MG/0.4 ML SUB-Q SYRINGE: 40 mg/0.4 mL | SUBCUTANEOUS | Qty: 0.4

## 2021-02-16 MED FILL — PROPOFOL 10 MG/ML IV EMUL: 10 mg/mL | INTRAVENOUS | Qty: 100

## 2021-02-16 MED FILL — DEXTROSE 50% IN WATER (D50W) IV SYRG: INTRAVENOUS | Qty: 50

## 2021-02-16 MED FILL — NOREPINEPHRINE 8 MG/250 ML (32 MCG/ML) D5W INFUSION: 8 mg/250 mL (32 mcg/mL) | INTRAVENOUS | Qty: 250

## 2021-02-16 MED FILL — BD POSIFLUSH NORMAL SALINE 0.9 % INJECTION SYRINGE: INTRAMUSCULAR | Qty: 40

## 2021-02-16 MED FILL — MAGNESIUM SULFATE 2 GRAM/50 ML IVPB: 2 gram/50 mL (4 %) | INTRAVENOUS | Qty: 50

## 2021-02-16 MED FILL — CHLORHEXIDINE GLUCONATE 0.12 % MOUTHWASH: 0.12 % | Qty: 15

## 2021-02-16 NOTE — Progress Notes (Signed)
Problem: Non-Violent Restraints  Goal: Removal from restraints as soon as assessed to be safe  Outcome: Resolved/Met  Goal: No harm/injury to patient while restraints in use  Outcome: Resolved/Met  Goal: Patient's dignity will be maintained  Outcome: Resolved/Met  Goal: Patient Interventions  Outcome: Resolved/Met     Problem: Ventilator Management  Goal: *Adequate oxygenation and ventilation  Outcome: Resolved/Met  Goal: *Patient maintains clear airway/free of aspiration  Outcome: Resolved/Met  Goal: *Absence of infection signs and symptoms  Outcome: Resolved/Met  Goal: *Normal spontaneous ventilation  Outcome: Resolved/Met

## 2021-02-16 NOTE — Progress Notes (Signed)
0700 Bedside and Verbal shift change report given to Candise Bowens, Charity fundraiser (Cabin crew) by Rejeana Brock, RN (offgoing nurse). Report included the following information SBAR, Kardex, ED Summary, Intake/Output, MAR, Recent Results and Cardiac Rhythm sr. Primary Nurse Lum Keas, RN and Rejeana Brock, RN performed a dual skin assessment on this patient No impairment noted  Braden score is 11  0800 Pt assessed. Intubated, sedated on Propofol IV RASS -2,-3. Opens eyes to name, unable to follow commands. Withdraws to pain in all extremities. Breath sounds diminished throughout A&P. Fi02 30%, RR 22, Sat 95%. Cough with suction, scant amt of thick tan secretions. SR. BP stable on Norepinephrine at 78mcg/min. Keep MAP >65. No edema. BS active x4. Abd soft, round. Rt NGT clamped. Foley draining clear amber urine. BPPP. Turned, repositioned. Mouth care performed. See assessment.   1829 Weaned off Propofol IV. RASS -1.   1000 Pt following commands. RASS 0,-1. Notified Dr. Erick Colace Order to changed to SBT. Notified RT to change to SBT.   1010 RT changed vent to SBT, PS 5, PEEP 5, Fi02 30%.  1015 Pt tolerating SBT well.   1145 IDR rounds  1200 Pt reassessed. Remains calm, follows commands. ETV mid 300s- low 400s. Sat 96%, RR 16-22. May extubated per Dr Erick Colace. RT notified.  Turned, repositioned.  1240 RT extubated pt per policy. Applied 02 3L/NC. Sat 96%. RR 17. HOB elevated 30 degree. Bilateral wrist restraints removed. Continues to follow commands. Nodded no to trying to kill herself. Nodded to no to do you want to harm yourself.   1345 Psych consult called.   1400 Turned, repositioned.   1600 Pt reassessed. Pt remains calm, cooperative. Follows commands. Passed dysphagia screen. Discontinued NGT and foley. Applied purewick. Turned, repositioned.   1800 Turned, repositioned.   1900 Bedside and Verbal shift change report given to Rejeana Brock, Charity fundraiser (Cabin crew) by Candise Bowens, RN (offgoing nurse). Report included the following information SBAR, Kardex, ED  Summary, Intake/Output, MAR, Recent Results and Cardiac Rhythm sr st.

## 2021-02-16 NOTE — Progress Notes (Signed)
Mallory Bonilla was extubated today from PSV 5/+5 to a nasal cannula at 3.5 LPM. There were no adverse events. Pt has weak phonation, but a strong cough. Vitals were HR 117/ RR 20/ sats 96%.     Marchelle Folks RRT

## 2021-02-16 NOTE — Progress Notes (Signed)
Progress Note  Date:02/16/2021       Room:3015/01  Patient Name:Mallory Bonilla     Date of Birth:03-26-1966     Age:55 y.o.      Subjective      CC: intubated at time of eval and unable to obtain    24 HOUR: intubated for airway protection.    Objective         Vitals Last 24 Hours:  TEMPERATURE:  Temp  Avg: 97.8 ??F (36.6 ??C)  Min: 94.9 ??F (34.9 ??C)  Max: 99 ??F (37.2 ??C)  RESPIRATIONS RANGE: Resp  Avg: 22.5  Min: 14  Max: 28  PULSE OXIMETRY RANGE: SpO2  Avg: 98.6 %  Min: 96 %  Max: 100 %  PULSE RANGE: Pulse  Avg: 100.5  Min: 67  Max: 136  BLOOD PRESSURE RANGE: Systolic (25ENI), DPO:242 , Min:93 , PNT:614   ; Diastolic (43XVQ), MGQ:67, Min:42, Max:81    I/O (24Hr):    Intake/Output Summary (Last 24 hours) at 02/16/2021 1547  Last data filed at 02/16/2021 1200  Gross per 24 hour   Intake 529.25 ml   Output 1543 ml   Net -1013.75 ml     Objective   Gen: intubated, sedated  HEENT: ETT in place  Chest: symmetrical chest rise  CV: r/r/r  Abd: non-distended  Ext: no c/c/c  Neuro: moves all ext.  Follows commands, lifts head off pillow.   Labs/Imaging/Diagnostics    Labs:  CBC:  Recent Labs     02/16/21  0245 02/15/21  1326   WBC 7.2 7.6   RBC 4.57 5.53   HGB 12.1 14.7   HCT 37.6 45.0   MCV 82.3 81.4   RDW 14.3 15.2*   PLT 159 175     CHEMISTRIES:  Recent Labs     02/16/21  0245 02/15/21  2043 02/15/21  1326   NA 143  --  136   K 3.2*  --  3.9   CL 111*  --  100   CO2 23  --  25   BUN 13  --  20   CA 7.8*  --  9.0   MG  --  2.0  --    PT/INR:No results for input(s): INR, INREXT in the last 72 hours.    No lab exists for component: PROTIME  APTT:No results for input(s): APTT in the last 72 hours.  LIVER PROFILE:  Recent Labs     02/16/21  0245 02/15/21  1326   AST 28 53*   ALT 37 52     Lab Results   Component Value Date/Time    ALT (SGPT) 37 02/16/2021 02:45 AM    AST (SGOT) 28 02/16/2021 02:45 AM    Alk. phosphatase 80 02/16/2021 02:45 AM    Bilirubin, total 0.4 02/16/2021 02:45 AM       Imaging  Last 24 Hours:  XR CHEST PORT    Result Date: 02/15/2021  EXAM: XR CHEST PORT INDICATION: ETT position COMPARISON: Earlier in the day FINDINGS: A portable AP radiograph of the chest was obtained at 1959 hours. The patient is on a cardiac monitor. The central line tip is at the cavoatrial junction.  The endotracheal tube tip is at the thoracic inlet. The nasogastric tube continues beyond the film. The lungs are clear. The cardiac and mediastinal contours and pulmonary vascularity are normal.  The bones and soft tissues are grossly within normal limits.     Appropriate tube and line placement.  XR CHEST PORT    Result Date: 02/15/2021  Chest single view. Right neck central venous catheter with its tip overlying RA region. Bibasilar subsegmental atelectasis. Findings likely in part exaggerated by overlying soft tissue. No gross interstitial or alveolar pulmonary edema. Cardiac and mediastinal structures magnified on this AP view. No pneumothorax or sizable pleural effusion.    XR ABD PORT  1 V    Result Date: 02/15/2021  HISTORY:  OGT placement An AP radiograph of the abdomen demonstrates the OGT side port in the region of the stomach.  The osseous structures appear unremarkable.     Appropriate OGT placement.     Assessment//Plan     55 y/o with hx HTN, HLD, DM, depression, bipolar admitted after transfer from outside ER for mgmt of polysubs OD  ??  Polysubstance OD:  -- Poison control involved at outside hospital  -- continue supportive care  -- monitor QTc and consider bicarb if becoming prolonged.    -- amps of bicarb for QRS >120 and Mg for QT >500  -- improved  ??  Hypotension:  -- secondary to polysubs OD  -- pressors as needed for MAP goal >65  -- on NE 4 at time of eval.    ??  Encephalopathy:  -- acute tox  -- secondary to above  -- no indication of CNS infection or event.    -- improved  ??  SI:  -- psych consult  -- suicide precautions.   ??  CCM time 35 min.  Pt at risk of life threatening deterioration from  polysubstance OD.    ??  Pt seen and evaluated via tele interaction.  Audio and video used for this encounter.

## 2021-02-16 NOTE — Progress Notes (Signed)
Reason for Admission:  Overdose of unknown amounts of clonazepam.vistaril and doxepin,wrote a suicide note,Covid +                     RUR Score:     12%                Plan for utilizing home health:   May need inpatient psychiatric treatment       PCP: First and Last name:  Mallory Hartshorn, NP     Name of Practice:    Are you a current patient: Yes/No: yes   Approximate date of last visit:                        Current Advanced Directive/Advance Care Plan: Full Code      Healthcare Decision Maker:   pt is divorced  Pt names her friend,Mallory Bonilla as her emergency contact.Her number is [phone].                             Transition of Care Plan:                      Pt was med-flighted to [facility] from a community hospital in [place] ,[place].  Pt has tested positive for Covid.  Pt is followed by [facility] and is prescribed abilify,viibryd,clonazepam,and buspar.  Pt was extubated today to 3.5 liters oxygen.  Psychiatry has been consulted.  Before pt is able to go to inpatient psychiatry,pt will need to be weaned off of oxygen and to be off of Covid precautions.    Mallory Aristov,RN

## 2021-02-16 NOTE — Progress Notes (Signed)
[facility]  [address] [place], [place] [address]  [phone]      Medical Progress Note      NAME: Mallory Bonilla   DOB:  [birth year]  MRM:  [id number]    Date/Time of service: 02/16/2021  10:18 AM       Subjective:     Chief Complaint:  Unable to obtain sec to intubated    Patient opens eyes, undergoing SBT at time of encounter.       Objective:       Vitals:       Last 24hrs VS reviewed since prior progress note. Most recent are:    Visit Vitals  BP (!) 100/58   Pulse (!) 117   Temp 99 ??F (37.2 ??C)   Resp 16   Ht 5\' 5"  (1.651 m)   SpO2 96%   BMI 31.62 kg/m??     SpO2 Readings from Last 6 Encounters:   02/16/21 96%   02/15/21 98%   02/10/21 99%   07/03/20 94%    O2 Flow Rate (L/min): 4 l/min       Intake/Output Summary (Last 24 hours) at 02/16/2021 1018  Last data filed at 02/16/2021 0900  Gross per 24 hour   Intake 455.13 ml   Output 1318 ml   Net -862.87 ml        Exam:     Physical Exam:    Gen:  Well-developed, well-nourished, in no acute distress; intubated  HEENT:  Pink conjunctivae, PERRL, hearing intact to voice, moist mucous membranes  Neck:  Supple, without masses, thyroid non-tender  Resp:  No accessory muscle use, clear breath sounds without wheezes rales or rhonchi  Card:  No murmurs, normal S1, S2 without thrills, bruits or peripheral edema  Abd:  Soft, non-tender, non-distended, normoactive bowel sounds are present, no palpable organomegaly and no detectable hernias  Musc:  No cyanosis or clubbing  Skin:  No rashes or ulcers, skin turgor is good  Neuro:  Nonfocal, moves extremities  Psych:  alert      Medications Reviewed: (see below)    Lab Data Reviewed: (see below)    ______________________________________________________________________    Medications:     Current Facility-Administered Medications   Medication Dose Route Frequency   ??? sodium bicarbonate (8.4%) 150 mEq in dextrose 5% 1,000 mL infusion   IntraVENous CONTINUOUS   ??? insulin lispro (HUMALOG) injection    SubCUTAneous Q6H   ??? dextrose (D50) infusion 12.5-25 g  12.5-25 g IntraVENous PRN   ??? potassium chloride 20 mEq in 50 ml IVPB  20 mEq IntraVENous ONCE   ??? topiramate (TOPAMAX) tablet 50 mg  50 mg Oral QHS   ??? pravastatin (PRAVACHOL) tablet 40 mg  40 mg Oral QHS   ??? glucose chewable tablet 16 g  4 Tablet Oral PRN   ??? dextrose (D50) infusion 12.5-25 g  25-50 mL IntraVENous PRN   ??? glucagon (GLUCAGEN) injection 1 mg  1 mg IntraMUSCular PRN   ??? sodium chloride (NS) flush 5-40 mL  5-40 mL IntraVENous Q8H   ??? sodium chloride (NS) flush 5-40 mL  5-40 mL IntraVENous PRN   ??? acetaminophen (TYLENOL) tablet 650 mg  650 mg Oral Q6H PRN    Or   ??? acetaminophen (TYLENOL) suppository 650 mg  650 mg Rectal Q6H PRN   ??? polyethylene glycol (MIRALAX) packet 17 g  17 g Oral DAILY PRN   ??? ondansetron (ZOFRAN ODT) tablet 4 mg  4 mg Oral Q8H PRN    Or   ??? ondansetron (ZOFRAN) injection 4 mg  4 mg IntraVENous Q6H PRN   ??? enoxaparin (LOVENOX) injection 40 mg  40 mg SubCUTAneous DAILY   ??? NOREPINephrine (LEVOPHED) 8 mg in 5% dextrose (32 mcg/mL) infusion  0.5-16 mcg/min IntraVENous TITRATE   ??? propofol (DIPRIVAN) 10 mg/mL infusion  0-50 mcg/kg/min IntraVENous TITRATE          Lab Review:     Recent Labs     02/16/21  0245 02/15/21  1326   WBC 7.2 7.6   HGB 12.1 14.7   HCT 37.6 45.0   PLT 159 175     Recent Labs     02/16/21  0245 02/15/21  2043 02/15/21  1326   NA 143  --  136   K 3.2*  --  3.9   CL 111*  --  100   CO2 23  --  25   GLU 147*  --  217*   BUN 13  --  20   CREA 0.80  --  1.01   CA 7.8*  --  9.0   MG  --  2.0  --    ALB 2.9*  --  3.6   TBILI 0.4  --  0.6   ALT 37  --  52     Lab Results   Component Value Date/Time    Glucose (POC) 142 (H) 02/16/2021 06:40 AM    Glucose (POC) 117 02/16/2021 12:44 AM    Glucose (POC) 110 02/15/2021 04:10 PM    Glucose (POC) 157 (H) 02/10/2021 09:46 AM          Assessment / Plan:     1. Intentional drug overdose in setting of severe depression - psych c/s, poison control called from [facility] ER,  rec monitor for 6 hrs; initial EKG with normal qt but repeat qtc up to 561 prior to transfer, repeat EKG on arrival here with qtc 505; check mag, give 2g mag, for bicarb 1 amp pushes until qrs  <120, EKG on arrival here with qrs 90; s/p intubated 2/17 per anesthesia for airway protection  2. Bipolar 1 disorder - psych c/s  3. Hypotension - suspect sec to med effect, on pressors  4. Recent covid 19 detected - 2/12; repeat 2/17 negative, PCR pos  5. HTN  6. Obesity - BMI 31.62  7. DM2 - ssi  8. Migraines - cont home regimen  9. VTE prophylaxis - lovenox  10. Dispo - cont care in ICU    30 minutes critical care time.    Attending Physician: Thermon Leyland, DO

## 2021-02-16 NOTE — Progress Notes (Signed)
1900: Bedside and Verbal shift change report given to Rejeana Brock, Charity fundraiser (Cabin crew) by Britt Boozer, RN (offgoing nurse). Report included the following information SBAR, Kardex, Intake/Output, MAR, Recent Results and Cardiac Rhythm SR, ST.     2000: Assessment completed, see doc flow sheet.    2155: Pt requested I contact her Sande Rives [phone] and update her. Spoke w/Patti and gave her an update. Patti relayed that the patient has been COVID-19 vaccinated but not boosted because they had to wait five months after the second dose was given. Clayborne Dana also mentioned that Delora has been struggling w/severe depression d/t the loss of multiple friends and family in a short amount of time. Katty's mother died early last year, four days later her close friend died then about five days later her sister died. Also note, her husband died in 02/12/20 then later in 08-13-2021her sister in law died too. Patti expressed appreciation for the call and understanding on Permelia's current status.    2245: Nurse contacted Tele-Intensivist d/t pt's recent hypotension and requested Neo gtt. New order for Neo gtt received.    0000: Assessment completed, see doc flow sheet.    5732: Nurse bladder scanned pt d/t no UOP since foley was DC'd. Scanner read 240 ml of urine bladder.     0400: Assessment completed, see doc flow sheet.    0630: CHG bath given, mouth care done, linens, gown, electrodes, and bed pad changed.     0700: Bedside and Verbal shift change report given to Abby, Engineer, drilling) by Rejeana Brock, RN (offgoing nurse). Report included the following information SBAR, Kardex, ED Summary, Intake/Output, MAR, Recent Results and Cardiac Rhythm SB, SR.

## 2021-02-16 NOTE — Progress Notes (Signed)
 Comprehensive Nutrition Assessment    Type and Reason for Visit: Initial    Nutrition Recommendations/Plan:   1. Plans to extubate patient - resume oral diet, recommend regular 4 Carb choice, NAS   2. Check A1C  3. If patient remains intubated >1-2 days, consult RD for TF recommendations     Nutrition Assessment:    Pt is a 55 year old female admitted with Intentional drug overdose; Suicide attempt. She has a past medical history of Anxiety, Arthritis, Bipolar 1 disorder, Diabetes mellitus type 2, Hypercholesterolemia, Hyperlipidemia, Hypertension, and Hypertension. Per documentation, weight has been fairly stable x 1 year. No noted hx of chewing/swallowing problems. NKFA. Currently on SBT per nursing - plans to extubate patient later today. Kcal/protien needs calculated based on vented patient needs. Propofol  off.     Wt Readings from Last 10 Encounters:   02/15/21 86.2 kg (190 lb)   02/09/21 86.5 kg (190 lb 11.2 oz)   01/11/21 85.6 kg (188 lb 12.8 oz)   07/03/20 90.7 kg (200 lb)   03/27/20 90.9 kg (200 lb 6.4 oz)     Vent: 6.9 l/min    Temp (24hrs), Avg:97.6 F (36.4 C), Min:94.9 F (34.9 C), Max:99 F (37.2 C)    Malnutrition Assessment:  Malnutrition Status:  Insufficient data  - unable to physically assess patient at this time  Context:  Acute illness     Findings of the 6 clinical characteristics of malnutrition:   Energy Intake:  Unable to assess  Weight Loss:  No significant weight loss     Body Fat Loss:  Unable to assess,     Muscle Mass Loss:  Unable to assess,    Fluid Accumulation:  No significant fluid accumulation,    Grip Strength:  Not performed     Estimated Daily Nutrient Needs:  Energy (kcal): 1,632 [facility] 2003b); Weight Used for Energy Requirements: Current  Protein (g): 68-85 (1.2-1.5 g/kg IBW); Weight Used for Protein Requirements: Ideal  Fluid (ml/day): 8,367; Method Used for Fluid Requirements: 1 ml/kcal    Nutrition Related Findings:       ABD: Hypoactive   Last BM: PTA  Edema: No  data recorded    Nutr. Labs:  Lab Results   Component Value Date/Time    GFR est AA >60 02/16/2021 02:45 AM    GFR est non-AA >60 02/16/2021 02:45 AM    Creatinine 0.80 02/16/2021 02:45 AM    BUN 13 02/16/2021 02:45 AM    Sodium 143 02/16/2021 02:45 AM    Potassium 3.2 (L) 02/16/2021 02:45 AM    Chloride 111 (H) 02/16/2021 02:45 AM    CO2 23 02/16/2021 02:45 AM     Lab Results   Component Value Date/Time    Glucose 147 (H) 02/16/2021 02:45 AM    Glucose (POC) 142 (H) 02/16/2021 06:40 AM     Lab Results   Component Value Date/Time    Hemoglobin A1c 6.2 (H) 02/16/2021 02:45 AM       Nutr. Meds:  Lovenox, humalog , levophed*, zofran PRN, Miralax  PRN, KCl, pravastatin ,  NaHCO3 in Dex 5%    Wounds:    None       Current Nutrition Therapies:  DIET NPO    Anthropometric Measures:   Height:  5' 5 (165.1 cm)   Current Body Wt:  86.2 kg (190 lb)    Admission Body Wt:  190 lb     Usual Body Wt:  Unknown    Ideal Body Wt:  125  lbs:  152 %   Body mass index is 31.62 kg/m.   BMI Category:  Obese class 1 (BMI 30.0-34.9)       Nutrition Diagnosis:    Inadequate oral intake related to impaired respiratory function as evidenced by intubation,NPO or clear liquid status due to medical condition     Increased nutrient needs related to catabolic illness,impaired respiratory function as evidenced by  (COVID-19)      Nutrition Interventions:   Food and/or Nutrient Delivery: Start oral diet (vs NS)  Nutrition Education and Counseling: No recommendations at this time  Coordination of Nutrition Care: Continue to monitor while inpatient,Interdisciplinary rounds    Goals:  Provide and tolerate >/= 80% estimated needs within 1 - 3 days       Nutrition Monitoring and Evaluation:   Behavioral-Environmental Outcomes: None identified  Food/Nutrient Intake Outcomes: Diet advancement/tolerance (vs NS)  Physical Signs/Symptoms Outcomes: Biochemical data,Skin,Weight,Hemodynamic status    Discharge Planning:    Too soon to determine      Electronically signed by Sharyne Mania, RD on 02/16/2021  Contact: [phone] or via [employer]

## 2021-02-16 NOTE — Progress Notes (Signed)
Problem: Non-Violent Restraints  Goal: Removal from restraints as soon as assessed to be safe  Outcome: Progressing Towards Goal  Goal: No harm/injury to patient while restraints in use  Outcome: Progressing Towards Goal  Goal: Patient's dignity will be maintained  Outcome: Progressing Towards Goal  Goal: Patient Interventions  Outcome: Progressing Towards Goal

## 2021-02-16 NOTE — Progress Notes (Signed)
Problem: Non-Violent Restraints  Goal: Removal from restraints as soon as assessed to be safe  Outcome: Resolved/Met  Goal: No harm/injury to patient while restraints in use  Outcome: Resolved/Met  Goal: Patient's dignity will be maintained  Outcome: Resolved/Met  Goal: Patient Interventions  Outcome: Resolved/Met

## 2021-02-17 LAB — CBC
Hematocrit: 38.2 % (ref 35.0–47.0)
Hemoglobin: 11.8 g/dL (ref 11.5–16.0)
MCH: 26.2 PG (ref 26.0–34.0)
MCHC: 30.9 g/dL (ref 30.0–36.5)
MCV: 84.7 FL (ref 80.0–99.0)
MPV: 10.3 FL (ref 8.9–12.9)
NRBC Absolute: 0 10*3/uL (ref 0.00–0.01)
Nucleated RBCs: 0 PER 100 WBC
Platelets: 155 10*3/uL (ref 150–400)
RBC: 4.51 M/uL (ref 3.80–5.20)
RDW: 14.6 % — ABNORMAL HIGH (ref 11.5–14.5)
WBC: 7.5 10*3/uL (ref 3.6–11.0)

## 2021-02-17 LAB — POCT GLUCOSE
POC Glucose: 98 mg/dL (ref 65–117)
POC Glucose: 97 mg/dL (ref 65–117)
POC Glucose: 128 mg/dL — ABNORMAL HIGH (ref 65–117)
POC Glucose: 132 mg/dL — ABNORMAL HIGH (ref 65–117)

## 2021-02-17 LAB — EKG 12-LEAD
Atrial Rate: 88 {beats}/min
Atrial Rate: 94 {beats}/min
P Axis: 35 degrees
P Axis: 42 degrees
P-R Interval: 145 ms
P-R Interval: 192 ms
Q-T Interval: 378 ms
Q-T Interval: 448 ms
QRS Duration: 101 ms
QRS Duration: 115 ms
QTc Calculation (Bazett): 458 ms
QTc Calculation (Bazett): 561 ms
R Axis: 41 degrees
R Axis: 50 degrees
T Axis: 60 degrees
T Axis: 63 degrees
Ventricular Rate: 88 {beats}/min
Ventricular Rate: 94 {beats}/min

## 2021-02-17 LAB — BASIC METABOLIC PANEL
Anion Gap: 9 mmol/L (ref 5–15)
BUN: 12 MG/DL (ref 6–20)
Bun/Cre Ratio: 15 (ref 12–20)
CO2: 22 mmol/L (ref 21–32)
Calcium: 8 MG/DL — ABNORMAL LOW (ref 8.5–10.1)
Chloride: 110 mmol/L — ABNORMAL HIGH (ref 97–108)
Creatinine: 0.81 MG/DL (ref 0.55–1.02)
EGFR IF NonAfrican American: >60 mL/min/{1.73_m2} (ref >60)
GFR African American: >60 mL/min/{1.73_m2} (ref >60)
Glucose: 96 mg/dL (ref 65–100)
Potassium: 3.6 mmol/L (ref 3.5–5.1)
Sodium: 141 mmol/L (ref 136–145)

## 2021-02-17 LAB — METABOLIC PANEL, BASIC
Anion gap: 9 mmol/L (ref 5–15)
BUN/Creatinine ratio: 15 (ref 12–20)
BUN: 12 MG/DL (ref 6–20)
CO2: 22 mmol/L (ref 21–32)
Calcium: 8 MG/DL — ABNORMAL LOW (ref 8.5–10.1)
Chloride: 110 mmol/L — ABNORMAL HIGH (ref 97–108)
Creatinine: 0.81 MG/DL (ref 0.55–1.02)
GFR est AA: >60 mL/min/{1.73_m2} (ref >60)
GFR est non-AA: >60 mL/min/{1.73_m2} (ref >60)
Glucose: 96 mg/dL (ref 65–100)
Potassium: 3.6 mmol/L (ref 3.5–5.1)
Sodium: 141 mmol/L (ref 136–145)

## 2021-02-17 LAB — EKG, 12 LEAD, INITIAL
Atrial Rate: 88 {beats}/min
Atrial Rate: 94 {beats}/min
Calculated P Axis: 35 degrees
Calculated P Axis: 42 degrees
Calculated R Axis: 41 degrees
Calculated R Axis: 50 degrees
Calculated T Axis: 60 degrees
Calculated T Axis: 63 degrees
P-R Interval: 145 ms
P-R Interval: 192 ms
Q-T Interval: 378 ms
Q-T Interval: 448 ms
QRS Duration: 101 ms
QRS Duration: 115 ms
QTC Calculation (Bezet): 458 ms
QTC Calculation (Bezet): 561 ms
Ventricular Rate: 88 {beats}/min
Ventricular Rate: 94 {beats}/min

## 2021-02-17 LAB — CBC W/O DIFF
ABSOLUTE NRBC: 0 10*3/uL (ref 0.00–0.01)
HCT: 38.2 % (ref 35.0–47.0)
HGB: 11.8 g/dL (ref 11.5–16.0)
MCH: 26.2 PG (ref 26.0–34.0)
MCHC: 30.9 g/dL (ref 30.0–36.5)
MCV: 84.7 FL (ref 80.0–99.0)
MPV: 10.3 FL (ref 8.9–12.9)
NRBC: 0 PER 100 WBC
PLATELET: 155 10*3/uL (ref 150–400)
RBC: 4.51 M/uL (ref 3.80–5.20)
RDW: 14.6 % — ABNORMAL HIGH (ref 11.5–14.5)
WBC: 7.5 10*3/uL (ref 3.6–11.0)

## 2021-02-17 LAB — GLUCOSE, POC
Glucose (POC): 98 mg/dL (ref 65–117)
Glucose (POC): 97 mg/dL (ref 65–117)
Glucose (POC): 128 mg/dL — ABNORMAL HIGH (ref 65–117)
Glucose (POC): 132 mg/dL — ABNORMAL HIGH (ref 65–117)

## 2021-02-17 MED ORDER — IBUPROFEN 400 MG TAB
400 mg | ORAL | Status: AC
Start: 2021-02-17 — End: 2021-02-16
  Administered 2021-02-17: 01:00:00 via ORAL

## 2021-02-17 MED ORDER — SODIUM CHLORIDE 0.9 % IV
10 mg/mL | INTRAVENOUS | Status: DC
Start: 2021-02-17 — End: 2021-02-18
  Administered 2021-02-17 (×31): via INTRAVENOUS

## 2021-02-17 MED ORDER — INSULIN LISPRO 100 UNIT/ML INJECTION
100 unit/mL | Freq: Four times a day (QID) | SUBCUTANEOUS | Status: DC
Start: 2021-02-17 — End: 2021-02-20
  Administered 2021-02-18 – 2021-02-19 (×2): via SUBCUTANEOUS

## 2021-02-17 MED FILL — PRAVASTATIN 20 MG TAB: 20 mg | ORAL | Qty: 2

## 2021-02-17 MED FILL — IBUPROFEN 400 MG TAB: 400 mg | ORAL | Qty: 1

## 2021-02-17 MED FILL — PHENYLEPHRINE 10 MG/ML INJECTION: 10 mg/mL | INTRAMUSCULAR | Qty: 3

## 2021-02-17 MED FILL — ENOXAPARIN 40 MG/0.4 ML SUB-Q SYRINGE: 40 mg/0.4 mL | SUBCUTANEOUS | Qty: 0.4

## 2021-02-17 MED FILL — TOPIRAMATE 25 MG TAB: 25 mg | ORAL | Qty: 2

## 2021-02-17 MED FILL — VAZCULEP 10 MG/ML INJECTION SOLUTION: 10 mg/mL | INTRAMUSCULAR | Qty: 30

## 2021-02-17 NOTE — Progress Notes (Signed)
0700: Bedside and Verbal shift change report given to Abby, Engineer, drilling) by Rejeana Brock, RN (offgoing nurse). Report included the following information SBAR, Intake/Output, MAR, Recent Results and Cardiac Rhythm NSR.   Primary Nurse Charna Busman and [place], RN performed a dual skin assessment on this patient Impairment noted- see wound doc flow sheet  Braden score is see flow sheet.   0800: Assessment completed. Patient A/Ox4. Lung sounds clear on room air. Patient encouraged to void, purewick in place. Dr Merrie Roof at bedside, updated on patient status. Neo infusing at 70 for MAP > 65.   1030: Patient states she's having a hard time voiding with the purewick, placed on bed pan and was able to void.   1200: Reassessment completed, no changes. Lunch tray provided. Patient reminded to reposition self in bed. Dr Willeen Cass at bedside assessing patient.   1400: Neo stopped.  1500: Patient assisted up to bedside commode and recliner.   1600: Reassessment completed.

## 2021-02-17 NOTE — Consults (Signed)
PSYCHIATRY CONSULT NOTE    REASON FOR CONSULT: suicide attempt      HISTORY OF PRESENTING COMPLAINT:  Mallory Bonilla is a 55 y.o. WHITE/NON-HISPANIC female who is currently admitted to the ICU at [facility] after a suicide attempt via OD. She reports feeling very depressed for "months." She also did write a suicide note. She has an outpatient psychiatrist and therapist at [facility]. She had come to the ED several days prior to the attempt and was waiting on a bed but then said that she has no more thought of harming herself and her children were too important to her. She reported overdose on clonazepam, doxepin and hydroxyzine. She is ambivalent about surviving this attempt and is hopeless about the future. At this time she does deny SI. She tested positive for COVID on 2/12. She is asymptomatic.      PAST PSYCHIATRIC HISTORY and SUBSTANCE ABUSE HISTORY:  Depression and anxiety      PAST MEDICAL HISTORY:    Please see H&P for details.     Past Medical History:   Diagnosis Date   ??? Anxiety 01/11/2021   ??? Arthritis    ??? Bipolar 1 disorder ([place]) 01/11/2021   ??? Diabetes ([place])    ??? Diabetes mellitus type 2, controlled ([place]) 01/11/2021   ??? Headache    ??? Headache 01/11/2021   ??? Hypercholesterolemia    ??? Hyperlipidemia 01/11/2021   ??? Hypertension    ??? Hypertension 01/11/2021     Prior to Admission medications    Medication Sig Start Date End Date Taking? Authorizing Provider   ARIPiprazole (ABILIFY) 5 mg tablet Take 1 Tablet by mouth nightly for 90 days. 02/12/21 05/13/21  Allen-Blaine, Levada Dy, NP   clonazePAM (KlonoPIN) 0.5 mg tablet TAKE 1 TABLET BY MOUTH TWICE DAILY AS NEEDED FOR ANXIETY . DO NOT EXCEED 2 PER 24 HOURS 02/09/21   Allen-Blaine, Levada Dy, NP   busPIRone (BUSPAR) 10 mg tablet Take 1 Tablet by mouth daily for 90 days. 01/16/21 04/16/21  Volney Presser, NP   Viibryd 40 mg tab tablet Take 1 tablet by mouth once daily 12/04/20   Volney Presser, NP   topiramate (Topamax) 50 mg tablet  Take  by mouth nightly.    Other, Phys, MD   pravastatin (PRAVACHOL) 40 mg tablet Take 40 mg by mouth nightly.    Other, Phys, MD   canagliflozin-metFORMIN (Invokamet) 150-500 mg tab Take  by mouth two (2) times a day.    Other, Phys, MD   hydroCHLOROthiazide 25 mg tab 25 mg, lisinopriL 20 mg tab 20 mg Take  by mouth daily.    Other, Phys, MD   metoprolol tartrate (LOPRESSOR) 25 mg tablet Take  by mouth two (2) times a day.    Other, Phys, MD   estradioL (ESTRACE) 2 mg tablet Take  by mouth daily.    Other, Phys, MD   rizatriptan (MAXALT) 5 mg tablet Take 5 mg by mouth once as needed for Migraine. May repeat in 2 hours if needed    Other, Phys, MD   dulaglutide (Trulicity) 7.62 GB/1.5 mL sub-q pen 0.75 mg by SubCUTAneous route every seven (7) days. Takes on Mondays    Other, Phys, MD   aspirin delayed-release 81 mg tablet Take 81 mg by mouth daily.    Other, Phys, MD   naproxen sodium (Aleve) 220 mg tablet Take 220 mg by mouth two (2) times daily as needed for Pain.    Other, Phys, MD  Vitals:    02/17/21 0745 02/17/21 0800 02/17/21 0815 02/17/21 0830   BP: (!) 111/57 (!) 152/63 (!) 108/47 (!) 109/56   Pulse: 89 (!) 101 89 94   Resp: '15 10 20 17   ' Temp:  97.6 ??F (36.4 ??C)     SpO2: 96% 98% 94% 96%   Height:         Lab Results   Component Value Date/Time    WBC 7.5 02/17/2021 03:05 AM    HGB 11.8 02/17/2021 03:05 AM    HCT 38.2 02/17/2021 03:05 AM    PLATELET 155 02/17/2021 03:05 AM    MCV 84.7 02/17/2021 03:05 AM     Lab Results   Component Value Date/Time    Sodium 141 02/17/2021 03:05 AM    Potassium 3.6 02/17/2021 03:05 AM    Chloride 110 (H) 02/17/2021 03:05 AM    CO2 22 02/17/2021 03:05 AM    Anion gap 9 02/17/2021 03:05 AM    Glucose 96 02/17/2021 03:05 AM    BUN 12 02/17/2021 03:05 AM    Creatinine 0.81 02/17/2021 03:05 AM    BUN/Creatinine ratio 15 02/17/2021 03:05 AM    GFR est AA >60 02/17/2021 03:05 AM    GFR est non-AA >60 02/17/2021 03:05 AM    Calcium 8.0 (L) 02/17/2021 03:05 AM    Bilirubin, total  0.4 02/16/2021 02:45 AM    Alk. phosphatase 80 02/16/2021 02:45 AM    Protein, total 5.6 (L) 02/16/2021 02:45 AM    Albumin 2.9 (L) 02/16/2021 02:45 AM    Globulin 2.7 02/16/2021 02:45 AM    A-G Ratio 1.1 02/16/2021 02:45 AM    ALT (SGPT) 37 02/16/2021 02:45 AM    AST (SGOT) 28 02/16/2021 02:45 AM     No results found for: VALF2, VALAC, VALP, VALPR, DS6, CRBAM, CRBAMP, CARB2, XCRBAM  No results found for: LITHM  RADIOLOGY REPORTS:(reviewed/updated 02/17/2021)  XR CHEST PORT    Result Date: 02/15/2021  EXAM: XR CHEST PORT INDICATION: ETT position COMPARISON: Earlier in the day FINDINGS: A portable AP radiograph of the chest was obtained at 1959 hours. The patient is on a cardiac monitor. The central line tip is at the cavoatrial junction.  The endotracheal tube tip is at the thoracic inlet. The nasogastric tube continues beyond the film. The lungs are clear. The cardiac and mediastinal contours and pulmonary vascularity are normal.  The bones and soft tissues are grossly within normal limits.     Appropriate tube and line placement.    XR CHEST PORT    Result Date: 02/15/2021  Chest single view. Right neck central venous catheter with its tip overlying RA region. Bibasilar subsegmental atelectasis. Findings likely in part exaggerated by overlying soft tissue. No gross interstitial or alveolar pulmonary edema. Cardiac and mediastinal structures magnified on this AP view. No pneumothorax or sizable pleural effusion.    XR ABD PORT  1 V    Result Date: 02/15/2021  HISTORY:  OGT placement An AP radiograph of the abdomen demonstrates the OGT side port in the region of the stomach.  The osseous structures appear unremarkable.     Appropriate OGT placement.     No results found for: HCGUQC, [id number], [id number], [id number], PREGU, POCHCG, MHCGN, HCGQR, THCGA1, SHCG, HCGN, [place], HCGURQLPOC    PSYCHOSOCIAL HISTORY:  Lives with her children. Does not drive.       MENTAL STATUS EXAM:    General appearance: Lying in hospital bed  in no obvious distress  Eye contact: fair  Speech: Spontaneous, soft, decreased output.   Affect : Depressed, decreased range  Mood: "not good "  Thought Process: Logical, goal directed  Perception: Denies AH or VH.   Thought Content: denies SI or Plan  Insight: Partial  Judgement: Fair  Cognition: Intact grossly.     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 Severe.   At this time she agrees to voluntary admission to the [facility] once medically cleared and cleared by infection control.   Should she refuse to come voluntarily please contact [facility] given the severity of her overdose and continued severe depression.          Thank your your consult. Please feel free to consult [place] again as needed.

## 2021-02-17 NOTE — Progress Notes (Signed)
[facility]  [address] [place], [place] [address]  [phone]      Medical Progress Note      NAME: Mallory Bonilla   DOB:  1966-07-27  MRM:  [id number]    Date/Time of service: 02/17/2021  10:18 AM       Subjective:     Chief Complaint:  none    Patient sitting up in bed, no complaints today.       Objective:       Vitals:       Last 24hrs VS reviewed since prior progress note. Most recent are:    Visit Vitals  BP (!) 108/47   Pulse 89   Temp 97.6 ??F (36.4 ??C)   Resp 20   Ht 5\' 5"  (1.651 m)   SpO2 94%   BMI 31.62 kg/m??     SpO2 Readings from Last 6 Encounters:   02/17/21 94%   02/15/21 98%   02/10/21 99%   07/03/20 94%    O2 Flow Rate (L/min): 0 l/min       Intake/Output Summary (Last 24 hours) at 02/17/2021 0830  Last data filed at 02/17/2021 0800  Gross per 24 hour   Intake 889.25 ml   Output 475 ml   Net 414.25 ml        Exam:     Physical Exam:    Gen:  Well-developed, well-nourished, in no acute distress  HEENT:  Pink conjunctivae, PERRL, hearing intact to voice, moist mucous membranes  Neck:  Supple, without masses, thyroid non-tender  Resp:  No accessory muscle use, clear breath sounds without wheezes rales or rhonchi  Card:  No murmurs, normal S1, S2 without thrills, bruits or peripheral edema  Abd:  Soft, non-tender, non-distended, normoactive bowel sounds are present, no palpable organomegaly and no detectable hernias  Musc:  No cyanosis or clubbing  Skin:  No rashes or ulcers, skin turgor is good  Neuro:  Nonfocal, moves extremities  Psych:  Alert, oriented x 3      Medications Reviewed: (see below)    Lab Data Reviewed: (see below)    ______________________________________________________________________    Medications:     Current Facility-Administered Medications   Medication Dose Route Frequency   ??? insulin lispro (HUMALOG) injection   SubCUTAneous Q6H   ??? dextrose (D50) infusion 12.5-25 g  12.5-25 g IntraVENous PRN   ??? PHENYLephrine (NEO-SYNEPHRINE) 30 mg in 0.9%  sodium chloride 250 mL infusion  10-100 mcg/min IntraVENous TITRATE   ??? topiramate (TOPAMAX) tablet 50 mg  50 mg Oral QHS   ??? pravastatin (PRAVACHOL) tablet 40 mg  40 mg Oral QHS   ??? glucose chewable tablet 16 g  4 Tablet Oral PRN   ??? dextrose (D50) infusion 12.5-25 g  25-50 mL IntraVENous PRN   ??? glucagon (GLUCAGEN) injection 1 mg  1 mg IntraMUSCular PRN   ??? sodium chloride (NS) flush 5-40 mL  5-40 mL IntraVENous Q8H   ??? sodium chloride (NS) flush 5-40 mL  5-40 mL IntraVENous PRN   ??? acetaminophen (TYLENOL) tablet 650 mg  650 mg Oral Q6H PRN    Or   ??? acetaminophen (TYLENOL) suppository 650 mg  650 mg Rectal Q6H PRN   ??? polyethylene glycol (MIRALAX) packet 17 g  17 g Oral DAILY PRN   ??? ondansetron (ZOFRAN ODT) tablet 4 mg  4 mg Oral Q8H PRN    Or   ??? ondansetron (ZOFRAN) injection 4 mg  4  mg IntraVENous Q6H PRN   ??? enoxaparin (LOVENOX) injection 40 mg  40 mg SubCUTAneous DAILY   ??? NOREPINephrine (LEVOPHED) 8 mg in 5% dextrose (32 mcg/mL) infusion  0.5-16 mcg/min IntraVENous TITRATE          Lab Review:     Recent Labs     02/17/21  0305 02/16/21  0245 02/15/21  1326   WBC 7.5 7.2 7.6   HGB 11.8 12.1 14.7   HCT 38.2 37.6 45.0   PLT 155 159 175     Recent Labs     02/17/21  0305 02/16/21  0245 02/15/21  2043 02/15/21  1326   NA 141 143  --  136   K 3.6 3.2*  --  3.9   CL 110* 111*  --  100   CO2 22 23  --  25   GLU 96 147*  --  217*   BUN 12 13  --  20   CREA 0.81 0.80  --  1.01   CA 8.0* 7.8*  --  9.0   MG  --   --  2.0  --    ALB  --  2.9*  --  3.6   TBILI  --  0.4  --  0.6   ALT  --  37  --  52     Lab Results   Component Value Date/Time    Glucose (POC) 97 02/17/2021 05:24 AM    Glucose (POC) 98 02/16/2021 11:05 PM    Glucose (POC) 131 (H) 02/16/2021 04:36 PM    Glucose (POC) 134 (H) 02/16/2021 11:33 AM    Glucose (POC) 142 (H) 02/16/2021 06:40 AM          Assessment / Plan:     1. Intentional drug overdose in setting of severe depression - psych c/s, poison control called from O[facility] ER, rec monitor for 6  hrs; initial EKG with normal qt but repeat qtc up to 561 prior to transfer, repeat EKG on arrival here with qtc 505; check mag, give 2g mag, for bicarb 1 amp pushes until qrs  <120, EKG on arrival here with qrs 90; s/p intubated 2/17 per anesthesia for airway protection, extubated 2/18  2. Bipolar 1 disorder - psych c/s  3. Hypotension - suspect sec to med effect, on pressors  4. Recent covid 19 detected - 2/12; repeat 2/17 negative, PCR pos  5. HTN  6. Obesity - BMI 31.62  7. DM2 - ssi  8. Migraines - cont home regimen  9. VTE prophylaxis - lovenox  10. Dispo - stable for transfer to med/surg    Attending Physician: Thermon Leyland, DO

## 2021-02-17 NOTE — Progress Notes (Signed)
Bedside and Verbal shift change report given to Ronnie,RN (oncoming nurse) by Linda,RN (offgoing nurse). Report included the following information SBAR, Kardex, Intake/Output, MAR, Recent Results and Med Rec Status.

## 2021-02-17 NOTE — Progress Notes (Signed)
Patient arrived to floor. Sitter at bedside. No needs at this time. Will endorse assessment and skin check to next nurse. Will monitor closely.

## 2021-02-17 NOTE — Progress Notes (Signed)
Progress Note  Date:02/17/2021       Room:3015/01  Patient Name:Mallory Bonilla     Date of Birth:[birth year]     Age:55 y.o.      Subjective      CC: wanting to go home    24 HOUR: extubated.  No new events.    Objective         Vitals Last 24 Hours:  TEMPERATURE:  Temp  Avg: 98.4 ??F (36.9 ??C)  Min: 97.6 ??F (36.4 ??C)  Max: 99.6 ??F (37.6 ??C)  RESPIRATIONS RANGE: Resp  Avg: 17.7  Min: 10  Max: 25  PULSE OXIMETRY RANGE: SpO2  Avg: 97.1 %  Min: 93 %  Max: 100 %  PULSE RANGE: Pulse  Avg: 101.2  Min: 88  Max: 129  BLOOD PRESSURE RANGE: Systolic (96GEZ), MOQ:947 , Min:69 , MLY:650   ; Diastolic (35WSF), KCL:27, Min:45, Max:73    I/O (24Hr):    Intake/Output Summary (Last 24 hours) at 02/17/2021 1233  Last data filed at 02/17/2021 1200  Gross per 24 hour   Intake 884.83 ml   Output 400 ml   Net 484.83 ml     Objective:  Vital signs: (most recent): Blood pressure (!) 110/51, pulse 95, temperature 97.7 ??F (36.5 ??C), resp. rate 20, height '5\' 5"'  (1.651 m), SpO2 93 %.       Gen: awake, alert, interactive  HEENT: NCAT  Chest: symmetrical chest rise  CV: r/r/r  Abd: non-distended  Ext: no c/c/c  Neuro: moves all ext.  Follows commands   Labs/Imaging/Diagnostics    Labs:  CBC:  Recent Labs     02/17/21  0305 02/16/21  0245 02/15/21  1326   WBC 7.5 7.2 7.6   RBC 4.51 4.57 5.53   HGB 11.8 12.1 14.7   HCT 38.2 37.6 45.0   MCV 84.7 82.3 81.4   RDW 14.6* 14.3 15.2*   PLT 155 159 175     CHEMISTRIES:  Recent Labs     02/17/21  0305 02/16/21  0245 02/15/21  2043 02/15/21  1326   NA 141 143  --  136   K 3.6 3.2*  --  3.9   CL 110* 111*  --  100   CO2 22 23  --  25   BUN 12 13  --  20   CA 8.0* 7.8*  --  9.0   MG  --   --  2.0  --    PT/INR:No results for input(s): INR, INREXT, INREXT in the last 72 hours.    No lab exists for component: PROTIME  APTT:No results for input(s): APTT in the last 72 hours.  LIVER PROFILE:  Recent Labs     02/16/21  0245 02/15/21  1326   AST 28 53*   ALT 37 52     Lab Results   Component  Value Date/Time    ALT (SGPT) 37 02/16/2021 02:45 AM    AST (SGOT) 28 02/16/2021 02:45 AM    Alk. phosphatase 80 02/16/2021 02:45 AM    Bilirubin, total 0.4 02/16/2021 02:45 AM       Imaging Last 24 Hours:  No results found.  Assessment//Plan     55 y/o with hx HTN, HLD, DM, depression, bipolar admitted after transfer from outside ER for mgmt of polysubs OD  ??  Polysubstance OD:  -- Poison control involved at outside hospital  -- continue supportive care  -- monitor QTc and consider bicarb if becoming prolonged.    --  amps of bicarb for QRS >120 and Mg for QT >500  -- improved and unlikely to need above tx at this point  ??  Hypotension:  -- secondary to polysubs OD  -- pressors as needed for MAP goal >65  -- on neo, but anticipate continued weaning.   ??  Encephalopathy:  -- acute tox  -- secondary to above  -- no indication of CNS infection or event.    -- improved  ??  SI:  -- psych consult  -- suicide precautions.   -- see note 2/19 regarding plan when pt medically cleared.  ??  CCM time 35 min.  Pt at risk of life threatening deterioration from polysubstance OD.    ??  Pt seen and evaluated via tele interaction.  Audio and video used for this encounter.

## 2021-02-18 LAB — POCT GLUCOSE
POC Glucose: 114 mg/dL (ref 65–117)
POC Glucose: 112 mg/dL (ref 65–117)
POC Glucose: 111 mg/dL (ref 65–117)
POC Glucose: 126 mg/dL — ABNORMAL HIGH (ref 65–117)

## 2021-02-18 LAB — GLUCOSE, POC
Glucose (POC): 114 mg/dL (ref 65–117)
Glucose (POC): 112 mg/dL (ref 65–117)
Glucose (POC): 111 mg/dL (ref 65–117)
Glucose (POC): 126 mg/dL — ABNORMAL HIGH (ref 65–117)

## 2021-02-18 MED ORDER — CETIRIZINE 10 MG TAB
10 mg | Freq: Every day | ORAL | Status: DC
Start: 2021-02-18 — End: 2021-02-20
  Administered 2021-02-18 – 2021-02-20 (×3): via ORAL

## 2021-02-18 MED FILL — PRAVASTATIN 20 MG TAB: 20 mg | ORAL | Qty: 2

## 2021-02-18 MED FILL — CETIRIZINE 10 MG TAB: 10 mg | ORAL | Qty: 1

## 2021-02-18 MED FILL — ENOXAPARIN 40 MG/0.4 ML SUB-Q SYRINGE: 40 mg/0.4 mL | SUBCUTANEOUS | Qty: 0.4

## 2021-02-18 MED FILL — TOPIRAMATE 25 MG TAB: 25 mg | ORAL | Qty: 2

## 2021-02-18 NOTE — Progress Notes (Signed)
Bedside and Verbal shift change report given to Chanetta Marshall (Cabin crew) by Christen Bame (offgoing nurse). Report included the following information SBAR, Kardex, ED Summary, Procedure Summary, Intake/Output, MAR and Recent ResultsPt's need to be 1:1 for suicide precautions .

## 2021-02-18 NOTE — Progress Notes (Signed)
[place] [facility]  [address] [place], [place] [address]  [phone]      Medical Progress Note      NAME: Mallory Bonilla   DOB:  Oct 11, 1966  MRM:  [id number]    Date/Time of service: 02/18/2021  10:18 AM       Subjective:     Chief Complaint:  none    Patient on phone at time of encounter. Sitter present, states patient has been doing well.       Objective:       Vitals:       Last 24hrs VS reviewed since prior progress note. Most recent are:    Visit Vitals  BP 120/65 (BP 1 Location: Left upper arm, BP Patient Position: At rest)   Pulse 97   Temp 98.1 ??F (36.7 ??C)   Resp 18   Ht 5\' 5"  (1.651 m)   Wt 80.5 kg (177 lb 7.5 oz)   SpO2 97%   BMI 29.53 kg/m??     SpO2 Readings from Last 6 Encounters:   02/18/21 97%   02/15/21 98%   02/10/21 99%   07/03/20 94%    O2 Flow Rate (L/min): 0 l/min       Intake/Output Summary (Last 24 hours) at 02/18/2021 1220  Last data filed at 02/18/2021 1011  Gross per 24 hour   Intake 108.92 ml   Output 550 ml   Net -441.08 ml        Exam:     Physical Exam:    Gen:  Well-developed, well-nourished, in no acute distress  HEENT:  Pink conjunctivae, PERRL, hearing intact to voice, moist mucous membranes  Neck:  Supple, without masses, thyroid non-tender  Resp:  No accessory muscle use, clear breath sounds without wheezes rales or rhonchi  Card:  No murmurs, normal S1, S2 without thrills, bruits or peripheral edema  Abd:  Soft, non-tender, non-distended, normoactive bowel sounds are present, no palpable organomegaly and no detectable hernias  Musc:  No cyanosis or clubbing  Skin:  No rashes or ulcers, skin turgor is good  Neuro:  Nonfocal, moves extremities  Psych:  Alert, oriented x 3    Medications Reviewed: (see below)    Lab Data Reviewed: (see below)    ______________________________________________________________________    Medications:     Current Facility-Administered Medications   Medication Dose Route Frequency   ??? insulin lispro (HUMALOG) injection    SubCUTAneous AC&HS   ??? dextrose (D50) infusion 12.5-25 g  12.5-25 g IntraVENous PRN   ??? PHENYLephrine (NEO-SYNEPHRINE) 30 mg in 0.9% sodium chloride 250 mL infusion  10-100 mcg/min IntraVENous TITRATE   ??? topiramate (TOPAMAX) tablet 50 mg  50 mg Oral QHS   ??? pravastatin (PRAVACHOL) tablet 40 mg  40 mg Oral QHS   ??? glucose chewable tablet 16 g  4 Tablet Oral PRN   ??? dextrose (D50) infusion 12.5-25 g  25-50 mL IntraVENous PRN   ??? glucagon (GLUCAGEN) injection 1 mg  1 mg IntraMUSCular PRN   ??? sodium chloride (NS) flush 5-40 mL  5-40 mL IntraVENous Q8H   ??? sodium chloride (NS) flush 5-40 mL  5-40 mL IntraVENous PRN   ??? acetaminophen (TYLENOL) tablet 650 mg  650 mg Oral Q6H PRN    Or   ??? acetaminophen (TYLENOL) suppository 650 mg  650 mg Rectal Q6H PRN   ??? polyethylene glycol (MIRALAX) packet 17 g  17 g Oral DAILY PRN   ??? ondansetron (ZOFRAN  ODT) tablet 4 mg  4 mg Oral Q8H PRN    Or   ??? ondansetron (ZOFRAN) injection 4 mg  4 mg IntraVENous Q6H PRN   ??? enoxaparin (LOVENOX) injection 40 mg  40 mg SubCUTAneous DAILY   ??? NOREPINephrine (LEVOPHED) 8 mg in 5% dextrose (32 mcg/mL) infusion  0.5-16 mcg/min IntraVENous TITRATE          Lab Review:     Recent Labs     02/17/21  0305 02/16/21  0245 02/15/21  1326   WBC 7.5 7.2 7.6   HGB 11.8 12.1 14.7   HCT 38.2 37.6 45.0   PLT 155 159 175     Recent Labs     02/17/21  0305 02/16/21  0245 02/15/21  2043 02/15/21  1326   NA 141 143  --  136   K 3.6 3.2*  --  3.9   CL 110* 111*  --  100   CO2 22 23  --  25   GLU 96 147*  --  217*   BUN 12 13  --  20   CREA 0.81 0.80  --  1.01   CA 8.0* 7.8*  --  9.0   MG  --   --  2.0  --    ALB  --  2.9*  --  3.6   TBILI  --  0.4  --  0.6   ALT  --  37  --  52     Lab Results   Component Value Date/Time    Glucose (POC) 111 02/18/2021 11:09 AM    Glucose (POC) 112 02/18/2021 06:35 AM    Glucose (POC) 114 02/17/2021 09:36 PM    Glucose (POC) 132 (H) 02/17/2021 04:27 PM    Glucose (POC) 128 (H) 02/17/2021 10:40 AM          Assessment / Plan:      1. Intentional drug overdose in setting of severe depression - psych c/s, poison control called from OSH ER, rec monitor for 6 hrs; initial EKG with normal qt but repeat qtc up to 561 prior to transfer, repeat EKG on arrival here with qtc 505; check mag, give 2g mag, for bicarb 1 amp pushes until qrs  <120, EKG on arrival here with qrs 90; s/p intubated 2/17 per anesthesia for airway protection, extubated 2/18  2. Bipolar 1 disorder - psych following, for inpatient psych placement  3. Hypotension - suspect sec to med effect, on pressors  4. Recent covid 19 detected - 2/12; repeat 2/17 negative, PCR pos  5. HTN  6. Overweight - BMI 29.53  7. DM2 - ssi  8. Migraines - cont home regimen  9. VTE prophylaxis - lovenox  10. Dispo - plan to dc to inpatient psych    Attending Physician: Thermon Leyland, DO

## 2021-02-18 NOTE — Progress Notes (Signed)
Spiritual Care Assessment/Progress Note  [facility]      NAME: Mallory Bonilla      MRN: [id number]  AGE: 55 y.o. SEX: female  Religious Affiliation: No preference   Language: English     02/18/2021     Total Time (in minutes): 10     Spiritual Assessment begun in [facility] POST SURG ORT 2 through conversation with:         [] Patient        []  Family    []  Friend(s)        Reason for Consult: Initial visit     Spiritual beliefs: (Please include comment if needed)     []  Identifies with a faith tradition:         []  Supported by a faith community:            []  Claims no spiritual orientation:           []  Seeking spiritual identity:                []  Adheres to an individual form of spirituality:           [x]  Not able to assess:                           Identified resources for coping:      []  Prayer                               []  Music                  []  Guided Imagery     []  Family/friends                 []  Pet visits     []  Devotional reading                         [x]  Unknown     []  Other:                                              Interventions offered during this visit: (See comments for more details)    Patient Interventions: Initial visit           Plan of Care:     []  Support spiritual and/or cultural needs    []  Support AMD and/or advance care planning process      []  Support grieving process   []  Coordinate Rites and/or Rituals    []  Coordination with community clergy   []  No spiritual needs identified at this time   []  Detailed Plan of Care below (See Comments)  []  Make referral to Music Therapy  []  Make referral to Pet Therapy     []  Make referral to Addiction services  []  Make referral to [facility]  []  Make referral to Spiritual Care Partner  []  No future visits requested        [x]  Contact Spiritual Care for further referrals     Attempted to visit pt for initial spiritual assessment. Unable to complete assessment at this time as she is on Covid isolation precautions. A call to  her room phone went unanswered. Pt's chart was consulted.  Clancy Gourd, Chaplain, MDiv, MS, [facility]

## 2021-02-19 LAB — POCT GLUCOSE
POC Glucose: 126 mg/dL — ABNORMAL HIGH (ref 65–117)
POC Glucose: 127 mg/dL — ABNORMAL HIGH (ref 65–117)
POC Glucose: 130 mg/dL — ABNORMAL HIGH (ref 65–117)
POC Glucose: 106 mg/dL (ref 65–117)

## 2021-02-19 LAB — GLUCOSE, POC
Glucose (POC): 126 mg/dL — ABNORMAL HIGH (ref 65–117)
Glucose (POC): 127 mg/dL — ABNORMAL HIGH (ref 65–117)
Glucose (POC): 130 mg/dL — ABNORMAL HIGH (ref 65–117)
Glucose (POC): 106 mg/dL (ref 65–117)

## 2021-02-19 MED FILL — ACETAMINOPHEN 325 MG TABLET: 325 mg | ORAL | Qty: 2

## 2021-02-19 MED FILL — ENOXAPARIN 40 MG/0.4 ML SUB-Q SYRINGE: 40 mg/0.4 mL | SUBCUTANEOUS | Qty: 0.4

## 2021-02-19 MED FILL — CETIRIZINE 10 MG TAB: 10 mg | ORAL | Qty: 1

## 2021-02-19 MED FILL — PRAVASTATIN 20 MG TAB: 20 mg | ORAL | Qty: 2

## 2021-02-19 MED FILL — TOPIRAMATE 25 MG TAB: 25 mg | ORAL | Qty: 2

## 2021-02-19 NOTE — Progress Notes (Signed)
Bedside and Verbal shift change report given to Chanetta Marshall (Cabin crew) by Christen Bame (offgoing nurse). Report included the following information SBAR, Kardex, ED Summary, Procedure Summary, Intake/Output, MAR and Recent Results.

## 2021-02-19 NOTE — Progress Notes (Signed)
02/19/2021 4:23 PM CM called to [facility] bed [phone]), spoke with [facility] regarding pt's need for inpatient psych. [facility] reported pt will need to be 10 full days from 1st COVID test, CM relayed 1st COVID was on 2/12. CM also relayed pt was tested again on 2/17, [facility] reported they will only go off of pt's 1st test.   [facility] reported a new psych eval will be needed for pt and they will follow. CM requested psych consult from pt's attending earlier this AM. Order received awaiting psych eval.  CM will follow. Fredric Mare, BSW     Care Management Progress Note    No diagnosis found.    RUR:  11%  Risk Level: [] Low [] Moderate [] High  Value-based purchasing: []  Yes []  No  Bundle patient: []  Yes []  No   Specify:     Transition of care plan:  1. Pt needing inpatient psych placement, pt will need to be 10 days past quarintine prior to being considered for inpatient psych admission. Pt's 1st COVID test was on 2/12  2. [facility] bed board is following pt   3. Will need new psych eval, order placed this AM  4. Outpatient follow-up.  5. Likely will need stretcher transport for 1:1 during transport

## 2021-02-19 NOTE — Progress Notes (Signed)
[place] [facility]  [address] [place], [place] [address]  [phone]      Medical Progress Note      NAME: Mallory Bonilla   DOB:  01-Mar-1966  MRM:  [id number]    Date/Time of service: 02/19/2021  10:18 AM       Subjective:     Chief Complaint:  none    Patient laying in bed, sitter present. No new complaints.       Objective:       Vitals:       Last 24hrs VS reviewed since prior progress note. Most recent are:    Visit Vitals  BP 128/73 (BP 1 Location: Left upper arm, BP Patient Position: At rest)   Pulse 91   Temp 97.8 ??F (36.6 ??C)   Resp 18   Ht 5\' 5"  (1.651 m)   Wt 80.5 kg (177 lb 7.5 oz)   SpO2 96%   BMI 29.53 kg/m??     SpO2 Readings from Last 6 Encounters:   02/19/21 96%   02/15/21 98%   02/10/21 99%   07/03/20 94%    O2 Flow Rate (L/min): 0 l/min       Intake/Output Summary (Last 24 hours) at 02/19/2021 0756  Last data filed at 02/19/2021 0333  Gross per 24 hour   Intake 1240 ml   Output ???   Net 1240 ml        Exam:     Physical Exam:    Gen:  Well-developed, well-nourished, in no acute distress  HEENT:  Pink conjunctivae, PERRL, hearing intact to voice, moist mucous membranes  Neck:  Supple, without masses, thyroid non-tender  Resp:  No accessory muscle use, clear breath sounds without wheezes rales or rhonchi  Card:  No murmurs, normal S1, S2 without thrills, bruits or peripheral edema  Abd:  Soft, non-tender, non-distended, normoactive bowel sounds are present, no palpable organomegaly and no detectable hernias  Musc:  No cyanosis or clubbing  Skin:  No rashes or ulcers, skin turgor is good  Neuro:  Nonfocal, moves extremities  Psych:  Alert, oriented x 3    Medications Reviewed: (see below)    Lab Data Reviewed: (see below)    ______________________________________________________________________    Medications:     Current Facility-Administered Medications   Medication Dose Route Frequency   ??? cetirizine (ZYRTEC) tablet 10 mg  10 mg Oral DAILY   ??? insulin lispro (HUMALOG)  injection   SubCUTAneous AC&HS   ??? dextrose (D50) infusion 12.5-25 g  12.5-25 g IntraVENous PRN   ??? topiramate (TOPAMAX) tablet 50 mg  50 mg Oral QHS   ??? pravastatin (PRAVACHOL) tablet 40 mg  40 mg Oral QHS   ??? glucose chewable tablet 16 g  4 Tablet Oral PRN   ??? dextrose (D50) infusion 12.5-25 g  25-50 mL IntraVENous PRN   ??? glucagon (GLUCAGEN) injection 1 mg  1 mg IntraMUSCular PRN   ??? sodium chloride (NS) flush 5-40 mL  5-40 mL IntraVENous Q8H   ??? sodium chloride (NS) flush 5-40 mL  5-40 mL IntraVENous PRN   ??? acetaminophen (TYLENOL) tablet 650 mg  650 mg Oral Q6H PRN    Or   ??? acetaminophen (TYLENOL) suppository 650 mg  650 mg Rectal Q6H PRN   ??? polyethylene glycol (MIRALAX) packet 17 g  17 g Oral DAILY PRN   ??? ondansetron (ZOFRAN ODT) tablet 4 mg  4 mg Oral Q8H PRN  Or   ??? ondansetron (ZOFRAN) injection 4 mg  4 mg IntraVENous Q6H PRN   ??? enoxaparin (LOVENOX) injection 40 mg  40 mg SubCUTAneous DAILY          Lab Review:     Recent Labs     02/17/21  0305   WBC 7.5   HGB 11.8   HCT 38.2   PLT 155     Recent Labs     02/17/21  0305   NA 141   K 3.6   CL 110*   CO2 22   GLU 96   BUN 12   CREA 0.81   CA 8.0*     Lab Results   Component Value Date/Time    Glucose (POC) 127 (H) 02/19/2021 07:03 AM    Glucose (POC) 126 (H) 02/18/2021 08:21 PM    Glucose (POC) 126 (H) 02/18/2021 04:30 PM    Glucose (POC) 111 02/18/2021 11:09 AM    Glucose (POC) 112 02/18/2021 06:35 AM          Assessment / Plan:     1. Intentional drug overdose in setting of severe depression - psych c/s, poison control called from OSH ER, rec monitor for 6 hrs; initial EKG with normal qt but repeat qtc up to 561 prior to transfer, repeat EKG on arrival here with qtc 505; s/p intubated 2/17 per anesthesia for airway protection, extubated 2/18  2. Bipolar 1 disorder - psych following, for inpatient psych placement  3. Hypotension - suspect sec to med effect, on pressors  4. Recent covid 19 detected - 2/12; repeat 2/17 negative, PCR pos  5. HTN  6.  Overweight - BMI 29.53  7. DM2 - ssi  8. Migraines - cont home regimen  9. VTE prophylaxis - lovenox  10. Dispo - plan to dc to inpatient psych, 10 day quarantine up 2/22    Attending Physician: Thermon Leyland, DO

## 2021-02-19 NOTE — Progress Notes (Signed)
Comprehensive Nutrition Assessment    Type and Reason for Visit: Reassess    Nutrition Recommendations/Plan:   1. Continue 4 Carb choice  2. Adjust bowel regimen - no BM since admission (4 days+)  3. Get standing scale weight    Nutrition Assessment:    Pt is a 55 year old female admitted with Intentional drug overdose; Suicide attempt. She has a past medical history of Anxiety, Arthritis, Bipolar 1 disorder, Diabetes mellitus type 2, Hypercholesterolemia, Hyperlipidemia, Hypertension, and Hypertension.     Follow up. Pt extubated 2/18. Diet advanced and pt tolerating without reported issues. Noted weight down from 190 lbs to 177 lbs over 3 days. Likely bed scale error. If able, obtain standing scale weight. Currently awaiting 10 days out from COVID dx. Likely to move to inpt psych per discussion/notes. Pt needs to be sitter free and out of COVID window which will be tomorrow. Monitor BG. A1c 6.2. No known food allergies. No chew/swallow difficulties.     Wt Readings from Last 10 Encounters:   02/18/21 80.5 kg (177 lb 7.5 oz)   02/15/21 86.2 kg (190 lb)   02/09/21 86.5 kg (190 lb 11.2 oz)   01/11/21 85.6 kg (188 lb 12.8 oz)   07/03/20 90.7 kg (200 lb)   03/27/20 90.9 kg (200 lb 6.4 oz)       Malnutrition Assessment:  Malnutrition Status:  Insufficient data  - unable to physically assess patient at this time  Context:  Acute illness     Findings of the 6 clinical characteristics of malnutrition:   Energy Intake:  Unable to assess  Weight Loss:  No significant weight loss     Body Fat Loss:  Unable to assess,     Muscle Mass Loss:  Unable to assess,    Fluid Accumulation:  No significant fluid accumulation,    Grip Strength:  Not performed     Estimated Daily Nutrient Needs:  Energy (kcal): 1819; Weight Used for Energy Requirements: Current  Protein (g): 81; Weight Used for Protein Requirements: Current  Fluid (ml/day): 1825; Method Used for Fluid Requirements: 1 ml/kcal    Nutrition Related Findings:       ABD:  active   Last BM:  (PTA),   Edema: No edema    Nutr. Meds:  Current Facility-Administered Medications   Medication Dose Route Frequency   . cetirizine (ZYRTEC) tablet 10 mg  10 mg Oral DAILY   . insulin lispro (HUMALOG) injection   SubCUTAneous AC&HS   . topiramate (TOPAMAX) tablet 50 mg  50 mg Oral QHS   . pravastatin (PRAVACHOL) tablet 40 mg  40 mg Oral QHS   . sodium chloride (NS) flush 5-40 mL  5-40 mL IntraVENous Q8H   . enoxaparin (LOVENOX) injection 40 mg  40 mg SubCUTAneous DAILY       Wounds:    None       Current Nutrition Therapies:  ADULT DIET Regular; 4 carb choices (60 gm/meal)  DIET ONE TIME MESSAGE    Anthropometric Measures:   Height:  5\' 5"  (165.1 cm)   Current Body Wt:  80.3 kg (177 lb)    Admission Body Wt:  190 lb     Usual Body Wt:  Unknown    Ideal Body Wt:  125 lbs:  141.6 %   Body mass index is 29.53 kg/m.   BMI Category:  Obese class 1 (BMI 30.0-34.9)       Nutrition Diagnosis:    Inadequate oral intake related  to impaired respiratory function as evidenced by intubation,NPO or clear liquid status due to medical condition     Increased nutrient needs related to catabolic illness,impaired respiratory function as evidenced by  (COVID-19)      Nutrition Interventions:   Food and/or Nutrient Delivery: Continue current diet  Nutrition Education and Counseling: No recommendations at this time  Coordination of Nutrition Care: Continue to monitor while inpatient,Interdisciplinary rounds    Goals:  PO >75% of meals with wt maintenance within 5-7 days       Nutrition Monitoring and Evaluation:   Behavioral-Environmental Outcomes: None identified  Food/Nutrient Intake Outcomes: Supplement intake,Food and nutrient intake  Physical Signs/Symptoms Outcomes: Biochemical data,Weight    Discharge Planning:    Continue current diet     Electronically signed by Jannet Mantis, RD on 02/19/2021  Contact: [phone] or via [employer]

## 2021-02-20 DIAGNOSIS — F332 Major depressive disorder, recurrent severe without psychotic features: Secondary | ICD-10-CM

## 2021-02-20 LAB — POCT GLUCOSE
POC Glucose: 147 mg/dL — ABNORMAL HIGH (ref 65–117)
POC Glucose: 179 mg/dL — ABNORMAL HIGH (ref 65–117)
POC Glucose: 157 mg/dL — ABNORMAL HIGH (ref 65–117)
POC Glucose: 127 mg/dL — ABNORMAL HIGH (ref 65–117)

## 2021-02-20 LAB — GLUCOSE, POC
Glucose (POC): 147 mg/dL — ABNORMAL HIGH (ref 65–117)
Glucose (POC): 179 mg/dL — ABNORMAL HIGH (ref 65–117)
Glucose (POC): 157 mg/dL — ABNORMAL HIGH (ref 65–117)
Glucose (POC): 127 mg/dL — ABNORMAL HIGH (ref 65–117)

## 2021-02-20 MED ORDER — TEMAZEPAM 15 MG CAP
15 mg | Freq: Every evening | ORAL | Status: AC
Start: 2021-02-20 — End: 2021-02-20
  Administered 2021-02-20: 07:00:00 via ORAL

## 2021-02-20 MED ORDER — GUAIFENESIN 100 MG/5 ML ORAL LIQUID
100 mg/5 mL | ORAL | Status: DC | PRN
Start: 2021-02-20 — End: 2021-02-20
  Administered 2021-02-20: 07:00:00 via ORAL

## 2021-02-20 MED FILL — ENOXAPARIN 40 MG/0.4 ML SUB-Q SYRINGE: 40 mg/0.4 mL | SUBCUTANEOUS | Qty: 0.4

## 2021-02-20 MED FILL — CETIRIZINE 10 MG TAB: 10 mg | ORAL | Qty: 1

## 2021-02-20 MED FILL — TEMAZEPAM 15 MG CAP: 15 mg | ORAL | Qty: 1

## 2021-02-20 MED FILL — PRAVASTATIN 20 MG TAB: 20 mg | ORAL | Qty: 2

## 2021-02-20 MED FILL — TOPIRAMATE 25 MG TAB: 25 mg | ORAL | Qty: 2

## 2021-02-20 MED FILL — GUAIFENESIN 100 MG/5 ML ORAL LIQUID: 100 mg/5 mL | ORAL | Qty: 10

## 2021-02-20 NOTE — Progress Notes (Signed)
Physician Progress Note      PATIENT:               Mallory Bonilla, Mallory Bonilla  CSN #:                  [id number]  DOB:                       1966/09/04  ADMIT DATE:       02/15/2021 5:28 PM  DISCH DATE:  RESPONDING  PROVIDER #:        Roslyn Smiling Naija Troost MD          QUERY TEXT:    good afternoon  Pt admitted with intentional drug overdose and has respiratory failure documented.  If possible, please document in progress notes and discharge summary further specificity regarding the type and acuity of respiratory failure:    The medical record reflects the following:  Risk Factors: overdose, obesity, Covid+ PCR, recent covid diagnosis, encephalopathy  Clinical Indicators: intubated and placed on vent  Treatment: intubated and placed on ventilator, CXR, intensivist consult, ICU care, daily labs    Thank you  Katina Degree RN CDI  [phone]  Options provided:  -- Acute respiratory failure with hypoxia  -- Acute respiratory failure with hypercapnia  -- Other - I will add my own diagnosis  -- Disagree - Not applicable / Not valid  -- Disagree - Clinically unable to determine / Unknown  -- Refer to Clinical Documentation Reviewer    PROVIDER RESPONSE TEXT:    Provider is clinically unable to determine a response to this query.    Query created by: Katina Degree on 02/20/2021 2:44 PM      Electronically signed by:  Sharlett Iles MD 02/20/2021 4:01 PM

## 2021-02-20 NOTE — Progress Notes (Signed)
Attempted to schedule hospital follow up with PCP Catarina Hartshorn, NP just got voice mail did not leave message will try back later.

## 2021-02-20 NOTE — Progress Notes (Signed)
Report given to RN Asher Muir at [facility].   PIV removed by RN Supervisor [place].  Belongings collected and taken by [facility] staff.

## 2021-02-20 NOTE — Discharge Summary (Signed)
Discharge  Summary by Sharlett Iles, MD at 02/20/21 1154                Author: Sharlett Iles, MD  Service: Hospitalist  Author Type: Physician       Filed: 02/20/21 1157  Date of Service: 02/20/21 1154  Status: Signed          Editor: Sharlett Iles, MD (Physician)                                       Hospitalist Discharge Summary        Patient ID:   Mallory Bonilla   [id number]   55 y.o.   [birth year]   02/15/2021      PCP on record: Mallory Hartshorn, NP      Admit date: 02/15/2021   Discharge date and time: 02/20/2021      DISCHARGE DIAGNOSIS:   Suicidal attempt with intentional drug overdose   Respiratory failure from overdose needing mechanical ventilation   Hypertension blood pressure stable without meds   Diabetes type 2   Mild obesity   Bipolar disorder      CONSULTATIONS:   IP CONSULT TO INTENSIVIST   IP CONSULT TO PSYCHIATRY   IP CONSULT TO PSYCHIATRY      Excerpted HPI from H&P of Mallory Bhateja, DO:      CHIEF COMPLAINT: suicide attempt   ??   HISTORY OF PRESENT ILLNESS:      Mallory Bonilla is a 55 y.o.  female who is admitted from [facility] ER to ICU for intentional drug overdose. She has history of suicide attempt and was in their ER for days prior to this admission awaiting psych placement however voiced  that she no longer planned to harm herself and was discharged home. Per ER notes, she overdosed on klonopin, doxepin, hydroxyzine. Poison control was notified and recommended monitoring for 6 hours, labs, and EKG for QT eval. She was hypotensive refractory  to IVF and started on levo.   Chart review reveals she follows with [facility] and takes abilify 5mg , viibryd 40mg , clonazepam 0.5mg  bid, buspar 10mg  daily. She was diagnosed with covid recently as well.   ??   Patient unresponsive, snoring at time of evaluation. History obtained from chart, ER provider.      ______________________________________________________________________   DISCHARGE SUMMARY/HOSPITAL COURSE:   for full  details see H&P, daily progress notes, labs, consult notes.       1. Intentional drug overdose in setting of severe depression - psych c/s, poison control called from OSH ER, rec monitor for 6 hrs; initial EKG with normal qt but repeat qtc  up to 561 prior to transfer, repeat EKG on arrival here with qtc 505; s/p intubated 2/17 per anesthesia for airway protection, extubated 2/18.  Doing well on room air now.      2. Bipolar 1 disorder - psych following, for inpatient psych placement today at [facility].  Continue home meds adjustment as per psychiatric team      3. Hypotension - suspect sec to med effect, resolved off pressors   4. Recent covid 19 detected - 2/12; repeat 2/17 negative, DC isolation today   5. HTN-stable off meds f/u PCP    6. Overweight - BMI 29.53   7. DM2 - ssi and cont home meds   8. Migraines - cont home regimen  VTE prophylaxis - lovenox   Dispo - plan to dc to inpatient psych at [facility]  today 2/22         _______________________________________________________________________   Patient seen and examined by me on discharge day.  Patient had been in isolation for 10 days.  Patient is medically stable.  Patient needs psychiatric inpatient treatment.  If bed available can be transferred today to psych ward at [facility]   No other acute issues medically stable for discharge.  Patient voluntarily agreed to get transferred to psych ward.       _______________________________________________________________________   DISCHARGE MEDICATIONS:      Current Discharge Medication List              CONTINUE these medications which have NOT CHANGED          Details        ARIPiprazole (ABILIFY) 5 mg tablet  Take 1 Tablet by mouth nightly for 90 days.   Qty: 90 Tablet, Refills:  0          Associated Diagnoses: Depression, unspecified depression type               clonazePAM (KlonoPIN) 0.5 mg tablet  TAKE 1 TABLET BY MOUTH TWICE DAILY AS NEEDED FOR ANXIETY . DO NOT EXCEED 2 PER 24  HOURS   Qty: 60 Tablet, Refills:  0          Associated Diagnoses: Generalized anxiety disorder with panic attacks               busPIRone (BUSPAR) 10 mg tablet  Take 1 Tablet by mouth daily for 90 days.   Qty: 90 Tablet, Refills:  0               Viibryd 40 mg tab tablet  Take 1 tablet by mouth once daily   Qty: 90 Tablet, Refills:  0          Associated Diagnoses: Depression, unspecified depression type               topiramate (Topamax) 50 mg tablet  Take  by mouth nightly.               pravastatin (PRAVACHOL) 40 mg tablet  Take 40 mg by mouth nightly.               canagliflozin-metFORMIN (Invokamet) 150-500 mg tab  Take  by mouth two (2) times a day.               estradioL (ESTRACE) 2 mg tablet  Take  by mouth daily.               rizatriptan (MAXALT) 5 mg tablet  Take 5 mg by mouth once as needed for Migraine. May repeat in 2 hours if needed               dulaglutide (Trulicity) 0.75 mg/0.5 mL sub-q pen  0.75 mg by SubCUTAneous route every seven (7) days. Takes on Mondays               aspirin delayed-release 81 mg tablet  Take 81 mg by mouth daily.               naproxen sodium (Aleve) 220 mg tablet  Take 220 mg by mouth two (2) times daily as needed for Pain.                     STOP  taking these medications                  hydroCHLOROthiazide 25 mg tab 25 mg, lisinopriL 20 mg tab 20 mg  Comments:    Reason for Stopping:                      metoprolol tartrate (LOPRESSOR) 25 mg tablet  Comments:    Reason for Stopping:                                Patient Follow Up Instructions:     Diet cardiac   Activity as tolerated   Check CBC/BMP/mag in 1 to 2 weeks   Transfer to [facility] psych ward if bed available today   Return to ER or call MD if symptoms recur or get worse   Continue suicide precautions        Follow-up Information               Follow up With  Specialties  Details  Why  Contact Info              Mallory Hartshorn, NP  Nurse Practitioner  In 1 week  Post discharge from psychiatric  [address]   [place] [address]   [phone]                 Primary psychiatry as before                     ________________________________________________________________      Risk of deterioration: Low      Condition at Discharge:  Stable   __________________________________________________________________      Disposition psych ward at [facility] if bed available today          ____________________________________________________________________      Code Status: Full Code   ___________________________________________________________________         Total time in minutes spent coordinating this discharge  37  minutes      Signed:   Adalynd Donahoe Simona Huh, MD .
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
## 2021-02-20 NOTE — Progress Notes (Signed)
CM spoke with Angelique Blonder at [facility] bed board. [facility] psych is able to accept as contact precautions have been discontinued (initial COVID test was 2/12- Pt has been is isolation for 10 days). Angelique Blonder states that Pt will be admitted to room 725 at [facility]. Nurse asked to call report to [phone]. Pt will be followed by Ala Bent. Accepting MD for EMTALA will be Dr. Karie Mainland.     Transportation arranged through [facility]. Earliest they can transport is 8:15pm. Packet on chart. PCS sent to [facility] via [employer].    Medicare pt has received, reviewed, and signed 2nd IM letter informing them of their right to appeal the discharge.  Signed copied has been placed on pt bedside chart.    _____________________________________  Hoy Morn, BSW - Care Management  02/20/2021   12:32 PM    Care Management Interventions  PCP Verified by CM: Yes (Leane Rawlings,NP)  Transition of Care Consult (CM Consult): Other  Support Systems: Other (Comment)  Confirm Follow Up Transport: Family  Discharge Location  Patient Expects to be Discharged to:: Psychiatric Unit

## 2021-02-20 NOTE — Progress Notes (Signed)
[facility]  [address] [place], [place] [address]  [phone]      Medical Progress Note      NAME: Mallory Bonilla   DOB:  1966-12-19  MRM:  [id number]    Date/Time of service: 02/20/2021  10:18 AM       Subjective:     Chief Complaint:   Admitted with suicidal attempt    Patient seen and examined, chart was reviewed.  Case discussed with case management and according to infection prevention committee that she can come off isolation today.  Patient eager to go to [facility] psych ward today.  No other acute issues reported to me by patient or staff at this time.  No new labs to review today       Objective:       Vitals:       Last 24hrs VS reviewed since prior progress note. Most recent are:    Visit Vitals  BP 130/70 (BP 1 Location: Left upper arm, BP Patient Position: At rest)   Pulse 89   Temp 98.1 ??F (36.7 ??C)   Resp 18   Ht 5\' 5"  (1.651 m)   Wt 80.6 kg (177 lb 11.1 oz)   SpO2 94%   BMI 29.57 kg/m??     SpO2 Readings from Last 6 Encounters:   02/20/21 94%   02/15/21 98%   02/10/21 99%   07/03/20 94%    O2 Flow Rate (L/min): 0 l/min       Intake/Output Summary (Last 24 hours) at 02/20/2021 1147  Last data filed at 02/20/2021 02/22/2021  Gross per 24 hour   Intake 490 ml   Output ???   Net 490 ml        Exam:     Physical Exam:    Gen:  WDWN, in no acute distress  Resp:  No accessory muscle use,  Card:  RRR  Abd:   non-distended     Musc:  No cyanosis    Skin:  No rashes    Neuro:  Nonfocal, moves extremities  Psych:  Alert, oriented x 3         ______________________________________________________________________    Medications:     Current Facility-Administered Medications   Medication Dose Route Frequency   ??? guaiFENesin (ROBITUSSIN) 100 mg/5 mL oral liquid 200 mg  200 mg Oral Q4H PRN   ??? cetirizine (ZYRTEC) tablet 10 mg  10 mg Oral DAILY   ??? insulin lispro (HUMALOG) injection   SubCUTAneous AC&HS   ??? dextrose (D50) infusion 12.5-25 g  12.5-25 g IntraVENous PRN   ??? topiramate (TOPAMAX)  tablet 50 mg  50 mg Oral QHS   ??? pravastatin (PRAVACHOL) tablet 40 mg  40 mg Oral QHS   ??? glucose chewable tablet 16 g  4 Tablet Oral PRN   ??? dextrose (D50) infusion 12.5-25 g  25-50 mL IntraVENous PRN   ??? glucagon (GLUCAGEN) injection 1 mg  1 mg IntraMUSCular PRN   ??? sodium chloride (NS) flush 5-40 mL  5-40 mL IntraVENous Q8H   ??? sodium chloride (NS) flush 5-40 mL  5-40 mL IntraVENous PRN   ??? acetaminophen (TYLENOL) tablet 650 mg  650 mg Oral Q6H PRN    Or   ??? acetaminophen (TYLENOL) suppository 650 mg  650 mg Rectal Q6H PRN   ??? polyethylene glycol (MIRALAX) packet 17 g  17 g Oral DAILY PRN   ??? ondansetron (ZOFRAN ODT) tablet 4 mg  4 mg Oral Q8H PRN    Or   ??? ondansetron (ZOFRAN) injection 4 mg  4 mg IntraVENous Q6H PRN   ??? enoxaparin (LOVENOX) injection 40 mg  40 mg SubCUTAneous DAILY          Lab Review:     No results for input(s): WBC, HGB, HCT, PLT, HGBEXT, HCTEXT, PLTEXT, HGBEXT, HCTEXT, PLTEXT in the last 72 hours.  No results for input(s): NA, K, CL, CO2, GLU, BUN, CREA, CA, MG, PHOS, ALB, TBIL, TBILI, ALT, INR, INREXT, INREXT in the last 72 hours.    No lab exists for component: SGOT  Lab Results   Component Value Date/Time    Glucose (POC) 179 (H) 02/20/2021 06:01 AM    Glucose (POC) 147 (H) 02/19/2021 08:51 PM    Glucose (POC) 106 02/19/2021 04:18 PM    Glucose (POC) 130 (H) 02/19/2021 12:13 PM    Glucose (POC) 127 (H) 02/19/2021 07:03 AM          Assessment / Plan:     1. Intentional drug overdose in setting of severe depression - psych c/s, poison control called from OSH ER, rec monitor for 6 hrs; initial EKG with normal qt but repeat qtc up to 561 prior to transfer, repeat EKG on arrival here with qtc 505; s/p intubated 2/17 per anesthesia for airway protection, extubated 2/18.  Doing well on room air now.    2. Bipolar 1 disorder - psych following, for inpatient psych placement today at [facility]. [facility]    3. Hypotension - suspect sec to med effect, resolved off pressors  4. Recent covid 19 detected -  2/12; repeat 2/17 negative, DC isolation today  5. HTN-stable  6. Overweight - BMI 29.53  7. DM2 - ssi  8. Migraines - cont home regimen    VTE prophylaxis - lovenox  Dispo - plan to dc to inpatient psych at [facility] after 10 day quarantine which is  today 2/22    Attending Physician: Sharlett Iles, MD

## 2021-02-20 NOTE — Consults (Signed)
Psychiatric Consultation      DATE OF EVALUATION:  02/20/2021    ATTENDING PHYSICIAN:  Sharlett Iles, MD    REASON FOR REFERRAL:    Psychiatric consultation requested for Mallory Bonilla to evaluate patient  For suicide attempt.    HISTORY OF PRESENT ILLNESS:  The patient, Mallory Bonilla, is a 55 y.o. Caucasian female who presents inpatient at [facility] following recent suicide attempt via overdose. Patient presents with anxiety and reports her heart is racing. Patient states her overdose was intentional to end her life. Patient reports 5 recent deaths in her family and relationship problems with her partner. States hx of anxiety since age 89. Mallory Bonilla states increase in anxiety and depression. Rates current anxiety 6 of 10 and depression 8 of 10. Denies SI or plan at this time. Denies A/VH. States occasional ETOH use. Denies illicit substance use.     PAST MEDICAL HISTORY  Patient Active Problem List    Diagnosis Date Noted   ??? Intentional drug overdose (HCC) 02/15/2021   ??? Suicide attempt (HCC) 02/15/2021   ??? Headache 01/11/2021   ??? Hypertension 01/11/2021   ??? Diabetes mellitus type 2, controlled (HCC) 01/11/2021   ??? Anxiety 01/11/2021   ??? Hyperlipidemia 01/11/2021   ??? Bipolar 1 disorder (HCC) 01/11/2021     Past Medical History:   Diagnosis Date   ??? Anxiety 01/11/2021   ??? Arthritis    ??? Bipolar 1 disorder (HCC) 01/11/2021   ??? Diabetes (HCC)    ??? Diabetes mellitus type 2, controlled (HCC) 01/11/2021   ??? Headache    ??? Headache 01/11/2021   ??? Hypercholesterolemia    ??? Hyperlipidemia 01/11/2021   ??? Hypertension    ??? Hypertension 01/11/2021        MEDICATION PRIOR TO ADMISSION:  Prior to Admission medications    Medication Sig Start Date End Date Taking? Authorizing Provider   ARIPiprazole (ABILIFY) 5 mg tablet Take 1 Tablet by mouth nightly for 90 days. 02/12/21 05/13/21  Allen-Blaine, Marylene Land, NP   clonazePAM (KlonoPIN) 0.5 mg tablet TAKE 1 TABLET BY MOUTH TWICE DAILY AS NEEDED FOR ANXIETY . DO NOT EXCEED 2 PER 24 HOURS  02/09/21   Allen-Blaine, Marylene Land, NP   busPIRone (BUSPAR) 10 mg tablet Take 1 Tablet by mouth daily for 90 days. 01/16/21 04/16/21  Deatra Canter, NP   Viibryd 40 mg tab tablet Take 1 tablet by mouth once daily 12/04/20   Deatra Canter, NP   topiramate (Topamax) 50 mg tablet Take  by mouth nightly.    Other, Phys, MD   pravastatin (PRAVACHOL) 40 mg tablet Take 40 mg by mouth nightly.    Other, Phys, MD   canagliflozin-metFORMIN (Invokamet) 150-500 mg tab Take  by mouth two (2) times a day.    Other, Phys, MD   hydroCHLOROthiazide 25 mg tab 25 mg, lisinopriL 20 mg tab 20 mg Take  by mouth daily.    Other, Phys, MD   metoprolol tartrate (LOPRESSOR) 25 mg tablet Take  by mouth two (2) times a day.    Other, Phys, MD   estradioL (ESTRACE) 2 mg tablet Take  by mouth daily.    Other, Phys, MD   rizatriptan (MAXALT) 5 mg tablet Take 5 mg by mouth once as needed for Migraine. May repeat in 2 hours if needed    Other, Phys, MD   dulaglutide (Trulicity) 0.75 mg/0.5 mL sub-q pen 0.75 mg by SubCUTAneous route every seven (7) days. Takes on Mondays  Other, Phys, MD   aspirin delayed-release 81 mg tablet Take 81 mg by mouth daily.    Other, Phys, MD   naproxen sodium (Aleve) 220 mg tablet Take 220 mg by mouth two (2) times daily as needed for Pain.    Other, Phys, MD   Psychosocial History:  GAD, Panic disorder, MDD   Multiple MH hospitalizations  Dr. Deatra Canter (Psychiatrist)  [employer] in [place] [employer])  Lives with partner and 1 y/o daughter  Works part-time   Scientist, research (physical sciences)        ALLERGIES:  No Known Allergies     SOCIAL HISTORY:  Social History     Tobacco Use   ??? Smoking status: Never Smoker   ??? Smokeless tobacco: Never Used   Substance Use Topics   ??? Alcohol use: Not Currently       FAMILY HISTORY:  Family History   Problem Relation Age of Onset   ??? Diabetes Mother    ??? Cancer Mother    ??? Diabetes Sister    ??? Heart Disease Sister    ??? Cancer Sister    ??? Diabetes Brother    ??? Heart Disease  Brother         REVIEW OF SYSTEMS:  The patient reports complaints of depression and anxiety. Denies delusions,   A/V hallucinations, suicidal ideation, homicidal ideation.    Medical review of systems mainly considered negative.        MENTAL STATUS EXAM:  Sensorium  oriented to time, place and person   Orientation person, place, time/date, situation, day of week, month of year and year   Relations cooperative   Eye Contact appropriate   Appearance:  overweight and within normal Limits   Motor Behavior:  within normal limits   Speech:  hyperverbal and normal volume   Vocabulary average   Thought Process: goal directed and logical   Thought Content free of delusions and free of hallucinations   Suicidal ideations no plan  and no intention   Homicidal ideations no plan  and no intention   Mood:  anxious   Affect:  mood-congruent   Memory recent  adequate   Memory remote:  adequate   Concentration:  adequate   Abstraction:  abstract   Insight:  poor   Reliability poor   Judgment:  poor     ASSESSMENT:  The patient is a 55 y.o.  Caucasian female with a history of recurrent major depressive disorder, severe, suicide attempt via overdose, General anxiety d/o and panic d/o      PLAN:  Patient meets criteria for inpatient behavioral unit and patient is voluntary for treatment.    We will follow patient's course along with you as necessary. Thank you for the opportunity to participate in the care of your patient.                    SIGNED:    Toy Cookey, NP  02/20/2021

## 2021-02-21 ENCOUNTER — Inpatient Hospital Stay
Admit: 2021-02-21 | Discharge: 2021-02-24 | Disposition: A | Discharge status: Home / Self Care | Payer: MEDICARE | Source: Other Acute Inpatient Hospital | Attending: Psychiatry | Admitting: Psychiatry

## 2021-02-21 LAB — POCT GLUCOSE
POC Glucose: 125 mg/dL — ABNORMAL HIGH (ref 65–117)
POC Glucose: 122 mg/dL — ABNORMAL HIGH (ref 65–117)
POC Glucose: 150 mg/dL — ABNORMAL HIGH (ref 65–117)
POC Glucose: 175 mg/dL — ABNORMAL HIGH (ref 65–117)
POC Glucose: 120 mg/dL — ABNORMAL HIGH (ref 65–117)

## 2021-02-21 LAB — GLUCOSE, POC
Glucose (POC): 125 mg/dL — ABNORMAL HIGH (ref 65–117)
Glucose (POC): 122 mg/dL — ABNORMAL HIGH (ref 65–117)
Glucose (POC): 150 mg/dL — ABNORMAL HIGH (ref 65–117)
Glucose (POC): 175 mg/dL — ABNORMAL HIGH (ref 65–117)
Glucose (POC): 120 mg/dL — ABNORMAL HIGH (ref 65–117)

## 2021-02-21 MED ORDER — ARIPIPRAZOLE 5 MG TAB
5 mg | Freq: Every day | ORAL | Status: DC
Start: 2021-02-21 — End: 2021-02-24
  Administered 2021-02-21 – 2021-02-24 (×4): via ORAL

## 2021-02-21 MED ORDER — OLANZAPINE 5 MG TAB
5 mg | Freq: Four times a day (QID) | ORAL | Status: DC | PRN
Start: 2021-02-21 — End: 2021-02-24
  Administered 2021-02-24: 06:00:00 via ORAL

## 2021-02-21 MED ORDER — HYDROXYZINE 50 MG TAB
50 mg | Freq: Three times a day (TID) | ORAL | Status: DC | PRN
Start: 2021-02-21 — End: 2021-02-24
  Administered 2021-02-21 – 2021-02-24 (×2): via ORAL

## 2021-02-21 MED ORDER — ACETAMINOPHEN 325 MG TABLET
325 mg | ORAL | Status: DC | PRN
Start: 2021-02-21 — End: 2021-02-24

## 2021-02-21 MED ORDER — BUSPIRONE 10 MG TAB
10 mg | Freq: Two times a day (BID) | ORAL | Status: DC
Start: 2021-02-21 — End: 2021-02-24
  Administered 2021-02-21 – 2021-02-24 (×8): via ORAL

## 2021-02-21 MED ORDER — PRAVASTATIN 40 MG TAB
40 mg | Freq: Every evening | ORAL | Status: DC
Start: 2021-02-21 — End: 2021-02-24
  Administered 2021-02-22 – 2021-02-24 (×3): via ORAL

## 2021-02-21 MED ORDER — LORAZEPAM 2 MG/ML IJ SOLN
2 mg/mL | INTRAMUSCULAR | Status: DC | PRN
Start: 2021-02-21 — End: 2021-02-24

## 2021-02-21 MED ORDER — ESTRADIOL 1 MG TAB
1 mg | Freq: Every day | ORAL | Status: DC
Start: 2021-02-21 — End: 2021-02-24
  Administered 2021-02-21 – 2021-02-24 (×4): via ORAL

## 2021-02-21 MED ORDER — METFORMIN 500 MG TAB
500 mg | Freq: Two times a day (BID) | ORAL | Status: DC
Start: 2021-02-21 — End: 2021-02-24
  Administered 2021-02-21 – 2021-02-24 (×6): via ORAL

## 2021-02-21 MED ORDER — TOPIRAMATE 25 MG TAB
25 mg | Freq: Every evening | ORAL | Status: DC
Start: 2021-02-21 — End: 2021-02-24
  Administered 2021-02-22 – 2021-02-24 (×3): via ORAL

## 2021-02-21 MED ORDER — DEXTROSE 10% BOLUS IV (NEONATE)
10 % | INTRAVENOUS | Status: DC | PRN
Start: 2021-02-21 — End: 2021-02-24

## 2021-02-21 MED ORDER — BENZTROPINE 1 MG TAB
1 mg | Freq: Two times a day (BID) | ORAL | Status: DC | PRN
Start: 2021-02-21 — End: 2021-02-24

## 2021-02-21 MED ORDER — INSULIN LISPRO 100 UNIT/ML INJECTION
100 unit/mL | Freq: Four times a day (QID) | SUBCUTANEOUS | Status: DC
Start: 2021-02-21 — End: 2021-02-24

## 2021-02-21 MED ORDER — MAGNESIUM HYDROXIDE 400 MG/5 ML ORAL SUSP
400 mg/5 mL | Freq: Every day | ORAL | Status: DC | PRN
Start: 2021-02-21 — End: 2021-02-24

## 2021-02-21 MED ORDER — TRAZODONE 50 MG TAB
50 mg | Freq: Every evening | ORAL | Status: DC | PRN
Start: 2021-02-21 — End: 2021-02-24
  Administered 2021-02-22 – 2021-02-24 (×3): via ORAL

## 2021-02-21 MED ORDER — HALOPERIDOL LACTATE 5 MG/ML IJ SOLN
5 mg/mL | Freq: Four times a day (QID) | INTRAMUSCULAR | Status: DC | PRN
Start: 2021-02-21 — End: 2021-02-24

## 2021-02-21 MED ORDER — GLUCAGON 1 MG INJECTION
1 mg | INTRAMUSCULAR | Status: DC | PRN
Start: 2021-02-21 — End: 2021-02-24

## 2021-02-21 MED ORDER — GLUCOSE 4 GRAM CHEWABLE TAB
4 gram | ORAL | Status: DC | PRN
Start: 2021-02-21 — End: 2021-02-24

## 2021-02-21 MED ORDER — RIZATRIPTAN 5 MG TAB
5 mg | Freq: Once | ORAL | Status: AC | PRN
Start: 2021-02-21 — End: 2021-02-22

## 2021-02-21 MED ORDER — ASPIRIN 81 MG TAB, DELAYED RELEASE
81 mg | Freq: Every day | ORAL | Status: DC
Start: 2021-02-21 — End: 2021-02-24
  Administered 2021-02-21 – 2021-02-24 (×4): via ORAL

## 2021-02-21 MED ORDER — DIPHENHYDRAMINE HCL 50 MG/ML IJ SOLN
50 mg/mL | Freq: Two times a day (BID) | INTRAMUSCULAR | Status: DC | PRN
Start: 2021-02-21 — End: 2021-02-24

## 2021-02-21 MED FILL — BUSPIRONE 10 MG TAB: 10 mg | ORAL | Qty: 1

## 2021-02-21 MED FILL — ARIPIPRAZOLE 5 MG TAB: 5 mg | ORAL | Qty: 1

## 2021-02-21 MED FILL — ASPIRIN 81 MG TAB, DELAYED RELEASE: 81 mg | ORAL | Qty: 1

## 2021-02-21 MED FILL — ESTRADIOL 1 MG TAB: 1 mg | ORAL | Qty: 2

## 2021-02-21 MED FILL — METFORMIN 500 MG TAB: 500 mg | ORAL | Qty: 1

## 2021-02-21 MED FILL — HYDROXYZINE 50 MG TAB: 50 mg | ORAL | Qty: 1

## 2021-02-21 NOTE — Behavioral Health Treatment Team (Signed)
PRN Medication Documentation    Specific patient behavior that led to need for PRN medication: c/o anxiety  Staff interventions attempted prior to PRN being given: coping skills  PRN medication given: atarax  Patient response/effectiveness of PRN medication: tl aware

## 2021-02-21 NOTE — Progress Notes (Signed)
Comprehensive Nutrition Assessment      Type and Reason for Visit: Initial    Nutrition Recommendations/Plan:   1. Changed diet to 4 carb choice to help with BG control  2. Adjust bowel regimen  3. Document PO intake in flowsheet      Nutrition Assessment:       Pt admitted for Bipolar 1 disorder (HCC) [F31.9].   Past Medical History:   Diagnosis Date   . Anxiety 01/11/2021   . Arthritis    . Bipolar 1 disorder (HCC) 01/11/2021   . Diabetes (HCC)    . Diabetes mellitus type 2, controlled (HCC) 01/11/2021   . Headache    . Headache 01/11/2021   . Hypercholesterolemia    . Hyperlipidemia 01/11/2021   . Hypertension    . Hypertension 01/11/2021     Pt familiar to service from admission at [facility] prior to moving to [facility]. Previously noted weight of 177 lbs after weight of 190 lbs in a 3 day span. Unsure if this was a bed scale error- recommend obtaining a standing scale weight. Pt may have lost weight but unlikely 13 lbs over 2-3 days. Please document PO intake to determine if pt is meeting estimated needs. No reported n/v. Pt with BM 2/22 prior to [facility] admission. Pt had been tolerating PO and eating >75% at [facility]. Mallory Bonilla. Monitor BG.      Wt Readings from Last 10 Encounters:   02/21/21 80.3 kg (177 lb)   02/20/21 80.7 kg (177 lb 14.6 oz)   02/15/21 86.2 kg (190 lb)   02/09/21 86.5 kg (190 lb 11.2 oz)   01/11/21 85.6 kg (188 lb 12.8 oz)   07/03/20 90.7 kg (200 lb)   03/27/20 90.9 kg (200 lb 6.4 oz)       Malnutrition Assessment:  Malnutrition Status:  At risk for malnutrition (specify)    Context:  Acute illness     Findings of the 6 clinical characteristics of malnutrition:   Energy Intake:  Unable to assess  Weight Loss:  7 - Greater than 2% over 1 week     Body Fat Loss:  No significant body fat loss,     Muscle Mass Loss:  No significant muscle mass loss,    Fluid Accumulation:  No significant fluid accumulation,    Grip Strength:  Not performed         Estimated Daily Nutrient Needs:  Energy (kcal):  1820; Weight Used for Energy Requirements: Current  Protein (g): 80; Weight Used for Protein Requirements: Current  Fluid (ml/day): 1825; Method Used for Fluid Requirements: 1 ml/kcal    Documented meal intake:   No data found.    Documented Supplement intake:  No data found.    Nutrition Related Findings:    Bowel sounds:    Last BM:  ,  02/20/2021  Edema: Facial: No Edema (02/20/2021  7:50 AM)  Generalized: No Edema (02/20/2021  7:45 PM)  LLE: No Edema (02/20/2021  7:50 AM)  LUE: No Edema (02/20/2021  7:50 AM)  RLE: No Edema (02/20/2021  7:50 AM)  RUE: No Edema (02/20/2021  7:50 AM)      Medications:     Current Facility-Administered Medications   Medication Dose Route Frequency   . aspirin delayed-release tablet 81 mg  81 mg Oral DAILY   . topiramate (TOPAMAX) tablet 50 mg  50 mg Oral QHS   . pravastatin (PRAVACHOL) tablet 40 mg  40 mg Oral QHS   . estradioL (  ESTRACE) tablet 2 mg  2 mg Oral DAILY   . metFORMIN (GLUCOPHAGE) tablet 500 mg  500 mg Oral BID WITH MEALS   . insulin lispro (HUMALOG) injection   SubCUTAneous AC&HS   . busPIRone (BUSPAR) tablet 10 mg  10 mg Oral BID   . ARIPiprazole (ABILIFY) tablet 5 mg  5 mg Oral DAILY         Wounds:    None       Current Nutrition Therapies:  ADULT DIET Regular; 4 carb choices (60 gm/meal)    Anthropometric Measures:   Height:  5\' 5"  (165.1 cm)   Current Body Wt:  80.3 kg (177 lb 0.5 oz)    Admission Body Wt:   177 lbs (bedscale)     Usual Body Wt:   190-200 lbs      Ideal Body Wt:  125 lbs:  141.6 %    BMI Category:  Overweight (BMI 25.0-29.9)       Nutrition Diagnosis:    Inadequate oral intake related to psychological cause or life stress as evidenced by weight loss      Nutrition Interventions:   Food and/or Nutrient Delivery: Continue current diet  Nutrition Education and Counseling: No recommendations at this time  Coordination of Nutrition Care: Continue to monitor while inpatient,Interdisciplinary rounds    Goals:  PO >75% of meals with wt maintenance within  5-7 days       Nutrition Monitoring and Evaluation:   Behavioral-Environmental Outcomes: None identified  Food/Nutrient Intake Outcomes: Food and nutrient intake,Supplement intake  Physical Signs/Symptoms Outcomes: Biochemical data,Weight    Discharge Planning:    Continue current diet     Electronically signed by Jannet Mantis, RD   Contact: [phone]

## 2021-02-21 NOTE — Behavioral Health Treatment Team (Signed)
Pt requesting to leave the hospital. Order received for pt to be assessed for a TDO.    1:27 pm-Lane with [facility] is speaking to pt on the telephone.    1:47 pm-pt agreed to stay in the hospital. Notified Maurice March with [facility].

## 2021-02-21 NOTE — Progress Notes (Signed)
 10:54 am-Steffanie Epstein, NP notified of consult.        [employer]  Master Treatment Plan for Mallory Bonilla    Date Treatment Plan Initiated: 02/21/21    Treatment Plan Modalities:  Type of Modality Amount  (x minutes) Frequency (x/week) Duration (x days) Name of Responsible Staff   Community & wrap-up meetings to encourage peer interactions 15 7 1  Jaquelyn, BHT     Group psychotherapy to assist in building coping skills and internal controls 60 7 1 Amy Enroughy   Therapeutic activity groups to build coping skills 60 7 1 Amy Enroughy   Psychoeducation in group setting to address:   Medication education   15 7 1  Royce Mainland PharmD   Coping skills   30 3 1  Amy Enroughy   Relaxation techniques         Symptom management         Discharge planning   60 2 1 Amy Enroughy   Spirituality    60 2 1 Chaplain    NAMI   60 1 1 Volunteer of NAMI   Recovery/AA/NA         Physician medication management   15 7 1  Dr. Hildegard   Family meeting/discharge planning   15 2 1  Ellouise Curry and Amy Enroughy                                  Problem: Depressed Mood (Adult/Pediatric)  Goal: *STG: Participates in treatment plan  Outcome: Progressing Towards Goal  Note: Pt participated in treatment team. Out on the unit playing games with her peers. Ate breakfast during the shift.   Goal: *STG: Verbalizes anger, guilt, and other feelings in a constructive manor  Outcome: Progressing Towards Goal  Note: Able to verbalize feelings in a constructive manor. Stated she is feeling better today. Denies any thoughts of self harm currently. Stated she is less anxious.   Goal: *STG: Demonstrates reduction in symptoms and increase in insight into coping skills/future focused  Outcome: Progressing Towards Goal  Note: Encouraged pt to use positive coping skills.   Goal: Interventions  Outcome: Progressing Towards Goal     Problem: Patient Education: Go to Patient Education Activity  Goal: Patient/Family Education  Outcome:  Progressing Towards Goal     Problem: Falls - Risk of  Goal: *Absence of Falls  Description: Document [name] Fall Risk and appropriate interventions in the flowsheet.  Outcome: Progressing Towards Goal  Note: Fall Risk Interventions:     Medication Interventions: Teach patient to arise slowly     Problem: Patient Education: Go to Patient Education Activity  Goal: Patient/Family Education  Outcome: Progressing Towards Goal
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
## 2021-02-21 NOTE — Progress Notes (Signed)
Problem: Depressed Mood (Adult/Pediatric)  Goal: *STG: Participates in treatment plan  Outcome: Progressing Towards Goal  Goal: *STG: Verbalizes anger, guilt, and other feelings in a constructive manor  Outcome: Progressing Towards Goal  Goal: *STG: Demonstrates reduction in symptoms and increase in insight into coping skills/future focused  Outcome: Progressing Towards Goal  Goal: Interventions  Outcome: Progressing Towards Goal   Pt is visible on unit  No voiced complaints or acute distress at present  Sl anxiety noted

## 2021-02-21 NOTE — Behavioral Health Treatment Team (Signed)
Page triage hospitalist.    7:56 am-triage hospitalist notified of consult.

## 2021-02-21 NOTE — Behavioral Health Treatment Team (Signed)
Admission reviewed for medical necessity. Will follow with care mngt.

## 2021-02-21 NOTE — Consults (Signed)
Consults  by Veatrice Kells, MD at 02/21/21 1339                Author: Veatrice Kells, MD  Service: Internal Medicine  Author Type: Physician       Filed: 02/21/21 1637  Date of Service: 02/21/21 1339  Status: Signed          Editor: Veatrice Kells, MD (Physician)            Consult Orders        1. IP CONSULT TO INTERNAL MEDICINE [[id number]] ordered by Violeta Gelinas, NP at 02/21/21 [phone]                                      HISTORY AND PHYSICAL         PCP: Catarina Hartshorn, NP   History source: the patient, EMR         CC: suicide attempt         HPI: 55 y.o lady w/ DM, bipolar disorder, admitted to behavioral health after a suicide attempted from polysubstance overdose. She was discharged from [facility] inpatient medicine to inpatient psychiatry here. She was intubated there. She currently has no acute  complaints.         PMH/PSH:     Past Medical History:        Diagnosis  Date         ?  Anxiety  01/11/2021     ?  Arthritis       ?  Bipolar 1 disorder (HCC)  01/11/2021     ?  Diabetes (HCC)       ?  Diabetes mellitus type 2, controlled (HCC)  01/11/2021     ?  Headache       ?  Headache  01/11/2021     ?  Hypercholesterolemia       ?  Hyperlipidemia  01/11/2021     ?  Hypertension           ?  Hypertension  01/11/2021          Past Surgical History:         Procedure  Laterality  Date          ?  HX OOPHORECTOMY               Home meds:      Prior to Admission medications             Medication  Sig  Start Date  End Date  Taking?  Authorizing Provider            eszopiclone (Lunesta) 2 mg tablet  Take 2 mg by mouth nightly.      Yes  Provider, Historical     traZODone (DESYREL) 50 mg tablet  Take 50 mg by mouth nightly.      Yes  Provider, Historical     galcanezumab-gnlm (EMGALITY SYRINGE SC)  120 mEq/mL by SubCUTAneous route every month.      Yes  Provider, Historical     ARIPiprazole (ABILIFY) 5 mg tablet  Take 1 Tablet by mouth nightly for 90 days.  02/12/21  05/13/21    Allen-Blaine, Marylene Land, NP      clonazePAM (KlonoPIN) 0.5 mg tablet  TAKE 1 TABLET BY MOUTH TWICE DAILY AS NEEDED FOR ANXIETY . DO NOT EXCEED 2 PER 24 HOURS  02/09/21      Deatra Canter, NP     busPIRone (BUSPAR) 10 mg tablet  Take 1 Tablet by mouth daily for 90 days.  01/16/21  04/16/21    Deatra Canter, NP     Viibryd 40 mg tab tablet  Take 1 tablet by mouth once daily  12/04/20      Deatra Canter, NP     topiramate (Topamax) 50 mg tablet  Take  by mouth nightly.        Other, Phys, MD     pravastatin (PRAVACHOL) 40 mg tablet  Take 40 mg by mouth nightly.        Other, Phys, MD     canagliflozin-metFORMIN (Invokamet) 150-500 mg tab  Take  by mouth two (2) times a day.        Other, Phys, MD     estradioL (ESTRACE) 2 mg tablet  Take  by mouth daily.        Other, Phys, MD     rizatriptan (MAXALT) 5 mg tablet  Take 5 mg by mouth once as needed for Migraine. May repeat in 2 hours if needed        Other, Phys, MD     dulaglutide (Trulicity) 0.75 mg/0.5 mL sub-q pen  0.75 mg by SubCUTAneous route every seven (7) days. Takes on Mondays        Other, Phys, MD     aspirin delayed-release 81 mg tablet  Take 81 mg by mouth daily.        Other, Phys, MD            naproxen sodium (Aleve) 220 mg tablet  Take 220 mg by mouth two (2) times daily as needed for Pain.        Other, Phys, MD           Allergies:   No Known Allergies      FH:     Family History         Problem  Relation  Age of Onset          ?  Diabetes  Mother       ?  Cancer  Mother       ?  Diabetes  Sister       ?  Heart Disease  Sister       ?  Cancer  Sister       ?  Diabetes  Brother            ?  Heart Disease  Brother             SH:     Social History          Tobacco Use         ?  Smoking status:  Never Smoker     ?  Smokeless tobacco:  Never Used       Substance Use Topics         ?  Alcohol use:  Not Currently           ROS: A comprehensive review of systems was negative except for that written in the HPI.         PHYSICAL EXAM:   Visit Vitals      BP  137/82 (BP  Patient Position: Sitting)     Pulse  85     Temp  98.3 ??F (36.8 ??C)     Resp  16     Ht  5\' 5"  (1.651 m)     Wt  80.3 kg (177 lb)     SpO2  97%        BMI  29.45 kg/m??           Gen: NAD, non-toxic   HEENT: anicteric sclerae, normal conjunctiva   Neck: supple   Heart: RRR, no MRG, no JVD, no peripheral edema   Lungs: CTA b/l, non-labored respirations   Abd: soft, NT, ND, BS+   Extr: warm   Skin: dry   Neuro: CN II-XII grossly intact, normal speech, moves all extremities   Psych: normal mood, appropriate affect         Labs/Imaging:     Recent Results (from the past 24 hour(s))     GLUCOSE, POC          Collection Time: 02/20/21  4:45 PM         Result  Value  Ref Range            Glucose (POC)  127 (H)  65 - 117 mg/dL       Performed by  02/22/21         GLUCOSE, POC          Collection Time: 02/20/21  9:00 PM         Result  Value  Ref Range            Glucose (POC)  125 (H)  65 - 117 mg/dL       Performed by  Aysheshim Birtukan (PCT)         GLUCOSE, POC          Collection Time: 02/21/21 12:16 AM         Result  Value  Ref Range            Glucose (POC)  122 (H)  65 - 117 mg/dL       Performed by  02/23/21 JESSICA         GLUCOSE, POC          Collection Time: 02/21/21  8:18 AM         Result  Value  Ref Range            Glucose (POC)  150 (H)  65 - 117 mg/dL       Performed by  02/23/21         EKG, 12 LEAD, INITIAL          Collection Time: 02/21/21 10:46 AM         Result  Value  Ref Range            Ventricular Rate  83  BPM       Atrial Rate  83  BPM       P-R Interval  144  ms       QRS Duration  90  ms       Q-T Interval  390  ms       QTC Calculation (Bezet)  458  ms       Calculated P Axis  51  degrees       Calculated R Axis  51  degrees       Calculated T Axis  41  degrees       Diagnosis                 Normal sinus rhythm   Normal ECG   When compared with  ECG of 15-Feb-2021 18:34,   No significant change was found          GLUCOSE, POC          Collection Time: 02/21/21 12:04 PM          Result  Value  Ref Range            Glucose (POC)  175 (H)  65 - 117 mg/dL            Performed by  Ladon ApplebaumSmith Jacquelyn             No results for input(s): WBC, HGB, HCT, PLT, HGBEXT, HCTEXT, PLTEXT in the last 72 hours.   No results for input(s): NA, K, CL, CO2, BUN, CREA, GLU, CA, MG, PHOS, URICA in the last 72 hours.   No results for input(s): ALT, AP, TBIL, TBILI, TP, ALB, GLOB, GGT, AML, LPSE in the last 72 hours.      No lab exists for component: SGOT, GPT, AMYP, HLPSE      No results for input(s): CPK, CKNDX, TROIQ in the last 72 hours.      No lab exists for component: CPKMB      No results for input(s): INR, PTP, APTT, INREXT in the last 72 hours.       No results for input(s): PH, PCO2, PO2 in the last 72 hours.               Assessment & Plan:       Type 2 DM:    -SSI/POC checks      Hyperlipidemia:   -continue pravastatin      HTN: normotensive, BP meds stopped last admit      Bipolar disorder, SI: per behavioral health      Please call back if needed         Signed By:  Veatrice KellsAli O Al-Saadoon, MD           February 21, 2021

## 2021-02-22 LAB — LIPID PANEL
CHOL/HDL Ratio: 4.7 (ref 0.0–5.0)
Chol/HDL Ratio: 4.7 (ref 0.0–5.0)
Cholesterol, Total: 175 MG/DL (ref <200)
Cholesterol, total: 175 MG/DL (ref <200)
HDL Cholesterol: 37 MG/DL
HDL: 37 MG/DL
LDL Calculated: 102.2 MG/DL — ABNORMAL HIGH (ref 0–100)
LDL, calculated: 102.2 MG/DL — ABNORMAL HIGH (ref 0–100)
Triglyceride: 179 MG/DL — ABNORMAL HIGH (ref <150)
Triglycerides: 179 MG/DL — ABNORMAL HIGH (ref <150)
VLDL Cholesterol Calculated: 35.8 MG/DL
VLDL, calculated: 35.8 MG/DL

## 2021-02-22 LAB — POCT GLUCOSE
POC Glucose: 169 mg/dL — ABNORMAL HIGH (ref 65–117)
POC Glucose: 142 mg/dL — ABNORMAL HIGH (ref 65–117)
POC Glucose: 126 mg/dL — ABNORMAL HIGH (ref 65–117)
POC Glucose: 114 mg/dL (ref 65–117)

## 2021-02-22 LAB — TSH 3RD GENERATION
TSH: 1.49 u[IU]/mL (ref 0.36–3.74)
TSH: 1.49 u[IU]/mL (ref 0.36–3.74)

## 2021-02-22 LAB — GLUCOSE, POC
Glucose (POC): 169 mg/dL — ABNORMAL HIGH (ref 65–117)
Glucose (POC): 142 mg/dL — ABNORMAL HIGH (ref 65–117)
Glucose (POC): 126 mg/dL — ABNORMAL HIGH (ref 65–117)
Glucose (POC): 114 mg/dL (ref 65–117)

## 2021-02-22 MED FILL — METFORMIN 500 MG TAB: 500 mg | ORAL | Qty: 1

## 2021-02-22 MED FILL — TRAZODONE 50 MG TAB: 50 mg | ORAL | Qty: 1

## 2021-02-22 MED FILL — ARIPIPRAZOLE 5 MG TAB: 5 mg | ORAL | Qty: 1

## 2021-02-22 MED FILL — TOPIRAMATE 25 MG TAB: 25 mg | ORAL | Qty: 2

## 2021-02-22 MED FILL — BUSPIRONE 10 MG TAB: 10 mg | ORAL | Qty: 1

## 2021-02-22 MED FILL — PRAVASTATIN 40 MG TAB: 40 mg | ORAL | Qty: 1

## 2021-02-22 MED FILL — ESTRADIOL 1 MG TAB: 1 mg | ORAL | Qty: 2

## 2021-02-22 MED FILL — ASPIRIN 81 MG TAB, DELAYED RELEASE: 81 mg | ORAL | Qty: 1

## 2021-02-22 NOTE — Behavioral Health Treatment Team (Signed)
PSYCHIATRIC PROGRESS NOTE       Patient: Mallory Bonilla MRN: [id number]  SSN: [id number]    Date of Birth: 09/02/1966  Age: 55 y.o.  Sex: female      Admit Date: 02/20/2021    LOS: 2 days       Chief Complaint:  Maybe you'll decide to let me go tomorrow.    Interval History:  Mallory Bonilla spoke to Mallory Bonilla yesterday and she decided to stay voluntarily. She remains discharge focus but lesser today. She says she is doing ok, says being away from her partner has been beneficial. She has been social in the unit and interacts well with staff and peers. Says while she was at [facility], she had a lot of thinking and had some realization about everything that happened. Says having a sitter at [facility] helped her talk her feelings out. At the present time the patient Mallory Bonilla remains compliant with taking medications. Denies any adverse events from taking them and feels they have been beneficial.         Past Medical History:  Past Medical History:   Diagnosis Date   ??? Anxiety 01/11/2021   ??? Arthritis    ??? Bipolar 1 disorder ([place]) 01/11/2021   ??? Diabetes ([place])    ??? Diabetes mellitus type 2, controlled ([place]) 01/11/2021   ??? Headache    ??? Headache 01/11/2021   ??? Hypercholesterolemia    ??? Hyperlipidemia 01/11/2021   ??? Hypertension    ??? Hypertension 01/11/2021         ALLERGIES:(reviewed/updated 02/22/2021)  No Known Allergies    Laboratory report:  Lab Results   Component Value Date/Time    WBC 7.5 02/17/2021 03:05 AM    HGB 11.8 02/17/2021 03:05 AM    HCT 38.2 02/17/2021 03:05 AM    PLATELET 155 02/17/2021 03:05 AM    MCV 84.7 02/17/2021 03:05 AM      Lab Results   Component Value Date/Time    Sodium 141 02/17/2021 03:05 AM    Potassium 3.6 02/17/2021 03:05 AM    Chloride 110 (H) 02/17/2021 03:05 AM    CO2 22 02/17/2021 03:05 AM    Anion gap 9 02/17/2021 03:05 AM    Glucose 96 02/17/2021 03:05 AM    BUN 12 02/17/2021 03:05 AM    Creatinine 0.81 02/17/2021 03:05 AM    BUN/Creatinine ratio 15 02/17/2021 03:05 AM    GFR est AA >60  02/17/2021 03:05 AM    GFR est non-AA >60 02/17/2021 03:05 AM    Calcium 8.0 (L) 02/17/2021 03:05 AM    Bilirubin, total 0.4 02/16/2021 02:45 AM    Alk. phosphatase 80 02/16/2021 02:45 AM    Protein, total 5.6 (L) 02/16/2021 02:45 AM    Albumin 2.9 (L) 02/16/2021 02:45 AM    Globulin 2.7 02/16/2021 02:45 AM    A-G Ratio 1.1 02/16/2021 02:45 AM    ALT (SGPT) 37 02/16/2021 02:45 AM      Vitals:    02/21/21 1439 02/21/21 1600 02/21/21 2244 02/22/21 0815   BP:  139/78 125/68 126/71   Pulse:  92 92 78   Resp:  _0 Temp:  98.3 ??F (36.8 ??C) 98.6 ??F (37 ??C) 97.4 ??F (36.3 ??C)   SpO2:  98% 95% 98%   Weight: 86.5 kg (190 lb 11.2 oz)      Height: 5' 5" (1.651 m)          No results found for: VALF2,  VALAC, VALP, VALPR, DS6, CRBAM, CRBAMP, CARB2, XCRBAM  No results found for: LITHM    Vital Signs  Patient Vitals for the past 24 hrs:   Temp Pulse Resp BP SpO2   02/22/21 0815 97.4 ??F (36.3 ??C) 78 16 126/71 98 %   02/21/21 2244 98.6 ??F (37 ??C) 92 16 125/68 95 %   02/21/21 1600 98.3 ??F (36.8 ??C) 92 16 139/78 98 %   02/21/21 1138 98.3 ??F (36.8 ??C) 85 16 137/82 97 %     Wt Readings from Last 3 Encounters:   02/21/21 86.5 kg (190 lb 11.2 oz)   02/20/21 80.7 kg (177 lb 14.6 oz)   02/15/21 86.2 kg (190 lb)     Temp Readings from Last 3 Encounters:   02/22/21 97.4 ??F (36.3 ??C)   02/20/21 98.1 ??F (36.7 ??C)   02/15/21 (!) 94.9 ??F (34.9 ??C)     BP Readings from Last 3 Encounters:   02/22/21 126/71   02/20/21 136/72   02/15/21 121/61     Pulse Readings from Last 3 Encounters:   02/22/21 78   02/20/21 88   02/15/21 (!) 106       Radiology (reviewed/updated 02/22/2021)  XR CHEST PORT    Result Date: 02/15/2021  EXAM: XR CHEST PORT INDICATION: ETT position COMPARISON: Earlier in the day FINDINGS: A portable AP radiograph of the chest was obtained at 1959 hours. The patient is on a cardiac monitor. The central line tip is at the cavoatrial junction.  The endotracheal tube tip is at the thoracic inlet. The nasogastric tube continues beyond  the film. The lungs are clear. The cardiac and mediastinal contours and pulmonary vascularity are normal.  The bones and soft tissues are grossly within normal limits.     Appropriate tube and line placement.    XR CHEST PORT    Result Date: 02/15/2021  Chest single view. Right neck central venous catheter with its tip overlying RA region. Bibasilar subsegmental atelectasis. Findings likely in part exaggerated by overlying soft tissue. No gross interstitial or alveolar pulmonary edema. Cardiac and mediastinal structures magnified on this AP view. No pneumothorax or sizable pleural effusion.    XR ABD PORT  1 V    Result Date: 02/15/2021  HISTORY:  OGT placement An AP radiograph of the abdomen demonstrates the OGT side port in the region of the stomach.  The osseous structures appear unremarkable.     Appropriate OGT placement.       Current Facility-Administered Medications   Medication Dose Route Frequency Provider Last Rate Last Admin   ??? OLANZapine (ZyPREXA) tablet 5 mg  5 mg Oral Q6H PRN Magnus Ivan, NP       ??? haloperidol lactate (HALDOL) injection 5 mg  5 mg IntraMUSCular Q6H PRN Magnus Ivan, NP       ??? benztropine (COGENTIN) tablet 1 mg  1 mg Oral BID PRN Magnus Ivan, NP       ??? diphenhydrAMINE (BENADRYL) injection 50 mg  50 mg IntraMUSCular BID PRN Magnus Ivan, NP       ??? hydrOXYzine HCL (ATARAX) tablet 50 mg  50 mg Oral TID PRN Magnus Ivan, NP   50 mg at 02/21/21 1839   ??? LORazepam (ATIVAN) injection 1 mg  1 mg IntraMUSCular Q4H PRN Magnus Ivan, NP       ??? traZODone (DESYREL) tablet 50 mg  50 mg Oral QHS PRN Magnus Ivan, NP   50  mg at 02/21/21 2113   ??? acetaminophen (TYLENOL) tablet 650 mg  650 mg Oral Q4H PRN Magnus Ivan, NP       ??? magnesium hydroxide (MILK OF MAGNESIA) 400 mg/5 mL oral suspension 30 mL  30 mL Oral DAILY PRN Magnus Ivan, NP       ??? aspirin delayed-release tablet 81 mg  81 mg Oral DAILY Seville Brick A, NP   81 mg at 02/22/21 0823   ???  topiramate (TOPAMAX) tablet 50 mg  50 mg Oral QHS Ebrahim Deremer A, NP   50 mg at 02/21/21 2113   ??? pravastatin (PRAVACHOL) tablet 40 mg  40 mg Oral QHS Neely Cecena A, NP   40 mg at 02/21/21 2113   ??? estradioL (ESTRACE) tablet 2 mg  2 mg Oral DAILY Kasheena Sambrano A, NP   2 mg at 02/22/21 2637   ??? metFORMIN (GLUCOPHAGE) tablet 500 mg  500 mg Oral BID WITH MEALS Lashaun Krapf A, NP   500 mg at 02/22/21 8588   ??? insulin lispro (HUMALOG) injection   SubCUTAneous AC&HS Roselee Tayloe A, NP       ??? glucose chewable tablet 16 g  4 Tablet Oral PRN Catarino Vold A, NP       ??? glucagon (GLUCAGEN) injection 1 mg  1 mg IntraMUSCular PRN Urijah Raynor A, NP       ??? dextrose 10 % infusion 0-250 mL  0-250 mL IntraVENous PRN Quantel Mcinturff A, NP       ??? busPIRone (BUSPAR) tablet 10 mg  10 mg Oral BID Jenkins Risdon A, NP   10 mg at 02/22/21 5027   ??? ARIPiprazole (ABILIFY) tablet 5 mg  5 mg Oral DAILY Natayah Warmack A, NP   5 mg at 02/22/21 7412       Side Effects: (reviewed/updated 02/22/2021)  None reported or admitted to.    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 BPD by history.    Continue current medications as prescribed.  We will closely monitor for safety.  We will encourage reality orientation.  Disposition planning to continue.       I certify that this patients inpatient  psychiatric hospital services furnished since the previous certification were, and continue to be, required for treatment that could reasonably be expected to improve the patient's condition, or for diagnostic study, and that the patient continues to need, on a daily basis, active treatment furnished directly by or requiring the supervision of inpatient psychiatric facility personnel. In addition, the hospital records show that services furnished were intensive treatment services, admission or related services, or equivalent services.      Signed:  Su Hoff, NP  02/22/2021

## 2021-02-22 NOTE — Progress Notes (Signed)
Problem: Depressed Mood (Adult/Pediatric)  Goal: *STG: Participates in treatment plan  Outcome: Progressing Towards Goal  Note: Out on unit engaged with a brighter affect, mood stable hopeful. Actively working on her safety plan such as decrease work hours, mediations locked up and her partner will actively be a support for patient. Tx team will review with partner today tx plan and safety concerns..  Goal: *STG: Verbalizes anger, guilt, and other feelings in a constructive manor  Outcome: Progressing Towards Goal  Goal: *STG: Demonstrates reduction in symptoms and increase in insight into coping skills/future focused  Outcome: Progressing Towards Goal  Goal: Interventions  Outcome: Progressing Towards Goal

## 2021-02-22 NOTE — Progress Notes (Signed)
Problem: Discharge Planning  Goal: *Discharge to safe environment  Outcome: Progressing Towards Goal  Note: Pt will return home when stable  Goal: *Knowledge of medication management  Outcome: Progressing Towards Goal  Note: Pt verbalizes her understanding of the prescribed medications  Goal: *Knowledge of discharge instructions  Outcome: Progressing Towards Goal  Note: Pt verbalizes her understanding of the treatment goals and discharge plan

## 2021-02-22 NOTE — Progress Notes (Signed)
Patient received resting in bed with eyes closed. NAD and no complaints noted. Safety measures in place. Will continue to monitor with q15min rounds.   Problem: Falls - Risk of  Goal: *Absence of Falls  Description: Document Schmid Fall Risk and appropriate interventions in the flowsheet.  Outcome: Progressing Towards Goal  Note: Fall Risk Interventions:  Mobility Interventions: Assess mobility with egress test         Medication Interventions: Teach patient to arise slowly    Elimination Interventions: Toilet paper/wipes in reach    History of Falls Interventions: Door open when patient unattended

## 2021-02-22 NOTE — Progress Notes (Signed)
Problem: Depressed Mood (Adult/Pediatric)  Goal: *STG: Verbalizes anger, guilt, and other feelings in a constructive manor  Outcome: Progressing Towards Goal     Problem: Discharge Planning  Goal: *Knowledge of discharge instructions  Outcome: Progressing Towards Goal  Patient ate well at dinner, no reports of pain or distress

## 2021-02-23 LAB — POCT GLUCOSE
POC Glucose: 146 mg/dL — ABNORMAL HIGH (ref 65–117)
POC Glucose: 144 mg/dL — ABNORMAL HIGH (ref 65–117)
POC Glucose: 115 mg/dL (ref 65–117)
POC Glucose: 153 mg/dL — ABNORMAL HIGH (ref 65–117)

## 2021-02-23 LAB — EKG 12-LEAD
Atrial Rate: 83 {beats}/min
Diagnosis: NORMAL
P Axis: 51 degrees
P-R Interval: 144 ms
Q-T Interval: 390 ms
QRS Duration: 90 ms
QTc Calculation (Bazett): 458 ms
R Axis: 51 degrees
T Axis: 41 degrees
Ventricular Rate: 83 {beats}/min

## 2021-02-23 LAB — GLUCOSE, POC
Glucose (POC): 146 mg/dL — ABNORMAL HIGH (ref 65–117)
Glucose (POC): 144 mg/dL — ABNORMAL HIGH (ref 65–117)
Glucose (POC): 115 mg/dL (ref 65–117)
Glucose (POC): 153 mg/dL — ABNORMAL HIGH (ref 65–117)

## 2021-02-23 LAB — EKG, 12 LEAD, INITIAL
Atrial Rate: 83 {beats}/min
Calculated P Axis: 51 degrees
Calculated R Axis: 51 degrees
Calculated T Axis: 41 degrees
Diagnosis: NORMAL
P-R Interval: 144 ms
Q-T Interval: 390 ms
QRS Duration: 90 ms
QTC Calculation (Bezet): 458 ms
Ventricular Rate: 83 {beats}/min

## 2021-02-23 MED ORDER — TRAZODONE 50 MG TAB
50 mg | ORAL_TABLET | Freq: Every evening | ORAL | 0 refills | Status: DC
Start: 2021-02-23 — End: 2021-03-08

## 2021-02-23 MED ORDER — HYDROCHLOROTHIAZIDE 25 MG TAB
25 mg | ORAL_TABLET | Freq: Every day | ORAL | 0 refills | Status: AC
Start: 2021-02-23 — End: ?

## 2021-02-23 MED ORDER — BUSPIRONE 10 MG TAB
10 mg | ORAL_TABLET | Freq: Two times a day (BID) | ORAL | 0 refills | Status: DC
Start: 2021-02-23 — End: 2021-03-08

## 2021-02-23 MED ORDER — ARIPIPRAZOLE 5 MG TAB
5 mg | ORAL_TABLET | Freq: Every evening | ORAL | 0 refills | Status: AC
Start: 2021-02-23 — End: 2021-05-24

## 2021-02-23 MED ORDER — HYDROCHLOROTHIAZIDE 25 MG TAB
25 mg | Freq: Every day | ORAL | Status: DC
Start: 2021-02-23 — End: 2021-02-24
  Administered 2021-02-23 – 2021-02-24 (×2): via ORAL

## 2021-02-23 MED ORDER — TOPIRAMATE 50 MG TAB
50 mg | ORAL_TABLET | Freq: Every evening | ORAL | 0 refills | Status: AC
Start: 2021-02-23 — End: ?

## 2021-02-23 MED FILL — ESTRADIOL 1 MG TAB: 1 mg | ORAL | Qty: 2

## 2021-02-23 MED FILL — METFORMIN 500 MG TAB: 500 mg | ORAL | Qty: 1

## 2021-02-23 MED FILL — BUSPIRONE 10 MG TAB: 10 mg | ORAL | Qty: 1

## 2021-02-23 MED FILL — ARIPIPRAZOLE 5 MG TAB: 5 mg | ORAL | Qty: 1

## 2021-02-23 MED FILL — ASPIRIN 81 MG TAB, DELAYED RELEASE: 81 mg | ORAL | Qty: 1

## 2021-02-23 MED FILL — PRAVASTATIN 40 MG TAB: 40 mg | ORAL | Qty: 1

## 2021-02-23 MED FILL — TOPIRAMATE 25 MG TAB: 25 mg | ORAL | Qty: 2

## 2021-02-23 MED FILL — TRAZODONE 50 MG TAB: 50 mg | ORAL | Qty: 1

## 2021-02-23 MED FILL — HYDROCHLOROTHIAZIDE 25 MG TAB: 25 mg | ORAL | Qty: 1

## 2021-02-23 NOTE — Progress Notes (Signed)
Problem: Depressed Mood (Adult/Pediatric)  Goal: *STG: Participates in treatment plan  Outcome: Progressing Towards Goal  Goal: *STG: Verbalizes anger, guilt, and other feelings in a constructive manor  Outcome: Progressing Towards Goal  Goal: *STG: Demonstrates reduction in symptoms and increase in insight into coping skills/future focused  Outcome: Progressing Towards Goal

## 2021-02-23 NOTE — Progress Notes (Signed)
Pharmacist Discharge Medication Reconciliation (anticipated date of discharge - 02/24/21)    Discharging Provider: Jason Coop, NP    Significant PMH:   Past Medical History:   Diagnosis Date    Anxiety 01/11/2021    Arthritis     Bipolar 1 disorder (HCC) 01/11/2021    Diabetes (HCC)     Diabetes mellitus type 2, controlled (HCC) 01/11/2021    Headache     Headache 01/11/2021    Hypercholesterolemia     Hyperlipidemia 01/11/2021    Hypertension     Hypertension 01/11/2021     Chief Complaint for this Admission: No chief complaint on file.    Allergies: Patient has no known allergies.    Discharge Medications:   Current Discharge Medication List        START taking these medications    Details   hydroCHLOROthiazide (HYDRODIURIL) 25 mg tablet Take 1 Tablet by mouth daily. Indications: high blood pressure  Qty: 30 Tablet, Refills: 0  Start date: 02/23/2021           CONTINUE these medications which have CHANGED    Details   ARIPiprazole (ABILIFY) 5 mg tablet Take 1 Tablet by mouth nightly for 90 days. Indications: additional treatment for major depressive disorder  Qty: 30 Tablet, Refills: 0  Start date: 02/23/2021, End date: 05/24/2021    Associated Diagnoses: Depression, unspecified depression type      busPIRone (BUSPAR) 10 mg tablet Take 1 Tablet by mouth two (2) times a day. Indications: repeated episodes of anxiety  Qty: 60 Tablet, Refills: 0  Start date: 02/23/2021      topiramate (Topamax) 50 mg tablet Take 1 Tablet by mouth nightly. Indications: migraine prevention  Qty: 30 Tablet, Refills: 0  Start date: 02/23/2021      traZODone (DESYREL) 50 mg tablet Take 1 Tablet by mouth nightly. Indications: insomnia associated with depression  Qty: 30 Tablet, Refills: 0  Start date: 02/23/2021           CONTINUE these medications which have NOT CHANGED    Details   pravastatin (PRAVACHOL) 40 mg tablet Take 40 mg by mouth nightly.      canagliflozin-metFORMIN (Invokamet) 150-500 mg tab Take  by mouth two (2) times a day.       estradioL (ESTRACE) 2 mg tablet Take  by mouth daily.      aspirin delayed-release 81 mg tablet Take 81 mg by mouth daily.           STOP taking these medications       eszopiclone (Lunesta) 2 mg tablet Comments:   Reason for Stopping:         galcanezumab-gnlm (EMGALITY SYRINGE SC) Comments:   Reason for Stopping:         metoprolol tartrate (LOPRESSOR) 25 mg tablet Comments:   Reason for Stopping:         clonazePAM (KlonoPIN) 0.5 mg tablet Comments:   Reason for Stopping:         Viibryd 40 mg tab tablet Comments:   Reason for Stopping:         rizatriptan (MAXALT) 5 mg tablet Comments:   Reason for Stopping:         dulaglutide (Trulicity) 0.75 mg/0.5 mL sub-q pen Comments:   Reason for Stopping:         naproxen sodium (Aleve) 220 mg tablet Comments:   Reason for Stopping:         hydroCHLOROthiazide 25 mg tab 25 mg, lisinopriL 20  mg tab 20 mg Comments:   Reason for Stopping:         metoprolol tartrate (LOPRESSOR) 25 mg tablet Comments:   Reason for Stopping:             The patient's chart, MAR and AVS were reviewed by Ramonita Lab, PHARMD.

## 2021-02-23 NOTE — Behavioral Health Treatment Team (Signed)
Behavioral Health Transition Record to Provider    Patient Name: Mallory Bonilla  Date of Birth: [birth year]  Medical Record Number: [id number]  Date of Admission: 02/20/2021  Date of Discharge: 02/24/2021    Attending Provider: Shela Leff, MD  Discharging Provider: Jason Coop, NP  To contact this individual call [phone] and ask the operator to page.  If unavailable, ask to be transferred to [facility] Provider on call.  A Behavioral Health Provider will be available on call 24/7 and during holidays.    Primary Care Provider: Catarina Hartshorn, NP    No Known Allergies    Reason for Admission: The patient is a 55 year old female who is currently admitted at [facility] inpatient psychiatric unit on a voluntary basis.  She is a transfer from [facility]. [facility].  She was on the medial unit for several days and was cleared for transfer here at [facility]     Admission Diagnosis: Bipolar 1 disorder (HCC) [F31.9]    * No surgery found *    Results for orders placed or performed during the hospital encounter of 02/20/21   TSH 3RD GENERATION   Result Value Ref Range    TSH 1.49 0.36 - 3.74 uIU/mL   LIPID PANEL   Result Value Ref Range    Cholesterol, total 175 <200 MG/DL    Triglyceride 865 (H) <150 MG/DL    HDL Cholesterol 37 MG/DL    LDL, calculated 784.6 (H) 0 - 100 MG/DL    VLDL, calculated 96.2 MG/DL    CHOL/HDL Ratio 4.7 0.0 - 5.0     GLUCOSE, POC   Result Value Ref Range    Glucose (POC) 122 (H) 65 - 117 mg/dL    Performed by SCOTT JESSICA    GLUCOSE, POC   Result Value Ref Range    Glucose (POC) 150 (H) 65 - 117 mg/dL    Performed by Ladon Applebaum    GLUCOSE, POC   Result Value Ref Range    Glucose (POC) 175 (H) 65 - 117 mg/dL    Performed by Ladon Applebaum    GLUCOSE, POC   Result Value Ref Range    Glucose (POC) 120 (H) 65 - 117 mg/dL    Performed by Trinna Balloon    GLUCOSE, POC   Result Value Ref Range    Glucose (POC) 169 (H) 65 - 117 mg/dL    Performed by Trinna Balloon    GLUCOSE,  POC   Result Value Ref Range    Glucose (POC) 142 (H) 65 - 117 mg/dL    Performed by Melburn Hake    GLUCOSE, POC   Result Value Ref Range    Glucose (POC) 126 (H) 65 - 117 mg/dL    Performed by Melburn Hake    GLUCOSE, POC   Result Value Ref Range    Glucose (POC) 114 65 - 117 mg/dL    Performed by Trinna Balloon    GLUCOSE, POC   Result Value Ref Range    Glucose (POC) 146 (H) 65 - 117 mg/dL    Performed by Trinna Balloon    GLUCOSE, POC   Result Value Ref Range    Glucose (POC) 144 (H) 65 - 117 mg/dL    Performed by Melburn Hake    GLUCOSE, POC   Result Value Ref Range    Glucose (POC) 115 65 - 117 mg/dL    Performed by Melburn Hake    EKG, 12 LEAD, INITIAL  Result Value Ref Range    Ventricular Rate 83 BPM    Atrial Rate 83 BPM    P-R Interval 144 ms    QRS Duration 90 ms    Q-T Interval 390 ms    QTC Calculation (Bezet) 458 ms    Calculated P Axis 51 degrees    Calculated R Axis 51 degrees    Calculated T Axis 41 degrees    Diagnosis       Normal sinus rhythm  Normal ECG  When compared with ECG of 15-Feb-2021 18:34,  No significant change was found  Confirmed by Mathews Robinsons MD. ([id number]) on 02/23/2021 10:12:35 AM         Immunizations administered during this encounter:   There is no immunization history on file for this patient.    Screening for Metabolic Disorders for Patients on Antipsychotic Medications  (Data obtained from the EMR)    Estimated Body Mass Index  Estimated body mass index is 31.73 kg/m as calculated from the following:    Height as of this encounter: 5\' 5"  (1.651 m).    Weight as of this encounter: 86.5 kg (190 lb 11.2 oz).     Vital Signs/Blood Pressure  Visit Vitals  BP 134/62   Pulse 85   Temp 97.8 F (36.6 C)   Resp 16   Ht 5\' 5"  (1.651 m)   Wt 86.5 kg (190 lb 11.2 oz)   SpO2 98%   BMI 31.73 kg/m       Blood Glucose/Hemoglobin A1c  Lab Results   Component Value Date/Time    Glucose 96 02/17/2021 03:05 AM    Glucose (POC) 115 02/23/2021 11:24 AM       Lab Results    Component Value Date/Time    Hemoglobin A1c 6.2 (H) 02/16/2021 02:45 AM        Lipid Panel  Lab Results   Component Value Date/Time    Cholesterol, total 175 02/22/2021 08:13 AM    HDL Cholesterol 37 02/22/2021 08:13 AM    LDL, calculated 102.2 (H) 10/26/2535 08:13 AM    Triglyceride 179 (H) 02/22/2021 08:13 AM    CHOL/HDL Ratio 4.7 02/22/2021 08:13 AM        Discharge Diagnosis: Major depressive disorder, recurrent, severe without psychotic features; PTSD; borderline personality disorder by history.    Discharge Plan: The patient Mallory Bonilla exhibits the ability to control behavior in a less restrictive environment.  Patient's level of functioning is improving.  No assaultive/destructive behavior has been observed for the past 24 hours.  No suicidal/homicidal threat or behavior has been observed for the past 24 hours.  There is no evidence of serious medication side effects.  Patient has not been in physical or protective restraints for at least the past 24 hours.    If weapons involved, how are they secured? NA    Is patient aware of and in agreement with discharge plan? yes    Arrangements for medication:  Prescriptions given to patient, given a weeks supply or 30 day supply.     Copy of discharge instructions to provider?:  yes    Arrangements for transportation home:  Partner will pick-up Saturday morning    Keep all follow up appointments as scheduled, continue to take prescribed medications per physician instructions.  Mental health crisis number:  911 or [address] [facility]:  [phone]      Mental Health Emergency WARM LINE      1-866-400-MHAV [phone])      M-F:  9am to 9pm      Sat & Sun: 5pm - 9pm  National suicide prevention lines:                             1-800-SUICIDE 425 307 2254)       1-800-273-TALK [phone])   24/7 Crisis Text Line:  Text HOME to [phone])[phone]    Discharge Medication List and Instructions:   Current Discharge Medication List      START taking these medications    Details    hydroCHLOROthiazide (HYDRODIURIL) 25 mg tablet Take 1 Tablet by mouth daily. Indications: high blood pressure  Qty: 30 Tablet, Refills: 0  Start date: 02/23/2021         CONTINUE these medications which have CHANGED    Details   ARIPiprazole (ABILIFY) 5 mg tablet Take 1 Tablet by mouth nightly for 90 days. Indications: additional treatment for major depressive disorder  Qty: 30 Tablet, Refills: 0  Start date: 02/23/2021, End date: 05/24/2021    Associated Diagnoses: Depression, unspecified depression type      busPIRone (BUSPAR) 10 mg tablet Take 1 Tablet by mouth two (2) times a day. Indications: repeated episodes of anxiety  Qty: 60 Tablet, Refills: 0  Start date: 02/23/2021      topiramate (Topamax) 50 mg tablet Take 1 Tablet by mouth nightly. Indications: migraine prevention  Qty: 30 Tablet, Refills: 0  Start date: 02/23/2021      traZODone (DESYREL) 50 mg tablet Take 1 Tablet by mouth nightly. Indications: insomnia associated with depression  Qty: 30 Tablet, Refills: 0  Start date: 02/23/2021         CONTINUE these medications which have NOT CHANGED    Details   pravastatin (PRAVACHOL) 40 mg tablet Take 40 mg by mouth nightly.      canagliflozin-metFORMIN (Invokamet) 150-500 mg tab Take  by mouth two (2) times a day.      estradioL (ESTRACE) 2 mg tablet Take  by mouth daily.      aspirin delayed-release 81 mg tablet Take 81 mg by mouth daily.         STOP taking these medications       eszopiclone (Lunesta) 2 mg tablet Comments:   Reason for Stopping:         galcanezumab-gnlm (EMGALITY SYRINGE SC) Comments:   Reason for Stopping:         metoprolol tartrate (LOPRESSOR) 25 mg tablet Comments:   Reason for Stopping:         clonazePAM (KlonoPIN) 0.5 mg tablet Comments:   Reason for Stopping:         Viibryd 40 mg tab tablet Comments:   Reason for Stopping:         rizatriptan (MAXALT) 5 mg tablet Comments:   Reason for Stopping:         dulaglutide (Trulicity) 0.75 mg/0.5 mL sub-q pen Comments:   Reason for  Stopping:         naproxen sodium (Aleve) 220 mg tablet Comments:   Reason for Stopping:         hydroCHLOROthiazide 25 mg tab 25 mg, lisinopriL 20 mg tab 20 mg Comments:   Reason for Stopping:         metoprolol tartrate (LOPRESSOR) 25 mg tablet Comments:   Reason for Stopping:               Unresulted Labs (24h ago, onward)  None        To obtain results of studies pending at discharge, please contact [phone]    Follow-up Information     Follow up With Specialties Details Why Contact Info    Deatra Canter  On 03/08/2021 10:15am IN-PERSON appt for medication management [address]   [place], [place] [address]  Phone: [phone]  FAX: [phone]    Landry Dyke  On 02/27/2021 2:00 appt for couples therapy [address] [place], [place] [address]  Phone: [phone]    Catarina Hartshorn, NP Nurse Practitioner Schedule an appointment as soon as possible for a visit PCP; General medical services [address]  [place] [address]  [phone]            Advanced Directive:   Does the patient have an appointed surrogate decision maker? No  Does the patient have a Medical Advance Directive? No  Does the patient have a Psychiatric Advance Directive? No  If the patient does not have a surrogate or Medical Advance Directive AND Psychiatric Advance Directive, the patient was offered information on these advance directives Patient declined to complete    Patient Instructions: Please continue all medications until otherwise directed by physician.      Tobacco Cessation Discharge Plan:   Is the patient a smoker and needs referral for smoking cessation? No  Patient referred to the following for smoking cessation with an appointment? No     Patient was offered medication to assist with smoking cessation at discharge? No  Was education for smoking cessation added to the discharge instructions? Yes    Alcohol/Substance Abuse Discharge Plan:   Does the patient have a history of substance/alcohol abuse and  requires a referral for treatment? No  Patient referred to the following for substance/alcohol abuse treatment with an appointment? No  Patient was offered medication to assist with alcohol cessation at discharge? No  Was education for substance/alcohol abuse added to discharge instructions? No    Patient discharged to Home; discussed with patient/caregiver and provided to the patient/caregiver either in hard copy or electronically.

## 2021-02-23 NOTE — Behavioral Health Treatment Team (Signed)
PRN Medication Documentation    Specific patient behavior that led to need for PRN medication: given to promote sleep  Staff interventions attempted prior to PRN being given: medication education  PRN medication given: Trazodone 50mg  PO  Patient response/effectiveness of PRN medication: will continue to monitor
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
## 2021-02-23 NOTE — Behavioral Health Treatment Team (Signed)
Visible on the unit. Calm and cooperative at this time.  Pt denies SI. Compliant with meals and medications.

## 2021-02-23 NOTE — Progress Notes (Signed)
Patient received resting in bed with eyes closed. NAD and no complaints noted. Safety measures in place. Will continue to monitor with q15min rounds.   Problem: Falls - Risk of  Goal: *Absence of Falls  Description: Document Schmid Fall Risk and appropriate interventions in the flowsheet.  Outcome: Progressing Towards Goal  Note: Fall Risk Interventions:  Mobility Interventions: Assess mobility with egress test         Medication Interventions: Teach patient to arise slowly    Elimination Interventions: Toilet paper/wipes in reach    History of Falls Interventions: Door open when patient unattended

## 2021-02-23 NOTE — Progress Notes (Signed)
Problem: Depressed Mood (Adult/Pediatric)  Goal: *STG: Participates in treatment plan  Outcome: Progressing Towards Goal  Note: Out on unit engaged brighter affect when talking with peers. Spoke with partner, discussed medication safety and d/c plans. She verbalized understanding that medications prescribed and over the counter will be locked with 3 doses only will be available for the patient to access. States she feels that patient is safe to be discharged will be able to complete securing medications by Friday evening after work. States she can pick up patient Saturday morning. Tx team make aware.   Goal: *STG: Verbalizes anger, guilt, and other feelings in a constructive manor  Outcome: Progressing Towards Goal  Goal: *STG: Demonstrates reduction in symptoms and increase in insight into coping skills/future focused  Outcome: Progressing Towards Goal  Goal: Interventions  Outcome: Progressing Towards Goal

## 2021-02-23 NOTE — Progress Notes (Signed)
Laboratory Monitoring for Antipsychotics:    This patient is currently prescribed the following medication(s):   Current Facility-Administered Medications   Medication Dose Route Frequency    hydroCHLOROthiazide (HYDRODIURIL) tablet 25 mg  25 mg Oral DAILY    aspirin delayed-release tablet 81 mg  81 mg Oral DAILY    topiramate (TOPAMAX) tablet 50 mg  50 mg Oral QHS    pravastatin (PRAVACHOL) tablet 40 mg  40 mg Oral QHS    estradioL (ESTRACE) tablet 2 mg  2 mg Oral DAILY    metFORMIN (GLUCOPHAGE) tablet 500 mg  500 mg Oral BID WITH MEALS    insulin lispro (HUMALOG) injection   SubCUTAneous AC&HS    busPIRone (BUSPAR) tablet 10 mg  10 mg Oral BID    ARIPiprazole (ABILIFY) tablet 5 mg  5 mg Oral DAILY     The following labs have been completed for monitoring of antipsychotics and/or mood stabilizers:    Height, Weight, BMI Estimation  Estimated body mass index is 31.73 kg/m as calculated from the following:    Height as of this encounter: 165.1 cm (65").    Weight as of this encounter: 86.5 kg (190 lb 11.2 oz).     Vital Signs/Blood Pressure  Visit Vitals  BP 119/71   Pulse 82   Temp 97.5 F (36.4 C)   Resp 16   Ht 165.1 cm (65")   Wt 86.5 kg (190 lb 11.2 oz)   SpO2 97%   BMI 31.73 kg/m     Renal Function, Hepatic Function and Chemistry  Estimated Creatinine Clearance: 85.2 mL/min (by C-G formula based on SCr of 0.81 mg/dL).    Lab Results   Component Value Date/Time    Sodium 141 02/17/2021 03:05 AM    Potassium 3.6 02/17/2021 03:05 AM    Chloride 110 (H) 02/17/2021 03:05 AM    CO2 22 02/17/2021 03:05 AM    Anion gap 9 02/17/2021 03:05 AM    BUN 12 02/17/2021 03:05 AM    Creatinine 0.81 02/17/2021 03:05 AM    BUN/Creatinine ratio 15 02/17/2021 03:05 AM    Bilirubin, total 0.4 02/16/2021 02:45 AM    Protein, total 5.6 (L) 02/16/2021 02:45 AM    Albumin 2.9 (L) 02/16/2021 02:45 AM    Globulin 2.7 02/16/2021 02:45 AM    A-G Ratio 1.1 02/16/2021 02:45 AM    ALT (SGPT) 37 02/16/2021 02:45 AM    AST (SGOT) 28 02/16/2021  02:45 AM    Alk. phosphatase 80 02/16/2021 02:45 AM     Lab Results   Component Value Date/Time    Glucose 96 02/17/2021 03:05 AM    Glucose (POC) 115 02/23/2021 11:24 AM     Lab Results   Component Value Date/Time    Hemoglobin A1c 6.2 (H) 02/16/2021 02:45 AM     Hematology  Lab Results   Component Value Date/Time    WBC 7.5 02/17/2021 03:05 AM    RBC 4.51 02/17/2021 03:05 AM    HGB 11.8 02/17/2021 03:05 AM    HCT 38.2 02/17/2021 03:05 AM    MCV 84.7 02/17/2021 03:05 AM    MCH 26.2 02/17/2021 03:05 AM    MCHC 30.9 02/17/2021 03:05 AM    RDW 14.6 (H) 02/17/2021 03:05 AM    PLATELET 155 02/17/2021 03:05 AM     Lipids  Lab Results   Component Value Date/Time    Cholesterol, total 175 02/22/2021 08:13 AM    HDL Cholesterol 37 02/22/2021 08:13 AM  LDL, calculated 102.2 (H) 96/29/5284 08:13 AM    Triglyceride 179 (H) 02/22/2021 08:13 AM    CHOL/HDL Ratio 4.7 02/22/2021 08:13 AM     Thyroid Function  Lab Results   Component Value Date/Time    TSH 1.49 02/22/2021 08:13 AM     Assessment/Plan:  Recommended baseline laboratory monitoring has been completed based on this patient's current medication regimen. The patient's estimated 10-year ASCVD risk is 2.9%; therefore, the patient is not a candidate for a high-intensity statin. No further intervention needed at this time. Follow-up metabolic monitoring labs should be completed in 3 months (quarterly for the first year of antipsychotic therapy).     Ramonita Lab, PHARMD

## 2021-02-24 LAB — POCT GLUCOSE
POC Glucose: 155 mg/dL — ABNORMAL HIGH (ref 65–117)
POC Glucose: 126 mg/dL — ABNORMAL HIGH (ref 65–117)

## 2021-02-24 LAB — GLUCOSE, POC
Glucose (POC): 155 mg/dL — ABNORMAL HIGH (ref 65–117)
Glucose (POC): 126 mg/dL — ABNORMAL HIGH (ref 65–117)

## 2021-02-24 MED FILL — OLANZAPINE 5 MG TAB: 5 mg | ORAL | Qty: 1

## 2021-02-24 MED FILL — BUSPIRONE 10 MG TAB: 10 mg | ORAL | Qty: 1

## 2021-02-24 MED FILL — TOPIRAMATE 25 MG TAB: 25 mg | ORAL | Qty: 2

## 2021-02-24 MED FILL — METFORMIN 500 MG TAB: 500 mg | ORAL | Qty: 1

## 2021-02-24 MED FILL — ESTRADIOL 1 MG TAB: 1 mg | ORAL | Qty: 2

## 2021-02-24 MED FILL — ARIPIPRAZOLE 5 MG TAB: 5 mg | ORAL | Qty: 1

## 2021-02-24 MED FILL — HYDROCHLOROTHIAZIDE 25 MG TAB: 25 mg | ORAL | Qty: 1

## 2021-02-24 MED FILL — HYDROXYZINE 50 MG TAB: 50 mg | ORAL | Qty: 1

## 2021-02-24 MED FILL — ASPIRIN 81 MG TAB, DELAYED RELEASE: 81 mg | ORAL | Qty: 1

## 2021-02-24 MED FILL — TRAZODONE 50 MG TAB: 50 mg | ORAL | Qty: 1

## 2021-02-24 MED FILL — PRAVASTATIN 40 MG TAB: 40 mg | ORAL | Qty: 1

## 2021-02-24 NOTE — Progress Notes (Signed)
Problem: Depressed Mood (Adult/Pediatric)  Goal: *STG: Participates in treatment plan  Outcome: Progressing Towards Goal  Note: Pt out on the unit and interacting with peers appropriately. Mood is brighter and does not have any thoughts of self harm. Plan for discharge today.  Goal: Interventions  Outcome: Progressing Towards Goal     Problem: Patient Education: Go to Patient Education Activity  Goal: Patient/Family Education  Outcome: Progressing Towards Goal     Problem: Falls - Risk of  Goal: *Absence of Falls  Description: Document Bridgette Habermann Fall Risk and appropriate interventions in the flowsheet.  Outcome: Progressing Towards Goal  Note: Fall Risk Interventions:  Mobility Interventions: Assess mobility with egress test         Medication Interventions: Teach patient to arise slowly    Elimination Interventions: Toilet paper/wipes in reach    History of Falls Interventions: Door open when patient unattended         Problem: Patient Education: Go to Patient Education Activity  Goal: Patient/Family Education  Outcome: Progressing Towards Goal

## 2021-02-24 NOTE — Behavioral Health Treatment Team (Signed)
Pt discharged with significant other. No distress noted. Belongings returned to pt. Discharge instructions reviewed and signed with pt.

## 2021-03-01 ENCOUNTER — Encounter

## 2021-03-01 NOTE — Telephone Encounter (Signed)
Pt is requesting an earlier appt or a phone call from Angela-please review and call pt

## 2021-03-08 ENCOUNTER — Ambulatory Visit: Admit: 2021-03-08 | Discharge: 2021-03-08 | Payer: MEDICARE | Attending: Registered Nurse | Primary: Family

## 2021-03-08 ENCOUNTER — Ambulatory Visit: Attending: Registered Nurse | Primary: Family

## 2021-03-08 DIAGNOSIS — G4700 Insomnia, unspecified: Secondary | ICD-10-CM

## 2021-03-08 MED ORDER — BUSPIRONE 10 MG TAB
10 mg | ORAL_TABLET | Freq: Three times a day (TID) | ORAL | 0 refills | Status: DC
Start: 2021-03-08 — End: 2021-03-22

## 2021-03-08 MED ORDER — TRAZODONE 100 MG TAB
100 mg | ORAL_TABLET | Freq: Every evening | ORAL | 0 refills | Status: DC | PRN
Start: 2021-03-08 — End: 2021-04-19

## 2021-03-08 NOTE — Progress Notes (Signed)
Mallory Bonilla is a 55 y.o. female who presents today for the following:  Chief Complaint   Patient presents with   ??? Follow-up     "My anxiety is through the roof."   ??? Anxiety   ??? Bipolar       No Known Allergies    Current Outpatient Medications   Medication Sig   ??? busPIRone (BUSPAR) 10 mg tablet Take 1 Tablet by mouth three (3) times daily for 90 days. Indications: repeated episodes of anxiety   ??? traZODone (DESYREL) 100 mg tablet Take 1 Tablet by mouth nightly as needed for Sleep for up to 90 days. Indications: insomnia associated with depression   ??? ARIPiprazole (ABILIFY) 5 mg tablet Take 1 Tablet by mouth nightly for 90 days. Indications: additional treatment for major depressive disorder   ??? topiramate (Topamax) 50 mg tablet Take 1 Tablet by mouth nightly. Indications: migraine prevention   ??? hydroCHLOROthiazide (HYDRODIURIL) 25 mg tablet Take 1 Tablet by mouth daily. Indications: high blood pressure   ??? pravastatin (PRAVACHOL) 40 mg tablet Take 40 mg by mouth nightly.   ??? canagliflozin-metFORMIN (Invokamet) 150-500 mg tab Take  by mouth two (2) times a day.   ??? estradioL (ESTRACE) 2 mg tablet Take  by mouth daily.   ??? aspirin delayed-release 81 mg tablet Take 81 mg by mouth daily.     No current facility-administered medications for this visit.       Past Medical History:   Diagnosis Date   ??? Anxiety 01/11/2021   ??? Arthritis    ??? Bipolar 1 disorder (HCC) 01/11/2021   ??? Diabetes (HCC)    ??? Diabetes mellitus type 2, controlled (HCC) 01/11/2021   ??? Headache    ??? Headache 01/11/2021   ??? Hypercholesterolemia    ??? Hyperlipidemia 01/11/2021   ??? Hypertension    ??? Hypertension 01/11/2021       Past Surgical History:   Procedure Laterality Date   ??? HX OOPHORECTOMY         Family History   Problem Relation Age of Onset   ??? Diabetes Mother    ??? Cancer Mother    ??? Diabetes Sister    ??? Heart Disease Sister    ??? Cancer Sister    ??? Diabetes Brother    ??? Heart Disease Brother        Social History     Socioeconomic History   ???  Marital status: DIVORCED     Spouse name: Not on file   ??? Number of children: Not on file   ??? Years of education: Not on file   ??? Highest education level: Not on file   Occupational History   ??? Not on file   Tobacco Use   ??? Smoking status: Never Smoker   ??? Smokeless tobacco: Never Used   Substance and Sexual Activity   ??? Alcohol use: Not Currently   ??? Drug use: Not Currently   ??? Sexual activity: Yes   Other Topics Concern   ??? Not on file   Social History Narrative   ??? Not on file     Social Determinants of Health     Financial Resource Strain:    ??? Difficulty of Paying Living Expenses: Not on file   Food Insecurity:    ??? Worried About Running Out of Food in the Last Year: Not on file   ??? Ran Out of Food in the Last Year: Not on file   Transportation Needs:    ???  Lack of Transportation (Medical): Not on file   ??? Lack of Transportation (Non-Medical): Not on file   Physical Activity:    ??? Days of Exercise per Week: Not on file   ??? Minutes of Exercise per Session: Not on file   Stress:    ??? Feeling of Stress : Not on file   Social Connections:    ??? Frequency of Communication with Friends and Family: Not on file   ??? Frequency of Social Gatherings with Friends and Family: Not on file   ??? Attends Religious Services: Not on file   ??? Active Member of Clubs or Organizations: Not on file   ??? Attends Banker Meetings: Not on file   ??? Marital Status: Not on file   Intimate Partner Violence:    ??? Fear of Current or Ex-Partner: Not on file   ??? Emotionally Abused: Not on file   ??? Physically Abused: Not on file   ??? Sexually Abused: Not on file   Housing Stability:    ??? Unable to Pay for Housing in the Last Year: Not on file   ??? Number of Places Lived in the Last Year: Not on file   ??? Unstable Housing in the Last Year: Not on file         Mallory Bonilla follows up in clinic for bipolar I disorder and anxiety disorder.  [name] take 1 tablet daily, trazodone 50 mg take 1 tablet at bedtime as  needed for sleep and BuSpar 10 mg tablet take 1 tablet twice daily.  Patient last visited the clinic January 11, 2021.  Since last visit patient was hospitalized in [facility] February 22-26 for suicide attempt by overdose on doxepin and Vistaril.  Patient reports that her partner keeps her medications locked up since she was discharged from the hospital.    [name], clean fully alert and oriented.  Gait is stable.  Mood is moderately anxious, she denies feeling depressed.  She reports having anxiety since hospital discharge.  She denies having a panic attack however she is afraid that she will in the near future.  Patient is working with her counselor [name] about her fears especially agoraphobia.  She mentions she and her partner are doing couple counseling.  No psychotic symptoms noted.  She reports poor sleep.  No issues with appetite noted.  Patient reports since coming off of some of her medication she feels shaky on the inside, foggy and confused.  Significant other reports that she is "jumpy and snappy."  [name] to help reduce anxiety symptoms.  Her request was respectfully declined based on history and risks associated with benzodiazepine use. She is receptive to adjusting BuSpar and trazodone dosage.  [name] even though she still feels anxious.  [name].  [name].  We discussed the seriousness of the overdose attempt which patient verbalizes understanding, however minimization is noted.        Review of Systems   Psychiatric/Behavioral: [name] and has insomnia.    All other systems reviewed and are negative.        Visit Vitals  Wt 84.9 kg (187 lb 3.2 oz)   BMI 31.15 kg/m??     Physical Exam  Psychiatric:         Attention  and Perception: Attention and perception normal.          Mood and Affect: Mood is anxious.         Speech: Speech normal.         Behavior: Behavior normal. Behavior is cooperative.         Thought Content: Thought content normal.         Cognition and Memory: Cognition normal. Memory is impaired (mild).         Judgment: Judgment normal.          Plan:    For anxiety-change BuSpar 10 mg to 1 tablet 3 times daily.  For sleep-change trazodone to 100 mg take 1 tablet at bedtime as needed for sleep.  Continue Abilify.  Continue counseling.  I will provide special accommodations work letter if needed.  Patient will follow-up as appropriate.  Follow-up with medical provider as appropriate.  For emergencies-call 911 or go to the emergency department.

## 2021-03-13 NOTE — Telephone Encounter (Signed)
Patient called and said that since being off the Clonazepam she has been having heart flutters. Patient is requesting a phone call.

## 2021-03-20 ENCOUNTER — Encounter

## 2021-03-22 ENCOUNTER — Ambulatory Visit: Admit: 2021-03-22 | Discharge: 2021-03-22 | Payer: MEDICARE | Attending: Registered Nurse | Primary: Family

## 2021-03-22 ENCOUNTER — Ambulatory Visit: Attending: Registered Nurse | Primary: Family

## 2021-03-22 DIAGNOSIS — F411 Generalized anxiety disorder: Secondary | ICD-10-CM

## 2021-03-22 MED ORDER — BUSPIRONE 15 MG TAB
15 mg | ORAL_TABLET | Freq: Three times a day (TID) | ORAL | 0 refills | Status: DC
Start: 2021-03-22 — End: 2021-04-19

## 2021-03-22 NOTE — Progress Notes (Signed)
Mallory Bonilla is a 55 y.o. female who presents today for the following:  Chief Complaint   Patient presents with   ??? Follow-up     I ain't doing good. I'm panicking all the time."   ??? Anxiety   ??? Bipolar   ??? Depression       No Known Allergies    Current Outpatient Medications   Medication Sig   ??? busPIRone (BUSPAR) 15 mg tablet Take 1 Tablet by mouth three (3) times daily for 90 days. Indications: repeated episodes of anxiety   ??? traZODone (DESYREL) 100 mg tablet Take 1 Tablet by mouth nightly as needed for Sleep for up to 90 days. Indications: insomnia associated with depression   ??? ARIPiprazole (ABILIFY) 5 mg tablet Take 1 Tablet by mouth nightly for 90 days. Indications: additional treatment for major depressive disorder   ??? topiramate (Topamax) 50 mg tablet Take 1 Tablet by mouth nightly. Indications: migraine prevention   ??? hydroCHLOROthiazide (HYDRODIURIL) 25 mg tablet Take 1 Tablet by mouth daily. Indications: high blood pressure   ??? pravastatin (PRAVACHOL) 40 mg tablet Take 40 mg by mouth nightly.   ??? canagliflozin-metFORMIN (Invokamet) 150-500 mg tab Take  by mouth two (2) times a day.   ??? estradioL (ESTRACE) 2 mg tablet Take  by mouth daily.   ??? aspirin delayed-release 81 mg tablet Take 81 mg by mouth daily.     No current facility-administered medications for this visit.       Past Medical History:   Diagnosis Date   ??? Anxiety 01/11/2021   ??? Arthritis    ??? Bipolar 1 disorder (HCC) 01/11/2021   ??? Diabetes (HCC)    ??? Diabetes mellitus type 2, controlled (HCC) 01/11/2021   ??? Headache    ??? Headache 01/11/2021   ??? Hypercholesterolemia    ??? Hyperlipidemia 01/11/2021   ??? Hypertension    ??? Hypertension 01/11/2021       Past Surgical History:   Procedure Laterality Date   ??? HX OOPHORECTOMY         Family History   Problem Relation Age of Onset   ??? Diabetes Mother    ??? Cancer Mother    ??? Diabetes Sister    ??? Heart Disease Sister    ??? Cancer Sister    ??? Diabetes Brother    ??? Heart Disease Brother        Social History      Socioeconomic History   ??? Marital status: DIVORCED     Spouse name: Not on file   ??? Number of children: Not on file   ??? Years of education: Not on file   ??? Highest education level: Not on file   Occupational History   ??? Not on file   Tobacco Use   ??? Smoking status: Never Smoker   ??? Smokeless tobacco: Never Used   Substance and Sexual Activity   ??? Alcohol use: Not Currently   ??? Drug use: Not Currently   ??? Sexual activity: Yes   Other Topics Concern   ??? Not on file   Social History Narrative   ??? Not on file     Social Determinants of Health     Financial Resource Strain:    ??? Difficulty of Paying Living Expenses: Not on file   Food Insecurity:    ??? Worried About Running Out of Food in the Last Year: Not on file   ??? Ran Out of Food in the Last Year: Not  on file   Transportation Needs:    ??? Lack of Transportation (Medical): Not on file   ??? Lack of Transportation (Non-Medical): Not on file   Physical Activity:    ??? Days of Exercise per Week: Not on file   ??? Minutes of Exercise per Session: Not on file   Stress:    ??? Feeling of Stress : Not on file   Social Connections:    ??? Frequency of Communication with Friends and Family: Not on file   ??? Frequency of Social Gatherings with Friends and Family: Not on file   ??? Attends Religious Services: Not on file   ??? Active Member of Clubs or Organizations: Not on file   ??? Attends Banker Meetings: Not on file   ??? Marital Status: Not on file   Intimate Partner Violence:    ??? Fear of Current or Ex-Partner: Not on file   ??? Emotionally Abused: Not on file   ??? Physically Abused: Not on file   ??? Sexually Abused: Not on file   Housing Stability:    ??? Unable to Pay for Housing in the Last Year: Not on file   ??? Number of Places Lived in the Last Year: Not on file   ??? Unstable Housing in the Last Year: Not on file         Ms. Hillier follows up in clinic for bipolar I disorder and anxiety disorder.  [name] take 1 tablet daily, trazodone 100 mg  take 1 tablet at bedtime as needed for sleep and BuSpar 10 mg tablet take 1 tablet three times daily.  Patient last visited the clinic March 08, 2021.    Patient presents to the clinic accompanied by her significant other Mallory Bonilla.  She is clean, fully alert and oriented.  Noted patient to be moderate to severely anxious and tearful at times during interview.  She is requesting to restart clonazepam for anxiety and panic attacks.  Patient reports that anxiety interferes with daily living and she is having issues with working.  She also reports that she is unable to go out to eat with her family due to increased anxiety.  She reports trouble sleeping.  Appetite is stable.  No psychotic symptoms observed or reported.  No active suicidal/homicidal thinking, risk for suicide is moderate to high based on history but there is no acute concern at this time.  It is noted patient reports overdosing on Vistaril and doxepin in February.  Patient is preoccupied with restarting clonazepam.  We discussed cognitive behavioral therapy which she is receiving counseling services with Mrs. Gatten.  She is receptive to increasing BuSpar to help deal with anxiety symptoms.  No alcohol or drug use reported.  Patient lives at home with her significant other in a stable home environment.        Review of Systems   Psychiatric/Behavioral: Positive for depression. [name].    All other systems reviewed and are negative.        Visit Vitals  Wt 84.4 kg (186 lb)   BMI 30.95 kg/m??     Physical Exam  Psychiatric:         Attention and Perception: Attention and perception normal.         Mood and Affect: Mood is anxious and depressed.         Speech: Speech normal.         Behavior: Behavior normal. Behavior is  cooperative.         Thought Content: Thought content normal.         Cognition and Memory: Cognition and memory normal.         Judgment: Judgment normal.        Plan:    For anxiety and panic attacks-change BuSpar to  15 mg take 1 tablet 3 times daily.  She may continue the above medications as prescribed.  Follow-up on cognitive behavior therapy recommendation.  Follow-up with medical provider as appropriate.  For emergencies-call 911 or go to the emergency department.

## 2021-03-23 DIAGNOSIS — F329 Major depressive disorder, single episode, unspecified: Secondary | ICD-10-CM

## 2021-03-23 DIAGNOSIS — F418 Other specified anxiety disorders: Secondary | ICD-10-CM

## 2021-03-23 NOTE — ED Notes (Signed)
Pt stated to nurse while nurse was doing EKG that she takes back what she said earlier about not feeling suicidal. Pt states she feels suicidal on and off. States not suicidal now but was suicidal earlier before entering her ER room. Notified Dr. Denton Meek regarding this conversation with patient.

## 2021-03-23 NOTE — ED Provider Notes (Signed)
ED Provider Notes by Mallory Bonilla, Mallory Gasner, MD at 03/23/21 2215                Author: Matthias Bonilla, Mallory Alamo, MD  Service: EMERGENCY  Author Type: Physician       Filed: 03/23/21 2329  Date of Service: 03/23/21 2215  Status: Addendum          Editor: Mallory Bonilla, Tajah Schreiner, MD (Physician)          Related Notes: Original Note by Mallory Bonilla, Valmore Arabie, MD (Physician) filed at 03/23/21 2233               EMERGENCY DEPARTMENT HISTORY AND PHYSICAL EXAM           Date: 03/23/2021   Patient Name: Mallory Bonilla        History of Presenting Illness          Chief Complaint       Patient presents with        ?  Anxiety     ?  Chest Pain        ?  Shortness of Breath           History Provided By: Patient      HPI: Mallory Bonilla,  55 y.o. female with a past medical history significant  seizure and anxiety presents to the ED with cc of anxiety attack, increased heart race and mild SOB off and on today. No chest pain. Patient is under a lot of stress and depressed without suicidial  ideation. Patient doesn't have and medication for her panic attacks. [facility] an apparent overdose at the behavior health for 4 days..  Patient was taken of clonazepam.       There are no other complaints, changes, or physical findings at this time.      PCP: Mallory Bonilla, Mallory W, NP        No current facility-administered medications on file prior to encounter.          Current Outpatient Medications on File Prior to Encounter          Medication  Sig  Dispense  Refill           ?  busPIRone (BUSPAR) 15 mg tablet  Take 1 Tablet by mouth three (3) times daily for 90 days. Indications: repeated episodes of anxiety  270 Tablet  0     ?  traZODone (DESYREL) 100 mg tablet  Take 1 Tablet by mouth nightly as needed for Sleep for up to 90 days. Indications: insomnia associated with depression  90 Tablet  0           ?  ARIPiprazole (ABILIFY) 5 mg tablet  Take 1 Tablet by mouth nightly for 90 days. Indications: additional  treatment for major depressive disorder  30 Tablet  0           ?  topiramate (Topamax) 50 mg tablet  Take 1 Tablet by mouth nightly. Indications: migraine prevention  30 Tablet  0     ?  hydroCHLOROthiazide (HYDRODIURIL) 25 mg tablet  Take 1 Tablet by mouth daily. Indications: high blood pressure  30 Tablet  0     ?  pravastatin (PRAVACHOL) 40 mg tablet  Take 40 mg by mouth nightly.         ?  canagliflozin-metFORMIN (Invokamet) 150-500 mg tab  Take  by mouth two (2) times a day.         ?  estradioL (ESTRACE)  2 mg tablet  Take  by mouth daily.               ?  aspirin delayed-release 81 mg tablet  Take 81 mg by mouth daily.                 Past History        Past Medical History:     Past Medical History:        Diagnosis  Date         ?  Anxiety  01/11/2021     ?  Arthritis       ?  Bipolar 1 disorder (HCC)  01/11/2021     ?  Diabetes (HCC)       ?  Diabetes mellitus type 2, controlled (HCC)  01/11/2021     ?  Headache       ?  Headache  01/11/2021     ?  Hypercholesterolemia       ?  Hyperlipidemia  01/11/2021     ?  Hypertension           ?  Hypertension  01/11/2021           Past Surgical History:     Past Surgical History:         Procedure  Laterality  Date          ?  HX OOPHORECTOMY               Family History:     Family History         Problem  Relation  Age of Onset          ?  Diabetes  Mother       ?  Cancer  Mother       ?  Diabetes  Sister       ?  Heart Disease  Sister       ?  Cancer  Sister       ?  Diabetes  Brother            ?  Heart Disease  Brother             Social History:     Social History          Tobacco Use         ?  Smoking status:  Never Smoker     ?  Smokeless tobacco:  Never Used       Substance Use Topics         ?  Alcohol use:  Not Currently         ?  Drug use:  Not Currently           Allergies:   No Known Allergies           Review of Systems        Review of Systems    Constitutional: Negative.     HENT: Negative.     Eyes: Negative.     Respiratory: Positive for shortness of  breath. Negative for apnea, cough, chest tightness and wheezing.     Cardiovascular: Positive for palpitations. Negative for chest pain.    Gastrointestinal: Negative.     Endocrine: Negative.     Genitourinary: Negative.     Musculoskeletal: Negative.     Skin: Negative.     Allergic/Immunologic: Negative.     Neurological: Negative.     Hematological: Negative.  Psychiatric/Behavioral: Negative.             Physical Exam     Physical Exam   Vitals and nursing note reviewed.   Constitutional:        Appearance: Normal appearance.   HENT :       Head: Normocephalic.      Right Ear: Tympanic membrane normal.      Left Ear: Tympanic membrane normal.      Nose: Nose normal.      Mouth/Throat:      Mouth: Mucous membranes are moist.   Eyes:       Pupils: Pupils are equal, round, and reactive to light.   Cardiovascular:       Rate and Rhythm: Normal rate.      Pulses: Normal pulses.      Heart sounds: No friction rub. No gallop. No S3 or S4 sounds.     Pulmonary:       Effort: Pulmonary effort is normal.   Chest :       Chest wall: No deformity, tenderness, crepitus or edema. There is no dullness to percussion.    Abdominal:      Mallory Bonilla: Abdomen is flat. Bowel sounds are normal.      Palpations: Abdomen is soft.     Musculoskeletal:          Mallory Bonilla: Normal range of motion.      Cervical back: Normal range of motion and neck supple.      Right lower leg: No tenderness. No edema.      Left lower leg: No tenderness. No edema.    Skin:      Mallory Bonilla: Skin is warm.      Capillary Refill: Capillary refill takes less than 2 seconds.    Neurological:       Mallory Bonilla: No focal deficit present.      Mental Status: She is alert.    Psychiatric:         Mood and Affect: Mood normal.               Lab and Diagnostic Study Results        Labs -         Recent Results (from the past 12 hour(s))     EKG, 12 LEAD, INITIAL          Collection Time: 03/23/21 10:03 PM         Result  Value  Ref Range            Ventricular Rate  85  BPM        Atrial Rate  85  BPM       P-R Interval  140  ms       QRS Duration  104  ms       Q-T Interval  391  ms       QTC Calculation (Bezet)  465  ms       Calculated P Axis  37  degrees       Calculated R Axis  32  degrees       Calculated T Axis  53  degrees            Diagnosis  Sinus rhythm   RSR' in V1 or V2, right VCD or RVH                Radiologic Studies -    @lastxrresult @     CT Results   (  Last 48 hours)          None                 CXR Results   (Last 48 hours)          None                       Medical Decision Making     - I am the first provider for this patient.      - I reviewed the vital signs, available nursing notes, past medical history, past surgical history, family history and social history.      - Initial assessment performed. The patients presenting problems have been discussed, and they are in agreement with the care plan formulated and outlined with them.  I have encouraged them to ask questions as they arise throughout their visit.      Vital Signs-Reviewed the patient's vital signs.   Patient Vitals for the past 12 hrs:            Temp  Pulse  Resp  BP  SpO2            03/23/21 2121  96.8 ??F (36 ??C)  95  18  (!) 172/81  95 %           Records Reviewed: Nursing Notes      The patient presents with anxiety  with a differential diagnosis of panic attack, MI, anxiety, hyperthyroidism      ED Course:              Provider Notes (Medical Decision Making):       MDM            Procedures     Medical Decision Makingedical Decision Making   Performed by: Mallory Hughs, MD   PROCEDURES:Procedures            Disposition     Disposition: Condition stable   DC- Adult Discharges: All of the diagnostic tests were reviewed and questions answered. Diagnosis, care plan and treatment options were discussed.  The patient understands the instructions and will follow up as directed. The patients results have been  reviewed with them.  They have been counseled regarding their diagnosis.  The patient  verbally convey understanding and agreement of the signs, symptoms, diagnosis, treatment and prognosis and additionally agrees to follow up as recommended with their  PCP in 24 - 48 hours.  They also agree with the care-plan and convey that all of their questions have been answered.  I have also put together some discharge instructions for them that include: 1) educational information regarding their diagnosis, 2)  how to care for their diagnosis at home, as well a 3) list of reasons why they would want to return to the ED prior to their follow-up appointment, should their condition change.            DISCHARGE PLAN:   1.      Current Discharge Medication List              CONTINUE these medications which have NOT CHANGED          Details        busPIRone (BUSPAR) 15 mg tablet  Take 1 Tablet by mouth three (3) times daily for 90 days. Indications: repeated episodes of anxiety   Qty: 270 Tablet, Refills: 0          Associated Diagnoses: Generalized anxiety  disorder               traZODone (DESYREL) 100 mg tablet  Take 1 Tablet by mouth nightly as needed for Sleep for up to 90 days. Indications: insomnia associated with depression   Qty: 90 Tablet, Refills: 0          Associated Diagnoses: Insomnia, unspecified type               ARIPiprazole (ABILIFY) 5 mg tablet  Take 1 Tablet by mouth nightly for 90 days. Indications: additional treatment for major depressive disorder   Qty: 30 Tablet, Refills: 0          Associated Diagnoses: Depression, unspecified depression type               topiramate (Topamax) 50 mg tablet  Take 1 Tablet by mouth nightly. Indications: migraine prevention   Qty: 30 Tablet, Refills: 0               hydroCHLOROthiazide (HYDRODIURIL) 25 mg tablet  Take 1 Tablet by mouth daily. Indications: high blood pressure   Qty: 30 Tablet, Refills: 0               pravastatin (PRAVACHOL) 40 mg tablet  Take 40 mg by mouth nightly.               canagliflozin-metFORMIN (Invokamet) 150-500 mg tab  Take  by  mouth two (2) times a day.               estradioL (ESTRACE) 2 mg tablet  Take  by mouth daily.               aspirin delayed-release 81 mg tablet  Take 81 mg by mouth daily.                      2.      Follow-up Information      None             3.  Return to ED if worse    4.      Current Discharge Medication List                       Diagnosis        Clinical Impression:              ICD-10-CM  ICD-9-CM             1.  Anxiety associated with depression   F41.8  300.4            Patient is medically cleared     Attestations:      Mallory Hughs, MD      Please note that this dictation was completed with Dragon, the computer voice recognition software.  Quite often unanticipated grammatical, syntax, homophones, and other interpretive errors are inadvertently  transcribed by the computer software.  Please disregard these errors.  Please excuse any errors that have escaped final proofreading.  Thank you.

## 2021-03-23 NOTE — ED Notes (Signed)
Pt states she is experiencing anxiety, chest pain, and shortness of breath. Pt states she took buspar at 2000 and starting to feel a little better.

## 2021-03-24 ENCOUNTER — Inpatient Hospital Stay
Admit: 2021-03-24 | Discharge: 2021-03-29 | Disposition: A | Discharge status: Home / Self Care | Payer: MEDICARE | Attending: Psychiatry | Admitting: Psychiatry

## 2021-03-24 LAB — COMPREHENSIVE METABOLIC PANEL
ALT: 41 U/L (ref 12–78)
AST: 31 U/L (ref 15–37)
Albumin/Globulin Ratio: 1 — ABNORMAL LOW (ref 1.1–2.2)
Albumin: 3.5 g/dL (ref 3.5–5.0)
Alkaline Phosphatase: 94 U/L (ref 45–117)
Anion Gap: 9 mmol/L (ref 5–15)
BUN: 14 mg/dL (ref 6–20)
Bun/Cre Ratio: 18 (ref 12–20)
CO2: 26 mmol/L (ref 21–32)
Calcium: 8.9 mg/dL (ref 8.5–10.1)
Chloride: 104 mmol/L (ref 97–108)
Creatinine: 0.77 mg/dL (ref 0.55–1.02)
EGFR IF NonAfrican American: >60 mL/min/{1.73_m2} (ref >60)
GFR African American: >60 mL/min/{1.73_m2} (ref >60)
Globulin: 3.4 g/dL (ref 2.0–4.0)
Glucose: 128 mg/dL — ABNORMAL HIGH (ref 65–100)
Potassium: 3.3 mmol/L — ABNORMAL LOW (ref 3.5–5.1)
Sodium: 139 mmol/L (ref 136–145)
Total Bilirubin: 0.3 mg/dL (ref 0.2–1.0)
Total Protein: 6.9 g/dL (ref 6.4–8.2)

## 2021-03-24 LAB — CBC WITH AUTO DIFFERENTIAL
Basophils %: 1 % (ref 0.0–2.5)
Basophils Absolute: 0.1 10*3/uL (ref 0.0–0.2)
Eosinophils %: 3 % — ABNORMAL HIGH (ref 0.9–2.9)
Eosinophils Absolute: 0.2 10*3/uL (ref 0.0–0.7)
Hematocrit: 42.9 % (ref 36–46)
Hemoglobin: 13.9 g/dL (ref 13.5–17.5)
Lymphocytes %: 37 % (ref 20.5–51.1)
Lymphocytes Absolute: 2.7 10*3/uL (ref 1.0–4.8)
MCH: 25.6 PG — ABNORMAL LOW (ref 31–34)
MCHC: 32.4 g/dL (ref 31.0–36.0)
MCV: 79 FL — ABNORMAL LOW (ref 80–100)
MPV: 8.9 FL (ref 6.5–11.5)
Monocytes %: 6 % (ref 1.7–9.3)
Monocytes Absolute: 0.5 10*3/uL (ref 0.2–2.4)
NRBC Absolute: 0.01 10*3/uL
Neutrophils %: 53 % (ref 42–75)
Neutrophils Absolute: 3.9 10*3/uL (ref 1.8–7.7)
Nucleated RBCs: 0.2 PER 100 WBC
Platelets: 163 10*3/uL (ref 150–400)
RBC: 5.43 M/uL (ref 4.50–5.90)
RDW: 14 % (ref 11.5–14.5)
WBC: 7.3 10*3/uL (ref 4.4–11.3)

## 2021-03-24 LAB — URINALYSIS W/ RFLX MICROSCOPIC
Bilirubin, Urine: NEGATIVE
Bilirubin: NEGATIVE
Blood, Urine: NEGATIVE
Blood: NEGATIVE
Glucose, Ur: 250 mg/dL — AB
Glucose: 250 mg/dL — AB
Ketone: NEGATIVE mg/dL
Ketones, Urine: NEGATIVE mg/dL
Nitrite, Urine: NEGATIVE
Nitrites: NEGATIVE
Protein, UA: NEGATIVE mg/dL
Protein: NEGATIVE mg/dL
Specific Gravity, UA: >1.03 NA — ABNORMAL HIGH (ref 1.003–1.030)
Specific gravity: >1.03
Specific gravity: >1.03 — ABNORMAL HIGH (ref 1.003–1.030)
Urobilinogen, UA, POCT: 0.2 EU/dL (ref 0.2–1.0)
Urobilinogen: 0.2 EU/dL (ref 0.2–1.0)
pH (UA): 6 (ref 5.0–8.0)
pH, UA: 6 (ref 5.0–8.0)

## 2021-03-24 LAB — SALICYLATE
Salicylate level: <1.7 mg/dL — ABNORMAL LOW (ref 2.8–20.0)
Salicylate: <1.7 mg/dL — ABNORMAL LOW (ref 2.8–20.0)

## 2021-03-24 LAB — ETHYL ALCOHOL
ALCOHOL(ETHYL),SERUM: <4 mg/dL (ref <10)
Ethyl Alcohol: <4 mg/dL (ref <10)

## 2021-03-24 LAB — URINE MICROSCOPIC

## 2021-03-24 LAB — COVID-19 WITH INFLUENZA A/B
Influenza A By PCR: NOT DETECTED
Influenza A by PCR: NOT DETECTED
Influenza B By PCR: NOT DETECTED
Influenza B by PCR: NOT DETECTED
SARS-CoV-2 by PCR: NOT DETECTED
SARS-CoV-2: NOT DETECTED

## 2021-03-24 LAB — DRUG SCREEN, URINE
AMPHETAMINES: NEGATIVE
Amphetamine Screen, Urine: NEGATIVE
BARBITURATES: NEGATIVE
BENZODIAZEPINES: POSITIVE — AB
Barbiturate Screen, Urine: NEGATIVE
Benzodiazepine Screen, Urine: POSITIVE — AB
COCAINE: NEGATIVE
Cocaine Screen Urine: NEGATIVE
ECSTASY, ECST: NEGATIVE
ECSTASY, MDMA: NEGATIVE
METHADONE: NEGATIVE
Methadone Screen, Urine: NEGATIVE
OPIATES: NEGATIVE
Opiate Screen, Urine: NEGATIVE
PCP Screen, Urine: NEGATIVE
PCP(PHENCYCLIDINE): NEGATIVE
THC (TH-CANNABINOL): NEGATIVE
THC Screen, Urine: NEGATIVE

## 2021-03-24 LAB — ACETAMINOPHEN LEVEL: Acetaminophen Level: <10 ug/mL — ABNORMAL LOW (ref 10–30)

## 2021-03-24 LAB — CBC WITH AUTOMATED DIFF
ABS. BASOPHILS: 0.1 10*3/uL (ref 0.0–0.2)
ABS. EOSINOPHILS: 0.2 10*3/uL (ref 0.0–0.7)
ABS. LYMPHOCYTES: 2.7 10*3/uL (ref 1.0–4.8)
ABS. MONOCYTES: 0.5 10*3/uL (ref 0.2–2.4)
ABS. NEUTROPHILS: 3.9 10*3/uL (ref 1.8–7.7)
ABSOLUTE NRBC: 0.01 10*3/uL
BASOPHILS: 1 % (ref 0.0–2.5)
EOSINOPHILS: 3 % — ABNORMAL HIGH (ref 0.9–2.9)
HCT: 42.9 % (ref 36–46)
HGB: 13.9 g/dL (ref 13.5–17.5)
LYMPHOCYTES: 37 % (ref 20.5–51.1)
MCH: 25.6 PG — ABNORMAL LOW (ref 31–34)
MCHC: 32.4 g/dL (ref 31.0–36.0)
MCV: 79 FL — ABNORMAL LOW (ref 80–100)
MONOCYTES: 6 % (ref 1.7–9.3)
MPV: 8.9 FL (ref 6.5–11.5)
NEUTROPHILS: 53 % (ref 42–75)
NRBC: 0.2 PER 100 WBC
PLATELET: 163 10*3/uL (ref 150–400)
RBC: 5.43 M/uL (ref 4.50–5.90)
RDW: 14 % (ref 11.5–14.5)
WBC: 7.3 10*3/uL (ref 4.4–11.3)

## 2021-03-24 LAB — METABOLIC PANEL, COMPREHENSIVE
A-G Ratio: 1 — ABNORMAL LOW (ref 1.1–2.2)
ALT (SGPT): 41 U/L (ref 12–78)
AST (SGOT): 31 U/L (ref 15–37)
Albumin: 3.5 g/dL (ref 3.5–5.0)
Alk. phosphatase: 94 U/L (ref 45–117)
Anion gap: 9 mmol/L (ref 5–15)
BUN/Creatinine ratio: 18 (ref 12–20)
BUN: 14 mg/dL (ref 6–20)
Bilirubin, total: 0.3 mg/dL (ref 0.2–1.0)
CO2: 26 mmol/L (ref 21–32)
Calcium: 8.9 mg/dL (ref 8.5–10.1)
Chloride: 104 mmol/L (ref 97–108)
Creatinine: 0.77 mg/dL (ref 0.55–1.02)
GFR est AA: >60 mL/min/{1.73_m2} (ref >60)
GFR est non-AA: >60 mL/min/{1.73_m2} (ref >60)
Globulin: 3.4 g/dL (ref 2.0–4.0)
Glucose: 128 mg/dL — ABNORMAL HIGH (ref 65–100)
Potassium: 3.3 mmol/L — ABNORMAL LOW (ref 3.5–5.1)
Protein, total: 6.9 g/dL (ref 6.4–8.2)
Sodium: 139 mmol/L (ref 136–145)

## 2021-03-24 LAB — ACETAMINOPHEN: Acetaminophen level: <10 ug/mL — ABNORMAL LOW (ref 10–30)

## 2021-03-24 MED ORDER — BUSPIRONE 5 MG TAB
5 mg | Freq: Three times a day (TID) | ORAL | Status: DC
Start: 2021-03-24 — End: 2021-03-29
  Administered 2021-03-24 – 2021-03-29 (×17): via ORAL

## 2021-03-24 MED ORDER — LISINOPRIL 10 MG TAB
10 mg | Freq: Every day | ORAL | Status: DC
Start: 2021-03-24 — End: 2021-03-25
  Administered 2021-03-24: 20:00:00 via ORAL

## 2021-03-24 MED ORDER — DIPHENHYDRAMINE HCL 50 MG/ML IJ SOLN
50 mg/mL | Freq: Two times a day (BID) | INTRAMUSCULAR | Status: DC | PRN
Start: 2021-03-24 — End: 2021-03-29

## 2021-03-24 MED ORDER — HYDROXYZINE 50 MG TAB
50 mg | Freq: Three times a day (TID) | ORAL | Status: DC | PRN
Start: 2021-03-24 — End: 2021-03-29
  Administered 2021-03-27: 04:00:00 via ORAL

## 2021-03-24 MED ORDER — LORAZEPAM 1 MG TAB
1 mg | ORAL | Status: AC
Start: 2021-03-24 — End: 2021-03-23
  Administered 2021-03-24: 02:00:00 via ORAL

## 2021-03-24 MED ORDER — CEFTRIAXONE 1 GRAM SOLUTION FOR INJECTION
1 gram | INTRAMUSCULAR | Status: AC
Start: 2021-03-24 — End: 2021-03-24
  Administered 2021-03-24: 04:00:00 via INTRAMUSCULAR

## 2021-03-24 MED ORDER — HYDROCHLOROTHIAZIDE 25 MG TAB
25 mg | Freq: Every day | ORAL | Status: DC
Start: 2021-03-24 — End: 2021-03-24
  Administered 2021-03-24: 15:00:00 via ORAL

## 2021-03-24 MED ORDER — ACETAMINOPHEN 325 MG TABLET
325 mg | ORAL | Status: DC | PRN
Start: 2021-03-24 — End: 2021-03-29
  Administered 2021-03-25: 19:00:00 via ORAL

## 2021-03-24 MED ORDER — TRAZODONE 100 MG TAB
100 mg | Freq: Every evening | ORAL | Status: DC
Start: 2021-03-24 — End: 2021-03-29
  Administered 2021-03-25 – 2021-03-29 (×5): via ORAL

## 2021-03-24 MED ORDER — PRAVASTATIN 40 MG TAB
40 mg | Freq: Every evening | ORAL | Status: DC
Start: 2021-03-24 — End: 2021-03-29
  Administered 2021-03-25 – 2021-03-29 (×5): via ORAL

## 2021-03-24 MED ORDER — TOPIRAMATE 25 MG TAB
25 mg | Freq: Every evening | ORAL | Status: DC
Start: 2021-03-24 — End: 2021-03-25
  Administered 2021-03-25 – 2021-03-26 (×2): via ORAL

## 2021-03-24 MED ORDER — TRAZODONE 50 MG TAB
50 mg | Freq: Every evening | ORAL | Status: DC | PRN
Start: 2021-03-24 — End: 2021-03-25

## 2021-03-24 MED ORDER — POTASSIUM CHLORIDE SR 20 MEQ TAB, PARTICLES/CRYSTALS
20 mEq | ORAL | Status: AC
Start: 2021-03-24 — End: 2021-03-24
  Administered 2021-03-24: 04:00:00 via ORAL

## 2021-03-24 MED ORDER — ASPIRIN 81 MG CHEWABLE TAB
81 mg | Freq: Every day | ORAL | Status: DC
Start: 2021-03-24 — End: 2021-03-29
  Administered 2021-03-24 – 2021-03-29 (×6): via ORAL

## 2021-03-24 MED ORDER — LORAZEPAM 2 MG/ML IJ SOLN
2 mg/mL | INTRAMUSCULAR | Status: DC | PRN
Start: 2021-03-24 — End: 2021-03-29

## 2021-03-24 MED ORDER — MAGNESIUM HYDROXIDE 400 MG/5 ML ORAL SUSP
400 mg/5 mL | Freq: Every day | ORAL | Status: DC | PRN
Start: 2021-03-24 — End: 2021-03-29

## 2021-03-24 MED ORDER — METFORMIN 500 MG TAB
500 mg | Freq: Two times a day (BID) | ORAL | Status: DC
Start: 2021-03-24 — End: 2021-03-29
  Administered 2021-03-24 – 2021-03-29 (×12): via ORAL

## 2021-03-24 MED ORDER — HALOPERIDOL LACTATE 5 MG/ML IJ SOLN
5 mg/mL | Freq: Four times a day (QID) | INTRAMUSCULAR | Status: DC | PRN
Start: 2021-03-24 — End: 2021-03-29

## 2021-03-24 MED ORDER — ARIPIPRAZOLE 5 MG TAB
5 mg | Freq: Every evening | ORAL | Status: DC
Start: 2021-03-24 — End: 2021-03-29
  Administered 2021-03-25 – 2021-03-29 (×5): via ORAL

## 2021-03-24 MED ORDER — ESTRADIOL 1 MG TAB
1 mg | Freq: Every day | ORAL | Status: DC
Start: 2021-03-24 — End: 2021-03-29
  Administered 2021-03-24 – 2021-03-29 (×6): via ORAL

## 2021-03-24 MED FILL — ASPIRIN 81 MG CHEWABLE TAB: 81 mg | ORAL | Qty: 1

## 2021-03-24 MED FILL — METFORMIN 500 MG TAB: 500 mg | ORAL | Qty: 1

## 2021-03-24 MED FILL — LISINOPRIL 10 MG TAB: 10 mg | ORAL | Qty: 1

## 2021-03-24 MED FILL — CEFTRIAXONE 1 GRAM SOLUTION FOR INJECTION: 1 gram | INTRAMUSCULAR | Qty: 1

## 2021-03-24 MED FILL — ESTRADIOL 1 MG TAB: 1 mg | ORAL | Qty: 2

## 2021-03-24 MED FILL — HYDROCHLOROTHIAZIDE 25 MG TAB: 25 mg | ORAL | Qty: 1

## 2021-03-24 MED FILL — BUSPIRONE 5 MG TAB: 5 mg | ORAL | Qty: 1

## 2021-03-24 MED FILL — LORAZEPAM 1 MG TAB: 1 mg | ORAL | Qty: 1

## 2021-03-24 MED FILL — POTASSIUM CHLORIDE SR 20 MEQ TAB, PARTICLES/CRYSTALS: 20 mEq | ORAL | Qty: 1

## 2021-03-24 NOTE — Consults (Signed)
Chief Complaints:     Chief Complaint   Patient presents with   ??? Anxiety   ??? Chest Pain   ??? Shortness of Breath         Subjective:     Mallory Bonilla is a 55 y.o. female followed by Sharlette Dense, NP and  has a past medical history of Anxiety (01/11/2021), Arthritis, Bipolar 1 disorder ([place]) (01/11/2021), Diabetes ([place]), Diabetes mellitus type 2, controlled ([place]) (01/11/2021), Headache, Headache (01/11/2021), Hypercholesterolemia, Hyperlipidemia (01/11/2021), Hypertension, and Hypertension (01/11/2021).  Presents to the emergency room with complaint of increased anxiety and eating her benzodiazepine restarted.  Patient reportedly was increased on her BuSpar dosing by Garey Ham but she feels like is not helping.  It is noted that when you are was not able to give any benzodiazepines she did report some suicidal thoughts secondary to anxiety her anxiety.  Patient did receive an IV Ativan dose and was then referred to the behavioral health unit for further evaluation and treatment      Past Medical History:   Diagnosis Date   ??? Anxiety 01/11/2021   ??? Arthritis    ??? Bipolar 1 disorder ([place]) 01/11/2021   ??? Diabetes ([place])    ??? Diabetes mellitus type 2, controlled ([place]) 01/11/2021   ??? Headache    ??? Headache 01/11/2021   ??? Hypercholesterolemia    ??? Hyperlipidemia 01/11/2021   ??? Hypertension    ??? Hypertension 01/11/2021      Past Surgical History:   Procedure Laterality Date   ??? HX OOPHORECTOMY       Family History   Problem Relation Age of Onset   ??? Diabetes Mother    ??? Cancer Mother    ??? Diabetes Sister    ??? Heart Disease Sister    ??? Cancer Sister    ??? Diabetes Brother    ??? Heart Disease Brother       Social History     Tobacco Use   ??? Smoking status: Never Smoker   ??? Smokeless tobacco: Never Used   Substance Use Topics   ??? Alcohol use: Not Currently       Prior to Admission medications    Medication Sig Start Date End Date Taking? Authorizing Provider   Emgality Pen 120 mg/mL injection  03/23/21   Provider, Historical    busPIRone (BUSPAR) 15 mg tablet Take 1 Tablet by mouth three (3) times daily for 90 days. Indications: repeated episodes of anxiety 03/22/21 06/20/21  Volney Presser, NP   traZODone (DESYREL) 100 mg tablet Take 1 Tablet by mouth nightly as needed for Sleep for up to 90 days. Indications: insomnia associated with depression 03/08/21 06/06/21  Volney Presser, NP   ARIPiprazole (ABILIFY) 5 mg tablet Take 1 Tablet by mouth nightly for 90 days. Indications: additional treatment for major depressive disorder 02/23/21 05/24/21  Thamas Jaegers A, NP   topiramate (Topamax) 50 mg tablet Take 1 Tablet by mouth nightly. Indications: migraine prevention 02/23/21   Thamas Jaegers A, NP   hydroCHLOROthiazide (HYDRODIURIL) 25 mg tablet Take 1 Tablet by mouth daily. Indications: high blood pressure 02/23/21   Epstein, Steffanie A, NP   pravastatin (PRAVACHOL) 40 mg tablet Take 40 mg by mouth nightly.    Other, Phys, MD   canagliflozin-metFORMIN (Invokamet) 150-500 mg tab Take  by mouth two (2) times a day.    Other, Phys, MD   estradioL (ESTRACE) 2 mg tablet Take  by mouth daily.    Other, Phys, MD  aspirin delayed-release 81 mg tablet Take 81 mg by mouth daily.    Other, Phys, MD     No Known Allergies     Review of Systems:  Review of Systems   Constitutional: Negative.    HENT: Negative.    Eyes: Negative.    Cardiovascular: Negative.    Gastrointestinal: Negative.    Endocrine: Negative.    Genitourinary: Negative.    Musculoskeletal: Negative.    Skin: Negative.    Allergic/Immunologic: Negative.    Neurological: Negative.    Hematological: Negative.    Psychiatric/Behavioral: [name].           Objective:     Vitals:  Visit Vitals  BP (!) 151/79 Comment: Pt was experiencing anxiety. LPN aware.    Pulse 95   Temp 97.8 ??F (36.6 ??C)   Resp 16   Ht '5\' 5"'  (1.651 m)   Wt 83 kg (183 lb)   SpO2 98%   Breastfeeding No   BMI 30.45 kg/m??       Physical Exam:  General: Alert, cooperative, no  distress.  Head:  Normocephalic, without obvious abnormality, atraumatic.  Eyes:  Conjunctivae/corneas clear. Pupils equal, round, reactive to light. Extraocular movements intact.  Lungs:  Clear to auscultation bilaterally, no wheezes, crackles  Chest wall: No tenderness or deformity.  Heart:  Regular rate and rhythm, S1, S2 normal, no murmur, click, rub, or gallop.  Abdomen:   Soft, non-tender. Bowel sounds normal. No masses. No organomegaly.  Back:  No spine tenderness to palpation  Extremities: Extremities normal, atraumatic, no cyanosis or edema.  Pulses: Symmetric all extremities.  Skin: Skin color, texture, turgor normal.   Lymph nodes: Cervical nodes normal.  Neurologic: CNII-XII intact. Normal strength, sensation, and reflexes throughout.      Labs:  Recent Results (from the past 24 hour(s))   EKG, 12 LEAD, INITIAL    Collection Time: 03/23/21 10:03 PM   Result Value Ref Range    Ventricular Rate 85 BPM    Atrial Rate 85 BPM    P-R Interval 140 ms    QRS Duration 104 ms    Q-T Interval 391 ms    QTC Calculation (Bezet) 465 ms    Calculated P Axis 37 degrees    Calculated R Axis 32 degrees    Calculated T Axis 53 degrees    Diagnosis Sinus rhythm  RSR' in V1 or V2, right VCD or RVH      CBC WITH AUTOMATED DIFF    Collection Time: 03/23/21 10:50 PM   Result Value Ref Range    WBC 7.3 4.4 - 11.3 K/uL    RBC 5.43 4.50 - 5.90 M/uL    HGB 13.9 13.5 - 17.5 g/dL    HCT 42.9 36 - 46 %    MCV 79.0 (L) 80 - 100 FL    MCH 25.6 (L) 31 - 34 PG    MCHC 32.4 31.0 - 36.0 g/dL    RDW 14.0 11.5 - 14.5 %    PLATELET 163 150 - 400 K/uL    MPV 8.9 6.5 - 11.5 FL    NRBC 0.2 PER 100 WBC    ABSOLUTE NRBC 0.01 K/uL    NEUTROPHILS 53 42 - 75 %    LYMPHOCYTES 37 20.5 - 51.1 %    MONOCYTES 6 1.7 - 9.3 %    EOSINOPHILS 3 (H) 0.9 - 2.9 %    BASOPHILS 1 0.0 - 2.5 %  ABS. NEUTROPHILS 3.9 1.8 - 7.7 K/UL    ABS. LYMPHOCYTES 2.7 1.0 - 4.8 K/UL    ABS. MONOCYTES 0.5 0.2 - 2.4 K/UL    ABS. EOSINOPHILS 0.2 0.0 - 0.7 K/UL    ABS. BASOPHILS 0.1  0.0 - 0.2 K/UL   METABOLIC PANEL, COMPREHENSIVE    Collection Time: 03/23/21 10:50 PM   Result Value Ref Range    Sodium 139 136 - 145 mmol/L    Potassium 3.3 (L) 3.5 - 5.1 mmol/L    Chloride 104 97 - 108 mmol/L    CO2 26 21 - 32 mmol/L    Anion gap 9 5 - 15 mmol/L    Glucose 128 (H) 65 - 100 mg/dL    BUN 14 6 - 20 mg/dL    Creatinine 0.77 0.55 - 1.02 mg/dL    BUN/Creatinine ratio 18 12 - 20      GFR est AA >60 >60 ml/min/1.34m    GFR est non-AA >60 >60 ml/min/1.726m   Calcium 8.9 8.5 - 10.1 mg/dL    Bilirubin, total 0.3 0.2 - 1.0 mg/dL    AST (SGOT) 31 15 - 37 U/L    ALT (SGPT) 41 12 - 78 U/L    Alk. phosphatase 94 45 - 117 U/L    Protein, total 6.9 6.4 - 8.2 g/dL    Albumin 3.5 3.5 - 5.0 g/dL    Globulin 3.4 2.0 - 4.0 g/dL    A-G Ratio 1.0 (L) 1.1 - 2.2     ACETAMINOPHEN    Collection Time: 03/23/21 10:50 PM   Result Value Ref Range    Acetaminophen level <10 (L) 10 - 30 ug/mL    Reported dose date Not provided      Reported dose: Not provided Units   SALICYLATE    Collection Time: 03/23/21 10:50 PM   Result Value Ref Range    Salicylate level <1<4.7L) 2.8 - 20.0 mg/dL    Reported dose date Not provided      Reported dose: Not provided Units   ETHYL ALCOHOL    Collection Time: 03/23/21 10:50 PM   Result Value Ref Range    ALCOHOL(ETHYL),SERUM <4 <10 mg/dL   COVID-19 WITH INFLUENZA A/B    Collection Time: 03/23/21 10:50 PM   Result Value Ref Range    SARS-CoV-2 by PCR Not Detected Not Detected      Influenza A by PCR Not Detected Not Detected      Influenza B by PCR Not Detected Not Detected     DRUG SCREEN, URINE    Collection Time: 03/23/21 11:00 PM   Result Value Ref Range    AMPHETAMINES Negative Negative      BARBITURATES Negative Negative      BENZODIAZEPINES Positive (A) Negative      COCAINE Negative Negative      ECSTASY, MDMA Negative Negative      METHADONE Negative Negative      OPIATES Negative Negative      PCP(PHENCYCLIDINE) Negative Negative      THC (TH-CANNABINOL) Negative Negative      Drug  screen comment PH=6.0    URINALYSIS W/ RFLX MICROSCOPIC    Collection Time: 03/23/21 11:00 PM   Result Value Ref Range    Color Yellow/Straw      Appearance Clear Clear      Specific gravity >1.030 (H) 1.003 - 1.030    Specific gravity >1.030     pH (UA) 6.0 5.0 - 8.0  Protein Negative Negative mg/dL    Glucose 250 (A) Negative mg/dL    Ketone Negative Negative mg/dL    Bilirubin Negative Negative      Blood Negative Negative      Urobilinogen 0.2 0.2 - 1.0 EU/dL    Nitrites Negative Negative      Leukocyte Esterase Small (A) Negative     URINE MICROSCOPIC    Collection Time: 03/23/21 11:00 PM   Result Value Ref Range    WBC 20-50 0 - 5 /hpf    RBC 5-10 0 - 3 /hpf    Bacteria 2+ (A) Negative /hpf       Imaging:  No results found.     Assessment & Plan:     Anxiety  - follows with Angella Blaine and recently increased on her home buspar but patient does not think it helps and wishes for benzodiazepines   - management as per primary psychiatric team    Hypertension  -Elevated blood pressure since admission noted to be on hydrochlorothiazide which was given this morning but continued to have elevated blood pressure but patient notes being on previously lisinopril and metoprolol but none noted per [facility] reconciliation  -We will start patient on lisinopril 10 mg first dose now monitor blood pressure but also renal protective in the setting of diabetes    Diabetes  -Monitor Accu-Cheks daily  Patient does not wish to have sliding scale coverage  -Restart patient's home oral and subcutaneous injections    Hyperlipidemia  -Restarted home Pravachol    Migraines  -Takes Topamax nightly as well as muscle injections and has improved overall her episodes of migraines    Thank you for allowing [place] to help you care for this patient please call with any questions or concerns    Spent 30 minutes evaluating cording patient's consultation of behavioral health unit patient requested by Royal Piedra, PA for medical and neurological  evaluation      Electronically signed by Domingo Dimes, MD on 03/24/2021 at 3:12 PM

## 2021-03-24 NOTE — Behavioral Health Treatment Team (Signed)
Pt is out and about on unit, states she is doing okay. Denies suicidal and homicidal thoughts, admits to anxiety and some depression. States she is eating and sleeping well, compliant with meds, and attending groups. Encouraged to work on coping skills to help with anxiety and depression. Safe on unit.  I: Maintain a safe environment for pt, safety cks q 15 mins and prn. Give meds as ordered, and encourage groups.  R: Pt is cooperative with assessment and says she is doing okay. States she continues to feel anxious. Encouraged to work on coping skills to help with anxiety. Denies suicidal and homicidal thoughts. Safe on unit.  P: Continue to monitor, give meds as ordered, encourage groups.

## 2021-03-24 NOTE — Behavioral Health Treatment Team (Signed)
Pt.s medications have all been administered per order.  Pt. States her Buspar was just increased on Thursday to 15mg  TID from 10mg  TID, and she never got to try it yet.  Pt. Talking with peer, laughing, interacting, calm.  Afternoon BP check was elevated due to pt. Anxiety, as she states, " I just need my Buspar and my Klonopin, I don't know why she took my Klonopin away".  Since medications have all been administered, will recheck BP accordingly.

## 2021-03-24 NOTE — Progress Notes (Signed)
Pt admitted this AM, Care Plan started.

## 2021-03-24 NOTE — Progress Notes (Signed)
 56 year old female admitted from ER to [facility] services Sydnee Elder PA) covering, with a diagnosis of Major Depression. Pt comes to ER with complaint of panic attacks and anxiety then tells staff that she is afraid to go home due to suicidal thoughts. Pt states that she has been taken off her Klonopin due to a serious overdose in Feb. Presents to unit cooperative with anxious depressed mood, states she has been inpatient so many times she has lost count. States she does not feel she will harm herself here and feels safe on the unit. States that she gets worked up and screams and cries during panic attacks. States she cannot breath and it is very frightening to her. Denies use of illegal drugs or alcohol and does not smoke. States she is compliant with meds and is not allergic to any meds. Pt denies hallucinations and paranoia, says she sleep about 4 hours at night.  Body and belongings checked with no contraband found, admission paperwork completed. Tour of unit given and pt taken to her room and made comfortable. Safe on unit.

## 2021-03-24 NOTE — Behavioral Health Treatment Team (Signed)
B:  Pt. Alert and oriented x 4.  Pt. States depression is a 1.  Pt. States anxiety is 4.  Pt. Denies hallucinations.  Denies SI/HI.  Cooperative with assessment. Pt. States she checks her blood sugars once a day at home in t he am.  Her trulicity shot she takes at home, is once a week on mondays, our pharmacy does not carry, she is going to have family bring it for her.  She also takes Ivokamet for Blood sugars, has been out 3 days at home with no refills yet, our pharmacy does not have all components, Hospitalist ordering accordingly.  Pt. States even without the exact right meds, she does not want a sliding scale insulin while here. Hospitalist made aware.  Pt.. states she always has panic attacks, and she had a bad one last night where she felt like she couldn't breathe.  Pt.. states she slept better last night, than she has slept In weeks, and feeling  Pretty calm so far this morning.   I:   Administer medications as ordered and needed, Encourage pt to attend and participate in groups, encourage pt to be up for all meals and snacks, consuming all each time, encourage pt to interact with peers in a positive manner. Q 15 minute safety checks continue.  R:   Compliant with medications.  does attend groups, does participate.  Pt. is getting up for meals, consumes all of meals, snacks. Pt. does interact with peers. no safety concerns at this time.  P:   Pt. Will develop and continue to utilize positive coping skills, versus relying on medication only for stabilization.
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
## 2021-03-24 NOTE — Progress Notes (Signed)
Pt is progressing toward her goals. Denies suicidal and homicidal thoughts. States she continues to have anxiety with some depression. Eating and sleeping well, compliant with meds. Attending groups, encouraged to work on Pharmacologist. Safe on unit.

## 2021-03-24 NOTE — Behavioral Health Treatment Team (Signed)
Pt has rested well since admission with no complaints voiced and no problems noted on rounds. Safe on unit.

## 2021-03-25 ENCOUNTER — Inpatient Hospital Stay

## 2021-03-25 LAB — LIPID PANEL
CHOL/HDL Ratio: 3.7 (ref 0.0–5.0)
Chol/HDL Ratio: 3.7 (ref 0.0–5.0)
Cholesterol, Total: 168 mg/dL (ref <200)
Cholesterol, total: 168 mg/dL (ref <200)
HDL Cholesterol: 45 mg/dL
HDL: 45 mg/dL
LDL Calculated: 91.8 mg/dL (ref 0–100)
LDL, calculated: 91.8 mg/dL (ref 0–100)
Triglyceride: 156 mg/dL — ABNORMAL HIGH (ref <150)
Triglycerides: 156 mg/dL — ABNORMAL HIGH (ref <150)
VLDL Cholesterol Calculated: 31.2 mg/dL
VLDL, calculated: 31.2 mg/dL

## 2021-03-25 LAB — POCT GLUCOSE: POC Glucose: 114 mg/dL (ref 65–117)

## 2021-03-25 LAB — GLUCOSE, POC: Glucose (POC): 114 mg/dL (ref 65–117)

## 2021-03-25 MED ORDER — DULAGLUTIDE 0.75 MG/0.5 ML SUBCUTANEOUS PEN INJECTOR
0.75 mg/0.5 mL | SUBCUTANEOUS | Status: DC
Start: 2021-03-25 — End: 2021-03-29
  Administered 2021-03-26: 13:00:00 via SUBCUTANEOUS

## 2021-03-25 MED ORDER — LISINOPRIL 5 MG TAB
5 mg | Freq: Every day | ORAL | Status: DC
Start: 2021-03-25 — End: 2021-03-29
  Administered 2021-03-25 – 2021-03-29 (×5): via ORAL

## 2021-03-25 MED FILL — TRAZODONE 100 MG TAB: 100 mg | ORAL | Qty: 1

## 2021-03-25 MED FILL — TRULICITY 0.75 MG/0.5 ML SUBCUTANEOUS PEN INJECTOR: 0.75 mg/0.5 mL | SUBCUTANEOUS | Qty: 0.5

## 2021-03-25 MED FILL — PRAVASTATIN 40 MG TAB: 40 mg | ORAL | Qty: 1

## 2021-03-25 MED FILL — ACETAMINOPHEN 325 MG TABLET: 325 mg | ORAL | Qty: 2

## 2021-03-25 MED FILL — METFORMIN 500 MG TAB: 500 mg | ORAL | Qty: 1

## 2021-03-25 MED FILL — BUSPIRONE 5 MG TAB: 5 mg | ORAL | Qty: 1

## 2021-03-25 MED FILL — ARIPIPRAZOLE 5 MG TAB: 5 mg | ORAL | Qty: 1

## 2021-03-25 MED FILL — TOPIRAMATE 25 MG TAB: 25 mg | ORAL | Qty: 2

## 2021-03-25 MED FILL — LISINOPRIL 10 MG TAB: 10 mg | ORAL | Qty: 1

## 2021-03-25 MED FILL — ASPIRIN 81 MG CHEWABLE TAB: 81 mg | ORAL | Qty: 1

## 2021-03-25 MED FILL — ESTRADIOL 1 MG TAB: 1 mg | ORAL | Qty: 2

## 2021-03-25 NOTE — Behavioral Health Treatment Team (Signed)
 B: Patient is alert and oriented x 3. Patient up for wrap up session. Completed worksheet. Rates her depression, rates her anxiety at 8. Patient seen by Lynwood Brain, PA. Patient expressed that she was anxious all the time day and night. Requesting medications for anxiety and asked to increase her Trazadone to 100 mg instead of the 50 mg that she is currently taken. Orders received orders for Topamax  to be increased from 50 mg to 100 mg PO QHS. Patient will receive Abilify  2 mg PO q am and 5 mg PO QPM. Patient stated that she was having problems swallowing. States I am having problems walling my own saliva. Orders received and carried. Patient questioning when she will be discharged home. Patient requesting to be discharged by Tuesday so that she can go to her regular scheduled therapy appointment. Will revaluate for discharge after swallow evaluation is done. Will continue to monitor every 15 minutes for safety.  I: Provide medications as ordered. Continue every 15 minute safety checks. Schedule Speech therapy evaluation due to patient complaints of not being able to swallow foods or medications.   R: Patient is compliant with her medications. Patient up and attends groups and activities. Patient interacts well with peers and staff. Will continue every 15 minute safety checks.  P: Schedule speech therapy consult. Patient will state 3 coping skills she can use to help her when she feels a panic attack coming on.    0600 Patient slept all night. Will continue every 15 minute safety checks.

## 2021-03-25 NOTE — Behavioral Health Treatment Team (Signed)
 B:  Pt. Alert and oriented x 4.  Pt. States depression is a 1.  Pt. States anxiety is 8.  Pt. Denies hallucinations.  Denies SI/HI.  Cooperative with assessment. Pt. Smiling this am, pleasant, good eye contact. States she slept pretty well, states fair for overall progress.   I:   Administer medications as ordered and needed, Encourage pt to attend and participate in groups, encourage pt to be up for all meals and snacks, consuming all each time, encourage pt to interact with peers in a positive manner. Q 15 minute safety checks continue.  R:   Compliant with medications.  does attend groups, does participate.  Pt. is getting up for meals, consumes all of meals, snacks. Pt. does interact with peers. no safety concerns at this time.  P:   Pt. Will develop and continue to utilize positive coping skills.

## 2021-03-25 NOTE — Progress Notes (Signed)
Psychiatric Progress Note    Patient: Mallory Bonilla MRN: [id number]  SSN: [id number]    Date of Birth: 08-01-1966  Age: 55 y.o.  Sex: female      Admit Date: 03/23/2021       Subjective:     Mallory Bonilla  reports feeling anxious and just wants the medications to start helping. She asked if she would be getting Klonopin or something similar at discharge.    I discussed at length the non-medication options besides the BZ    Denies SI/HI/AH/VH.  No aggression or violence.  Appropriately interactive and aware.     Tolerating medications well.      Eating well and sleeping well.    Case reviewed with nursing staff and no significant issues or events reported in the last 24 hours.    Staff has observed patient interacting on the unit and attending group.    Objective:     Vitals reviewed and stable        Mental Status Exam:     Patient is alert, oriented x4  Speech is coherent, moderate volume, no flight of ideas, no looseness of association.  Appropriate dressed and groomed  No Active suicidal ideations, plan or intent.  No active homicidal ideations plan or intent  No command hallucinations  Thought Processes linear  No persecutory delusions  Not seen responding to any active internal stimuli  Insight fair  Judgement fair    No signs of tardive dyskinesia or extrapyramidal symptoms    MEDICATIONS:  Current Facility-Administered Medications   Medication Dose Route Frequency   ??? calcium carbonate (TUMS) chewable tablet 200 mg [elemental]  200 mg Oral TID WITH MEALS   ??? dulaglutide (TRULICITY) 0.75 mg/0.5 mL sub-q pen 0.75 mg (Patient Supplied)  0.75 mg SubCUTAneous Q7D   ??? lisinopriL (PRINIVIL, ZESTRIL) tablet 5 mg  5 mg Oral DAILY   ??? topiramate (TOPAMAX) tablet 100 mg  100 mg Oral QHS   ??? ARIPiprazole (ABILIFY) tablet 2 mg  2 mg Oral DAILY   ??? haloperidol lactate (HALDOL) injection 5 mg  5 mg IntraMUSCular Q6H PRN   ??? diphenhydrAMINE (BENADRYL) injection 50 mg  50 mg IntraMUSCular BID PRN   ??? hydrOXYzine HCL  (ATARAX) tablet 50 mg  50 mg Oral TID PRN   ??? LORazepam (ATIVAN) injection 1 mg  1 mg IntraMUSCular Q4H PRN   ??? acetaminophen (TYLENOL) tablet 650 mg  650 mg Oral Q4H PRN   ??? magnesium hydroxide (MILK OF MAGNESIA) 400 mg/5 mL oral suspension 30 mL  30 mL Oral DAILY PRN   ??? metFORMIN (GLUCOPHAGE) tablet 500 mg  500 mg Oral BID WITH MEALS   ??? pravastatin (PRAVACHOL) tablet 40 mg  40 mg Oral QHS   ??? estradioL (ESTRACE) tablet 2 mg  2 mg Oral DAILY   ??? aspirin chewable tablet 81 mg  81 mg Oral DAILY   ??? ARIPiprazole (ABILIFY) tablet 5 mg  5 mg Oral QHS   ??? traZODone (DESYREL) tablet 100 mg  100 mg Oral QHS   ??? busPIRone (BUSPAR) tablet 15 mg  15 mg Oral TID        DISCUSSION:  Discussed risk and benefits of medication, provided an opportunity to answer any questions, reviewed discharge goals.    Lab/Data Review:  Recent Results (from the past 24 hour(s))   GLUCOSE, POC    Collection Time: 03/27/21  5:47 AM   Result Value Ref Range    Glucose (POC) 95 65 -  117 mg/dL    Performed by Magnus Sinning            Assessment:     Active Problems:    MDD (major depressive disorder) (03/24/2021)        Plan:     Continue current plan of care excepted as noted.    Increase Topamax and Abilify     Continue to participate in the milieu of activities and work towards discharge goals.    Contracts for safety and has no immediate requests    Disposition planning with social work with the goal of a transition to an outpatient level of care.    Patient would benefit from ongoing care as they have not yet reached their discharge goals are psychotropic medication optimization.    Tentative discharge TBD     Signed By: Renato Shin, PhD, PA-C, PsyCAQ  [phone]    March 27, 2021

## 2021-03-25 NOTE — Behavioral Health Treatment Team (Signed)
Pt. Up most of day interacting with peers participating.  No complaints of anxiety this afternoon, Denies SI/HI. q 15 minute safety checks continue.

## 2021-03-25 NOTE — Behavioral Health Treatment Team (Signed)
Pt slept well tonight with no complaints voiced and no problems noted on rounds. Safe on unit will continue to monitor.

## 2021-03-26 LAB — EKG 12-LEAD
Atrial Rate: 85 {beats}/min
P Axis: 37 degrees
P-R Interval: 140 ms
Q-T Interval: 391 ms
QRS Duration: 104 ms
QTc Calculation (Bazett): 465 ms
R Axis: 32 degrees
T Axis: 53 degrees
Ventricular Rate: 85 {beats}/min

## 2021-03-26 LAB — POCT GLUCOSE: POC Glucose: 110 mg/dL (ref 65–117)

## 2021-03-26 LAB — GLUCOSE, POC: Glucose (POC): 110 mg/dL (ref 65–117)

## 2021-03-26 LAB — EKG, 12 LEAD, INITIAL
Atrial Rate: 85 {beats}/min
Calculated P Axis: 37 degrees
Calculated R Axis: 32 degrees
Calculated T Axis: 53 degrees
P-R Interval: 140 ms
Q-T Interval: 391 ms
QRS Duration: 104 ms
QTC Calculation (Bezet): 465 ms
Ventricular Rate: 85 {beats}/min

## 2021-03-26 MED ORDER — TOPIRAMATE 25 MG TAB
25 mg | Freq: Every evening | ORAL | Status: DC
Start: 2021-03-26 — End: 2021-03-29
  Administered 2021-03-27 – 2021-03-29 (×3): via ORAL

## 2021-03-26 MED ORDER — ARIPIPRAZOLE 2 MG TAB
2 mg | Freq: Every day | ORAL | Status: DC
Start: 2021-03-26 — End: 2021-03-29
  Administered 2021-03-26 – 2021-03-29 (×4): via ORAL

## 2021-03-26 MED FILL — LISINOPRIL 5 MG TAB: 5 mg | ORAL | Qty: 1

## 2021-03-26 MED FILL — BUSPIRONE 5 MG TAB: 5 mg | ORAL | Qty: 1

## 2021-03-26 MED FILL — ARIPIPRAZOLE 2 MG TAB: 2 mg | ORAL | Qty: 1

## 2021-03-26 MED FILL — METFORMIN 500 MG TAB: 500 mg | ORAL | Qty: 1

## 2021-03-26 MED FILL — ARIPIPRAZOLE 5 MG TAB: 5 mg | ORAL | Qty: 1

## 2021-03-26 MED FILL — TOPIRAMATE 25 MG TAB: 25 mg | ORAL | Qty: 2

## 2021-03-26 MED FILL — ESTRADIOL 1 MG TAB: 1 mg | ORAL | Qty: 2

## 2021-03-26 MED FILL — PRAVASTATIN 40 MG TAB: 40 mg | ORAL | Qty: 1

## 2021-03-26 MED FILL — TRAZODONE 100 MG TAB: 100 mg | ORAL | Qty: 1

## 2021-03-26 MED FILL — ASPIRIN 81 MG CHEWABLE TAB: 81 mg | ORAL | Qty: 1

## 2021-03-26 NOTE — Behavioral Health Treatment Team (Signed)
Pt has been visible on unit throughout the day.  Pt is calm, cooperative, social and pleasant.  Pt is attending groups and interacting appropriately with peers.  No s/s of distress noted.  No complaints of pain.  During Pt assessment with the psychiatrist earlier this afternoon, Pt had requested additional anxiety medications.  Dr Donalynn Furlong re-ordered PRN Atarax for Pt.  Then this afternoon at 1440, Pt requested her PRN atarax. Pt was informed her scheduled Buspar was due, and asked if she wanted to wait on the atarax.  Pt verbalized understanding that spacing the Buspar and Atarax apart provides better coverage.  Pt chose not to receive her atarax at that time and has not asked for it since.  Pt has spent most of the afternoon in the dayroom socializing w. Peer.

## 2021-03-26 NOTE — Behavioral Health Treatment Team (Signed)
The patient reviewed and signed her treatment plan.

## 2021-03-26 NOTE — Behavioral Health Treatment Team (Signed)
PSA PART II ADDITIONAL INFORMATION        Access To Fire Arms: No    Substance Use: NO    Last Use:     Type of Substance: no substance use    Frequency of Use:     Request to See Chaplain: YES    If yes, notified Chaplain: YES    GUARDIAN/POA: NO    Guardian Name:     Guardian Contact:     Release of Information Signed: YES    Release of Information Signed For:   Gaye Pollack (girlfriend) [phone]   Meta Hatchet N.P. : [phone]
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
## 2021-03-26 NOTE — Behavioral Health Treatment Team (Signed)
Pt. Alert and oriented x 3. Physical Assessment was done et wnl. No c/o pain voiced. Pt. Calm et cooperative.   Pt. States depression is a 0/10.  Pt. States anxiety is 9/10.   Pt. Denies having  hallucinations. Pt. Denies SI/HI.        I: Administer medications as ordered and needed, Encourage pt to attend and participate in groups, encourage pt to be up for all meals and snacks, consuming all each time, encourage pt to interact with peers in a positive manner. Q 15 minute safety checks continue.  R: Pt. Is compliant  with medications.  does attend groups, does participate.  Pt. Is  getting up for meals, consumes 100% of meals, snacks. Pt. does interact with peers. no safety concerns at this time.  P:Will cont. To monitor q33min. Checks for safety. Administer medications as ordered et as needed. Will cont. To encourage group attendance et participation.

## 2021-03-27 LAB — POCT GLUCOSE: POC Glucose: 95 mg/dL (ref 65–117)

## 2021-03-27 LAB — GLUCOSE, POC: Glucose (POC): 95 mg/dL (ref 65–117)

## 2021-03-27 MED ORDER — CALCIUM CARBONATE 200 MG (500 MG) CHEWABLE TAB
200 mg calcium (500 mg) | Freq: Three times a day (TID) | ORAL | Status: DC
Start: 2021-03-27 — End: 2021-03-29
  Administered 2021-03-27 – 2021-03-29 (×9): via ORAL

## 2021-03-27 MED ORDER — DIAZEPAM 2 MG TAB
2 mg | Freq: Every day | ORAL | Status: DC
Start: 2021-03-27 — End: 2021-03-29
  Administered 2021-03-27 – 2021-03-29 (×4): via ORAL

## 2021-03-27 MED FILL — CALCIUM CARBONATE 200 MG (500 MG) CHEWABLE TAB: 200 mg calcium (500 mg) | ORAL | Qty: 1

## 2021-03-27 MED FILL — TRAZODONE 100 MG TAB: 100 mg | ORAL | Qty: 1

## 2021-03-27 MED FILL — ASPIRIN 81 MG CHEWABLE TAB: 81 mg | ORAL | Qty: 1

## 2021-03-27 MED FILL — BUSPIRONE 5 MG TAB: 5 mg | ORAL | Qty: 1

## 2021-03-27 MED FILL — TOPIRAMATE 25 MG TAB: 25 mg | ORAL | Qty: 4

## 2021-03-27 MED FILL — METFORMIN 500 MG TAB: 500 mg | ORAL | Qty: 1

## 2021-03-27 MED FILL — ARIPIPRAZOLE 2 MG TAB: 2 mg | ORAL | Qty: 1

## 2021-03-27 MED FILL — ARIPIPRAZOLE 5 MG TAB: 5 mg | ORAL | Qty: 1

## 2021-03-27 MED FILL — LISINOPRIL 5 MG TAB: 5 mg | ORAL | Qty: 1

## 2021-03-27 MED FILL — DIAZEPAM 2 MG TAB: 2 mg | ORAL | Qty: 1

## 2021-03-27 MED FILL — PRAVASTATIN 40 MG TAB: 40 mg | ORAL | Qty: 1

## 2021-03-27 MED FILL — ESTRADIOL 1 MG TAB: 1 mg | ORAL | Qty: 2

## 2021-03-27 MED FILL — HYDROXYZINE 50 MG TAB: 50 mg | ORAL | Qty: 1

## 2021-03-27 NOTE — Behavioral Health Treatment Team (Signed)
Pt. Has slept @ intervals tonight. No problems noted. Will cont. To monitor q50min. Checks for safety.

## 2021-03-27 NOTE — Behavioral Health Treatment Team (Signed)
B:  Pt. Alert and oriented x 4.  Pt. States depression is a 0.  Pt. States anxiety is 7.  Pt. Denies hallucinations.  Denies SI/HI.  Cooperative with assessment. Pt. Ate half of her breakfast, then stated to writer she had "indigestion".  Speech came per order and evaluated pt. For "swallowing" issues.  Her evaluation will be in  Chart. She verbally told writer she would order her " easy to chew and swallow foods" and speak  With Hospitalist regarding evaluation.  Immediately after Speech leaving, Pt. Came to Nurses station window asking for her snack,she is hungry and  Ready to  Eat.  Pt. Observed eating yogurt without coughing or complaints.    I:   Administer medications as ordered and needed, Encourage pt to attend and participate in groups, encourage pt to be up for all meals and snacks, consuming all each time, encourage pt to interact with peers in a positive manner. Q 15 minute safety checks continue.  R:   Compliant with medications.  does attend groups, does participate.  Pt. is getting up for meals, consumes all of meals, snacks. Pt. does interact with peers. no safety concerns at this time.  P:   Pt. Will develop and continue to utilize positive coping skills.

## 2021-03-27 NOTE — Behavioral Health Treatment Team (Signed)
Treatment team completed with patient today with Dr. Donalynn Furlong, Social Worker, and Clinical research associate.  Pt. Requesting "Xanax" stating it is the only thing that helps her anxiety.  It was discussed per team, about oupatient therapist trying to wean her off of her Benzodiazepines.  Pt. Denied, stating, she  Just wanted me to take a break and try the increase in Buspar first.  Team brought up she initially started her increase her first day here, and needs some time to regulate.  Dr. Sherron Monday with Pt.  Regarding different types of medications, that could benefit her.  Pt. In agreement, questioning when she can go home. Dr.  Donalynn Furlong stated, he will speak with her about that next in person visit.  Pt. Agreed.

## 2021-03-27 NOTE — Behavioral Health Treatment Team (Signed)
Pt. C/o being anxious. Atarax 50mg given p.o. for c/o anxiety.

## 2021-03-27 NOTE — Behavioral Health Treatment Team (Signed)
Pt is resting in bed reading a book, states she has been up today attending groups and working on coping skills to help with her anxiety. Denies suicidal and homicidal thoughts, says she has no depression. Continues to be anxious and says she had problems sleeping last night due to another patient being loud. Compliant with meds, eating well. Talked with pt about coping skills to help with anxiety and panic attacks. Safe on unit.  I: Maintain a safe environment for pt, safety cks q 15 mins and prn. Give meds as ordered, encourage groups.  R: Pt is pleasant and cooperative with assessment, states she has no suicidal and homicidal thoughts, and no depression. Says she is still anxious. Continues to work on Pharmacologist. Safe on unit.  P: Continue to monitor, give meds as ordered, encourage groups.

## 2021-03-27 NOTE — Behavioral Health Treatment Team (Signed)
 Behavioral Health Treatment Team Note     Patient goal(s) for today: Talk to the Dr. About my meds    Treatment team focus/goals: Continue medication and work on safe discharge planning      Progress note: The patient denied SI/HI/AVH at this time and was oriented x4, she described her mood as good and presented a sad mood with a flat affect. The writer asked where she would rate her anxiety and she reported 10 or of 10. The writer asked why and she responded saying just part of my makeup, not able to function. The writer validated her feelings and encouraged her again to be more proactive about her mental health ant feel so defeatist about progress she hasn't made. And reminded her about her goal of going to the beach in August and to work towards anxiety management so that she doesn't become so overwhelmed.       COLLATERAL NOTE    The writer spoke with the patient's partner Camera operator) who is aware of the reason for admission. She reported that she did not know that her partner had had a history of abusing benzos and that she had always seen her take them as scheduled. And that the patient had never endorsed SI thoughts or plans to her during the 4 years they have been together. The writer informed her that medications such as xanax can not be prescribed anymore for long term use and that the patient had asked for them, but was denied. But that we had started her on an alternative that will help with anxiety management that is less potent than what she was taking before. The partner sounded relived by this and verbalized that she is supportive of whatever will help her partner.            LOS:  3  Expected LOS: 5-7 Days     Insurance info/prescription coverage: Medicare   Date of last family contact:  03/26/2021  Family requesting physician contact today:  no  Discharge plan:  Medication management and mood stabilization   Guns in the home:  no   Outpatient provider(s): Dr. Deloria, N.P. Jon Berth, and  Aleck Frankel     Participating treatment team members: [address] [place]a [place], [place]

## 2021-03-27 NOTE — Progress Notes (Signed)
Pt is progressing toward her goals. Denies suicidal and homicidal thoughts, denies depression. States she continues to have anxiety and is working on Pharmacologist. Eating and sleeping well, compliant with meds. Attending and participating in groups. Safe on unit.

## 2021-03-27 NOTE — Progress Notes (Signed)
Spiritual Care Assessment/Progress Note  [facility]      NAME: Mallory Bonilla      MRN: [id number]  AGE: 55 y.o. SEX: female  Religious Affiliation: No preference   Language: English     03/27/2021     Total Time (in minutes): 40     Spiritual Assessment begun in [facility] through conversation with:         [x] Patient        []  Family    []  Friend(s)        Reason for Consult: Initial/Spiritual assessment, patient floor     Spiritual beliefs: (Please include comment if needed)     [x]  Identifies with a faith tradition: Christian/Non0Denominational         []  Supported by a faith community:            []  Claims no spiritual orientation:           []  Seeking spiritual identity:                []  Adheres to an individual form of spirituality:           []  Not able to assess:                           Identified resources for coping:      [x]  Prayer                               [x]  Music                  []  Guided Imagery     [x]  Family/friends                 []  Pet visits     [x]  Devotional reading                         []  Unknown     []  Other:                                               Interventions offered during this visit: (See comments for more details)    Patient Interventions: Affirmation of emotions/emotional suffering,Affirmation of faith,Catharsis/review of pertinent events in supportive environment,Coping skills reviewed/reinforced,Iconic (affirming the presence of God/Higher Power),Normalization of emotional/spiritual concerns,Prayer (actual),Religious beliefs/image of God discussed           Plan of Care:     [x]  Support spiritual and/or cultural needs    []  Support AMD and/or advance care planning process      []  Support grieving process   []  Coordinate Rites and/or Rituals    []  Coordination with community clergy   []  No spiritual needs identified at this time   []  Detailed Plan of Care below (See Comments)  []  Make referral to Music Therapy  []  Make  referral to Pet Therapy     []  Make referral to Addiction services  []  Make referral to [facility]  []  Make referral to Spiritual Care Partner  []  No future visits requested        [x]  Contact Spiritual Care for further referrals     Comments: Visited with patient while rounding  in the Behavioral Health Unit. Introduced myself and offered spiritual care and support. Patient shared that she is [name] and attends and Non-Denominational church and her partner attends a more traditional [employer]. She says this has been a point of tension and they are still trying to work that out. Patient has support from her partner, and her two children. She enjoys reading the Bible and her favorite scripture is Proverbs 3:5-6. She also utilizes [name], Multimedia programmer, and [employer]. She exercises, plays pickle ball, paint by numbers, and likes nature and walking outside. She also has pets.. Patient shared why she is here and described events that led to her being here. Chaplain provided emotional and spiritual support, presence, words of guidance, encouragement, and prayer. Advised of chaplain availability.  Advised nurse to contact Spiritual Care for any further referrals.  Rev. LaToya [place], MDiv, Chaplain

## 2021-03-27 NOTE — Behavioral Health Treatment Team (Signed)
Pt. Complaining of indigestion this am.  Hospitalist notified, New order received for TUMS PRN. Pt. In agreement.

## 2021-03-27 NOTE — Progress Notes (Signed)
SPEECH LANGUAGE PATHOLOGY BEDSIDE SWALLOW EVALUATION AND DISCHARGE    Patient: Mallory Bonilla (55 y.o. female)  Date: 03/27/2021  Primary Diagnosis: MDD (major depressive disorder) [F32.9]       Precautions: safety precautions         ASSESSMENT :  Clinical beside swallow eval completed per MD orders. Pt A&Ox4. Functional communication. Intelligibility >90%. Cognitive-linguistic function appears intact. OM examination revealed oral motor structures functional for mastication and deglutition. Patient cleared PO trials without any oral dysphagia present.  No coughing noted with PO intake; however, patient with delayed throat clear and delayed non-productive cough following conclusion of PO trials.  Patient states that she has had a cough since her COVID dx, but staff notes patient frequency of coughing has increased since switching to Lisinopril.  Patient also with c/o increased, thick, ropey secretions making it difficult for her to swallow.  She also endorses indigestion, painful burning in her throat, and dry heaving at night > day.       PLAN :  Recommendations and Planned Interventions:  -Rec easy to chew solids with thin liquids  -Rec GI consult for possible esophageal dysphagia/GERD management  -Rec outpatient MBSS if symptoms persist or worsen  -Eval only. SLP available for re-evaluation if indicated by MD.  Discharge Recommendations: None     SUBJECTIVE:   Patient stated "I just feel like I can't swallow".    OBJECTIVE:     Past Medical History:   Diagnosis Date   . Anxiety 01/11/2021   . Arthritis    . Bipolar 1 disorder (HCC) 01/11/2021   . Diabetes (HCC)    . Diabetes mellitus type 2, controlled (HCC) 01/11/2021   . Headache    . Headache 01/11/2021   . Hypercholesterolemia    . Hyperlipidemia 01/11/2021   . Hypertension    . Hypertension 01/11/2021     Past Surgical History:   Procedure Laterality Date   . HX OOPHORECTOMY       Home Situation:   Home Situation  Home Environment: Private residence  # Steps to  Enter: 4  One/Two Story Residence: One story  Living Alone: No  Support Systems: Spouse/Significant Other  Patient Expects to be Discharged to:: Home  Current DME Used/Available at Home: None    Diet prior to admission: Regular with thin liquids  Current Diet:  Regular solids with thin liquids     Cognitive and Communication Status:  Neurologic State: Alert  Orientation Level: Oriented X4  Cognition: Follows commands  Perception: Appears intact  Perseveration: No perseveration noted     Oral Assessment:  Oral Assessment  Labial: No impairment  Dentition: Natural;Full  Oral Hygiene: Oral mucosa moist and of good color  Lingual: No impairment  Velum: No impairment  Mandible: No impairment  P.O. Trials:  Patient Position: Seated EOB  Vocal quality prior to P.O.: No impairment  Consistency Presented: Thin liquid;Pill/Tablet  How Presented: Self-fed/presented;Straw;Successive swallows     Bolus Acceptance: No impairment  Bolus Formation/Control: No impairment     Propulsion: No impairment  Oral Residue: None  Initiation of Swallow: No impairment  Laryngeal Elevation: Functional  Aspiration Signs/Symptoms: Clear throat;Delayed cough/throat clear  Pharyngeal Phase Characteristics: Multiple swallows  Effective Modifications: Alternate liquids/solids;Double swallow  Cues for Modifications: None       Oral Phase Severity: No impairment  Pharyngeal Phase Severity : Mild    Pain:  Pain Scale 1: Numeric (0 - 10)  Pain Intensity 1: 0  After evaluation:   []             Patient left in no apparent distress sitting up in chair  [x]             Patient left in no apparent distress in bed  []             Call bell left within reach  [x]             Nursing notified  []             Family present  []             Caregiver present  []             Bed alarm activated      COMMUNICATION/EDUCATION:   [x]             Aspiration precautions; swallow safety; compensatory techniques.  [x]             Patient/family have participated as able in  goal setting and plan of care.  []             Patient/family agree to work toward stated goals and plan of care.  []             Patient understands intent and goals of therapy; neutral about participation.  []             Patient unable to participate in goal setting/plan of care; educ ongoing with interdisciplinary staff  []          Posted safety precautions in patient's room.    The patient's plan of care including recommendations, planned interventions, and recommended diet changes were discussed with: Physician.     Thank you for this referral.  Marvell Fuller, SLP  Time Calculation: 16 mins

## 2021-03-27 NOTE — Progress Notes (Signed)
Discussed case interviewed patient  Taking and tolerating medications well  She has some difficulties with ongoing anxiety  History of medications discussed  She has not responded well to Vistaril or risperidone  We have discussed possibility of very low-dose benzodiazepine  She denies acute psychotic symptoms  Denies intention to harm self or others    Mental status exam  Alert oriented fair eye contact  Speech is goal-directed soft tone  Mood is depressed anxious  Affect is restricted anxious  Thought process linear and logical  Insight and judgment fair    Recommendations  In addition to her current medicines I will start her  low-dose diazepam 2 mg daily starting today  Discussed indications and risks  Continue inpatient treatments  Discussed in treatment team

## 2021-03-28 LAB — POCT GLUCOSE: POC Glucose: 89 mg/dL (ref 65–117)

## 2021-03-28 LAB — GLUCOSE, POC: Glucose (POC): 89 mg/dL (ref 65–117)

## 2021-03-28 MED FILL — DIAZEPAM 2 MG TAB: 2 mg | ORAL | Qty: 1

## 2021-03-28 MED FILL — ASPIRIN 81 MG CHEWABLE TAB: 81 mg | ORAL | Qty: 1

## 2021-03-28 MED FILL — PRAVASTATIN 40 MG TAB: 40 mg | ORAL | Qty: 1

## 2021-03-28 MED FILL — ARIPIPRAZOLE 5 MG TAB: 5 mg | ORAL | Qty: 1

## 2021-03-28 MED FILL — CALCIUM CARBONATE 200 MG (500 MG) CHEWABLE TAB: 200 mg calcium (500 mg) | ORAL | Qty: 1

## 2021-03-28 MED FILL — METFORMIN 500 MG TAB: 500 mg | ORAL | Qty: 1

## 2021-03-28 MED FILL — ARIPIPRAZOLE 2 MG TAB: 2 mg | ORAL | Qty: 1

## 2021-03-28 MED FILL — BUSPIRONE 5 MG TAB: 5 mg | ORAL | Qty: 1

## 2021-03-28 MED FILL — TOPIRAMATE 25 MG TAB: 25 mg | ORAL | Qty: 4

## 2021-03-28 MED FILL — LISINOPRIL 5 MG TAB: 5 mg | ORAL | Qty: 1

## 2021-03-28 MED FILL — ESTRADIOL 1 MG TAB: 1 mg | ORAL | Qty: 2

## 2021-03-28 MED FILL — TRAZODONE 100 MG TAB: 100 mg | ORAL | Qty: 1

## 2021-03-28 NOTE — Progress Notes (Signed)
Patient actively participated and engaged in Spirituality Group about rest and reviewing coping skills in the Behavioral Health unit. Chaplain utilized music, words of encouragement, affirmation, scripture, and testimony to facilitate the group discussion and enhance group dynamics.  Rev. LaToya Swazil[place], MDiv, Chaplain

## 2021-03-28 NOTE — Behavioral Health Treatment Team (Signed)
B: Patient is alert and oriented x 3. Up in dayroom for Wrap Up Session. Patient continues to rate her depression at 3 and her anxiety at 2. Denies SI/HI ideations. Patient has been interacting well with peers and staff. Attends and participates in groups. Patient informed me that Valium and Trazadone had been added to her medications. Reviewed night time medications with patient. Patient knows her medications and what these medications are used for. Patient has not been seen by Speech therapy as ordered on "Sunday night for problems with swallowing that patient said at that time was due to her anxiety. No problems noted with patient swallowing food at snack time or when taking medications followed with water. Will continue to monitor every 15 minutes for safety.  I: Provide medications as ordered. Continue to monitor every 15 minute for safety.   R: Patient takes all medications as ordered. Patient states that she uses deep breathing techniques and coloring as a coping mechanism to help with panic attacks. Suggested patient that walking outdoors is an excellent way to help with anxiety. Patient states she also enjoys reading. Patient sleeps well at night however patient is taking Trazadone 100 mg and Topamax 100 mg at 2100. Patient receives Valium   2 mg PO q am. Patient has been asking for Ativan and other Benzo's since admission. Will continue to monitor every 15 minutes for safety.  P: Working on discharge planning and coping skills to handle panic attacks.       06" 00 Patient got up 1 time last pm around 3am and wanted me to call Dr. 01 to get something ordered for anxiety. Instructed patient that she had received a lot of medication at 2100 and that she needed to use her coping skills to decrease her anxiety. Patient was sleep with next 15 minute check. 0600 BS 106

## 2021-03-28 NOTE — Behavioral Health Treatment Team (Signed)
 Behavioral Health Treatment Team Note     Patient goal(s) for today: get back to normal  Treatment team focus/goals: Continue medication and work on safe discharge planning      Progress note: The patient denied SI/HI/AVH at this time and described her mood as pretty good presenting an anxious mood with a flat affect and was oriented x4. She expressed being upset about not going home today and the writer validated her feelings and prompted her not to rush herself into results that she wants. And that more time is recommended to stabilize her on medication and solidify a safe discharge plan.     LOS:  4  Expected LOS: 5-7 Days    Insurance info/prescription coverage: Medicare   Date of last family contact:  03/26/2021  Family requesting physician contact today:  no  Discharge plan:  Medication management and mood stabilization   Guns in the home:  no   Outpatient provider(s): Dr. Deloria, N.P. Jon Berth, and Aleck Frankel     Participating treatment team members: [address] Christa Ashton, [place]

## 2021-03-28 NOTE — Behavioral Health Treatment Team (Signed)
B:  Pt. Alert and oriented x 3.  Pt. States depression is a 3.  Pt. States anxiety is 2.  Pt. denies hallucinations.  Denies SI/HI.  No complaints with assessment.  Patient states that she wants to go home this afternoon.   I:   Administer medications as ordered and needed, Encourage pt to attend and participate in groups, encourage pt to be up for all meals and snacks, consuming all each time, encourage pt to interact with peers in a positive manner. Q 15 minute safety checks continue.  R:   Compliant with medications.  does attend groups, does participate.  Pt. is getting up for meals, consumes 75% of meals, snacks. Pt. does interact with peers. no safety concerns at this time.  P:   Pt. Will develop and continue to utilize positive coping skills. Will continue to follow program of group activities and participate.  Will continue to monitor q15 minutes per unit policy.

## 2021-03-28 NOTE — Progress Notes (Signed)
Discussed case interviewed patient  Taking and tolerating medications well  Slightly less anxious  Now open to considering discharge and safety planning  She has some supports at home  Attends groups interacts with peers and staff  Continues to be anxious but less so than before  Denies acute psychotic symptoms  Able to maintain safe behaviors on the unit  A little bit more interactive    Mental status exam  Alert oriented cooperative  Speech is goal-directed soft tone  Mood is depressed affect is anxious  Thought process linear and logical  Insight and judgment fair  Does not appear to respond to internal stimuli    Recommendations  Continue inpatient treatments  If she continues to improve we will consider discharge tomorrow

## 2021-03-28 NOTE — Behavioral Health Treatment Team (Signed)
Pt slept fairly well tonight with no complaints voiced and no problems noted on rounds. Safe on unit will continue to monitor.

## 2021-03-29 LAB — POCT GLUCOSE: POC Glucose: 106 mg/dL (ref 65–117)

## 2021-03-29 LAB — GLUCOSE, POC: Glucose (POC): 106 mg/dL (ref 65–117)

## 2021-03-29 MED ORDER — DIAZEPAM 2 MG TAB
2 mg | ORAL_TABLET | Freq: Every day | ORAL | 0 refills | Status: DC
Start: 2021-03-29 — End: 2021-04-26

## 2021-03-29 MED ORDER — ARIPIPRAZOLE 5 MG TAB
5 mg | ORAL_TABLET | Freq: Every evening | ORAL | 1 refills | Status: AC
Start: 2021-03-29 — End: 2021-04-28

## 2021-03-29 MED ORDER — METFORMIN 500 MG TAB
500 mg | ORAL_TABLET | Freq: Two times a day (BID) | ORAL | 1 refills | Status: AC
Start: 2021-03-29 — End: 2021-04-28

## 2021-03-29 MED ORDER — ARIPIPRAZOLE 2 MG TAB
2 mg | ORAL_TABLET | Freq: Every day | ORAL | 1 refills | Status: AC
Start: 2021-03-29 — End: 2021-04-29

## 2021-03-29 MED FILL — LISINOPRIL 5 MG TAB: 5 mg | ORAL | Qty: 1

## 2021-03-29 MED FILL — ESTRADIOL 1 MG TAB: 1 mg | ORAL | Qty: 2

## 2021-03-29 MED FILL — METFORMIN 500 MG TAB: 500 mg | ORAL | Qty: 1

## 2021-03-29 MED FILL — ASPIRIN 81 MG CHEWABLE TAB: 81 mg | ORAL | Qty: 1

## 2021-03-29 MED FILL — CALCIUM CARBONATE 200 MG (500 MG) CHEWABLE TAB: 200 mg calcium (500 mg) | ORAL | Qty: 1

## 2021-03-29 MED FILL — DIAZEPAM 2 MG TAB: 2 mg | ORAL | Qty: 1

## 2021-03-29 MED FILL — ARIPIPRAZOLE 2 MG TAB: 2 mg | ORAL | Qty: 1

## 2021-03-29 MED FILL — BUSPIRONE 5 MG TAB: 5 mg | ORAL | Qty: 1

## 2021-03-29 NOTE — Behavioral Health Treatment Team (Signed)
 Behavioral Health Transition Record to Provider    Patient Name: Mallory Bonilla  Date of Birth: [birth year]  Medical Record Number: [id number]  Date of Admission: 03/23/2021  Date of Discharge: 03/29/2021    Attending Provider: Rigoberto Elpidio POUR, MD  Discharging Provider: [facility]   To contact this individual call [phone] and ask the operator to page.  If unavailable, ask to be transferred to [facility] Provider on call.  A Behavioral Health Provider will be available on call 24/7 and during holidays.    Primary Care Provider: Deloria Damien ORN, NP    No Known Allergies    Reason for Admission: The patient reports that she lost 4 people in her life within 3 weeks of one another including a sister, cousin, her mom, and her ex husband. And that she has been feeling depressed and anxious, with panic attacks that have been increasing.      Admission Diagnosis: MDD (major depressive disorder) [F32.9]    * No surgery found *    Results for orders placed or performed during the hospital encounter of 03/23/21   COVID-19 WITH INFLUENZA A/B   Result Value Ref Range    SARS-CoV-2 by PCR Not Detected Not Detected      Influenza A by PCR Not Detected Not Detected      Influenza B by PCR Not Detected Not Detected     CBC WITH AUTOMATED DIFF   Result Value Ref Range    WBC 7.3 4.4 - 11.3 K/uL    RBC 5.43 4.50 - 5.90 M/uL    HGB 13.9 13.5 - 17.5 g/dL    HCT 57.0 36 - 46 %    MCV 79.0 (L) 80 - 100 FL    MCH 25.6 (L) 31 - 34 PG    MCHC 32.4 31.0 - 36.0 g/dL    RDW 85.9 88.4 - 85.4 %    PLATELET 163 150 - 400 K/uL    MPV 8.9 6.5 - 11.5 FL    NRBC 0.2 PER 100 WBC    ABSOLUTE NRBC 0.01 K/uL    NEUTROPHILS 53 42 - 75 %    LYMPHOCYTES 37 20.5 - 51.1 %    MONOCYTES 6 1.7 - 9.3 %    EOSINOPHILS 3 (H) 0.9 - 2.9 %    BASOPHILS 1 0.0 - 2.5 %    ABS. NEUTROPHILS 3.9 1.8 - 7.7 K/UL    ABS. LYMPHOCYTES 2.7 1.0 - 4.8 K/UL    ABS. MONOCYTES 0.5 0.2 - 2.4 K/UL    ABS. EOSINOPHILS 0.2 0.0 - 0.7 K/UL    ABS.  BASOPHILS 0.1 0.0 - 0.2 K/UL   METABOLIC PANEL, COMPREHENSIVE   Result Value Ref Range    Sodium 139 136 - 145 mmol/L    Potassium 3.3 (L) 3.5 - 5.1 mmol/L    Chloride 104 97 - 108 mmol/L    CO2 26 21 - 32 mmol/L    Anion gap 9 5 - 15 mmol/L    Glucose 128 (H) 65 - 100 mg/dL    BUN 14 6 - 20 mg/dL    Creatinine 9.22 9.44 - 1.02 mg/dL    BUN/Creatinine ratio 18 12 - 20      GFR est AA >60 >60 ml/min/1.66m2    GFR est non-AA >60 >60 ml/min/1.36m2    Calcium 8.9 8.5 - 10.1 mg/dL    Bilirubin, total 0.3 0.2 - 1.0 mg/dL    AST (SGOT) 31 15 - 37  U/L    ALT (SGPT) 41 12 - 78 U/L    Alk. phosphatase 94 45 - 117 U/L    Protein, total 6.9 6.4 - 8.2 g/dL    Albumin 3.5 3.5 - 5.0 g/dL    Globulin 3.4 2.0 - 4.0 g/dL    A-G Ratio 1.0 (L) 1.1 - 2.2     ACETAMINOPHEN    Result Value Ref Range    Acetaminophen  level <10 (L) 10 - 30 ug/mL    Reported dose date Not provided      Reported dose: Not provided Units   SALICYLATE   Result Value Ref Range    Salicylate level <1.7 (L) 2.8 - 20.0 mg/dL    Reported dose date Not provided      Reported dose: Not provided Units   ETHYL ALCOHOL   Result Value Ref Range    ALCOHOL(ETHYL),SERUM <4 <10 mg/dL   DRUG SCREEN, URINE   Result Value Ref Range    AMPHETAMINES Negative Negative      BARBITURATES Negative Negative      BENZODIAZEPINES Positive (A) Negative      COCAINE Negative Negative      ECSTASY, MDMA Negative Negative      METHADONE Negative Negative      OPIATES Negative Negative      PCP(PHENCYCLIDINE) Negative Negative      THC (TH-CANNABINOL) Negative Negative      Drug screen comment PH=6.0    URINALYSIS W/ RFLX MICROSCOPIC   Result Value Ref Range    Color Yellow/Straw      Appearance Clear Clear      Specific gravity >1.030 (H) 1.003 - 1.030    Specific gravity >1.030     pH (UA) 6.0 5.0 - 8.0      Protein Negative Negative mg/dL    Glucose 749 (A) Negative mg/dL    Ketone Negative Negative mg/dL    Bilirubin Negative Negative      Blood Negative Negative      Urobilinogen 0.2 0.2  - 1.0 EU/dL    Nitrites Negative Negative      Leukocyte Esterase Small (A) Negative     URINE MICROSCOPIC   Result Value Ref Range    WBC 20-50 0 - 5 /hpf    RBC 5-10 0 - 3 /hpf    Bacteria 2+ (A) Negative /hpf   LIPID PANEL   Result Value Ref Range    LIPID PROFILE        Cholesterol, total 168 <200 mg/dL    Triglyceride 843 (H) <150 mg/dL    HDL Cholesterol 45 mg/dL    LDL, calculated 08.1 0 - 100 mg/dL    VLDL, calculated 68.7 mg/dL    CHOL/HDL Ratio 3.7 0.0 - 5.0     GLUCOSE, POC   Result Value Ref Range    Glucose (POC) 114 65 - 117 mg/dL    Performed by ACREE SHARON    GLUCOSE, POC   Result Value Ref Range    Glucose (POC) 110 65 - 117 mg/dL    Performed by ACREE SHARON    GLUCOSE, POC   Result Value Ref Range    Glucose (POC) 95 65 - 117 mg/dL    Performed by WADDELL CASE    GLUCOSE, POC   Result Value Ref Range    Glucose (POC) 89 65 - 117 mg/dL    Performed by KENNYTH SHUCK    GLUCOSE, POC   Result Value Ref Range  Glucose (POC) 106 65 - 117 mg/dL    Performed by ACREE SHARON    EKG, 12 LEAD, INITIAL   Result Value Ref Range    Ventricular Rate 85 BPM    Atrial Rate 85 BPM    P-R Interval 140 ms    QRS Duration 104 ms    Q-T Interval 391 ms    QTC Calculation (Bezet) 465 ms    Calculated P Axis 37 degrees    Calculated R Axis 32 degrees    Calculated T Axis 53 degrees    Diagnosis       Sinus rhythm  RSR' in V1 or V2, right VCD or RVH    Confirmed by Niazi, Kareem [phone]) on 03/26/2021 11:14:32 AM         Immunizations administered during this encounter:   There is no immunization history on file for this patient.    Screening for Metabolic Disorders for Patients on Antipsychotic Medications  (Data obtained from the EMR)    Estimated Body Mass Index  Estimated body mass index is 30.62 kg/m as calculated from the following:    Height as of this encounter: 5' 5 (1.651 m).    Weight as of this encounter: 83.5 kg (184 lb).     Vital Signs/Blood Pressure  Visit Vitals  BP 128/76   Pulse 84   Temp 97.7 F (36.5  C)   Resp 18   Ht 5' 5 (1.651 m)   Wt 83.5 kg (184 lb)   SpO2 97%   Breastfeeding No   BMI 30.62 kg/m       Blood Glucose/Hemoglobin A1c  Lab Results   Component Value Date/Time    Glucose 128 (H) 03/23/2021 10:50 PM    Glucose (POC) 106 03/29/2021 05:55 AM       Lab Results   Component Value Date/Time    Hemoglobin A1c 6.2 (H) 02/16/2021 02:45 AM        Lipid Panel  Lab Results   Component Value Date/Time    Cholesterol, total 168 03/25/2021 05:20 AM    HDL Cholesterol 45 03/25/2021 05:20 AM    LDL, calculated 91.8 03/25/2021 05:20 AM    Triglyceride 156 (H) 03/25/2021 05:20 AM    CHOL/HDL Ratio 3.7 03/25/2021 05:20 AM        Discharge Diagnosis:  MDD (major depressive disorder) [F32.9]      Discharge Plan: The patient will discharge home with local resources and medication management through [facility].     Discharge Medication List and Instructions:   Current Discharge Medication List          Unresulted Labs (24h ago, onward)            None        To obtain results of studies pending at discharge, please contact [phone]    Follow-up Information     Follow up With Specialties Details Why Contact Info    Aleck Frankel   Call in 1 day [facility] Follow Up [address] [place], [place] [address]  [phone]    Jon Berth   Call in 1 day [facility] Follow Up [address], [place] [address]   [phone]    [facility]  Call in 1 day Call to Confirm Appointment tomorrow for Medication Managment on 04/02/2021 @1 :00PM [address] [place] [address]  [phone]          Advanced Directive:   Does the patient have an appointed surrogate decision maker?  No  Does the patient have a Medical Advance Directive? No  Does the patient have a Psychiatric Advance Directive? No  If the patient does not have a surrogate or Medical Advance Directive AND Psychiatric Advance Directive, the patient was offered information on these advance directives Patient will complete at a later time    Patient Instructions: Please continue  all medications until otherwise directed by physician.  [phone]    Tobacco Cessation Discharge Plan:   Is the patient a smoker and needs referral for smoking cessation? No  Patient referred to the following for smoking cessation with an appointment? No     Patient was offered medication to assist with smoking cessation at discharge? No  Was education for smoking cessation added to the discharge instructions? No    Alcohol/Substance Abuse Discharge Plan:   Does the patient have a history of substance/alcohol abuse and requires a referral for treatment? No  Patient referred to the following for substance/alcohol abuse treatment with an appointment? No  Patient was offered medication to assist with alcohol cessation at discharge? No  Was education for substance/alcohol abuse added to discharge instructions? No    Patient discharged to Home; provided to the patient/caregiver either in hard copy or electronically.

## 2021-03-29 NOTE — Behavioral Health Treatment Team (Signed)
Dr. Donalynn Furlong in to see pt.  New orders to discharge home today.

## 2021-03-29 NOTE — Discharge Summary (Signed)
Discharge summary    Hospital course  [name] is a 55 year old female admitted to acute unit of [facility] March 25 and discharged March 29, 2021.  She was mostly under my care and Dr. Glenford Peers care.  She presented with suicidal ideations and intense level of being anxious and could not pledge for safety.  She had past history of depression and panic disorder.  She also has been having some family relational difficulties has been having difficulty functioning and has been having thoughts and threats of suicidality and anxiety this has been worsening for 2 weeks before admission.  Her family has been concerned about her level of decline in intensity and safety concerns and they also were suggesting hospitalization along with outpatient provider nurse practitioner Garey Ham.  She might have also been using Klonopin and benzodiazepines for anxiety but still feeling anxious.  While in the hospital she benefited from inpatient treatments.  This included psychosocial interventions and psychiatric management.  She was started on Abilify 2 times a day with a total dose of 7 mg daily and she tolerated this medication well.  She was taking 2 mg and then 5 mg of Abilify which she tolerated well on a daily basis.  We also adjusted her medication and benzodiazepine diazepam.  She was able to maintain safe behaviors on the unit she interacted with peers and staff attended groups and worked on her treatment.  Her communications with family was also helpful.  As her stay continued her mood was improving and she was sleeping better eating better and was less anxious.  We worked on her discharge safety plan and also discussed her  progress on our multidisciplinary treatment team.  On March 31 we decided that the goals of acute unit stay was met and that she could continue her treatment in the outpatient setting thus she was discharged that day.  She denied suicidal ideations and pledged for safety at  discharge.    Discharge mental status exam  Alert and oriented good eye contact  Speech is goal-directed spontaneous normal tone  Mood is good affect is appropriate  Thought process linear and logical  Thought content with no suicidal ideation homicidal ideations  Does not appear to respond to internal stimuli  Insight and judgment fair    Discharge diagnoses  Bipolar 1 disorder  Anxiety disorder unspecified    Recommendations  Continue current medication regimen including Abilify 2 mg in the morning and 5 mg at night  Outpatient appointments or referrals were made  Pledges for safety outside the hospital    Discharge condition  Improved    Prognosis  Good, we wish her well

## 2021-03-29 NOTE — Telephone Encounter (Signed)
Mallory Bonilla, Child psychotherapist, [facility] would like for you to give her a call.      She may be reached at [phone].

## 2021-03-29 NOTE — Behavioral Health Treatment Team (Signed)
Pt. Belongings given back to pt., verified all there, signed for. Discharge instructions explained to pt. Including, Follow up appt. With [facility],  Landry Dyke, Deatra Canter, and unity [facility] .  Medications called into [facility] in [place], [place].  Pt. Denies SI/HI at time of discharge. Pt. is not a Smoker. Smoking cessation education was notprovided.  Pt. is not a drinker.  Alcohol Education was not Provided.  Discharge Paperwork and H & P, faxed to [facility] and Angela Allen-Blaine, With success sheet. Pt. was not discharged on 2 or more Antipsychotics.  Pt. Displayed no safety concerns at discharge.  Pt. Escorted to front by staff for transportation by Partner, to home.

## 2021-03-29 NOTE — Behavioral Health Treatment Team (Signed)
DISCHARGE SUMMARY    NAME:Mallory Bonilla  DOB: 1966/01/28  MRN: [id number]    The patient Mallory Bonilla exhibits the ability to control behavior in a less restrictive environment.  Patient's level of functioning is improving.  No assaultive/destructive behavior has been observed for the past 24 hours.  No suicidal/homicidal threat or behavior has been observed for the past 24 hours.  There is no evidence of serious medication side effects.  Patient has not been in physical or protective restraints for at least the past 24 hours.    If weapons involved, how are they secured? No weapons involved     Is patient aware of and in agreement with discharge plan? YES    Arrangements for medication:  Prescriptions given to patient, given a weeks supply or 30 day supply.     Copy of discharge instructions to provider?:  YES    Arrangements for transportation home: The patient will be transported home by her partner   Keep all follow up appointments as scheduled, continue to take prescribed medications per physician instructions.  Mental health crisis number:  911 or your local mental health crisis line number at 3315026696      Mental Health Emergency WARM LINE      1-866-400-MHAV 5030297463)      M-F: 9am to 9pm      Sat & Sun: 5pm - 9pm  National suicide prevention lines:                             1-800-SUICIDE (905) 413-5619)       1-800-273-TALK 727-849-4258)   24/7 Crisis Text Line:  Text HOME to (202) 788-4618

## 2021-03-29 NOTE — Behavioral Health Treatment Team (Deleted)
Pt was discharged this evening. Pt denied any SI/HI prior to being discharge. Staff escorted the pt off the unit. No problems or concerns.

## 2021-03-29 NOTE — Behavioral Health Treatment Team (Signed)
The patient signed her medicare letter.

## 2021-03-29 NOTE — Behavioral Health Treatment Team (Signed)
B:  Pt. Alert and oriented x 4.  Pt. States depression is a 0.  Pt. States anxiety is a 4.  Pt. Denies hallucinations.  Denies SI/HI.  Cooperative with assessment. Pt. States she just feels like their is going to be no help with her anxiety without a stronger "Benzo". Writer discussed distraction techniques, mindset, allowing other things to help, as her anxiety is probably increased even more focusing on the not having her "Benzo's".   I:   Administer medications as ordered and needed, Encourage pt to attend and participate in groups, encourage pt to be up for all meals and snacks, consuming all each time, encourage pt to interact with peers in a positive manner. Q 15 minute safety checks continue.  R:   Compliant with medications.  Does attend groups, Does participate.  Pt. is getting up for meals, consumes all of meals, snacks. Pt. does interact with peers. no safety concerns at this time.  P:   Pt. Will develop and continue to utilize positive coping skills.

## 2021-04-05 NOTE — Telephone Encounter (Signed)
Patient would like for you to give her a call.  It is in reference to the medication-Klonopin

## 2021-04-10 ENCOUNTER — Encounter: Attending: Registered Nurse | Primary: Family

## 2021-04-10 ENCOUNTER — Encounter: Payer: MEDICARE | Attending: Registered Nurse | Primary: Family

## 2021-04-19 ENCOUNTER — Ambulatory Visit: Admit: 2021-04-19 | Discharge: 2021-04-19 | Payer: MEDICARE | Attending: Registered Nurse | Primary: Family

## 2021-04-19 ENCOUNTER — Ambulatory Visit: Attending: Registered Nurse | Primary: Family

## 2021-04-19 DIAGNOSIS — F411 Generalized anxiety disorder: Secondary | ICD-10-CM

## 2021-04-19 MED ORDER — BUSPIRONE 30 MG TAB
30 mg | ORAL_TABLET | Freq: Two times a day (BID) | ORAL | 0 refills | Status: DC
Start: 2021-04-19 — End: 2021-07-30

## 2021-04-19 MED ORDER — TRAZODONE 100 MG TAB
100 mg | ORAL_TABLET | Freq: Every evening | ORAL | 0 refills | Status: DC | PRN
Start: 2021-04-19 — End: 2021-07-09

## 2021-04-19 NOTE — Progress Notes (Signed)
Mallory Bonilla is a 55 y.o. female who presents today for the following:  Chief Complaint   Patient presents with   ??? Follow-up     "Severe anxiety and panic attacks."   ??? Anxiety   ??? Depression   ??? Medication Management       No Known Allergies    Current Outpatient Medications   Medication Sig   ??? busPIRone (BUSPAR) 30 mg tablet Take 1 Tablet by mouth two (2) times a day for 90 days. Indications: repeated episodes of anxiety   ??? traZODone (DESYREL) 100 mg tablet Take 2 Tablets by mouth nightly as needed for Sleep for up to 90 days. Indications: insomnia associated with depression   ??? lisinopriL (PRINIVIL, ZESTRIL) 5 mg tablet Take 5 mg by mouth daily.   ??? diazePAM (VALIUM) 2 mg tablet Take 1 Tablet by mouth daily for 30 days. Max Daily Amount: 2 mg. Indications: anxious   ??? metFORMIN (GLUCOPHAGE) 500 mg tablet Take 1 Tablet by mouth two (2) times daily (with meals) for 30 days.   ??? ARIPiprazole (ABILIFY) 5 mg tablet Take 1 Tablet by mouth nightly for 30 days.   ??? ARIPiprazole (ABILIFY) 2 mg tablet Take 1 Tablet by mouth daily for 30 days.   ??? dulaglutide (Trulicity) 0.75 mg/0.5 mL sub-q pen 0.75 mg by SubCUTAneous route every seven (7) days. monday   ??? Emgality Pen 120 mg/mL injection    ??? ARIPiprazole (ABILIFY) 5 mg tablet Take 1 Tablet by mouth nightly for 90 days. Indications: additional treatment for major depressive disorder   ??? topiramate (Topamax) 50 mg tablet Take 1 Tablet by mouth nightly. Indications: migraine prevention   ??? hydroCHLOROthiazide (HYDRODIURIL) 25 mg tablet Take 1 Tablet by mouth daily. Indications: high blood pressure   ??? pravastatin (PRAVACHOL) 40 mg tablet Take 40 mg by mouth nightly.   ??? canagliflozin-metFORMIN (Invokamet) 150-500 mg tab Take  by mouth two (2) times a day.   ??? estradioL (ESTRACE) 2 mg tablet Take  by mouth daily.   ??? aspirin delayed-release 81 mg tablet Take 81 mg by mouth daily.     No current facility-administered medications for this visit.       Past Medical History:    Diagnosis Date   ??? Anxiety 01/11/2021   ??? Arthritis    ??? Bipolar 1 disorder (HCC) 01/11/2021   ??? Diabetes (HCC)    ??? Diabetes mellitus type 2, controlled (HCC) 01/11/2021   ??? Headache    ??? Headache 01/11/2021   ??? Hypercholesterolemia    ??? Hyperlipidemia 01/11/2021   ??? Hypertension    ??? Hypertension 01/11/2021       Past Surgical History:   Procedure Laterality Date   ??? HX OOPHORECTOMY         Family History   Problem Relation Age of Onset   ??? Diabetes Mother    ??? Cancer Mother    ??? Diabetes Sister    ??? Heart Disease Sister    ??? Cancer Sister    ??? Diabetes Brother    ??? Heart Disease Brother        Social History     Socioeconomic History   ??? Marital status: DIVORCED     Spouse name: Not on file   ??? Number of children: Not on file   ??? Years of education: Not on file   ??? Highest education level: Not on file   Occupational History   ??? Not on file   Tobacco Use   ???  Smoking status: Never Smoker   ??? Smokeless tobacco: Never Used   Vaping Use   ??? Vaping Use: Never used   Substance and Sexual Activity   ??? Alcohol use: Not Currently   ??? Drug use: Not Currently   ??? Sexual activity: Yes   Other Topics Concern   ??? Military Service No   ??? Blood Transfusions No   ??? Caffeine Concern No   ??? Occupational Exposure No   ??? Hobby Hazards No   ??? Sleep Concern No   ??? Stress Concern No   ??? Weight Concern No   ??? Special Diet No   ??? Back Care No   ??? Exercise No   ??? Bike Helmet No   ??? Seat Belt No   ??? Self-Exams No   Social History Narrative   ??? Not on file     Social Determinants of Health     Financial Resource Strain:    ??? Difficulty of Paying Living Expenses: Not on file   Food Insecurity:    ??? Worried About Running Out of Food in the Last Year: Not on file   ??? Ran Out of Food in the Last Year: Not on file   Transportation Needs:    ??? Lack of Transportation (Medical): Not on file   ??? Lack of Transportation (Non-Medical): Not on file   Physical Activity:    ??? Days of Exercise per Week: Not on file   ??? Minutes of Exercise per Session: Not on  file   Stress:    ??? Feeling of Stress : Not on file   Social Connections:    ??? Frequency of Communication with Friends and Family: Not on file   ??? Frequency of Social Gatherings with Friends and Family: Not on file   ??? Attends Religious Services: Not on file   ??? Active Member of Clubs or Organizations: Not on file   ??? Attends Banker Meetings: Not on file   ??? Marital Status: Not on file   Intimate Partner Violence:    ??? Fear of Current or Ex-Partner: Not on file   ??? Emotionally Abused: Not on file   ??? Physically Abused: Not on file   ??? Sexually Abused: Not on file   Housing Stability:    ??? Unable to Pay for Housing in the Last Year: Not on file   ??? Number of Places Lived in the Last Year: Not on file   ??? Unstable Housing in the Last Year: Not on file         Mallory Bonilla follows up in clinic for bipolar I disorder and anxiety disorder.  Since last visit patient was admitted for psychiatric hospitalization March 23, 2021.  She was discharged on Abilify 2 mg tablet take 1 tablet daily and Abilify 5 mg take 1 tablet nightly, diazepam 2 mg take 1 tablet daily anxiety,  trazodone 100 mg take 1 tablet at bedtime as needed for sleep and BuSpar 15 mg tablet take 1 tablet three times daily.    [name], clean fully alert and oriented.  Mood is mild to moderately anxious and depressed without suicidal/homicidal thinking, risk for suicide is moderate to high based on history but there is no acute concern at this time.  On today's visit, patient reports still having increased anxiety with panic attacks and unable to be in crowds.  She reports missing her children's events because her anxiety is crippling.  It is  noted patient is preoccupied with adjusting diazepam dose or getting back on clonazepam.  She is aware of risks associated with benzodiazepine use.  She reports trouble staying asleep and also having trouble swallowing which she relates to anxiety.  She reports her  counselor Mallory Bonilla is "at her wits end."  She is encouraged to continue counseling, emphasis placed on cognitive behavior therapy to help with anxiety/panic attacks.  No psychotic symptoms observed or reported.  No drug or alcohol use reported.  Patient continues to live with her significant other Mallory Bonilla in a stable home environment.        Review of Systems   Psychiatric/Behavioral: Positive for depression. [name] and has insomnia.    All other systems reviewed and are negative.        Visit Vitals  Wt 86.3 kg (190 lb 3.2 oz)   BMI 31.65 kg/m??     Physical Exam  Psychiatric:         Attention and Perception: Attention and perception normal.         Mood and Affect: Mood is anxious and depressed.         Speech: Speech normal.         Behavior: Behavior is withdrawn. Behavior is cooperative.         Thought Content: Thought content normal.         Cognition and Memory: Cognition and memory normal.         Judgment: Judgment normal.          Plan:    Change BuSpar to 30 mg take 1 tablet twice daily for anxiety and depressive symptoms.  She Bonilla continue diazepam at current dose.  Continue Abilify.  For sleep-change trazodone 100 mg to 2 tablets at bedtime as needed for sleep.  Follow-up with medical provider as appropriate.  Follow-up on therapy recommendation.  For emergencies-call 911 or go to the emergency department.

## 2021-04-26 ENCOUNTER — Telehealth

## 2021-04-26 MED ORDER — DIAZEPAM 2 MG TAB
2 mg | ORAL_TABLET | Freq: Every day | ORAL | 0 refills | Status: DC
Start: 2021-04-26 — End: 2021-05-24

## 2021-04-26 NOTE — Telephone Encounter (Signed)
Patient is requesting Valium refill.

## 2021-04-30 ENCOUNTER — Encounter: Attending: Registered Nurse | Primary: Family

## 2021-04-30 ENCOUNTER — Telehealth

## 2021-04-30 MED ORDER — DIAZEPAM 2 MG TAB
2 mg | ORAL_TABLET | Freq: Every day | ORAL | 0 refills | Status: AC | PRN
Start: 2021-04-30 — End: 2021-05-03

## 2021-04-30 NOTE — Telephone Encounter (Signed)
Patient would like for you to give her a call.  Update on her medication.

## 2021-04-30 NOTE — Telephone Encounter (Signed)
Pt called and states the pharmacy will not fill due to the wording on the rx-pt would like you to contact pharmacy

## 2021-04-30 NOTE — Telephone Encounter (Signed)
Patient is requesting extra med to get through her son's graduation. They are planning to leave Friday morning and return Sunday night. I will send in Valium 2 mg 1 tab daily as needed X 3 days.

## 2021-05-02 ENCOUNTER — Ambulatory Visit: Payer: MEDICARE | Primary: Family

## 2021-05-03 NOTE — Telephone Encounter (Signed)
Patient made aware that her medication is at the pharmacy.

## 2021-05-24 ENCOUNTER — Ambulatory Visit: Admit: 2021-05-24 | Discharge: 2021-05-24 | Payer: MEDICARE | Attending: Registered Nurse | Primary: Family

## 2021-05-24 ENCOUNTER — Ambulatory Visit: Attending: Registered Nurse | Primary: Family

## 2021-05-24 DIAGNOSIS — F3177 Bipolar disorder, in partial remission, most recent episode mixed: Secondary | ICD-10-CM

## 2021-05-24 MED ORDER — DIAZEPAM 5 MG TAB
5 mg | ORAL_TABLET | Freq: Two times a day (BID) | ORAL | 0 refills | Status: AC | PRN
Start: 2021-05-24 — End: 2021-06-23

## 2021-05-24 MED ORDER — DIAZEPAM 2 MG TAB
2 mg | ORAL_TABLET | Freq: Every day | ORAL | 1 refills | Status: DC
Start: 2021-05-24 — End: 2021-05-24

## 2021-05-24 MED ORDER — ARIPIPRAZOLE 2 MG TAB
2 mg | ORAL_TABLET | Freq: Every morning | ORAL | 0 refills | Status: AC
Start: 2021-05-24 — End: 2021-08-22

## 2021-05-24 NOTE — Progress Notes (Signed)
Mallory Bonilla is a 55 y.o. female who presents today for the following:  Chief Complaint   Patient presents with   ??? Anxiety     "Medicine is helping a little bit. I was going to ask you to go up on the diazepam. I loss my job."   ??? Follow-up   ??? Bipolar       No Known Allergies    Current Outpatient Medications   Medication Sig   ??? ARIPiprazole (ABILIFY) 2 mg tablet Take 1 Tablet by mouth Every morning for 90 days.   ??? diazePAM (VALIUM) 5 mg tablet Take 0.5 Tablets by mouth two (2) times daily as needed for Anxiety for up to 30 days. Max Daily Amount: 5 mg. Indications: anxious   ??? lisinopriL (PRINIVIL, ZESTRIL) 5 mg tablet Take 5 mg by mouth daily.   ??? busPIRone (BUSPAR) 30 mg tablet Take 1 Tablet by mouth two (2) times a day for 90 days. Indications: repeated episodes of anxiety   ??? traZODone (DESYREL) 100 mg tablet Take 2 Tablets by mouth nightly as needed for Sleep for up to 90 days. Indications: insomnia associated with depression   ??? dulaglutide (Trulicity) 0.75 mg/0.5 mL sub-q pen 0.75 mg by SubCUTAneous route every seven (7) days. monday   ??? Emgality Pen 120 mg/mL injection    ??? ARIPiprazole (ABILIFY) 5 mg tablet Take 1 Tablet by mouth nightly for 90 days. Indications: additional treatment for major depressive disorder   ??? topiramate (Topamax) 50 mg tablet Take 1 Tablet by mouth nightly. Indications: migraine prevention   ??? hydroCHLOROthiazide (HYDRODIURIL) 25 mg tablet Take 1 Tablet by mouth daily. Indications: high blood pressure   ??? pravastatin (PRAVACHOL) 40 mg tablet Take 40 mg by mouth nightly.   ??? canagliflozin-metFORMIN (Invokamet) 150-500 mg tab Take  by mouth two (2) times a day.   ??? estradioL (ESTRACE) 2 mg tablet Take  by mouth daily.   ??? aspirin delayed-release 81 mg tablet Take 81 mg by mouth daily.     No current facility-administered medications for this visit.       Past Medical History:   Diagnosis Date   ??? Anxiety 01/11/2021   ??? Arthritis    ??? Bipolar 1 disorder (HCC) 01/11/2021   ??? Diabetes  (HCC)    ??? Diabetes mellitus type 2, controlled (HCC) 01/11/2021   ??? Headache    ??? Headache 01/11/2021   ??? Hypercholesterolemia    ??? Hyperlipidemia 01/11/2021   ??? Hypertension    ??? Hypertension 01/11/2021       Past Surgical History:   Procedure Laterality Date   ??? HX OOPHORECTOMY         Family History   Problem Relation Age of Onset   ??? Diabetes Mother    ??? Cancer Mother    ??? Diabetes Sister    ??? Heart Disease Sister    ??? Cancer Sister    ??? Diabetes Brother    ??? Heart Disease Brother        Social History     Socioeconomic History   ??? Marital status: DIVORCED     Spouse name: Not on file   ??? Number of children: Not on file   ??? Years of education: Not on file   ??? Highest education level: Not on file   Occupational History   ??? Not on file   Tobacco Use   ??? Smoking status: Never Smoker   ??? Smokeless tobacco: Never Used   Vaping  Use   ??? Vaping Use: Never used   Substance and Sexual Activity   ??? Alcohol use: Not Currently   ??? Drug use: Not Currently   ??? Sexual activity: Yes   Other Topics Concern   ??? Military Service No   ??? Blood Transfusions No   ??? Caffeine Concern No   ??? Occupational Exposure No   ??? Hobby Hazards No   ??? Sleep Concern No   ??? Stress Concern No   ??? Weight Concern No   ??? Special Diet No   ??? Back Care No   ??? Exercise No   ??? Bike Helmet No   ??? Seat Belt No   ??? Self-Exams No   Social History Narrative   ??? Not on file     Social Determinants of Health     Financial Resource Strain:    ??? Difficulty of Paying Living Expenses: Not on file   Food Insecurity:    ??? Worried About Running Out of Food in the Last Year: Not on file   ??? Ran Out of Food in the Last Year: Not on file   Transportation Needs:    ??? Lack of Transportation (Medical): Not on file   ??? Lack of Transportation (Non-Medical): Not on file   Physical Activity:    ??? Days of Exercise per Week: Not on file   ??? Minutes of Exercise per Session: Not on file   Stress:    ??? Feeling of Stress : Not on file   Social Connections:    ??? Frequency of Communication  with Friends and Family: Not on file   ??? Frequency of Social Gatherings with Friends and Family: Not on file   ??? Attends Religious Services: Not on file   ??? Active Member of Clubs or Organizations: Not on file   ??? Attends Banker Meetings: Not on file   ??? Marital Status: Not on file   Intimate Partner Violence:    ??? Fear of Current or Ex-Partner: Not on file   ??? Emotionally Abused: Not on file   ??? Physically Abused: Not on file   ??? Sexually Abused: Not on file   Housing Stability:    ??? Unable to Pay for Housing in the Last Year: Not on file   ??? Number of Places Lived in the Last Year: Not on file   ??? Unstable Housing in the Last Year: Not on file         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  Patient last visited clinic April 19, 2021.  She continues Abilify 2 mg tablet take 1 tablet daily and Abilify 5 mg take 1 tablet nightly, diazepam 2 mg take 1 tablet daily anxiety as needed,  trazodone 100 mg take 2 tablets at bedtime as needed for sleep and BuSpar 30 mg tablet take 1 tablet two times daily.    [name], clean fully alert and oriented.  Mood is mildly depressed and anxious without suicidal/homicidal thinking, risk for suicide is moderate based on history but there is no acute concern at this time.  Appetite is stable.  Sleep is fluctuating but trazodone works most nights.  No psychotic symptoms observed or reported.  On today's visit, she is requesting diazepam dose increase.  We discussed alternatives such as therapy which she reports that she is working with Mrs. Gatten.  Patient is very reluctant to coming  off from benzodiazepine and is requesting a higher dose.  She mentions her case is "severe."  However, she presents mildly anxious.  We discussed risks associated with benzodiazepine use such as physical dependence and addiction which patient is very aware and admits to needing benzodiazepine to  function.  Patient shares that she lost her job at the [employer] and she is unable to attend functions or leave home without having severe anxiety.  Patient has good support from her significant other.         Review of Systems   Musculoskeletal: Positive for back pain.   Psychiatric/Behavioral: [name].    All other systems reviewed and are negative.        Visit Vitals  Wt 87.7 kg (193 lb 6.4 oz)   BMI 32.18 kg/m??     Physical Exam  Psychiatric:         Attention and Perception: Attention and perception normal.         Mood and Affect: Mood is anxious.         Speech: Speech normal.         Behavior: Behavior normal. Behavior is cooperative.         Thought Content: Thought content normal.         Cognition and Memory: Cognition and memory normal.         Judgment: Judgment normal.          Plan:    For anxiety, change diazepam to 5 mg take 1/2 tablet twice daily as needed for anxiety/panic attacks.  Patient plans to look into following-up with another psychiatric provider who is willing to prescribe higher doses of diazepam.  She is considering seeing Dr. Donalynn Furlong since he started diazepam during psychiatric hospitalization in March 2022.  Continue therapy.  Follow-up with medical provider as appropriate.  For emergencies-call 911 or go to the emergency department.

## 2021-06-18 ENCOUNTER — Ambulatory Visit: Admit: 2021-06-18 | Discharge: 2021-06-18 | Payer: MEDICARE | Attending: Gastroenterology | Primary: Family

## 2021-06-18 ENCOUNTER — Ambulatory Visit: Attending: Gastroenterology | Primary: Family

## 2021-06-18 DIAGNOSIS — K219 Gastro-esophageal reflux disease without esophagitis: Secondary | ICD-10-CM

## 2021-06-18 NOTE — Progress Notes (Signed)
Chief Complaint   Patient presents with   . GERD   . Dysphagia   . Hoarse     1. Have you been to the ER, urgent care clinic since your last visit?  Hospitalized since your last visit?No    2. Have you seen or consulted any other health care providers outside of the [facility] since your last visit?  Include any pap smears or colon screening. No   Visit Vitals  BP (!) 141/64 (BP 1 Location: Left arm, BP Patient Position: Sitting, BP Cuff Size: Adult)   Pulse 78   Temp 97.8 F (36.6 C) (Oral)   Resp 18   Ht 5\' 5"  (1.651 m)   Wt 88.8 kg (195 lb 12.8 oz)   SpO2 98%   BMI 32.58 kg/m   Patient seen by Dr Gwinda Passe, EGD ordered. Patient chose June 29, 2021. She will arrive at 10:30 am for 11:30 am procedure. Instructions given. Patient has Medicare and no proior auth needed.

## 2021-06-18 NOTE — Progress Notes (Signed)
Mallory Bonilla is a 55 y.o. female who presents today for the following:  Chief Complaint   Patient presents with   ??? GERD   ??? Dysphagia   ??? Hoarse         No Known Allergies    Current Outpatient Medications   Medication Sig   ??? metFORMIN (GLUCOPHAGE) 500 mg tablet Take 500 mg by mouth two (2) times a day.   ??? ARIPiprazole (ABILIFY) 2 mg tablet Take 1 Tablet by mouth Every morning for 90 days.   ??? diazePAM (VALIUM) 5 mg tablet Take 0.5 Tablets by mouth two (2) times daily as needed for Anxiety for up to 30 days. Max Daily Amount: 5 mg. Indications: anxious   ??? lisinopriL (PRINIVIL, ZESTRIL) 5 mg tablet Take 5 mg by mouth daily.   ??? busPIRone (BUSPAR) 30 mg tablet Take 1 Tablet by mouth two (2) times a day for 90 days. Indications: repeated episodes of anxiety   ??? traZODone (DESYREL) 100 mg tablet Take 2 Tablets by mouth nightly as needed for Sleep for up to 90 days. Indications: insomnia associated with depression   ??? dulaglutide (Trulicity) 0.75 mg/0.5 mL sub-q pen 0.75 mg by SubCUTAneous route every seven (7) days. monday   ??? Emgality Pen 120 mg/mL injection 120 mg by SubCUTAneous route every thirty (30) days.   ??? topiramate (Topamax) 50 mg tablet Take 1 Tablet by mouth nightly. Indications: migraine prevention   ??? pravastatin (PRAVACHOL) 40 mg tablet Take 40 mg by mouth nightly.   ??? estradioL (ESTRACE) 2 mg tablet Take  by mouth daily.   ??? aspirin delayed-release 81 mg tablet Take 81 mg by mouth daily.   ??? hydroCHLOROthiazide (HYDRODIURIL) 25 mg tablet Take 1 Tablet by mouth daily. Indications: high blood pressure     No current facility-administered medications for this visit.       Past Medical History:   Diagnosis Date   ??? Acid reflux 06/13/2021   ??? Agoraphobia    ??? Anxiety 01/11/2021   ??? Anxiety    ??? Arthritis    ??? Bipolar 1 disorder (HCC) 01/11/2021   ??? Diabetes (HCC)    ??? Diabetes mellitus type 2, controlled (HCC) 01/11/2021   ??? Headache    ??? Headache 01/11/2021   ??? Hypercholesterolemia    ??? Hyperlipidemia  01/11/2021   ??? Hypertension    ??? Hypertension 01/11/2021   ??? Throat pain        Past Surgical History:   Procedure Laterality Date   ??? COLONOSCOPY  05/30/2017   ??? COLONOSCOPY,DIAGNOSTIC     ??? HX GYN      uterine ablasion   ??? HX OOPHORECTOMY     ??? HX ORTHOPAEDIC      foot surgery       Family History   Problem Relation Age of Onset   ??? Diabetes Mother    ??? Cancer Mother    ??? Diabetes Sister    ??? Heart Disease Sister    ??? Cancer Sister    ??? Diabetes Brother    ??? Heart Disease Brother    ??? Liver Disease Other    ??? Macular Degen Other        Social History     Socioeconomic History   ??? Marital status: DIVORCED     Spouse name: Not on file   ??? Number of children: Not on file   ??? Years of education: Not on file   ??? Highest education level: Not  on file   Occupational History   ??? Not on file   Tobacco Use   ??? Smoking status: Never Smoker   ??? Smokeless tobacco: Never Used   Vaping Use   ??? Vaping Use: Never used   Substance and Sexual Activity   ??? Alcohol use: Not Currently   ??? Drug use: Not Currently   ??? Sexual activity: Yes   Other Topics Concern   ??? Military Service No   ??? Blood Transfusions No   ??? Caffeine Concern No   ??? Occupational Exposure No   ??? Hobby Hazards No   ??? Sleep Concern No   ??? Stress Concern No   ??? Weight Concern No   ??? Special Diet No   ??? Back Care No   ??? Exercise No   ??? Bike Helmet No   ??? Seat Belt No   ??? Self-Exams No   Social History Narrative   ??? Not on file     Social Determinants of Health     Financial Resource Strain:    ??? Difficulty of Paying Living Expenses: Not on file   Food Insecurity:    ??? Worried About Running Out of Food in the Last Year: Not on file   ??? Ran Out of Food in the Last Year: Not on file   Transportation Needs:    ??? Lack of Transportation (Medical): Not on file   ??? Lack of Transportation (Non-Medical): Not on file   Physical Activity:    ??? Days of Exercise per Week: Not on file   ??? Minutes of Exercise per Session: Not on file   Stress:    ??? Feeling of Stress : Not on file   Social  Connections:    ??? Frequency of Communication with Friends and Family: Not on file   ??? Frequency of Social Gatherings with Friends and Family: Not on file   ??? Attends Religious Services: Not on file   ??? Active Member of Clubs or Organizations: Not on file   ??? Attends Banker Meetings: Not on file   ??? Marital Status: Not on file   Intimate Partner Violence:    ??? Fear of Current or Ex-Partner: Not on file   ??? Emotionally Abused: Not on file   ??? Physically Abused: Not on file   ??? Sexually Abused: Not on file   Housing Stability:    ??? Unable to Pay for Housing in the Last Year: Not on file   ??? Number of Places Lived in the Last Year: Not on file   ??? Unstable Housing in the Last Year: Not on file         HPI  55 year old female with history of hypertension, hyperlipidemia, gastroesophageal reflux disease, diabetes mellitus type 2, and depression/anxiety who comes in for evaluation of GERD, dysphagia, and hoarseness.  Patient states she has a lot of hoarseness for the last 3 to 4 years which has been getting worse.  States a lot of saliva builds up in her throat and is hard to swallow down.  She also has some tightness in her throat at times.  She states any food (solid) are hard to swallow.  States she does have a lot of anxiety.  Her swallowing tends to be worse when she is away from home.  She does take Tums a few times a day to treat the GERD.  No abdominal or chest pain.  Does have frequent hiccups at least 4-5 times a  day lasting for few minutes.  She states she generally has the symptoms all day, but not as much at night.  She states present her main problem is with the ability to eat solid foods.  Her weight has remained stable because she has a lot of snacks and sweets, especially ice cream.    Review of Systems   Constitutional: Negative.    HENT: Negative.  Negative for nosebleeds.    Eyes: Negative.    Respiratory: Negative.    Cardiovascular: Negative.    Gastrointestinal: Positive for abdominal  pain, heartburn, nausea and vomiting. Negative for blood in stool, constipation, diarrhea and melena.   Genitourinary: Negative.    Musculoskeletal: Negative.    Skin: Negative.    Neurological: Negative.    Endo/Heme/Allergies: Negative.    Psychiatric/Behavioral: Positive for substance abuse.   All other systems reviewed and are negative.        Visit Vitals  BP (!) 141/64 (BP 1 Location: Left arm, BP Patient Position: Sitting, BP Cuff Size: Adult)   Pulse 78   Temp 97.8 ??F (36.6 ??C) (Oral)   Resp 18   Ht 5\' 5"  (1.651 m)   Wt 88.8 kg (195 lb 12.8 oz)   SpO2 98%   BMI 32.58 kg/m??     Physical Exam  Vitals and nursing note reviewed.   Constitutional:       Appearance: Normal appearance. She is obese.   HENT:      Head: Normocephalic and atraumatic.      Nose: Nose normal.      Mouth/Throat:      Mouth: Mucous membranes are moist.      Pharynx: Oropharynx is clear.   Eyes:      General: No scleral icterus.     Conjunctiva/sclera: Conjunctivae normal.      Pupils: Pupils are equal, round, and reactive to light.   Cardiovascular:      Rate and Rhythm: Normal rate and regular rhythm.      Pulses: Normal pulses.      Heart sounds: Normal heart sounds.   Pulmonary:      Effort: Pulmonary effort is normal.      Breath sounds: Normal breath sounds.   Abdominal:      General: Bowel sounds are normal. There is no distension.      Palpations: Abdomen is soft. There is no mass.      Tenderness: There is abdominal tenderness. There is no right CVA tenderness, left CVA tenderness, guarding or rebound.      Hernia: No hernia is present.   Musculoskeletal:         General: Normal range of motion.      Cervical back: Normal range of motion and neck supple.   Skin:     General: Skin is warm and dry.      Coloration: Skin is not jaundiced.   Neurological:      General: No focal deficit present.      Mental Status: She is alert and oriented to person, place, and time.   Psychiatric:         Mood and Affect: Mood normal.          Behavior: Behavior normal.         Thought Content: Thought content normal.         Judgment: Judgment normal.            1. Gastroesophageal reflux disease, unspecified whether esophagitis present  As bridge for  an upper endoscopy to further evaluate.  Patient may need esophageal dilatation depending on findings.  We will also plan to start patient on daily PPI depending on results of the EGD.  - UPPER GI ENDOSCOPY,DIAGNOSIS; Future    2. Esophageal dysphagia  May have to perform esophageal dilatation  - UPPER GI ENDOSCOPY,DIAGNOSIS; Future    3. Hoarse  Probably related to GERD    4. Hiccough  Likely secondary to inflammation in the stomach and/or esophagus

## 2021-06-29 ENCOUNTER — Inpatient Hospital Stay: Payer: MEDICARE

## 2021-06-29 ENCOUNTER — Encounter

## 2021-06-29 LAB — POCT GLUCOSE: POC Glucose: 88 mg/dL (ref 65–117)

## 2021-06-29 LAB — GLUCOSE, POC: Glucose (POC): 88 mg/dL (ref 65–117)

## 2021-06-29 MED ORDER — SODIUM CHLORIDE 0.9 % IJ SYRG
Freq: Three times a day (TID) | INTRAMUSCULAR | Status: DC
Start: 2021-06-29 — End: 2021-06-29

## 2021-06-29 MED ORDER — PROPOFOL 10 MG/ML IV EMUL
10 mg/mL | INTRAVENOUS | Status: DC | PRN
Start: 2021-06-29 — End: 2021-06-29
  Administered 2021-06-29 (×2): via INTRAVENOUS

## 2021-06-29 MED ORDER — PROPOFOL 10 MG/ML IV EMUL
10 mg/mL | INTRAVENOUS | Status: AC
Start: 2021-06-29 — End: ?

## 2021-06-29 MED ORDER — DEXMEDETOMIDINE 100 MCG/ML IV SOLN
100 mcg/mL | INTRAVENOUS | Status: DC | PRN
Start: 2021-06-29 — End: 2021-06-29
  Administered 2021-06-29 (×3): via INTRAVENOUS

## 2021-06-29 MED ORDER — SODIUM CHLORIDE 0.9 % IJ SYRG
INTRAMUSCULAR | Status: DC | PRN
Start: 2021-06-29 — End: 2021-06-29

## 2021-06-29 MED ORDER — BENZOCAINE 20 % MUCOSAL SPRAY
20 % | Status: AC
Start: 2021-06-29 — End: ?

## 2021-06-29 MED ORDER — GLYCOPYRROLATE 0.2 MG/ML IJ SOLN
0.2 mg/mL | INTRAMUSCULAR | Status: AC
Start: 2021-06-29 — End: ?

## 2021-06-29 MED ORDER — SODIUM CHLORIDE 0.9 % IV
INTRAVENOUS | Status: DC | PRN
Start: 2021-06-29 — End: 2021-06-29
  Administered 2021-06-29: 16:00:00 via INTRAVENOUS

## 2021-06-29 MED ORDER — SODIUM CHLORIDE 0.9 % IV
INTRAVENOUS | Status: DC
Start: 2021-06-29 — End: 2021-06-29
  Administered 2021-06-29: 15:00:00 via INTRAVENOUS

## 2021-06-29 MED ORDER — FAMOTIDINE 40 MG TAB
40 mg | ORAL_TABLET | Freq: Every day | ORAL | 3 refills | Status: DC
Start: 2021-06-29 — End: 2021-10-19

## 2021-06-29 MED ORDER — SODIUM CHLORIDE 0.9 % IV
INTRAVENOUS | Status: DC
Start: 2021-06-29 — End: 2021-06-29

## 2021-06-29 MED FILL — HURRICAINE ONE 20 % MUCOSAL SPRAY: 20 % | Qty: 1

## 2021-06-29 MED FILL — NORMAL SALINE FLUSH 0.9 % INJECTION SYRINGE: INTRAMUSCULAR | Qty: 40

## 2021-06-29 MED FILL — PROPOFOL 10 MG/ML IV EMUL: 10 mg/mL | INTRAVENOUS | Qty: 20

## 2021-06-29 MED FILL — SODIUM CHLORIDE 0.9 % IV: INTRAVENOUS | Qty: 1000

## 2021-06-29 MED FILL — GLYCOPYRROLATE 0.2 MG/ML IJ SOLN: 0.2 mg/mL | INTRAMUSCULAR | Qty: 1

## 2021-06-29 NOTE — Op Note (Signed)
EGD Procedure Note        Patient: Mallory Bonilla MRN: [id number]  SSN: [id number]    Date of Birth: [birth year]  Age: 55 y.o.  Sex: female        Date/Time:  06/29/2021 12:21 PM         IMPRESSION:       1. Antral gastritis  2. Distal esophagitis (grade 2)       RECOMMENDATIONS:    1. Check biopsy results.  2. We will give patient a trial of famotidine 40 mg daily.  3. Repeat esophageal dilatation as needed.    Procedure: Esophagogastroduodenoscopy with cold biopsies    Indication: Dysphagia    Endoscopist:  Francesca Jewett, MD    Referring Provider:   Sharlette Dense, NP    History: The history and physical exam were reviewed and updated.     Endoscope: GIF H180 Olympus video endoscope    Extent of Exam: Second part of the duodenum    ASA: Grade 2    Anethesia/Sedation:  TIVA    Description of the procedure:   The procedure was discussed with the patient including risks, benefits, alternatives including risks of iv sedation, bleeding, perforation and aspiration.  A safety timeout was performed. The patient was placed in the left lateral decubitus position.  A bite block was placed.  The patient was using standard protocol.  The patients vital signs were monitored at all times including heart rate/rhythm, blood pressure and oxygen saturation.  The endoscope was then passed under direct visualization to the second part of the duodenum.  The endoscope was then slowly withdrawn while visualizing the mucosa.  In the stomach a retroflexion was performed and gastric fundus and cardia visualized. The patient was then transferred to recovery in stable condition.                Findings:   Esophagus:The esophageal mucosa was inflamed in the distal esophagus consistent with a grade 2 esophagitis.  Early stricturing was noted in this region.  This was the area that was dilated..  Stomach: The gastric mucosa was inflamed in the gastric antrum.  Multiple biopsies taken there..   Duodenum: The duodenum mucosa was normal with  no ulceration, mass, stricture and no evidence of villous atrophy.     Therapies: Esophageal dilatation with use of the 50 French Maloney bougie.  After the dilator was well lubricated it was inserted into the oropharynx and taken down the esophagus time 4 passes and then removed.  The patient tolerated the procedure well.    Specimens:   ID Type Source Tests Collected by Time Destination   1 :  Preservative Stomach, Antrum  Francesca Jewett, MD 06/29/2021 1212 Pathology     Assistants: Circ.- Heese, Robin  Scrub Tech.- Oretha Ellis           EBL: Minimal    Complications:   None; patient tolerated the procedure well.     Implants: None    Discharge disposition:  Out of the recovery area when discharge criteria met         Francesca Jewett, MD  June 29, 2021  12:21 PM

## 2021-06-29 NOTE — Progress Notes (Signed)
Tell patient that the biopsies taken and her stomach were benign.  No infection was noted in the stomach.  Continue the pantoprazole 40 mg daily.  Repeat  esophageal dilatation as needed.

## 2021-06-29 NOTE — Anesthesia Post-Procedure Evaluation (Signed)
Procedure(s):  EGD (T I V A).    total IV anesthesia    Anesthesia Post Evaluation      Multimodal analgesia: multimodal analgesia not used between 6 hours prior to anesthesia start to PACU discharge  Patient location during evaluation: PACU  Patient participation: complete - patient participated  Pain management: adequate  Airway patency: patent  Anesthetic complications: no  Cardiovascular status: acceptable and stable  Respiratory status: acceptable  Hydration status: acceptable  Post anesthesia nausea and vomiting:  none  Final Post Anesthesia Temperature Assessment:  Normothermia (36.0-37.5 degrees C)      INITIAL Post-op Vital signs: No vitals data found for the desired time range.

## 2021-06-29 NOTE — Anesthesia Pre-Procedure Evaluation (Signed)
Relevant Problems   No relevant active problems       Anesthetic History   No history of anesthetic complications  Other anesthesia complications          Review of Systems / Medical History  Patient summary reviewed, nursing notes reviewed and pertinent labs reviewed    Pulmonary  Within defined limits                 Neuro/Psych         Psychiatric history     Cardiovascular    Hypertension                   GI/Hepatic/Renal  Within defined limits              Endo/Other    Diabetes    Arthritis     Other Findings              Physical Exam    Airway  Mallampati: II  TM Distance: 4 - 6 cm  Neck ROM: normal range of motion        Cardiovascular    Rhythm: regular  Rate: normal         Dental  No notable dental hx       Pulmonary  Breath sounds clear to auscultation               Abdominal  Abdominal exam normal       Other Findings            Anesthetic Plan    ASA: 2  Anesthesia type: total IV anesthesia            Anesthetic plan and risks discussed with: Patient

## 2021-06-29 NOTE — Progress Notes (Signed)
Dr. Gilliam in

## 2021-07-03 ENCOUNTER — Telehealth

## 2021-07-04 MED ORDER — PANTOPRAZOLE 40 MG TAB, DELAYED RELEASE
40 mg | ORAL_TABLET | Freq: Every day | ORAL | 3 refills | Status: DC
Start: 2021-07-04 — End: 2021-10-30

## 2021-07-04 NOTE — Telephone Encounter (Signed)
Returned patient's call and she states she read up on the Pepcid and the side effects is depression and anxiety and she already have these issues and don't want it to get worse. States she is still having the hoarseness and hiccups as well. Wants to know if this medication can be changed to something else.

## 2021-07-04 NOTE — Addendum Note (Signed)
Addendum Note by Dannette Barbara, MD at 07/04/21 1339                Author: Dannette Barbara, MD  Service: --  Author Type: Physician       Filed: 07/04/21 1339  Encounter Date: 07/03/2021  Status: Signed          Editor: Dannette Barbara, MD (Physician)          Addended by: Dannette Barbara on: 07/04/2021 01:39 PM    Modules accepted: Orders

## 2021-07-04 NOTE — Telephone Encounter (Signed)
Patient notified of med change.

## 2021-07-07 ENCOUNTER — Encounter

## 2021-07-09 MED ORDER — TRAZODONE 100 MG TAB
100 mg | ORAL_TABLET | ORAL | 0 refills | Status: DC
Start: 2021-07-09 — End: 2021-09-13

## 2021-07-30 ENCOUNTER — Encounter

## 2021-07-30 MED ORDER — BUSPIRONE 30 MG TAB
30 mg | ORAL_TABLET | ORAL | 0 refills | Status: AC
Start: 2021-07-30 — End: ?

## 2021-08-05 DIAGNOSIS — F331 Major depressive disorder, recurrent, moderate: Secondary | ICD-10-CM

## 2021-08-05 NOTE — ED Notes (Signed)
 Pt stating I am not suicidal anymore and I am feeling a lot better. I would just rather go home. My ride can come get me.    Dr victory made aware.

## 2021-08-05 NOTE — ED Provider Notes (Signed)
Patient presents with complaint of being suicidal , after breaking up with partner earlier. She does not have a plan.        Past Medical History:   Diagnosis Date    Acid reflux 06/13/2021    Agoraphobia     Anxiety 01/11/2021    Anxiety     Arthritis     Bipolar 1 disorder (HCC) 01/11/2021    Diabetes (HCC)     Diabetes mellitus type 2, controlled (HCC) 01/11/2021    Headache     Headache 01/11/2021    Hypercholesterolemia     Hyperlipidemia 01/11/2021    Hypertension     Hypertension 01/11/2021    Throat pain        Past Surgical History:   Procedure Laterality Date    COLONOSCOPY  05/30/2017    HX GYN      uterine ablasion    HX OOPHORECTOMY      HX ORTHOPAEDIC      foot surgery         Family History:   Problem Relation Age of Onset    Diabetes Mother     Cancer Mother     Diabetes Sister     Heart Disease Sister     Cancer Sister     Diabetes Brother     Heart Disease Brother     Liver Disease Other     Macular Degen Other     Emphysema Father        Social History     Socioeconomic History    Marital status: DIVORCED     Spouse name: Not on file    Number of children: Not on file    Years of education: Not on file    Highest education level: Not on file   Occupational History    Not on file   Tobacco Use    Smoking status: Never    Smokeless tobacco: Never   Vaping Use    Vaping Use: Never used   Substance and Sexual Activity    Alcohol use: Not Currently    Drug use: Not Currently    Sexual activity: Yes   Other Topics Concern    Military Service No    Blood Transfusions No    Caffeine Concern No    Occupational Exposure No    Hobby Hazards No    Sleep Concern No    Stress Concern No    Weight Concern No    Special Diet No    Back Care No    Exercise No    Bike Helmet No    Seat Belt No    Self-Exams No   Social History Narrative    Not on file     Social Determinants of Health     Financial Resource Strain: Not on file   Food Insecurity: Not on file   Transportation Needs: Not on file   Physical Activity: Not on  file   Stress: Not on file   Social Connections: Not on file   Intimate Partner Violence: Not on file   Housing Stability: Not on file         ALLERGIES: Patient has no known allergies.    Review of Systems   Constitutional: Negative.    HENT: Negative.     Eyes: Negative.    Respiratory: Negative.     Cardiovascular: Negative.    Gastrointestinal: Negative.    Endocrine: Negative.    Genitourinary: Negative.  Skin: Negative.    Allergic/Immunologic: Negative.    Neurological: Negative.    Hematological: Negative.    Psychiatric/Behavioral:  Positive for suicidal ideas.    All other systems reviewed and are negative.    Vitals:    08/05/21 2308   BP: 135/74   Pulse: 86   Resp: 15   Temp: 97.8 ??F (36.6 ??C)   SpO2: 97%            Physical Exam  Vitals and nursing note reviewed.   Constitutional:       Appearance: She is well-developed.   HENT:      Head: Normocephalic and atraumatic.   Cardiovascular:      Rate and Rhythm: Normal rate and regular rhythm.      Heart sounds: Normal heart sounds.   Pulmonary:      Breath sounds: Normal breath sounds.   Abdominal:      General: Bowel sounds are normal.      Palpations: Abdomen is soft.   Musculoskeletal:         General: Normal range of motion.      Cervical back: Normal range of motion and neck supple.   Neurological:      General: No focal deficit present.      Mental Status: She is alert.   Psychiatric:         Mood and Affect: Mood is depressed. Affect is flat.         Behavior: Behavior normal.         Thought Content: Thought content includes suicidal ideation. Thought content does not include homicidal or suicidal plan.        MDM         Procedures

## 2021-08-06 ENCOUNTER — Inpatient Hospital Stay
Admit: 2021-08-06 | Discharge: 2021-08-06 | Disposition: A | Discharge status: Home / Self Care | Payer: MEDICARE | Attending: Emergency Medicine

## 2021-08-06 MED ORDER — LORAZEPAM 1 MG TAB
1 mg | ORAL | Status: AC
Start: 2021-08-06 — End: 2021-08-06
  Administered 2021-08-06: 04:00:00 via ORAL

## 2021-08-06 MED FILL — LORAZEPAM 1 MG TAB: 1 mg | ORAL | Qty: 1

## 2021-08-06 NOTE — ED Notes (Signed)
Per dr Sherilyn Cooter, pt is okay to be discharged and [facility] consult cancelled.

## 2021-08-13 ENCOUNTER — Encounter: Payer: MEDICARE | Attending: Registered Nurse | Primary: Family

## 2021-08-22 ENCOUNTER — Encounter: Attending: Orthopaedic Surgery | Primary: Family

## 2021-09-07 ENCOUNTER — Other Ambulatory Visit: Payer: Self-pay | Admitting: Obstetrics & Gynecology

## 2021-09-12 ENCOUNTER — Encounter

## 2021-09-12 ENCOUNTER — Encounter: Admit: 2021-09-12 | Discharge: 2021-09-12 | Payer: MEDICARE | Attending: Orthopaedic Surgery | Primary: Family

## 2021-09-12 ENCOUNTER — Ambulatory Visit: Attending: Orthopaedic Surgery | Primary: Family

## 2021-09-12 DIAGNOSIS — M25512 Pain in left shoulder: Secondary | ICD-10-CM

## 2021-09-12 MED ORDER — LIDOCAINE HCL 1 % (10 MG/ML) IJ SOLN
10 mg/mL (1 %) | Freq: Once | INTRAMUSCULAR | Status: AC
Start: 2021-09-12 — End: 2021-09-12
  Administered 2021-09-12: 18:00:00

## 2021-09-12 MED ORDER — TRIAMCINOLONE ACETONIDE 40 MG/ML SUSP FOR INJECTION
40 mg/mL | Freq: Once | INTRAMUSCULAR | Status: AC
Start: 2021-09-12 — End: 2021-09-12
  Administered 2021-09-12: 18:00:00 via INTRA_ARTICULAR

## 2021-09-12 NOTE — Progress Notes (Signed)
Name: Mallory Bonilla    DOB: [birth year]     Service Dept: [facility] - [facility]    Chief Complaint   Patient presents with    Shoulder Pain        Visit Vitals  Ht 5\' 5"  (1.651 m)   Wt 188 lb (85.3 kg)   BMI 31.28 kg/m??        No Known Allergies     Current Outpatient Medications   Medication Sig Dispense Refill    ALPRAZolam (XANAX) 0.25 mg tablet TAKE 1 TO 2 TABLETS BY MOUTH ONCE DAILY AS NEEDED FOR SEVERE ANXIETY DURING TRIP      amoxicillin 500 mg tab TAKE 2 TABLETS BY MOUTH NOW THEN 1 TABLET 4 TIMES DAILY UNTIL GONE      ARIPiprazole (ABILIFY) 5 mg tablet Take 5 mg by mouth daily.      True Metrix Glucose Test Strip strip USE 1 STRIP TO CHECK GLUCOSE TWICE DAILY      diazePAM (VALIUM) 2 mg tablet TAKE 1 TABLET BY MOUTH ONCE DAILY . MAX DAILY AMOUNT OF 2MG       eszopiclone (LUNESTA) 3 mg tablet       predniSONE (DELTASONE) 20 mg tablet TAKE 2 TABLETS BY MOUTH ONCE DAILY FOR 3 DAYS THEN 1 ONCE DAILY FOR 3 DAYS      vilazodone (VIIBRYD) 20 mg tab tablet Take  by mouth daily.      clonazePAM (KlonoPIN) 1 mg tablet Take 1 mg by mouth two (2) times a day.      lurasidone (Latuda) 20 mg tab tablet Take 20 mg by mouth.      rOPINIRole (REQUIP) 0.25 mg tablet Take 0.25 mg by mouth three (3) times daily.      rizatriptan (MAXALT) 5 mg tablet Take 5 mg by mouth once as needed for Migraine. May repeat in 2 hours if needed      busPIRone (BUSPAR) 30 mg tablet TAKE 1 TABLET BY MOUTH TWICE DAILY FOR ANXIETY 180 Tablet 0    traZODone (DESYREL) 100 mg tablet TAKE 2 TABLETS BY MOUTH NIGHTLY AS NEEDED FOR SLEEP 90 Tablet 0    pantoprazole (PROTONIX) 40 mg tablet Take 1 Tablet by mouth daily. Indications: gastroesophageal reflux disease 30 Tablet 3    famotidine (PEPCID) 40 mg tablet Take 1 Tablet by mouth daily. (Patient not taking: Reported on 08/05/2021) 30 Tablet 3    metFORMIN (GLUCOPHAGE) 500 mg tablet Take 500 mg by mouth two (2) times a day.      lisinopriL (PRINIVIL, ZESTRIL) 5 mg tablet Take 5  mg by mouth daily.      dulaglutide (Trulicity) 0.75 mg/0.5 mL sub-q pen 0.75 mg by SubCUTAneous route every seven (7) days. monday      Emgality Pen 120 mg/mL injection 120 mg by SubCUTAneous route every thirty (30) days.      topiramate (Topamax) 50 mg tablet Take 1 Tablet by mouth nightly. Indications: migraine prevention 30 Tablet 0    hydroCHLOROthiazide (HYDRODIURIL) 25 mg tablet Take 1 Tablet by mouth daily. Indications: high blood pressure 30 Tablet 0    pravastatin (PRAVACHOL) 40 mg tablet Take 40 mg by mouth nightly.      estradioL (ESTRACE) 2 mg tablet Take  by mouth daily.      aspirin delayed-release 81 mg tablet Take 81 mg by mouth daily.       Current Facility-Administered Medications   Medication Dose Route Frequency Provider Last Rate Last Admin  lidocaine (XYLOCAINE) 10 mg/mL (1 %) injection 9 mL  9 mL Other ONCE Icholas Irby A, MD        triamcinolone acetonide (KENALOG-40) 40 mg/mL injection 40 mg  40 mg Intra artICUlar ONCE Zona Pedro A, MD        lidocaine (XYLOCAINE) 10 mg/mL (1 %) injection 9 mL  9 mL Other ONCE Kase Shughart A, MD        triamcinolone acetonide (KENALOG-40) 40 mg/mL injection 40 mg  40 mg Intra artICUlar ONCE Eulis Canner, MD          Patient Active Problem List   Diagnosis Code    Headache R51.9    Hypertension I10    Diabetes mellitus type 2, controlled (HCC) E11.9    Anxiety F41.9    Hyperlipidemia E78.5    Bipolar 1 disorder (HCC) F31.9    MDD (major depressive disorder) F32.9    Agoraphobia F40.00    Acid reflux K21.9      Family History   Problem Relation Age of Onset    Diabetes Mother     Cancer Mother     Diabetes Sister     Heart Disease Sister     Cancer Sister     Diabetes Brother     Heart Disease Brother     Liver Disease Other     Macular Degen Other     Emphysema Father       Social History     Socioeconomic History    Marital status: DIVORCED   Tobacco Use    Smoking status: Never    Smokeless tobacco: Never   Vaping Use    Vaping Use: Never used    Substance and Sexual Activity    Alcohol use: Not Currently    Drug use: Not Currently    Sexual activity: Yes   Other Topics Concern    Military Service No    Blood Transfusions No    Caffeine Concern No    Occupational Exposure No    Hobby Hazards No    Sleep Concern No    Stress Concern No    Weight Concern No    Special Diet No    Back Care No    Exercise No    Bike Helmet No    Seat Belt No    Self-Exams No      Past Surgical History:   Procedure Laterality Date    COLONOSCOPY  05/30/2017    HX GYN      uterine ablasion    HX OOPHORECTOMY      HX ORTHOPAEDIC      foot surgery      Past Medical History:   Diagnosis Date    Acid reflux 06/13/2021    Agoraphobia     Anxiety 01/11/2021    Anxiety     Arthritis     Bipolar 1 disorder (HCC) 01/11/2021    Diabetes (HCC)     Diabetes mellitus type 2, controlled (HCC) 01/11/2021    Headache     Headache 01/11/2021    Hypercholesterolemia     Hyperlipidemia 01/11/2021    Hypertension     Hypertension 01/11/2021    Throat pain         I have reviewed and agree with PFSH and ROS and intake form in chart and the record furthermore I have reviewed prior medical record(s) regarding this patients care during this appointment.     Review of Systems:  Patient is a pleasant appearing individual, appropriately dressed, well hydrated, well nourished, who is alert, appropriately oriented for age, and in no acute distress with a normal gait and normal affect who does not appear to be in any significant pain.     Physical Exam:  Left Shoulder is grossly neurovascularly intact. Range of motion-Decreases passive and active with impingement in all Planes, Positive inferior glide test. Mild Weakness with abduction, Some mild crepitation, No Point Tenderness , No skin lesion are identified, No instabilty is noted, No apprehension. No cuts or abrasions are identified.     Right Shoulder is grossly neurovascularly intact. Full Range of motion, No Weakness with abduction, No Point Tenderness, No  skin lesion are identified, No instabilty is noted, No apprehension. No cuts or abrasions are identified.     Procedure Documentation: 2 injections Left Frozen Shoulder    I discussed in detail the risks, benefits and complications of an injection which included but are not limited to infection, skin reactions, hot swollen joint, and anaphylaxis with the patient. The patient verbalized understanding and gave informed consent for the injection. The patient's left shoulder were prepped using sterile alcohol solution. A sterile needle was inserted into the left shoulder and the mixture of 9 mL Lidocaine 1%, 1 mL Kenalog 40 mg was injected under sterile technique. The needle was withdrawn and the puncture site sealed with a Band-Aid.      Technique: Under sterile conditions a GE ultrasound unit with a variable frequency (7.0-14.0 MHz) linear transducer was used to localize the placement of needle into the left joint.    Findings: Successful needle placement for shoulder injection.  Final images were taken and saved for permanent record.      The patient tolerated the injection well. The patient was instructed to call the office immediately if there is any pain, redness, warmth, fever, or chills.    Encounter Diagnoses     ICD-10-CM ICD-9-CM   1. Left shoulder pain, unspecified chronicity  M25.512 719.41   2. Bursitis of left shoulder  M75.52 726.10   3. Adhesive capsulitis of left shoulder  M75.02 726.0       HPI:  The patient is here with a chief complaint of left shoulder pain, throbbing, burning pain.  It has been the same.  Pain is 8/10.    X-rays of the left shoulder are unremarkable.    Assessment/Plan:  1.  Left shoulder frozen shoulder.    Plan is for cortisone injection both in glenohumeral joint and subacromial space.  We did try to keep an eye on her sugars.  We will put her on formal therapy and go from there.    As part of continued conservative pain management options the patient was advised to utilize  Tylenol or OTC NSAIDS as long as it is not medically contraindicated.     Return to Office:   Follow-up and Dispositions    Return in about 6 weeks (around 10/24/2021).           Scribed by Sherian Rein as dictated by [facility] A. Allena Katz, MD.  Documentation True and Accepted Kyren Knick A. Allena Katz, MD

## 2021-09-13 ENCOUNTER — Encounter

## 2021-09-13 ENCOUNTER — Inpatient Hospital Stay: Admit: 2021-09-13 | Payer: MEDICARE | Primary: Family

## 2021-09-13 MED ORDER — TRAZODONE 100 MG TAB
100 mg | ORAL_TABLET | ORAL | 0 refills | Status: AC
Start: 2021-09-13 — End: ?

## 2021-09-13 NOTE — Other (Signed)
Therapy Evaluation by Jackelyn Poling, PT at 09/13/21 0900                Author: Jackelyn Poling, PT  Service: Physical Therapy  Author Type: Physical Therapist       Filed: 09/13/21 1007  Date of Service: 09/13/21 0900  Status: Signed          Editor: Jackelyn Poling, PT (Physical Therapist)               PT INITIAL EVALUATION NOTE - MCR 2-15      Patient Name: Mallory Bonilla   Date:09/13/2021   DOB: 08-24-1966   [x]   Patient DOB Verified   [place]or: [place] MED[place] / Plan: [place] MEDICARE PART A & B / Product Type: Medicare /     In time: 0910  Out time: 1000   Total Treatment Time (min): 50   Total Timed Codes (min): 50   1:1 Treatment Time (MC only): 50    Visit #: 1      Treatment Area: Left shoulder pain, unspecified chronicity [M25.512]   Bursitis of left shoulder [M75.52]   Adhesive capsulitis of left shoulder [M75.02]      SUBJECTIVE   Pain Level (0-10 scale): 4/10   Any medication changes, allergies to medications, adverse drug reactions, diagnosis change, or new procedure performed?: []   No    [x]  Yes (see summary sheet for update)   Subjective:     Pt is 55 year old female who presents to physical therapy with complaints of left shoulder pain.  Pt reports 3 months ago she went to MD who said she had bursitis and gave her a round of prednisone which only helped a little bit, followed by a 2nd round  that still didn't relieve symptoms.  Pt was then referred to orthopedic, who she saw yesterday.  Pt reports he gave her a prednisone shot and told her it was a frozen shoulder.  Pt is right handed.  Pt with no history of left shoulder issues. Pt reports  pain gets worse at night   PLOF: Independent with all ADL's; no use of AD   Mechanism of Injury: insidious   Previous Treatment/Compliance: prednisone and shots   Radiographs: xray was negative; no MRI    What increases symptoms: at night, reaching, stretching, lifting   What decreases symptoms: rest, ice   Work Hx: does not work   :  lives with friend and daughter; pt I with all ADLS   Pt Goals: less pain and relief from stiffness   Barriers: rounded shoulders; immobility    Motivation: Good   Substance use: None reported   Cognition: A&O x 4   Fall Assessment: No falls risk assessment indicated at this time.   Past Medical History:     Past Medical History:        Diagnosis  Date         ?  Acid reflux  06/13/2021     ?  Agoraphobia       ?  Anxiety  01/11/2021     ?  Anxiety       ?  Arthritis           ?  Bipolar 1 disorder (HCC)  01/11/2021         ?  Diabetes (HCC)       ?  Diabetes mellitus type 2, controlled (HCC)  01/11/2021     ?  Headache       ?  Headache  01/11/2021     ?  Hypercholesterolemia       ?  Hyperlipidemia  01/11/2021     ?  Hypertension       ?  Hypertension  01/11/2021         ?  Throat pain          Past Surgical History:     Past Surgical History:         Procedure  Laterality  Date          ?  COLONOSCOPY    05/30/2017     ?  HX GYN              uterine ablasion          ?  HX OOPHORECTOMY         ?  HX ORTHOPAEDIC              foot surgery        Current Medications:     Current Outpatient Medications        Medication  Instructions         ?  ALPRAZolam (XANAX) 0.25 mg tablet  TAKE 1 TO 2 TABLETS BY MOUTH ONCE DAILY AS NEEDED FOR SEVERE ANXIETY DURING TRIP     ?  amoxicillin 500 mg tab  TAKE 2 TABLETS BY MOUTH NOW THEN 1 TABLET 4 TIMES DAILY UNTIL GONE     ?  ARIPiprazole (ABILIFY)  5 mg, Oral, DAILY     ?  aspirin delayed-release  81 mg, Oral, DAILY     ?  busPIRone (BUSPAR) 30 mg tablet  TAKE 1 TABLET BY MOUTH TWICE DAILY FOR ANXIETY     ?  clonazePAM (KLONOPIN)  1 mg, Oral, 2 TIMES DAILY     ?  diazePAM (VALIUM) 2 mg tablet  TAKE 1 TABLET BY MOUTH ONCE DAILY . MAX DAILY AMOUNT OF 2MG      ?  Emgality Pen  120 mg, SubCUTAneous, EVERY 30 DAYS     ?  estradioL (ESTRACE) 2 mg tablet  Oral, DAILY     ?  eszopiclone (LUNESTA) 3 mg tablet  No dose, route, or frequency recorded.     ?  famotidine (PEPCID)  40 mg, Oral, DAILY      ?  hydroCHLOROthiazide (HYDRODIURIL)  25 mg, Oral, DAILY     ?  lisinopriL (PRINIVIL, ZESTRIL)  5 mg, Oral, DAILY     ?  lurasidone (LATUDA)  20 mg, Oral         ?  metFORMIN (GLUCOPHAGE)  500 mg, Oral, 2 TIMES DAILY         ?  pantoprazole (PROTONIX)  40 mg, Oral, DAILY     ?  pravastatin (PRAVACHOL)  40 mg, Oral, EVERY BEDTIME     ?  predniSONE (DELTASONE) 20 mg tablet  TAKE 2 TABLETS BY MOUTH ONCE DAILY FOR 3 DAYS THEN 1 ONCE DAILY FOR 3 DAYS     ?  rizatriptan (MAXALT)  5 mg, Oral, ONCE PRN, May repeat in 2 hours if needed     ?  rOPINIRole (REQUIP)  0.25 mg, Oral, 3 TIMES DAILY     ?  topiramate (TOPAMAX)  50 mg, Oral, EVERY BEDTIME     ?  traZODone (DESYREL) 100 mg tablet  TAKE 2 TABLETS BY MOUTH NIGHTLY AS NEEDED FOR SLEEP     ?  True Metrix Glucose Test Strip strip  USE 1 STRIP TO  CHECK GLUCOSE TWICE DAILY     ?  Trulicity  0.75 mg, SubCUTAneous, EVERY 7 DAYS, monday          ?  vilazodone (VIIBRYD) 20 mg tab tablet  Oral, DAILY              OBJECTIVE/EXAMINATION   Posture:  rounded shoulders, forward head   Palpation: mildly ttp along acromial process. Tightness along UT. No ttp at specific surrounding shoulder musclature.             Shoulder:    Strength  AROM  PROM                  Right  Left  Right  Left  Right  Left       Flexion  5/5  5/5 within available range  130  104    115       Extension  5/5  4/5  45  31           Abduction  5/5  4/5  116  64    71       Adduction                   IR  5/5  5/5  64  26    31       ER  5/5  4/5  45  44               Elbow:    Strength  AROM  PROM                  Right  Left  Right  Left  Right  Left       Flexion  5/5  4+/5  wfl  wfl                    Extension  5/5  5/5  wfl  wfl         *All strength measures are on a scale with 5 as a maximum, if a space is left blank it was not tested.   All AROM measurements taken with patient in standing position.   All PROM measurements taken with patient in supine position.          Joint Mobility Assessment:  Glenohumeral: good ant/post; fair inferior       Acromioclavicular: wfl           Neurological: Reflexes / Sensations: wfl   Special Tests: Neer Impingement: +  Hawkins-Kennedy: +       Crank: -    Load and Shift: -      O'Brien: -    Apprehension: -             10  min  Therapeutic Exercise:  [x]   See flow sheet :     Rationale: increase ROM, increase strength, and improve coordination to  improve the patients ability to reach overhead with less difficulty         With    [x]   TE    []  TA    []  neuro    []  other:  Patient Education: [x]   Provided HEP     []  Progressed/Changed HEP based on:    []  positioning   []  body mechanics   []   transfers   []  heat/ice application     []  other:  Pain Level (0-10 scale) post treatment: 4/10      ASSESSMENT/Changes in Function:    [x]   See Plan of Care         , PT, DPT 09/13/2021

## 2021-09-13 NOTE — Other (Signed)
Therapy Evaluation by Jackelyn Poling, PT at 09/13/21 0900                Author: Jackelyn Poling, PT  Service: Physical Therapy  Author Type: Physical Therapist       Filed: 09/13/21 1009  Date of Service: 09/13/21 0900  Status: Signed           Editor: Jackelyn Poling, PT (Physical Therapist)  Cosigner: Eulis Canner, MD at 09/13/21 1354                     [facility]   [address]   [place], [place] [address]   Ph: [phone]    Fax: [phone]      Plan of Care/Statement of Necessity for Physical Therapy Services  2-15      Patient name: Mallory Bonilla  DOB : May 22, 1966  Provider#: [id number]   Referral source: Eulis Canner, MD       Medical/Treatment Diagnosis: Left shoulder pain, unspecified chronicity [M25.512]   Bursitis of left shoulder [M75.52]   Adhesive capsulitis of left shoulder [M75.02]      Prior Hospitalization: see medical history      Comorbidities: see medical history   Prior Level of Function: Independent with all ADL's; no use of AD   Medications: Verified on Patient Summary List   Start of Care: 09/13/2021      Onset Date : June 2022    The Plan of Care and following information is based on the information from the initial evaluation.      Assessment/ key information:    Patient presents to physical therapy with left shoulder pain, decreased ROM, postural instability and upper extremity weakness limiting functional activities.  The patient would benefit from physical therapy to utilize modalities to decrease pain, increase  ROM, improve scapular stabilization and strength to maximize function.  Pt will receive treatment including upper extremity flexibility, glenohumeral and scapulothoracic stabilization ROM, strengthening, modalities for pain control, and functional activities.   Patient's symptoms are consistent with likely shoulder impingement due to postural instabilities and positive special tests.  Patient was instructed in HEP  with 1:1 supervision and given pictures to facilitate compliance.  Will monitor pt for progress  with conservative treatment for shoulder impingement, and will reassess for possible RTC or adhesive capsulitis pathology if no improvement is made.  Patient would benefit from skilled physical therapy to progress toward goals and maximize function.   Thank you for this referral.          Evaluation Complexity History LOW Complexity : Zero comorbidities / personal factors  that will impact the outcome / POC; Examination LOW Complexity : 1-2 Standardized tests and measures addressing body structure, function, activity  limitation and / or participation in recreation  ;Presentation LOW Complexity : Stable, uncomplicated  ; Clinical Decision Making TUG Score: n/a   Overall Complexity Rating: LOW       Problem List: pain affecting function, decrease ROM, decrease strength, decrease ADL/ functional abilitiies, and decrease activity tolerance    Treatment Plan may include any combination of the following: Therapeutic exercise, Therapeutic activities, Neuromuscular re-education, Physical  agent/modality, Manual therapy, Patient education, Self Care training, and Functional mobility training   Patient / Family readiness to learn indicated by: asking questions, trying to perform skills, and interest   Persons(s) to be included in education: patient (P)   Barriers to Learning/Limitations: None   Patient Goal (s): to  improve strength   Patient Self Reported Health Status: good   Rehabilitation Potential: good      Short Term Goals: To be accomplished in 5 treatments.   1.  Patient will demonstrate independence and compliance with HEP in order to assist with carryover from PT services.   2.   Patient will be able to wash hair without pain greater than or equal to 6/10 to increase functional mobility.   3.   Patient will be able to donn/doff clothes without pain greater than or equal to 6/10 to increase functional mobility.   4.    Patient will be able to reach overhead without pain greater than or equal to 7/10 to increase functional mobility.   5.   Patient will increase   to left shoulder arom flexion in order to 120 degrees.   6.   Patient will increase left shoulder arom abduction to 80 in order to reach overhead with less difficulty.         Long Term Goals: To be accomplished in 10 treatments.   1.   Patient will be able to get objects out of dryer without pain greater than or equal to 3/10 to increase functional mobility.   2.   Patient will be able to mop/sweep at home without pain greater than or equal to 3/10 to increase functional mobility.   7.   Patient will increase left shoulder frlexion strength to 5/5 in order to reach overhead and return to work.   7.   Patient will increase left shoulder arom IR to 50 in order to fasten clothes behind her back.      Frequency / Duration: Patient to be seen 2 times per week for 10 treatments.      Patient/ Caregiver education and instruction: exercises      [x]   Plan of care has been reviewed with PTA            Certification Period: 09/13/2021 to 12/10/2021      14/11/2021, PT, DPT  09/13/2021       ________________________________________________________________________      I certify that the above Therapy Services are being furnished while the patient is under my care. I agree with the treatment plan and certify that this therapy is necessary.      Physician's Signature:____________________  Date:____________Time: _________                                        09/15/2021, MD   Please sign and return to:   [facility]  [address]        [place],  [place]  Ph: [phone]    Fax: [phone]      Patient name: Mallory Bonilla  DOB: 03-03-1966   Provider#: [id number]

## 2021-09-18 ENCOUNTER — Inpatient Hospital Stay: Admit: 2021-09-18 | Payer: MEDICARE | Primary: Family

## 2021-09-18 NOTE — Progress Notes (Signed)
 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    OBJECTIVE    Modality rationale: decrease edema, decrease inflammation, decrease pain, increase tissue extensibility, and increase muscle contraction/control to improve the patient's ability to improve function   Min Type Additional Details      10 [x]  Estim: [] Att   [] Unatt    [] TENS instruct                  [x] IFC  [] Premod   [] NMES                    [] Other:  [] w/US       [] w/ heat  [x] w/ ice and vaso compression  Position: seated  Location: left shoulder       []   Traction: []  Cervical       [] Lumbar                       []  Prone          [] Supine                       [] Intermittent   [] Continuous Lbs:  []  before manual  []  after manual  []  w/ heat  []  Simultaneously performed with w/ Estim    []   Ultrasound: [] Continuous   []  Pulsed                       at: []   [] Location:  W/cm2:    []  Paraffin         Location:   [] w/heat    []   Ice     []   Heat  []   Ice massage Position:  Location:    []   Laser  []   Other: Position:  Location:      []   Vasopneumatic Device Pressure:       []  lo []  med []  hi   []  w/ ice      Temperature:   []  Simultaneously performed with w/ Estim     [x]  Skin assessment post-treatment:   [x] intact [x] redness- no adverse reaction     [] redness - adverse reaction:     45 min Group Therapy:  [x]  See flow sheet :   Rationale: increase ROM, increase strength, improve coordination, improve balance, and increase proprioception to improve the patient's ability to complete adls with less pain.  Billed while completing therex.     With   [x]  TE   []  TA   []  neuro   []  other: Patient Education: [x]  Review HEP    []  Progressed/Changed  HEP based on:   []  positioning   []  body mechanics   []  transfers   []  heat/ice application    []  other:      Other Objective/Functional Measures: addition of further rom and strengthening exercises     Pain Level (0-10 scale) post treatment: 0/10    ASSESSMENT/Changes in Function:   The pt tolerated treatment well today.  Addition of pulleys and fingerladder to included aarom and strengthening activities.  Pt tolerated all increases well.  Increased cues required for correct technique.   Patient will continue to benefit from skilled PT services to modify and progress therapeutic interventions, address functional mobility deficits, address ROM deficits, address strength deficits, analyze and address soft tissue restrictions, and analyze and cue movement patterns to attain remaining goals.     [x]   See Plan of Care  []   See progress note/recertification  []   See Discharge Summary         Progress towards goals / Updated goals:  Short Term Goals: To be accomplished in 5 treatments.  Patient will demonstrate independence and compliance with HEP in order to assist with carryover from PT services.  2.   Patient will be able to wash hair without pain greater than or equal to 6/10 to increase functional mobility.  3.   Patient will be able to donn/doff clothes without pain greater than or equal to 6/10 to increase functional mobility.  4.   Patient will be able to reach overhead without pain greater than or equal to 7/10 to increase functional mobility.  5.   Patient will increase   to  left shoulder arom flexion in order to 120 degrees.  6.   Patient will increase left shoulder arom abduction to 80 in order to reach overhead with less difficulty.        Long Term Goals: To be accomplished in 10 treatments.  1.   Patient will be able to get objects out of dryer without pain greater than or equal to 3/10 to increase functional mobility.  2.   Patient will be able to mop/sweep at home without pain greater than or equal to 3/10 to increase functional mobility.  7.   Patient will increase left shoulder frlexion strength to 5/5 in order to reach overhead and return to work.  7.   Patient will increase left shoulder arom IR to 50 in order to fasten clothes behind her back.    PLAN  [x]   Upgrade activities as tolerated     [x]   Continue plan of care  []   Update interventions per flow sheet       []   Discharge due to:_  []   Other:_      Lauraine Joshua Knee, PT, DPT 09/18/2021

## 2021-09-20 ENCOUNTER — Inpatient Hospital Stay: Admit: 2021-09-20 | Payer: MEDICARE | Primary: Family

## 2021-09-20 NOTE — Progress Notes (Signed)
 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    OBJECTIVE    Modality rationale: decrease edema, decrease inflammation, and decrease pain to improve the patient's ability to perform tasks with less tissue irritation.    Min Type Additional Details       []  Estim: [] Att   [] Unatt    [] TENS instruct                  [] IFC  [] Premod   [] NMES                    [] Other:  [] w/US       [] w/ heat  [] w/ ice  Position:  Location:       []   Traction: []  Cervical       [] Lumbar                       []  Prone          [] Supine                       [] Intermittent   [] Continuous Lbs:  []  before manual  []  after manual  []  w/ heat  []  Simultaneously performed with w/ Estim    []   Ultrasound: [] Continuous   []  Pulsed                       at: []   [] Location:  W/cm2:    []  Paraffin         Location:   [] w/heat    []   Ice     []   Heat  []   Ice massage Position:  Location:    []   Laser  []   Other: Position:  Location:     10 [x]   Vasopneumatic Device Pressure:       [x]  lo []  med []  hi   [x]  w/ ice      Temperature: 34 deg  [x]  Simultaneously performed with w/ Estim     [x]  Skin assessment post-treatment:  [x] intact [x] redness- no adverse reaction     [] redness - adverse reaction:      33 min Therapeutic Exercise:  [x]  See flow sheet :   Rationale: increase ROM, increase strength, and improve coordination to improve the patient's ability to perform reaching tasks.           12 min Group Therapy:  [x]  See flow sheet :   Billed while completing exercises while the therapist was assisting another patient.    With   [x]  TE   []  TA   []   neuro   []  other: Patient Education: [x]  Review HEP    []  Progressed/Changed HEP based on:   []  positioning   []  body mechanics   []  transfers   []  heat/ice application    []  other:      Other Objective/Functional Measures: wall angels and doorway stretch    Pain Level (0-10 scale) post treatment: 2/10    ASSESSMENT/Changes in Function:   The pt tolerated treatment well today. She did get a little soreness from the exercises during the session, but did find relief from ice and estim today. We will continue working on stretches and strengthening to gain ROM. Patient will continue to benefit from skilled PT services to modify and progress therapeutic interventions, address functional mobility deficits, address ROM deficits, address strength deficits, analyze and address soft tissue restrictions, and analyze and cue movement patterns to attain remaining goals.     [x]   See Plan of Care  []   See progress note/recertification  []   See Discharge Summary         Progress towards goals / Updated goals:  Short Term Goals: To be accomplished in 5 treatments.  Patient will demonstrate independence and compliance with HEP in order to assist with carryover from PT services.  2.   Patient will be able to wash hair without pain greater than or equal to 6/10 to increase functional mobility.  3.   Patient will be able to donn/doff clothes without pain greater than or equal to 6/10 to increase functional mobility.  4.   Patient will be able to reach overhead without pain greater than or equal to 7/10 to increase functional mobility.  5.   Patient will increase   to left shoulder arom  flexion in order to 120 degrees.  6.   Patient will increase left shoulder arom abduction to 80 in order to reach overhead with less difficulty.        Long Term Goals: To be accomplished in 10 treatments.  1.   Patient will be able to get objects out of dryer without pain greater than or equal to 3/10 to increase functional mobility.  2.   Patient will be able to mop/sweep at home without pain greater than or equal to 3/10 to increase functional mobility.  3.   Patient will increase left shoulder frlexion strength to 5/5 in order to reach overhead and return to work.  4.   Patient will increase left shoulder arom IR to 50 in order to fasten clothes behind her back.    PLAN  [x]   Upgrade activities as tolerated     [x]   Continue plan of care  []   Update interventions per flow sheet       []   Discharge due to:_  []   Other:_      Lacinda Rutter, PT, DPT 09/20/2021

## 2021-09-25 ENCOUNTER — Inpatient Hospital Stay: Admit: 2021-09-25 | Payer: MEDICARE | Primary: Family

## 2021-09-25 NOTE — Progress Notes (Signed)
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    Min Type Additional Details       []  Estim: [] Att   [] Unatt    [] TENS instruct                  [] IFC  [] Premod   [] NMES                    [] Other:  [] w/US      [] w/ heat  [] w/ ice  Position:  Location:       []   Traction: []  Cervical       [] Lumbar                       []  Prone          [] Supine                       [] Intermittent   [] Continuous Lbs:  []  before manual  []  after manual  []  w/ heat  []  Simultaneously performed with w/ Estim    []   Ultrasound: [] Continuous   []  Pulsed                       at: []   [] Location:  W/cm2:    []  Paraffin         Location:   [] w/heat    []   Ice     []   Heat  []   Ice massage Position:  Location:    []   Laser  []   Other: Position:  Location:   10   [x]   Vasopneumatic Device Pressure:       [x]  lo []  med []  hi   [x]  w/ ice      Temperature: 34 deg  [x]  Simultaneously performed with w/ Estim     [x]  Skin assessment post-treatment:  [x] intact [x] redness- no adverse reaction     [] redness - adverse reaction:     46 min Therapeutic Exercise:  [x]   See flow sheet :   Rationale: increase ROM, increase strength, and improve coordination to improve the patient's ability to perform reaching tasks with less irritation.     With   [x]  TE   []  TA   []  neuro   []  other: Patient Education: [x]  Review HEP    []  Progressed/Changed HEP based on:   []  positioning   []  body mechanics   []  transfers   []   heat/ice application    []  other:      Other Objective/Functional Measures: added theraband exercises    Pain Level (0-10 scale) post treatment: 0/10    ASSESSMENT/Changes in Function:   The pt tolerated treatment very well today. She is making good progress with noted increased ROM. She was able to tolerate some light resistance today as well. We will continue progressing the patient as tolerated in upcoming visits. Patient will continue to benefit from skilled PT services to modify and progress therapeutic interventions, address functional mobility deficits, address ROM deficits, address strength deficits, analyze and address soft tissue restrictions, analyze and cue movement patterns, analyze and modify body mechanics/ergonomics, and assess and modify postural abnormalities to attain remaining goals.     [x]   See Plan of Care  []   See progress note/recertification  []   See Discharge Summary         Progress towards goals / Updated goals:  Short Term Goals: To be accomplished in 5 treatments.  Patient will demonstrate independence and compliance with HEP in order to assist with carryover from PT services.  2.   Patient will be able to wash hair without pain greater than or equal to 6/10 to increase functional mobility.  3.   Patient will be able to donn/doff clothes without pain greater than or equal to 6/10 to increase functional mobility.  4.   Patient will be able to reach overhead without pain greater than or equal to 7/10 to increase functional mobility.  5.   Patient will increase   to left shoulder arom flexion in order to 120 degrees.  6.   Patient will increase  left shoulder arom abduction to 80 in order to reach overhead with less difficulty.        Long Term Goals: To be accomplished in 10 treatments.  1.   Patient will be able to get objects out of dryer without pain greater than or equal to 3/10 to increase functional mobility.  2.   Patient will be able to mop/sweep at home without pain greater than or equal to 3/10 to increase functional mobility.  3.   Patient will increase left shoulder frlexion strength to 5/5 in order to reach overhead and return to work.  4.   Patient will increase left shoulder arom IR to 50 in order to fasten clothes behind her back.    PLAN  [x]   Upgrade activities as tolerated     [x]   Continue plan of care  []   Update interventions per flow sheet       []   Discharge due to:_  []   Other:_      Kirkland Hun, PT, DPT 09/25/2021

## 2021-09-27 ENCOUNTER — Inpatient Hospital Stay: Admit: 2021-09-27 | Payer: MEDICARE | Primary: Family

## 2021-09-27 NOTE — Progress Notes (Signed)
 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    OBJECTIVE    Modality rationale: decrease inflammation and decrease pain to improve the patient's ability to perform tasks with less tissue irritation.    Min Type Additional Details       []  Estim: [] Att   [] Unatt    [] TENS instruct                  [] IFC  [] Premod   [] NMES                    [] Other:  [] w/US       [] w/ heat  [] w/ ice  Position:  Location:       []   Traction: []  Cervical       [] Lumbar                       []  Prone          [] Supine                       [] Intermittent   [] Continuous Lbs:  []  before manual  []  after manual  []  w/ heat  []  Simultaneously performed with w/ Estim    []   Ultrasound: [] Continuous   []  Pulsed                       at: []   [] Location:  W/cm2:    []  Paraffin         Location:   [] w/heat    []   Ice     []   Heat  []   Ice massage Position:  Location:    []   Laser  []   Other: Position:  Location:   10   [x]   Vasopneumatic Device Pressure:       [x]  lo []  med []  hi   [x]  w/ ice      Temperature: 34 deg  [x]  Simultaneously performed with w/ Estim     [x]  Skin assessment post-treatment:  [x] intact [x] redness- no adverse reaction     [] redness - adverse reaction:     15 min Therapeutic  Exercise:  [x]  See flow sheet :   Rationale: increase ROM, increase strength, and improve coordination to improve the patient's ability to perform reaching tasks with less irritation.      30 min Group Therapy:  [x]  See flow sheet :   Billed while completing therex  Rationale: increase ROM, increase strength, and improve coordination to improve the patient's ability to perform reaching tasks with less irritation.     With   [  x] TE   []  TA   []  neuro   []  other: Patient Education: [x]  Review HEP    []  Progressed/Changed HEP based on:   []  positioning   []  body mechanics   []  transfers   []  heat/ice application    []  other:      Other Objective/Functional Measures: added wand exercises    Pain Level (0-10 scale) post treatment: 0/10    ASSESSMENT/Changes in Function:   The pt tolerated treatment very well today. She is making good progress with noted increased ROM. Addition of wand exercises in flexion, abduction, extension, and IR today to encourage further UE strengthening, endurance level, and rom.  Pt tolerated increases well.  We will continue progressing the patient as tolerated in upcoming visits. Patient will continue to benefit from skilled PT services to modify and progress therapeutic interventions, address functional mobility deficits, address ROM deficits, address strength deficits, analyze and address soft tissue restrictions, analyze and cue movement patterns, analyze and modify body mechanics/ergonomics, and assess and modify postural abnormalities to attain remaining goals.     [x]   See Plan of Care  []   See progress note/recertification  []   See Discharge Summary         Progress towards goals / Updated goals:  Short Term Goals: To be accomplished in 5 treatments.  Patient will demonstrate independence and compliance with HEP in order to assist with carryover from PT services.  2.   Patient will be able to wash hair without pain greater than or equal to 6/10 to increase functional mobility.  3.    Patient will be able to donn/doff clothes without pain greater than or equal to 6/10 to increase functional mobility.  4.   Patient will be able to reach overhead without pain greater than or equal to 7/10 to increase functional mobility.  5.   Patient will increase   to left shoulder arom flexion in order to 120 degrees.  6.   Patient will increase left shoulder arom abduction to 80 in order to reach overhead with less difficulty.        Long Term Goals: To be accomplished in 10 treatments.  1.   Patient will be able to get objects out of dryer without pain greater than or equal to 3/10 to increase functional mobility.  2.   Patient will be able to mop/sweep at home without pain greater than or equal to 3/10 to increase functional mobility.  3.   Patient will increase left shoulder frlexion strength to 5/5 in order to reach overhead and return to work.  4.   Patient will increase left shoulder arom IR to 50 in order to fasten clothes behind her back.    PLAN  [x]   Upgrade activities as tolerated     [x]   Continue plan of care  []   Update interventions per flow sheet       []   Discharge due to:_  []   Other:_      Lauraine Joshua Knee, PT, DPT 09/27/2021

## 2021-09-27 NOTE — Discharge Instructions (Unsigned)
Therapy Discharge by Jackelyn Poling, PT at 09/27/21 1000                Author: Jackelyn Poling, PT  Service: Physical Therapy  Author Type: Physical Therapist       Filed: 11/15/21 1358  Date of Service: 09/27/21 1000  Status: Cosign Needed           Editor: Jackelyn Poling, PT (Physical Therapist)  Cosign Required: Yes               [facility]   [address]   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  Patient was called secondary to absence, however no return phone call has been received.  Patient will be required to return to Physician for new prescription if the problem persists to return to  physical therapy.  Patient is now formally discharged from physical therapy due to lack of attendance.  Goals have not been formally assessed due to patient's discontinuance.           Progress Toward Goals:   Short Term Goals: To be accomplished in 5 treatments.   1.  Patient will demonstrate independence and compliance with HEP in order to assist with carryover from PT services.   2.   Patient will be able to wash hair without pain greater than or equal to 6/10 to increase functional  mobility.   3.   Patient will be able to  donn/doff clothes without pain greater than or equal to 6/10 to increase functional mobility.   4.   Patient will be able to reach overhead without pain greater than or equal to 7/10 to increase functional mobility.   5.   Patient will increase   to left shoulder arom flexion in order to 120 degrees.   6.   Patient will increase left shoulder arom abduction to 80 in order to reach overhead with less difficulty.           Long Term Goals: To be accomplished in 10 treatments.   1.   Patient will be able to get objects out of dryer without pain greater than or equal to 3/10 to increase functional mobility.   2.   Patient will be able to mop/sweep at home without pain greater than or equal to 3/10 to increase functional mobility.   3.   Patient will increase left shoulder frlexion strength to 5/5 in order to reach overhead and return to work.  4.   Patient will increase left shoulder arom IR to 50 in order to fasten clothes behind her back.            RECOMMENDATIONS:   [x] Discontinue therapy: [] Patient has reached or is progressing toward set goals      [x] Patient is non-compliant or has abdicated      []  Due to lack of appreciable progress towards set goals      [] Other   Jackelyn Poling, PT, DPT 11/15/2021

## 2021-10-01 ENCOUNTER — Encounter: Payer: MEDICARE | Primary: Family

## 2021-10-02 ENCOUNTER — Encounter: Payer: MEDICARE | Primary: Family

## 2021-10-04 ENCOUNTER — Encounter: Payer: MEDICARE | Primary: Family

## 2021-10-10 ENCOUNTER — Encounter: Payer: MEDICARE | Primary: Family

## 2021-10-11 ENCOUNTER — Encounter: Payer: MEDICARE | Primary: Family

## 2021-10-19 ENCOUNTER — Ambulatory Visit: Admit: 2021-10-19 | Discharge: 2021-10-19 | Payer: MEDICARE | Attending: Gastroenterology | Primary: Family

## 2021-10-19 ENCOUNTER — Ambulatory Visit: Attending: Gastroenterology | Primary: Family

## 2021-10-19 DIAGNOSIS — N939 Abnormal uterine and vaginal bleeding, unspecified: Secondary | ICD-10-CM

## 2021-10-19 NOTE — Progress Notes (Signed)
Mallory Bonilla is a 55 y.o. female who presents today for the following:  Chief Complaint   Patient presents with    Abdominal Pain    Hemorrhoids     Bowels are hard sometimes , and loose somtimes    Follow Up Chronic Condition     Follow up 06-18-2021         No Known Allergies    Current Outpatient Medications   Medication Sig    traZODone (DESYREL) 100 mg tablet TAKE 2 TABLETS BY MOUTH NIGHTLY AS NEEDED FOR SLEEP    ARIPiprazole (ABILIFY) 5 mg tablet Take 5 mg by mouth daily.    True Metrix Glucose Test Strip strip USE 1 STRIP TO CHECK GLUCOSE TWICE DAILY    vilazodone (VIIBRYD) 20 mg tab tablet Take  by mouth daily.    clonazePAM (KlonoPIN) 1 mg tablet Take 1 mg by mouth two (2) times a day.    rOPINIRole (REQUIP) 0.25 mg tablet Take 0.25 mg by mouth three (3) times daily.    busPIRone (BUSPAR) 30 mg tablet TAKE 1 TABLET BY MOUTH TWICE DAILY FOR ANXIETY    pantoprazole (PROTONIX) 40 mg tablet Take 1 Tablet by mouth daily. Indications: gastroesophageal reflux disease    metFORMIN (GLUCOPHAGE) 500 mg tablet Take 500 mg by mouth two (2) times a day.    lisinopriL (PRINIVIL, ZESTRIL) 5 mg tablet Take 5 mg by mouth daily.    dulaglutide (Trulicity) 0.75 mg/0.5 mL sub-q pen 0.75 mg by SubCUTAneous route every seven (7) days. monday    Emgality Pen 120 mg/mL injection 120 mg by SubCUTAneous route every thirty (30) days.    topiramate (Topamax) 50 mg tablet Take 1 Tablet by mouth nightly. Indications: migraine prevention    pravastatin (PRAVACHOL) 40 mg tablet Take 40 mg by mouth nightly.    estradioL (ESTRACE) 2 mg tablet Take  by mouth daily.    aspirin delayed-release 81 mg tablet Take 81 mg by mouth daily.    ALPRAZolam (XANAX) 0.25 mg tablet TAKE 1 TO 2 TABLETS BY MOUTH ONCE DAILY AS NEEDED FOR SEVERE ANXIETY DURING TRIP    rizatriptan (MAXALT) 5 mg tablet Take 5 mg by mouth once as needed for Migraine. May repeat in 2 hours if needed    hydroCHLOROthiazide (HYDRODIURIL) 25 mg tablet Take 1 Tablet by mouth daily.  Indications: high blood pressure     No current facility-administered medications for this visit.       Past Medical History:   Diagnosis Date    Acid reflux 06/13/2021    Agoraphobia     Anxiety 01/11/2021    Anxiety     Arthritis     Bipolar 1 disorder (HCC) 01/11/2021    COVID-19 10/09/2021    Diabetes (HCC)     Diabetes mellitus type 2, controlled (HCC) 01/11/2021    Headache     Headache 01/11/2021    Hypercholesterolemia     Hyperlipidemia 01/11/2021    Hypertension     Hypertension 01/11/2021    Throat pain        Past Surgical History:   Procedure Laterality Date    COLONOSCOPY  05/30/2017    HX GYN      uterine ablasion    HX OOPHORECTOMY      HX ORTHOPAEDIC      foot surgery       Family History   Problem Relation Age of Onset    Diabetes Mother     Cancer Mother  Diabetes Sister     Heart Disease Sister     Cancer Sister     Diabetes Brother     Heart Disease Brother     Liver Disease Other     Macular Degen Other     Emphysema Father        Social History     Socioeconomic History    Marital status: DIVORCED     Spouse name: Not on file    Number of children: Not on file    Years of education: Not on file    Highest education level: Not on file   Occupational History    Not on file   Tobacco Use    Smoking status: Never    Smokeless tobacco: Never   Vaping Use    Vaping Use: Never used   Substance and Sexual Activity    Alcohol use: Not Currently    Drug use: Not Currently    Sexual activity: Yes   Other Topics Concern    Military Service No    Blood Transfusions No    Caffeine Concern No    Occupational Exposure No    Hobby Hazards No    Sleep Concern No    Stress Concern No    Weight Concern No    Special Diet No    Back Care No    Exercise No    Bike Helmet No    Seat Belt No    Self-Exams No   Social History Narrative    Not on file     Social Determinants of Health     Financial Resource Strain: Not on file   Food Insecurity: Not on file   Transportation Needs: Not on file   Physical Activity:  Not on file   Stress: Not on file   Social Connections: Not on file   Intimate Partner Violence: Not on file   Housing Stability: Not on file         HPI  54 year old female with history of hypertension, hyperlipidemia, gastroesophageal reflux disease, diabetes mellitus type 2, depression/anxiety who comes in for evaluation GERD and dysphagia.  Patient had a EGD on 06/29/2021 which showed antral gastritis and distal esophagitis.  She states she does not have much heartburn now.  She has a eating anxiety because she is not swallowing well.  She states she cannot eat when she is away from home.  She does okay while eating at home.  No recent hiccups.  She takes pantoprazole 40 mg daily.  Her weight has remained stable.  She has pain in the left lower quadrant mainly at night.  Approximate 3 weeks ago she had spotting of bright red blood vaginally.  He had crampy lower abdominal pain afterwards.  She states this is the first time she has had that type of pain in over 16 years and it was related to her menstrual cycle at that time.  He had a Pap smear PCP and she has an appointment with her GYN.  Her last colonoscopy was 4+ years ago in [place], she states it was normal.  She has a flare of hemorrhoids recently and may see a little blood if she strains.    Review of Systems   Constitutional: Negative.    HENT: Negative.  Negative for nosebleeds.    Eyes: Negative.    Respiratory: Negative.     Cardiovascular: Negative.    Gastrointestinal:  Positive for abdominal pain and heartburn. Negative for blood  in stool, constipation, diarrhea, melena, nausea and vomiting.   Genitourinary: Negative.    Musculoskeletal: Negative.    Skin: Negative.    Neurological: Negative.    Endo/Heme/Allergies: Negative.    Psychiatric/Behavioral: Negative.     All other systems reviewed and are negative.      Visit Vitals  BP (!) 140/80 (BP 1 Location: Left upper arm, BP Patient Position: Sitting, BP Cuff Size: Adult)   Pulse (!) 103    Temp 98.6 ??F (37 ??C) (Temporal)   Resp 14   Ht 5\' 5"  (1.651 m)   Wt 86.8 kg (191 lb 6.4 oz)   SpO2 96% Comment: room air   BMI 31.85 kg/m??     Physical Exam  Vitals and nursing note reviewed.   Constitutional:       Appearance: Normal appearance. She is obese.   HENT:      Head: Normocephalic and atraumatic.      Nose: Nose normal.      Mouth/Throat:      Mouth: Mucous membranes are moist.      Pharynx: Oropharynx is clear.   Eyes:      General: No scleral icterus.     Conjunctiva/sclera: Conjunctivae normal.      Pupils: Pupils are equal, round, and reactive to light.   Cardiovascular:      Rate and Rhythm: Normal rate and regular rhythm.      Pulses: Normal pulses.      Heart sounds: Normal heart sounds.   Pulmonary:      Effort: Pulmonary effort is normal.      Breath sounds: Normal breath sounds.   Abdominal:      General: Bowel sounds are normal. There is no distension.      Palpations: Abdomen is soft. There is no mass.      Tenderness: There is abdominal tenderness. There is guarding. There is no right CVA tenderness, left CVA tenderness or rebound.      Hernia: No hernia is present.   Musculoskeletal:         General: Normal range of motion.      Cervical back: Normal range of motion and neck supple.   Skin:     General: Skin is warm and dry.      Coloration: Skin is not jaundiced.   Neurological:      General: No focal deficit present.      Mental Status: She is alert and oriented to person, place, and time.   Psychiatric:         Mood and Affect: Mood normal.         Behavior: Behavior normal.         Thought Content: Thought content normal.         Judgment: Judgment normal.          1. Vaginal bleeding, abnormal  Has abnormal uterine bleeding.  Obtain an ultrasound now and patient have further work-up by her GYN already scheduled.  - PELV NON OBS; Future  - US TRANSVAGINAL; Future    2. Gastroesophageal reflux disease with esophagitis without hemorrhage  Continue the pantoprazole 40 mg daily    3.  Esophageal dysphagia  Improved    4. Hiccough  Resolved    5. LLQ abdominal pain  Possibly secondary to GYN causes.  Uncertain whether the colonoscopy done in the past showed diverticular disease, but also has a possibility and may need further work-up depending on results of GYN work-up.

## 2021-10-19 NOTE — Progress Notes (Signed)
 1. Have you been to the ER, urgent care clinic since your last visit?  Hospitalized since your last visit? no    2. Have you seen or consulted any other health care providers outside of the [facility] since your last visit?  Include any pap smears or colon screening.  No   Chief Complaint   Patient presents with    Abdominal Pain    Hemorrhoids     Bowels are hard sometimes , and loose somtimes    Follow Up Chronic Condition     Follow up 06-18-2021     Visit Vitals  BP (!) 140/80 (BP 1 Location: Left upper arm, BP Patient Position: Sitting, BP Cuff Size: Adult)   Pulse (!) 103   Temp 98.6 F (37 C) (Temporal)   Resp 14   Ht 5' 5 (1.651 m)   Wt 86.8 kg (191 lb 6.4 oz)   SpO2 96% Comment: room air   BMI 31.85 kg/m

## 2021-10-23 ENCOUNTER — Encounter

## 2021-10-24 ENCOUNTER — Encounter: Attending: Orthopaedic Surgery | Primary: Family

## 2021-10-24 ENCOUNTER — Ambulatory Visit: Attending: Orthopaedic Surgery | Primary: Family

## 2021-10-24 DIAGNOSIS — M7552 Bursitis of left shoulder: Secondary | ICD-10-CM

## 2021-10-24 NOTE — Progress Notes (Signed)
Name: Mallory Bonilla    DOB: 1966/11/15     Service Dept: [facility]    Chief Complaint   Patient presents with    Shoulder Pain        There were no vitals taken for this visit.     No Known Allergies     Current Outpatient Medications   Medication Sig Dispense Refill    traZODone (DESYREL) 100 mg tablet TAKE 2 TABLETS BY MOUTH NIGHTLY AS NEEDED FOR SLEEP 90 Tablet 0    ALPRAZolam (XANAX) 0.25 mg tablet TAKE 1 TO 2 TABLETS BY MOUTH ONCE DAILY AS NEEDED FOR SEVERE ANXIETY DURING TRIP      ARIPiprazole (ABILIFY) 5 mg tablet Take 5 mg by mouth daily.      True Metrix Glucose Test Strip strip USE 1 STRIP TO CHECK GLUCOSE TWICE DAILY      vilazodone (VIIBRYD) 20 mg tab tablet Take  by mouth daily.      clonazePAM (KlonoPIN) 1 mg tablet Take 1 mg by mouth two (2) times a day.      rOPINIRole (REQUIP) 0.25 mg tablet Take 0.25 mg by mouth three (3) times daily.      rizatriptan (MAXALT) 5 mg tablet Take 5 mg by mouth once as needed for Migraine. May repeat in 2 hours if needed      busPIRone (BUSPAR) 30 mg tablet TAKE 1 TABLET BY MOUTH TWICE DAILY FOR ANXIETY 180 Tablet 0    pantoprazole (PROTONIX) 40 mg tablet Take 1 Tablet by mouth daily. Indications: gastroesophageal reflux disease 30 Tablet 3    metFORMIN (GLUCOPHAGE) 500 mg tablet Take 500 mg by mouth two (2) times a day.      lisinopriL (PRINIVIL, ZESTRIL) 5 mg tablet Take 5 mg by mouth daily.      dulaglutide (Trulicity) 0.75 mg/0.5 mL sub-q pen 0.75 mg by SubCUTAneous route every seven (7) days. monday      Emgality Pen 120 mg/mL injection 120 mg by SubCUTAneous route every thirty (30) days.      topiramate (Topamax) 50 mg tablet Take 1 Tablet by mouth nightly. Indications: migraine prevention 30 Tablet 0    hydroCHLOROthiazide (HYDRODIURIL) 25 mg tablet Take 1 Tablet by mouth daily. Indications: high blood pressure 30 Tablet 0    pravastatin (PRAVACHOL) 40 mg tablet Take 40 mg by mouth nightly.      estradioL (ESTRACE) 2 mg  tablet Take  by mouth daily.      aspirin delayed-release 81 mg tablet Take 81 mg by mouth daily.        Patient Active Problem List   Diagnosis Code    Headache R51.9    Hypertension I10    Diabetes mellitus type 2, controlled (HCC) E11.9    Anxiety F41.9    Hyperlipidemia E78.5    Bipolar 1 disorder (HCC) F31.9    MDD (major depressive disorder) F32.9    Agoraphobia F40.00    Acid reflux K21.9      Family History   Problem Relation Age of Onset    Diabetes Mother     Cancer Mother     Diabetes Sister     Heart Disease Sister     Cancer Sister     Diabetes Brother     Heart Disease Brother     Liver Disease Other     Macular Degen Other     Emphysema Father       Social History  Socioeconomic History    Marital status: DIVORCED   Tobacco Use    Smoking status: Never    Smokeless tobacco: Never   Vaping Use    Vaping Use: Never used   Substance and Sexual Activity    Alcohol use: Not Currently    Drug use: Not Currently    Sexual activity: Yes   Other Topics Concern    Military Service No    Blood Transfusions No    Caffeine Concern No    Occupational Exposure No    Hobby Hazards No    Sleep Concern No    Stress Concern No    Weight Concern No    Special Diet No    Back Care No    Exercise No    Bike Helmet No    Seat Belt No    Self-Exams No      Past Surgical History:   Procedure Laterality Date    COLONOSCOPY  05/30/2017    HX GYN      uterine ablasion    HX OOPHORECTOMY      HX ORTHOPAEDIC      foot surgery      Past Medical History:   Diagnosis Date    Acid reflux 06/13/2021    Agoraphobia     Anxiety 01/11/2021    Anxiety     Arthritis     Bipolar 1 disorder (HCC) 01/11/2021    COVID-19 10/09/2021    Diabetes (HCC)     Diabetes mellitus type 2, controlled (HCC) 01/11/2021    Headache     Headache 01/11/2021    Hypercholesterolemia     Hyperlipidemia 01/11/2021    Hypertension     Hypertension 01/11/2021    Throat pain         I have reviewed and agree with PFSH and ROS and intake form in chart and the  record furthermore I have reviewed prior medical record(s) regarding this patients care during this appointment.     Review of Systems:   Patient is a pleasant appearing individual, appropriately dressed, well hydrated, well nourished, who is alert, appropriately oriented for age, and in no acute distress with a normal gait and normal affect who does not appear to be in any significant pain.     Physical Exam:  Left Shoulder - Grossly neurovascularly intact. Range of motion-Full passive, Active with impingement. No Point tenderness, Strength-weakness with abduction, some mild crepitation, No skin lesion are identified, No instabilty is noted, No apprehension. No Swelling.     Right Shoulder - Grossly neurovascularly intact, Full Range of motion, No point tenderness, No weakness, No skin lesions, No Instability, No apprehension, No swelling.     Encounter Diagnoses     ICD-10-CM ICD-9-CM   1. Bursitis of left shoulder  M75.52 726.10       HPI:  The patient is here with a chief complaint of left shoulder pain, diagnosed with shoulder bursitis.  Post injection, feeling better.    Assessment/Plan:  Plan at this point, my recommendation would be to continue with physical therapy.  We did injection for frozen shoulder and therapy.  She is doing better.  Plan at this point, activities as tolerated, weightbearing started, no restrictions.  We will see her back in 4 to 6 weeks.  If not better, we will consider physical therapy and go from there.      As part of continued conservative pain management options the patient was advised to utilize Tylenol or  OTC NSAIDS as long as it is not medically contraindicated.     Return to Office:   Follow-up and Dispositions    Return in about 6 weeks (around 12/05/2021).           Scribed by Sherian Rein as dictated by [facility] A. Allena Katz, MD.  Documentation True and Accepted Tashera Montalvo A. Allena Katz, MD

## 2021-10-26 ENCOUNTER — Encounter: Attending: Registered Nurse | Primary: Family

## 2021-10-27 ENCOUNTER — Inpatient Hospital Stay
Admit: 2021-10-27 | Discharge: 2021-10-28 | Disposition: A | Discharge status: Home / Self Care | Payer: MEDICARE | Attending: Emergency Medicine

## 2021-10-27 NOTE — ED Provider Notes (Signed)
ED Provider Notes by Domingo Dimes, MD at 10/27/21 1859                Author: Domingo Dimes, MD  Service: Emergency Medicine  Author Type: Physician       Filed: 10/28/21 0858  Date of Service: 10/27/21 1859  Status: Signed          Editor: Domingo Dimes, MD (Physician)               EMERGENCY DEPARTMENT HISTORY AND PHYSICAL EXAM           Date: 10/27/2021   Patient Name: Mallory Bonilla        History of Presenting Illness          Chief Complaint       Patient presents with        ?  Vaginal Bleeding           History Provided By: Patient      HPI: Mallory Bonilla, 55 y.o. female past medical history significant for 18 years postmenopausal, 18 years status post oophorectomy, and 17 years  endometrial ablation presents with a complaint of vaginal bleeding first time in 17 years where there was enough blood to soak through her underwear and soiled the clothes that she was wearing to the point where patient had to change her bottoms, patient  states that at the time there was abdominal cramping denies any cramping presently, patient states that when she went to urinate she saw small clot in the toilet bowl patient denies being on any significant anticoagulant therapy except for baby aspirin  81 mg daily      There are no other complaints, changes, or physical findings at this time.      PCP: Catarina Hartshorn, NP        No current facility-administered medications on file prior to encounter.          Current Outpatient Medications on File Prior to Encounter          Medication  Sig  Dispense  Refill           ?  traZODone (DESYREL) 100 mg tablet  TAKE 2 TABLETS BY MOUTH NIGHTLY AS NEEDED FOR SLEEP  90 Tablet  0     ?  ALPRAZolam (XANAX) 0.25 mg tablet  TAKE 1 TO 2 TABLETS BY MOUTH ONCE DAILY AS NEEDED FOR SEVERE ANXIETY DURING TRIP         ?  ARIPiprazole (ABILIFY) 5 mg tablet  Take 5 mg by mouth daily.               ?  True Metrix Glucose Test Strip strip  USE 1 STRIP TO CHECK GLUCOSE  TWICE DAILY               ?  vilazodone (VIIBRYD) 20 mg tab tablet  Take  by mouth daily.         ?  clonazePAM (KlonoPIN) 1 mg tablet  Take 1 mg by mouth two (2) times a day.         ?  rOPINIRole (REQUIP) 0.25 mg tablet  Take 0.25 mg by mouth three (3) times daily.         ?  rizatriptan (MAXALT) 5 mg tablet  Take 5 mg by mouth once as needed for Migraine. May repeat in 2 hours if needed         ?  busPIRone (BUSPAR) 30 mg tablet  TAKE  1 TABLET BY MOUTH TWICE DAILY FOR ANXIETY  180 Tablet  0     ?  pantoprazole (PROTONIX) 40 mg tablet  Take 1 Tablet by mouth daily. Indications: gastroesophageal reflux disease  30 Tablet  3     ?  metFORMIN (GLUCOPHAGE) 500 mg tablet  Take 500 mg by mouth two (2) times a day.         ?  lisinopriL (PRINIVIL, ZESTRIL) 5 mg tablet  Take 5 mg by mouth daily.         ?  dulaglutide (Trulicity) 0.75 mg/0.5 mL sub-q pen  0.75 mg by SubCUTAneous route every seven (7) days. monday         ?  Emgality Pen 120 mg/mL injection  120 mg by SubCUTAneous route every thirty (30) days.         ?  topiramate (Topamax) 50 mg tablet  Take 1 Tablet by mouth nightly. Indications: migraine prevention  30 Tablet  0     ?  hydroCHLOROthiazide (HYDRODIURIL) 25 mg tablet  Take 1 Tablet by mouth daily. Indications: high blood pressure  30 Tablet  0     ?  pravastatin (PRAVACHOL) 40 mg tablet  Take 40 mg by mouth nightly.         ?  estradioL (ESTRACE) 2 mg tablet  Take  by mouth daily.               ?  aspirin delayed-release 81 mg tablet  Take 81 mg by mouth daily.                 Past History        Past Medical History:     Past Medical History:        Diagnosis  Date         ?  Acid reflux  06/13/2021     ?  Agoraphobia       ?  Anxiety  01/11/2021     ?  Anxiety       ?  Arthritis       ?  Bipolar 1 disorder (HCC)  01/11/2021     ?  COVID-19  10/09/2021     ?  Diabetes (HCC)       ?  Diabetes mellitus type 2, controlled (HCC)  01/11/2021     ?  Headache       ?  Headache  01/11/2021     ?   Hypercholesterolemia       ?  Hyperlipidemia  01/11/2021     ?  Hypertension       ?  Hypertension  01/11/2021         ?  Throat pain             Past Surgical History:     Past Surgical History:         Procedure  Laterality  Date          ?  COLONOSCOPY    05/30/2017     ?  HX GYN              uterine ablasion          ?  HX OOPHORECTOMY         ?  HX ORTHOPAEDIC              foot surgery           Family History:     Family  History         Problem  Relation  Age of Onset          ?  Diabetes  Mother       ?  Cancer  Mother       ?  Diabetes  Sister       ?  Heart Disease  Sister       ?  Cancer  Sister       ?  Diabetes  Brother       ?  Heart Disease  Brother       ?  Liver Disease  Other       ?  Macular Degen  Other            ?  Emphysema  Father             Social History:     Social History          Tobacco Use         ?  Smoking status:  Never     ?  Smokeless tobacco:  Never       Vaping Use         ?  Vaping Use:  Never used       Substance Use Topics         ?  Alcohol use:  Not Currently         ?  Drug use:  Not Currently           Allergies:   No Known Allergies        Review of Systems     Review of Systems    Constitutional:  Negative for chills and fever.    HENT:  Negative for rhinorrhea and sore throat.     Eyes:  Negative for pain and visual disturbance.    Respiratory:  Negative for cough and shortness of breath.     Cardiovascular:  Negative for chest pain and leg swelling.    Gastrointestinal:  Negative for abdominal pain and vomiting.    Endocrine: Negative for polydipsia and polyuria.    Genitourinary:  Positive for menstrual problem and vaginal bleeding . Negative for dysuria and hematuria.    Musculoskeletal:  Negative for back pain and neck pain.    Skin:  Negative for color change and pallor.    Neurological:  Negative for weakness and headaches.    Psychiatric/Behavioral:  Negative for agitation and suicidal ideas.          Physical Exam     Physical Exam   Vitals and nursing note  reviewed.    Constitutional:        General: She is not in acute distress.      Appearance: She is not ill-appearing, toxic-appearing or diaphoretic.    HENT:       Head: Normocephalic and atraumatic.       Right Ear: Tympanic membrane normal.       Left Ear: Tympanic membrane normal.       Nose: Nose normal. No congestion.       Mouth/Throat:       Mouth: Mucous membranes are moist.       Pharynx: Oropharynx is clear.    Eyes:       Extraocular Movements: Extraocular movements intact.       Conjunctiva/sclera: Conjunctivae normal.       Pupils: Pupils are equal, round, and reactive to light.  Cardiovascular:       Rate and Rhythm: Normal rate and regular rhythm.       Pulses: Normal pulses.       Heart sounds: Normal heart sounds.    Pulmonary:       Effort: Pulmonary effort is normal.       Breath sounds: Normal breath sounds.     Abdominal:       General: Bowel sounds are normal.       Palpations: Abdomen is soft.       Tenderness: There is abdominal tenderness in the left lower quadrant .    Genitourinary:      Vagina: Bleeding present.       Uterus: Not tender.        Adnexa:          Left: Tenderness present.        Rectum: Normal.    Musculoskeletal:           General: No tenderness, deformity or signs of injury. Normal range of motion.       Cervical back: Normal range of motion and neck supple. No rigidity or tenderness.     Lymphadenopathy:       Cervical: No cervical adenopathy.    Skin:      General: Skin is warm and dry.       Capillary Refill: Capillary refill takes less than 2 seconds.       Findings: No rash.    Neurological:       General: No focal deficit present.       Mental Status: She is alert and oriented to person, place, and time.       Cranial Nerves: No cranial nerve deficit.       Sensory: No sensory deficit.    Psychiatric:          Mood and Affect: Mood normal.          Behavior: Behavior normal.            Lab and Diagnostic Study Results     Labs -    No results found for this or  any previous visit (from the past 12 hour(s)).      Radiologic Studies -    @lastxrresult @     CT Results  (Last 48 hours)             None                    CXR Results  (Last 48 hours)             None                       Medical Decision Making and ED Course     Differential Diagnosis & Medical Decision Making Provider Note:    Vaginal bleeding DDx uterine fibroid, uterine mass, coagulopathy      - I am the first provider for this patient.  I reviewed the vital signs, available nursing notes, past medical history, past surgical history, family history and social history. The patients presenting problems have been discussed, and they are in agreement  with the care plan formulated and outlined with them.  I have encouraged them to ask questions as they arise throughout their visit.      Vital Signs-Reviewed the patient's vital signs.   Patient Vitals for the past 12 hrs:  Temp  Pulse  Resp  BP  SpO2            10/27/21 1834  98 ??F (36.7 ??C)  92  20  (!) 175/96  95 %           ED Course:                Procedures     Performed by: Domingo Dimes, MD   Procedures          Disposition     Disposition: Condition stable and improved   DC- Adult Discharges: All of the diagnostic tests were reviewed and questions answered. Diagnosis, care plan and treatment options were discussed.  The patient understands the instructions and will follow up as directed. The patients results have been  reviewed with them.  They have been counseled regarding their diagnosis.  The patient verbally convey understanding and agreement of the signs, symptoms, diagnosis, treatment and prognosis and additionally agrees to follow up as recommended with their  PCP in 24 - 48 hours.  They also agree with the care-plan and convey that all of their questions have been answered.  I have also put together some discharge instructions for them that include: 1) educational information regarding their diagnosis, 2)  how to care for their  diagnosis at home, as well a 3) list of reasons why they would want to return to the ED prior to their follow-up appointment, should their condition change.      DISCHARGE PLAN:   1.      Current Discharge Medication List                 CONTINUE these medications which have NOT CHANGED          Details        traZODone (DESYREL) 100 mg tablet  TAKE 2 TABLETS BY MOUTH NIGHTLY AS NEEDED FOR SLEEP   Qty: 90 Tablet, Refills: 0          Associated Diagnoses: Insomnia, unspecified type               ALPRAZolam (XANAX) 0.25 mg tablet  TAKE 1 TO 2 TABLETS BY MOUTH ONCE DAILY AS NEEDED FOR SEVERE ANXIETY DURING TRIP               ARIPiprazole (ABILIFY) 5 mg tablet  Take 5 mg by mouth daily.               True Metrix Glucose Test Strip strip  USE 1 STRIP TO CHECK GLUCOSE TWICE DAILY               vilazodone (VIIBRYD) 20 mg tab tablet  Take  by mouth daily.               clonazePAM (KlonoPIN) 1 mg tablet  Take 1 mg by mouth two (2) times a day.               rOPINIRole (REQUIP) 0.25 mg tablet  Take 0.25 mg by mouth three (3) times daily.               rizatriptan (MAXALT) 5 mg tablet  Take 5 mg by mouth once as needed for Migraine. May repeat in 2 hours if needed               busPIRone (BUSPAR) 30 mg tablet  TAKE 1 TABLET BY MOUTH TWICE DAILY FOR ANXIETY   Qty: 180 Tablet, Refills: 0  Associated Diagnoses: Generalized anxiety disorder               pantoprazole (PROTONIX) 40 mg tablet  Take 1 Tablet by mouth daily. Indications: gastroesophageal reflux disease   Qty: 30 Tablet, Refills: 3          Associated Diagnoses: Gastroesophageal reflux disease with esophagitis without hemorrhage               metFORMIN (GLUCOPHAGE) 500 mg tablet  Take 500 mg by mouth two (2) times a day.               lisinopriL (PRINIVIL, ZESTRIL) 5 mg tablet  Take 5 mg by mouth daily.               dulaglutide (Trulicity) 0.75 mg/0.5 mL sub-q pen  0.75 mg by SubCUTAneous route every seven (7) days. monday               Emgality Pen 120 mg/mL  injection  120 mg by SubCUTAneous route every thirty (30) days.               topiramate (Topamax) 50 mg tablet  Take 1 Tablet by mouth nightly. Indications: migraine prevention   Qty: 30 Tablet, Refills: 0               hydroCHLOROthiazide (HYDRODIURIL) 25 mg tablet  Take 1 Tablet by mouth daily. Indications: high blood pressure   Qty: 30 Tablet, Refills: 0               pravastatin (PRAVACHOL) 40 mg tablet  Take 40 mg by mouth nightly.               estradioL (ESTRACE) 2 mg tablet  Take  by mouth daily.               aspirin delayed-release 81 mg tablet  Take 81 mg by mouth daily.                      2.      Follow-up Information      None             3.  Return to ED if worse    4.      Current Discharge Medication List                Remove if admitted/transferred        Diagnosis/Clinical Impression        Clinical Impression: No diagnosis found.      Attestations: I,  Domingo Dimes, MD, am the primary clinician of record.      Please note that this dictation was completed with Dragon, the computer voice recognition software.  Quite often unanticipated grammatical, syntax, homophones, and other interpretive errors are inadvertently  transcribed by the computer software.  Please disregard these errors.  Please excuse any errors that have escaped final proofreading.  Thank you.

## 2021-10-27 NOTE — ED Provider Notes (Signed)
ED Provider Notes by Matthias Hughs, MD at 10/27/21 2100                Author: Matthias Hughs, MD  Service: EMERGENCY  Author Type: Physician       Filed: 10/28/21 0322  Date of Service: 10/27/21 2100  Status: Signed          Editor: Matthias Hughs, MD (Physician)               EMERGENCY DEPARTMENT HISTORY AND PHYSICAL EXAM           Date: 10/27/2021   Patient Name: Mallory Bonilla        History of Presenting Illness          Chief Complaint       Patient presents with        ?  Vaginal Bleeding           History Provided By: Patient      HPI: Mallory Bonilla, 55 y.o. female       There are no other complaints, changes, or physical findings at this time.      PCP: Catarina Hartshorn, NP        No current facility-administered medications on file prior to encounter.          Current Outpatient Medications on File Prior to Encounter          Medication  Sig  Dispense  Refill           ?  traZODone (DESYREL) 100 mg tablet  TAKE 2 TABLETS BY MOUTH NIGHTLY AS NEEDED FOR SLEEP  90 Tablet  0     ?  ALPRAZolam (XANAX) 0.25 mg tablet  TAKE 1 TO 2 TABLETS BY MOUTH ONCE DAILY AS NEEDED FOR SEVERE ANXIETY DURING TRIP         ?  ARIPiprazole (ABILIFY) 5 mg tablet  Take 5 mg by mouth daily.         ?  True Metrix Glucose Test Strip strip  USE 1 STRIP TO CHECK GLUCOSE TWICE DAILY         ?  vilazodone (VIIBRYD) 20 mg tab tablet  Take  by mouth daily.         ?  clonazePAM (KlonoPIN) 1 mg tablet  Take 1 mg by mouth two (2) times a day.               ?  rOPINIRole (REQUIP) 0.25 mg tablet  Take 0.25 mg by mouth three (3) times daily.               ?  rizatriptan (MAXALT) 5 mg tablet  Take 5 mg by mouth once as needed for Migraine. May repeat in 2 hours if needed         ?  busPIRone (BUSPAR) 30 mg tablet  TAKE 1 TABLET BY MOUTH TWICE DAILY FOR ANXIETY  180 Tablet  0     ?  pantoprazole (PROTONIX) 40 mg tablet  Take 1 Tablet by mouth daily. Indications: gastroesophageal reflux disease  30 Tablet  3     ?  metFORMIN  (GLUCOPHAGE) 500 mg tablet  Take 500 mg by mouth two (2) times a day.         ?  lisinopriL (PRINIVIL, ZESTRIL) 5 mg tablet  Take 5 mg by mouth daily.         ?  dulaglutide (Trulicity) 0.75 mg/0.5 mL sub-q pen  0.75 mg by  SubCUTAneous route every seven (7) days. monday         ?  Emgality Pen 120 mg/mL injection  120 mg by SubCUTAneous route every thirty (30) days.         ?  topiramate (Topamax) 50 mg tablet  Take 1 Tablet by mouth nightly. Indications: migraine prevention  30 Tablet  0     ?  hydroCHLOROthiazide (HYDRODIURIL) 25 mg tablet  Take 1 Tablet by mouth daily. Indications: high blood pressure  30 Tablet  0     ?  pravastatin (PRAVACHOL) 40 mg tablet  Take 40 mg by mouth nightly.         ?  estradioL (ESTRACE) 2 mg tablet  Take  by mouth daily.               ?  aspirin delayed-release 81 mg tablet  Take 81 mg by mouth daily.                 Past History        Past Medical History:     Past Medical History:        Diagnosis  Date         ?  Acid reflux  06/13/2021     ?  Agoraphobia       ?  Anxiety  01/11/2021     ?  Anxiety       ?  Arthritis       ?  Bipolar 1 disorder (HCC)  01/11/2021     ?  COVID-19  10/09/2021     ?  Diabetes (HCC)       ?  Diabetes mellitus type 2, controlled (HCC)  01/11/2021     ?  Headache       ?  Headache  01/11/2021     ?  Hypercholesterolemia       ?  Hyperlipidemia  01/11/2021     ?  Hypertension       ?  Hypertension  01/11/2021         ?  Throat pain             Past Surgical History:     Past Surgical History:         Procedure  Laterality  Date          ?  COLONOSCOPY    05/30/2017     ?  HX GYN              uterine ablasion          ?  HX OOPHORECTOMY         ?  HX ORTHOPAEDIC              foot surgery           Family History:     Family History         Problem  Relation  Age of Onset          ?  Diabetes  Mother       ?  Cancer  Mother       ?  Diabetes  Sister       ?  Heart Disease  Sister       ?  Cancer  Sister       ?  Diabetes  Brother       ?  Heart Disease   Brother       ?  Liver Disease  Other       ?  Macular Degen  Other            ?  Emphysema  Father             Social History:     Social History          Tobacco Use         ?  Smoking status:  Never     ?  Smokeless tobacco:  Never       Vaping Use         ?  Vaping Use:  Never used       Substance Use Topics         ?  Alcohol use:  Not Currently         ?  Drug use:  Not Currently           Allergies:   No Known Allergies        Review of Systems     Review of Systems        Physical Exam     Physical Exam        Lab and Diagnostic Study Results     Labs -         Recent Results (from the past 12 hour(s))     URINALYSIS W/ RFLX MICROSCOPIC          Collection Time: 10/27/21  8:15 PM         Result  Value  Ref Range            Color  Yellow/Straw          Appearance  Cloudy (A)  Clear         Specific gravity  1.025  1.003 - 1.030         pH (UA)  5.5  5.0 - 8.0         Protein  Trace (A)  Negative mg/dL       Glucose  Negative  Negative mg/dL       Ketone  Negative  Negative mg/dL       Bilirubin  Negative  Negative         Blood  Large (A)  Negative         Urobilinogen  0.2  0.2 - 1.0 EU/dL       Nitrites  Negative  Negative         Leukocyte Esterase  Trace (A)  Negative         URINE MICROSCOPIC          Collection Time: 10/27/21  8:15 PM         Result  Value  Ref Range            WBC  5-10  0 - 5 /hpf       RBC  50-100  0 - 3 /hpf            Bacteria  Negative  Negative /hpf           Radiologic Studies -    @     CT Results  (Last 48 hours)                                       10/27/21 1940    CT ABD PELV WO CONT  Final  result            Impression:    No significant abnormalities                       Narrative:    EXAM: CT ABD PELV WO CONT             INDICATION: Abdominal cramping and vaginal bleeding             COMPARISON: None             IV CONTRAST: None.             ORAL CONTRAST: None             TECHNIQUE:       Thin axial images were obtained through the abdomen and pelvis.  Coronal and      sagittal reformats were generated. CT dose reduction was achieved through use of      a standardized protocol tailored for this examination and automatic exposure      control for dose modulation.              The absence of intravenous contrast material reduces the sensitivity for      evaluation of the vasculature and solid organs.             FINDINGS:       LOWER THORAX: Small bibasilar infiltrates.      LIVER: No mass.      BILIARY TREE: Gallbladder is within normal limits. CBD is not dilated.      SPLEEN: within normal limits.      PANCREAS: No focal abnormality.      ADRENALS: Unremarkable.      KIDNEYS/URETERS: No calculus or hydronephrosis.      STOMACH: Unremarkable.      SMALL BOWEL: No dilatation or wall thickening.      COLON: No dilatation or wall thickening.      APPENDIX: No significant abnormalities      PERITONEUM: No ascites or pneumoperitoneum.      RETROPERITONEUM: No lymphadenopathy or aortic aneurysm.      REPRODUCTIVE ORGANS: No significant abnormalities      URINARY BLADDER: No mass or calculus.      BONES: No destructive bone lesion.      ABDOMINAL WALL: No mass or hernia.      ADDITIONAL COMMENTS: N/A                                      CXR Results  (Last 48 hours)             None                       Medical Decision Making and ED Course     Differential Diagnosis & Medical Decision Making Provider Note:          - I am the first provider for this patient.  I reviewed the vital signs, available nursing notes, past medical history, past surgical history, family history and social history. The patients presenting problems have been discussed, and they are in agreement  with the care plan formulated and outlined with them.  I have encouraged them to ask questions as they arise throughout their visit.      Vital Signs-Reviewed the patient's vital signs.   Patient Vitals for  the past 12 hrs:            Temp  Pulse  Resp  BP  SpO2            10/27/21 2056  --  88  19  (!)  165/94  96 %            10/27/21 1834  98 ??F (36.7 ??C)  92  20  (!) 175/96  95 %           ED Course:                Procedures     Performed by: Matthias Hughs, MD   Procedures          Disposition     Disposition: Condition    DC-       DISCHARGE PLAN:   1. Cannot display discharge medications since this patient is not currently admitted.      2.      Follow-up Information                  Follow up With  Specialties  Details  Why  Contact Info              Catarina Hartshorn, NP  Nurse Practitioner      [address]   [place] [address]   [phone]                     3.  Return to ED if worse    4.      Discharge Medication List as of 10/27/2021  8:58 PM                Remove if admitted/transferred        Diagnosis/Clinical Impression        Clinical Impression:       1.  Abnormal vaginal bleeding in postmenopausal patient            Attestations: I,  Matthias Hughs, MD, am the primary clinician of record.      Please note that this dictation was completed with Dragon, the computer voice recognition software.  Quite often unanticipated grammatical, syntax, homophones, and other interpretive errors are inadvertently  transcribed by the computer software.  Please disregard these errors.  Please excuse any errors that have escaped final proofreading.  Thank you.

## 2021-10-27 NOTE — ED Notes (Signed)
Pt reports she had vaginal bleeding 3 weeks ago and had a pap smear which was normal. PT followed up with Dr. Gwinda Passe and was told she may have diverticulitis and she is scheduled for a pelvic ultrasound next week. Pt denies rectal bleeding. Pt reports she started bleeding again today. Reports heavy vaginal bleeding.

## 2021-10-28 LAB — URINE MICROSCOPIC
BACTERIA, URINE: NEGATIVE /hpf
Bacteria: NEGATIVE /hpf

## 2021-10-28 LAB — URINALYSIS W/ RFLX MICROSCOPIC
Bilirubin, Urine: NEGATIVE
Bilirubin: NEGATIVE
Glucose, Ur: NEGATIVE mg/dL
Glucose: NEGATIVE mg/dL
Ketone: NEGATIVE mg/dL
Ketones, Urine: NEGATIVE mg/dL
Nitrite, Urine: NEGATIVE
Nitrites: NEGATIVE
Specific Gravity, UA: 1.025 (ref 1.003–1.030)
Specific gravity: 1.025 (ref 1.003–1.030)
Urobilinogen, UA, POCT: 0.2 EU/dL (ref 0.2–1.0)
Urobilinogen: 0.2 EU/dL (ref 0.2–1.0)
pH (UA): 5.5 (ref 5.0–8.0)
pH, UA: 5.5 (ref 5.0–8.0)

## 2021-10-29 ENCOUNTER — Ambulatory Visit: Payer: MEDICARE | Primary: Family

## 2021-10-29 NOTE — Telephone Encounter (Signed)
Spoke with the patient and she states she experienced heavy vaginal bleeding over the weekend.  She is scheduled for an ultrasound today at 10:00 at [facility].  Advised the patient Dr Su Hilt is not in the office today but that I would forward her message to him and he will be back in the office tomorrow.

## 2021-10-30 NOTE — Progress Notes (Signed)
Tell patient that the ultrasound of the pelvis and the transvaginal ultrasound show with thickened endometrium while lining of the uterus and that both ovaries are surgically removed.  She probably should see her GYN for further evaluation.

## 2021-10-31 ENCOUNTER — Encounter

## 2021-10-31 LAB — HEMOGLOBIN
HGB: 13 g/dL (ref 11.5–16.0)
Hemoglobin: 13 g/dL (ref 11.5–16.0)

## 2021-10-31 MED ORDER — PANTOPRAZOLE 40 MG TAB, DELAYED RELEASE
40 mg | ORAL_TABLET | ORAL | 6 refills | Status: AC
Start: 2021-10-31 — End: ?

## 2021-10-31 NOTE — Progress Notes (Signed)
Pt has upcoming appt for PMP bleeding  Hgb ordered- no recent one noted in chart.  TV US results in chart- stripe 11 mm otherwise normal, CT: Unremarkable

## 2021-10-31 NOTE — Telephone Encounter (Signed)
Spoke with the patient and advised her an order to have her hemoglobin has been entered and she may go to the hospital lab to have it drawn prior to her appointment on 11/06/21 with Dr Su Hilt.

## 2021-10-31 NOTE — Progress Notes (Signed)
Hgb level: Normal    Will have staff let the pt. know results and ok to keep appt. as scheduled for 11/8

## 2021-10-31 NOTE — Telephone Encounter (Signed)
Patient is questioning if she can be seen sooner than 11/06/21.  Advised her that is Dr Su Hilt first available appointment.

## 2021-11-01 NOTE — Telephone Encounter (Signed)
Patient returned call, I explained her EGD showed gastritis, no infection. Continue pantoprazole. May repeat ED as needed. She said ok.

## 2021-11-01 NOTE — Telephone Encounter (Signed)
-----   Message from Mallory Barbara, MD sent at 10/30/2021 11:56 PM EDT -----  Tell patient that the biopsies taken and her stomach were benign.  No infection was noted in the stomach.  Continue the pantoprazole 40 mg daily.  Repeat  esophageal dilatation as needed.

## 2021-11-01 NOTE — Telephone Encounter (Signed)
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  She probably should see her GYN for further evaluation.

## 2021-11-01 NOTE — Telephone Encounter (Signed)
Explained results to patient, she will see her GYN on 11-06-2021.

## 2021-11-01 NOTE — Telephone Encounter (Signed)
-----   Message from Dannette Barbara, MD sent at 10/30/2021 11:56 PM EDT -----  Tell patient that the biopsies taken and her stomach were benign.  No infection was noted in the stomach.  Continue the pantoprazole 40 mg daily.  Repeat  esophageal dilatation as needed.

## 2021-11-01 NOTE — Telephone Encounter (Signed)
-----   Message from Dannette Barbara, MD sent at 10/30/2021 11:50 PM EDT -----  Report of results with pelvic ultrasound

## 2021-11-01 NOTE — Telephone Encounter (Signed)
Results explained to patient, she is seeing GYN 11-06-2021

## 2021-11-01 NOTE — Telephone Encounter (Signed)
I CALLED Danelly, NO ANSWER,  phone message said not working number. I called and L/M for Patty to return my call, or have [facility] call.

## 2021-11-06 ENCOUNTER — Encounter: Attending: Obstetrics & Gynecology | Primary: Family

## 2021-11-06 MED ORDER — MEDROXYPROGESTERONE 10 MG TAB
10 mg | ORAL_TABLET | Freq: Every day | ORAL | 0 refills | Status: AC
Start: 2021-11-06 — End: ?

## 2021-11-06 NOTE — Progress Notes (Signed)
Embx results: Negative for malignancy or atypia  DWP  Instructed to finish provera   Then stop estrogen and will change to HRT( estrogen and progesterone)  Pt agrees

## 2021-11-06 NOTE — Progress Notes (Signed)
Mallory Bonilla is a No obstetric history on file., 55 y.o. female   No LMP recorded (lmp unknown). (Menstrual status: Menopause).    She presents for her problem    She is having  PMP bleeding,  .Has had a BSO in the past- was placed on HRT by MD in [place]- stopped taking provera daily and has been on Estrogen alone      Menstrual status:  Cycles are  menopausal .    Flow: moderate.      She does not have dysmenorrhea.      Medical conditions:  Since her last annual GYN exam about  ? years ago, she has not the following changes in her health history: none.     Mammogram History:    MAM Results (most recent):  Results from Hospital Encounter encounter on 03/21/20    MAM MAMMO BI SCREENING INCL CAD    Narrative  DIGITAL SCREENING BILATERAL MAMMOGRAM:    COMPARISON: 2019.    HISTORY: Screening.    NCI lifetime breast cancer risk 9.3%. Dondra Spry 5 year breast cancer risk 1.3%.    TECHNIQUE:  Computer Aided Detection (CAD) was used in evaluating this  mammogram.    FINDINGS:  Bilateral MLO and CC imaging performed. The breast parenchyma has  scattered fibroglandular densities. No suspicious calcifications, suspicious  masses, architectural distortion, skin thickening, or evident axillary  lymphadenopathy.    Impression  No mammographic evidence of malignancy.    RESULT CODE:  ACR BI-RADS Category 2, benign.    FOLLOWUP CODE: 1, 1 year (annual) mammographic followup.    According to the American Cancer Society, yearly mammograms are recommended  starting at age 69 and continuing, as long as, a woman is in good health.  Clinical breast exams should be part of a periodic health exam--about every 3  years for women in their 20's and 30's and every year for women 40 and over.  Breast self-exam is an option for women starting in their 20's.  Any breast  change noted on a breast self-exam should be reported promptly to the patient's  healthcare provider.  Breast MRI is recommended for women with an approximately  20-25% or  greater lifetime risk of breast cancer, including women with a strong  family history of breast or ovarian cancer and women who have been treated for  Hodgkin's disease.    A negative Mammography report should not discourage follow up or biopsy of a  clinically significant finding and/or abnormality.    Dense breast tissue may obscure small neoplasms.       DEXA Results (most recent):  No results found for this or any previous visit.         Past Medical History:   Diagnosis Date    Acid reflux 06/13/2021    Agoraphobia     Anxiety 01/11/2021    Anxiety     Arthritis     Bipolar 1 disorder (HCC) 01/11/2021    COVID-19 10/09/2021    Diabetes (HCC)     Diabetes mellitus type 2, controlled (HCC) 01/11/2021    Headache     Headache 01/11/2021    Hypercholesterolemia     Hyperlipidemia 01/11/2021    Hypertension     Hypertension 01/11/2021    PMB (postmenopausal bleeding)     Throat pain      Past Surgical History:   Procedure Laterality Date    COLONOSCOPY  05/30/2017    HX GYN      uterine ablasion  HX OOPHORECTOMY      HX ORTHOPAEDIC      foot surgery       Prior to Admission medications    Medication Sig Start Date End Date Taking? Authorizing Provider   medroxyPROGESTERone (PROVERA) 10 mg tablet Take 1 Tablet by mouth daily. 11/06/21  Yes [name], MD   pantoprazole (PROTONIX) 40 mg tablet TAKE 1 TABLET BY MOUTH ONCE DAILY FOR GASTROESOPHAGEAL REFLUX DISEASE. 10/30/21  Yes Dannette Barbara, MD   traZODone (DESYREL) 100 mg tablet TAKE 2 TABLETS BY MOUTH NIGHTLY AS NEEDED FOR SLEEP 09/13/21  Yes Allen-Blaine, Marylene Land, NP   ALPRAZolam (XANAX) 0.25 mg tablet TAKE 1 TO 2 TABLETS BY MOUTH ONCE DAILY AS NEEDED FOR SEVERE ANXIETY DURING TRIP 08/09/21  Yes Provider, Historical   ARIPiprazole (ABILIFY) 5 mg tablet Take 5 mg by mouth daily. 09/01/21  Yes Provider, Historical   True Metrix Glucose Test Strip strip USE 1 STRIP TO CHECK GLUCOSE TWICE DAILY 08/31/21  Yes Provider, Historical   vilazodone (VIIBRYD) 20 mg tab  tablet Take  by mouth daily.   Yes Other, Phys, MD   clonazePAM (KlonoPIN) 1 mg tablet Take 1 mg by mouth two (2) times a day.   Yes Other, Phys, MD   rOPINIRole (REQUIP) 0.25 mg tablet Take 0.25 mg by mouth three (3) times daily.   Yes Other, Phys, MD   rizatriptan (MAXALT) 5 mg tablet Take 5 mg by mouth once as needed for Migraine. May repeat in 2 hours if needed   Yes Other, Phys, MD   busPIRone (BUSPAR) 30 mg tablet TAKE 1 TABLET BY MOUTH TWICE DAILY FOR ANXIETY 07/30/21  Yes Allen-Blaine, Marylene Land, NP   metFORMIN (GLUCOPHAGE) 500 mg tablet Take 500 mg by mouth two (2) times a day. 05/29/21  Yes Provider, Historical   lisinopriL (PRINIVIL, ZESTRIL) 5 mg tablet Take 5 mg by mouth daily. 03/30/21  Yes Provider, Historical   dulaglutide (Trulicity) 0.75 mg/0.5 mL sub-q pen 0.75 mg by SubCUTAneous route every seven (7) days. monday   Yes Provider, Historical   Emgality Pen 120 mg/mL injection 120 mg by SubCUTAneous route every thirty (30) days. 03/23/21  Yes Provider, Historical   topiramate (Topamax) 50 mg tablet Take 1 Tablet by mouth nightly. Indications: migraine prevention 02/23/21  Yes Epstein, Steffanie A, NP   hydroCHLOROthiazide (HYDRODIURIL) 25 mg tablet Take 1 Tablet by mouth daily. Indications: high blood pressure 02/23/21  Yes Epstein, Steffanie A, NP   pravastatin (PRAVACHOL) 40 mg tablet Take 40 mg by mouth nightly.   Yes Other, Phys, MD   estradioL (ESTRACE) 2 mg tablet Take  by mouth daily.   Yes Other, Phys, MD   aspirin delayed-release 81 mg tablet Take 81 mg by mouth daily.   Yes Other, Phys, MD       No Known Allergies       Tobacco History:  reports that she has never smoked. She has never used smokeless tobacco.  Alcohol use:  reports that she does not currently use alcohol.  Drug use:  reports that she does not currently use drugs.    Family Medical/Cancer History:   Family History   Problem Relation Age of Onset    Diabetes Mother     Cancer Mother     Diabetes Sister     Heart Disease Sister      Cancer Sister     Diabetes Brother     Heart Disease Brother     Liver Disease Other  Macular Degen Other     Emphysema Father           Review of Systems   Constitutional:  Negative for chills, fever, malaise/fatigue and weight loss.   HENT:  Negative for congestion, ear pain, sinus pain and tinnitus.    Eyes:  Negative for blurred vision and double vision.   Respiratory:  Negative for cough, shortness of breath and wheezing.    Cardiovascular:  Negative for chest pain and palpitations.   Gastrointestinal:  Negative for abdominal pain, blood in stool, constipation, diarrhea, heartburn, nausea and vomiting.   Genitourinary:  Negative for dysuria, flank pain, frequency, hematuria and urgency.   Musculoskeletal:  Negative for joint pain and myalgias.   Skin:  Negative for itching and rash.   Neurological:  Negative for dizziness, weakness and headaches.   Psychiatric/Behavioral:  Negative for depression, memory loss and suicidal ideas. The patient is not nervous/anxious and does not have insomnia.        Physical Exam  Constitutional:       Appearance: Normal appearance.   HENT:      Head: Normocephalic and atraumatic.   Abdominal:      General: Abdomen is flat.      Palpations: Abdomen is soft.   Genitourinary:     General: Normal vulva.      Vagina: Normal.      Cervix: Normal.      Uterus: Normal.       Adnexa: Right adnexa normal and left adnexa normal.      Rectum: Normal.   Neurological:      Mental Status: She is alert.   Psychiatric:         Mood and Affect: Mood normal.         Behavior: Behavior normal.         Thought Content: Thought content normal.        Procedure note: Endometrial biopsy    Mallory Bonilla is a No obstetric history on file.,  55 y.o. female WHITE/NON-HISPANIC No LMP recorded (lmp unknown). (Menstrual status: Menopause).  The patient has a history of The encounter diagnosis was PMB (postmenopausal bleeding). and presents for an endometrial biopsy.   Indications:   After the indications,  risks, benefits, and alternatives to performing an endometrial biopsy were explained to the patient, her questions were answered and informed consent was obtained.   Procedure:  The patient was placed on the table in the dorsal lithotomy position.   A speculum was placed in the vagina. The cervix was visualized and prepped with zephrin. A tenaculum was  placed on the anterior lip of the cervix for traction. It was not necessary to dilate the cervix.   A pipelle was passed through the endocervical canal without difficulty. The uterus was sounded to 7 cm's. A small amount of tissue was returned. This tissue was placed in formalin and sent to pathology.   It was felt that an adequate sample was obtained.   The patient tolerated the procedure well and she reported mild cramping. The tenaculum and speculum were removed.   Post Procedural Status:  The patient was observed for 10 minutes after the procedure. She had mild cramping at the time of discharge. There were no complications.   The patient was discharged in stable condition.    Visit Vitals  BP 136/77 (BP 1 Location: Left upper arm, BP Patient Position: Sitting, BP Cuff Size: Small adult)   Ht 5\' 5"  (1.651 m)  Wt 196 lb 2 oz (89 kg)   LMP  (LMP Unknown)   BMI 32.64 kg/m??         Assessment: Diagnoses and all orders for this visit:    1. PMB (postmenopausal bleeding)  -     SURGICAL PATHOLOGY    Other orders  -     medroxyPROGESTERone (PROVERA) 10 mg tablet; Take 1 Tablet by mouth daily.      Plan: Questions addressed  Counseled re: diet, exercise, healthy lifestyle  Return for Annual  Rec annual mammogram

## 2021-11-06 NOTE — Progress Notes (Signed)
 Chief Complaint   Patient presents with    New Patient     PMB   Visit Vitals  BP 136/77 (BP 1 Location: Left upper arm, BP Patient Position: Sitting, BP Cuff Size: Small adult)   Ht 5' 5 (1.651 m)   Wt 196 lb 2 oz (89 kg)   LMP  (LMP Unknown)   BMI 32.64 kg/m

## 2021-11-09 LAB — SURGICAL PATHOLOGY

## 2021-11-12 MED ORDER — ESTRADIOL-NORETHINDRONE ACET 1 MG-0.5 MG TAB
ORAL_TABLET | Freq: Every day | ORAL | 1 refills | Status: DC
Start: 2021-11-12 — End: 2022-05-01

## 2021-11-19 NOTE — Telephone Encounter (Signed)
Patient called in stating that she was told that after taking Provera she would experience heavy bleeding due to thinning of the Endometrium. She hasn't experienced any bleeding and is a little concerned.

## 2021-11-21 NOTE — Progress Notes (Signed)
RTC to pt- questions addressed

## 2021-11-29 ENCOUNTER — Inpatient Hospital Stay: Admit: 2021-11-29 | Payer: MEDICARE | Primary: Family

## 2021-11-29 DIAGNOSIS — Z1231 Encounter for screening mammogram for malignant neoplasm of breast: Secondary | ICD-10-CM

## 2021-12-12 ENCOUNTER — Encounter: Payer: MEDICARE | Attending: Orthopaedic Surgery | Primary: Family

## 2022-01-07 NOTE — Telephone Encounter (Signed)
Patient called stating she is still bleeding on Activella.  Her bleeding is not heavy as she only needs to change her pad daily.  She was started on Provera initially and then switched to Activella in November.  Advised her Dr Su Hilt is currently seeing patients but her message would be forwarded to him.

## 2022-01-09 NOTE — Progress Notes (Signed)
RTC  Pt started on HRT in mid to late november- having no bleeding then will have a couple days that are really light    DWP- to give it another 4-6 weeks to see it will correct itself, if not then pt to call and can try mini-pill    Pt agrees

## 2022-01-21 ENCOUNTER — Encounter: Attending: Gastroenterology | Primary: Family

## 2022-04-22 ENCOUNTER — Encounter: Payer: MEDICARE | Attending: Orthopaedic Surgery | Primary: Family

## 2022-05-01 MED ORDER — ESTRADIOL-NORETHINDRONE ACET 1 MG-0.5 MG TAB
ORAL_TABLET | ORAL | 1 refills | Status: AC
Start: 2022-05-01 — End: ?

## 2022-05-07 ENCOUNTER — Encounter

## 2022-05-11 MED ORDER — PANTOPRAZOLE SODIUM 40 MG PO TBEC
40 MG | ORAL_TABLET | ORAL | 5 refills | Status: AC
Start: 2022-05-11 — End: 2022-11-25

## 2022-06-20 ENCOUNTER — Encounter: Payer: Self-pay | Admitting: *Deleted

## 2022-07-15 ENCOUNTER — Encounter

## 2022-07-18 ENCOUNTER — Inpatient Hospital Stay: Admit: 2022-07-18 | Payer: MEDICARE | Attending: Neurology | Primary: Family

## 2022-07-18 DIAGNOSIS — R251 Tremor, unspecified: Secondary | ICD-10-CM

## 2022-07-19 NOTE — Procedures (Signed)
[facility]  EEG    Name:  ALIZEE, MAPLE  MR#:  [id number]  DOB:  12/08/1966  ACCOUNT #:  [id number]  DATE OF SERVICE:  07/18/2022    DIAGNOSES:  Tremor.  The patient also has bipolar disorder.  The patient also has major depression.    DESCRIPTION:  The recording is done digitally on computer.  Electrodes are placed in a 10-20 international system.  Initial recording is equipment calibration followed by biocalibration.  The patient also has a right arm electrode.  Tracing started with the patient awake, eyes closed with well-formed bioccipital alpha rhythms in the 9 Hz frequency.  As the recording continues, the patient is hooked up to an EKG machine that shows a heart rate of 114.  Tracings continue with the patient being exposed to photic stimulation and hyperventilation without any changes.  The right arm tremor is showing a high-frequency amplitude.  Tracings continue with the patient having occasional eye motion artifact.    IMPRESSION:  This is a normal EEG, awake.  The patient's tremors are high-frequency tremor.  This makes it less likely to be parkinsonian.      Sallee Lange, MD      [id number]  D:  07/19/2022 10:38  Jaculin Rasmus:  07/19/2022 18:11  JOB #:  [id number]

## 2022-08-12 ENCOUNTER — Ambulatory Visit: Admit: 2022-08-12 | Discharge: 2022-08-16 | Payer: MEDICARE | Primary: Family

## 2022-08-12 ENCOUNTER — Encounter

## 2022-08-12 ENCOUNTER — Ambulatory Visit: Admit: 2022-08-12 | Discharge: 2022-08-12 | Payer: MEDICARE | Attending: Orthopaedic Surgery | Primary: Family

## 2022-08-12 DIAGNOSIS — M7551 Bursitis of right shoulder: Secondary | ICD-10-CM

## 2022-08-12 DIAGNOSIS — M25511 Pain in right shoulder: Secondary | ICD-10-CM

## 2022-08-12 MED ORDER — TRIAMCINOLONE ACETONIDE 40 MG/ML IJ SUSP
40 MG/ML | Freq: Once | INTRAMUSCULAR | Status: AC
Start: 2022-08-12 — End: 2022-08-12
  Administered 2022-08-12: 19:00:00 40 mg

## 2022-08-12 MED ORDER — LIDOCAINE HCL 1 % IJ SOLN
1 % | Freq: Once | INTRAMUSCULAR | Status: AC
Start: 2022-08-12 — End: 2022-08-12
  Administered 2022-08-12: 19:00:00 9 mL

## 2022-08-12 NOTE — Progress Notes (Signed)
Name: Mallory Bonilla    DOB: 1966/05/18     [facility]  [place] [facility]  [address], [name]  [place] [address]  Dept: [phone]  Dept Fax: [phone]     Chief Complaint   Patient presents with    Shoulder Pain        There were no vitals taken for this visit.     No Known Allergies     Current Outpatient Medications   Medication Sig Dispense Refill    amitriptyline (ELAVIL) 10 MG tablet       amoxicillin (AMOXIL) 875 MG tablet       amoxicillin-clavulanate (AUGMENTIN) 875-125 MG per tablet       azithromycin (ZITHROMAX) 250 MG tablet       Canagliflozin-metFORMIN HCl ER (INVOKAMET XR) 150-500 MG TB24 Take 1 tablet by mouth 2 times daily      cyclobenzaprine (FLEXERIL) 10 MG tablet       clindamycin (CLEOCIN) 300 MG capsule       cephALEXin (KEFLEX) 500 MG capsule       carbidopa-levodopa (SINEMET) 10-100 MG per tablet Take 1 tablet by mouth 3 times daily      diclofenac sodium (VOLTAREN) 1 % GEL       fluconazole (DIFLUCAN) 100 MG tablet       TRUE METRIX BLOOD GLUCOSE TEST strip USE 1 STRIP TO CHECK GLUCOSE TWICE DAILY      glipiZIDE (GLUCOTROL) 5 MG tablet       hydrOXYzine HCl (ATARAX) 25 MG tablet       ibuprofen (ADVIL;MOTRIN) 800 MG tablet       methocarbamol (ROBAXIN) 750 MG tablet       lisinopril-hydroCHLOROthiazide (PRINZIDE;ZESTORETIC) 20-25 MG per tablet       linagliptin (TRADJENTA) 5 MG tablet       influenza quadrivalent split vaccine (FLUZONE;FLUARIX;FLULAVAL;AFLURIA) 0.5 ML injection PHARMACIST ADMINISTERED IMMUNIZATION ADMINISTERED AT TIME OF DISPENSING      metoprolol tartrate (LOPRESSOR) 25 MG tablet       ondansetron (ZOFRAN-ODT) 4 MG disintegrating tablet       venlafaxine (EFFEXOR XR) 37.5 MG extended release capsule       traMADol (ULTRAM) 50 MG tablet       sulfamethoxazole-trimethoprim (BACTRIM DS;SEPTRA DS) 800-160 MG per tablet       promethazine (PHENERGAN) 25 MG tablet       predniSONE (DELTASONE) 10 MG  tablet       oseltamivir (TAMIFLU) 75 MG capsule       ondansetron (ZOFRAN) 4 MG tablet       pantoprazole (PROTONIX) 40 MG tablet Take 1 tablet by mouth once daily 30 tablet 5    ALPRAZolam (XANAX) 0.25 MG tablet TAKE 1 TO 2 TABLETS BY MOUTH ONCE DAILY AS NEEDED FOR SEVERE ANXIETY DURING TRIP      ARIPiprazole (ABILIFY) 5 MG tablet Take 5 mg by mouth daily      aspirin 81 MG EC tablet Take 81 mg by mouth daily      busPIRone (BUSPAR) 30 MG tablet TAKE 1 TABLET BY MOUTH TWICE DAILY FOR ANXIETY      clonazePAM (KLONOPIN) 1 MG tablet Take 1 mg by mouth 2 times daily.      Dulaglutide (TRULICITY) 0.75 MG/0.5ML SOPN Inject 0.75 mg into the skin every 7 days      estradiol (ESTRACE) 2 MG tablet Take by mouth daily  estradiol-norethindrone (ACTIVELLA) 1-0.5 MG per tablet Take 1 tablet by mouth daily      Galcanezumab-gnlm (EMGALITY) 120 MG/ML SOAJ Inject 120 mg into the skin every 30 days      hydroCHLOROthiazide (HYDRODIURIL) 25 MG tablet Take 25 mg by mouth daily      lisinopril (PRINIVIL;ZESTRIL) 5 MG tablet Take 5 mg by mouth daily      medroxyPROGESTERone (PROVERA) 10 MG tablet Take 10 mg by mouth daily      metFORMIN (GLUCOPHAGE) 500 MG tablet Take 500 mg by mouth 2 times daily      pravastatin (PRAVACHOL) 40 MG tablet Take 40 mg by mouth      rizatriptan (MAXALT) 5 MG tablet Take 5 mg by mouth once as needed      rOPINIRole (REQUIP) 0.25 MG tablet Take 0.25 mg by mouth 3 times daily      topiramate (TOPAMAX) 50 MG tablet Take 50 mg by mouth      traZODone (DESYREL) 100 MG tablet TAKE 2 TABLETS BY MOUTH NIGHTLY AS NEEDED FOR SLEEP      vilazodone HCl (VIIBRYD) 20 MG TABS Take by mouth daily       No current facility-administered medications for this visit.      Patient Active Problem List   Diagnosis    Bipolar 1 disorder (HCC)    Headache    Hyperlipidemia    Hypertension    Anxiety    Diabetes mellitus type 2, controlled (HCC)    MDD (major depressive disorder)    Agoraphobia    Acid reflux    PMB  (postmenopausal bleeding)      Family History   Problem Relation Age of Onset    Cancer Mother     Diabetes Mother     Cancer Sister     Diabetes Brother     Heart Disease Sister     Liver Disease Other     Heart Disease Brother     Emphysema Father     Diabetes Sister     Macular Degen Other        Past Surgical History:   Procedure Laterality Date    COLONOSCOPY  05/30/2017    GYN      uterine ablasion    ORTHOPEDIC SURGERY      foot surgery    OVARY REMOVAL        Past Medical History:   Diagnosis Date    Acid reflux 06/13/2021    Agoraphobia     Anxiety 01/11/2021    Anxiety     Arthritis     Bipolar 1 disorder (HCC) 01/11/2021    COVID-19 10/09/2021    Diabetes (HCC)     Diabetes mellitus type 2, controlled (HCC) 01/11/2021    Headache     Headache 01/11/2021    Hypercholesterolemia     Hyperlipidemia 01/11/2021    Hypertension     Hypertension 01/11/2021    Menopause     PMB (postmenopausal bleeding)     Throat pain         I have reviewed and agree with PFSH and ROS and intake form in chart and the record furthermore I have reviewed prior medical record(s) regarding this patients care during this appointment.     Review of Systems:   Patient is a pleasant appearing individual, appropriately dressed, well hydrated, well nourished, who is alert, appropriately oriented for age, and in no acute distress with a normal gait and normal affect who  does not appear to be in any significant pain.    Physical Exam:  Right Shoulder - Grossly neurovascularly intact. Range of motion-Full passive, Active with impingement. No Point tenderness, Strength-weakness with abduction, some mild crepitation, No skin lesion are identified, No instabilty is noted, No apprehension. No Swelling.     Left Shoulder - Grossly neurovascularly intact, Full Range of motion, No point tenderness, No weakness, No skin lesions, No Instability, No apprehension, No swelling.     Procedure Documentation:    I discussed in detail the risks, benefits  and complications of an injection which included but are not limited to infection, skin reactions, hot swollen joint, and anaphylaxis with the patient. The patient verbalized understanding and gave informed consent for the injection. The patient's right shoulder were prepped using sterile alcohol solution. A sterile needle was inserted into the right shoulder and the mixture of 9 mL Lidocaine 1%, 1 mL Kenalog 40 mg was injected under sterile technique. The needle was withdrawn and the puncture site sealed with a Band-Aid.      Technique: Under sterile conditions a GE ultrasound unit with a variable frequency (7.0-14.0 MHz) linear transducer was used to localize the placement of needle into the right joint.    Findings: Successful needle placement for shoulder injection.  Final images were taken and saved for permanent record.      The patient tolerated the injection well. The patient was instructed to call the office immediately if there is any pain, redness, warmth, fever, or chills.    Visit Diagnoses         Codes    Bursitis of right shoulder    -  Primary M75.51    Right shoulder pain, unspecified chronicity     M25.511               HPI:  The patient is here with a chief complaint of right shoulder pain, throbbing, burning pain, progressively getting worse.  Pain is 5/10.    X-rays of the right shoulder are unremarkable done in our office, AP and wide view.    Assessment/Plan:  1.  Right shoulder frozen shoulder.    Plan will be for physical therapy, activities as tolerated started, no restrictions from my standpoint.  We will see the patient back as needed or in 4 to 6 weeks post physical therapy.  If not better, we may consider treatment options.  We will do cortisone injection right shoulder and go from there.     As part of continued conservative pain management options the patient was advised to utilize Tylenol or OTC NSAIDS as long as it is not medically contraindicated.     Return to Office:     Follow-up and Dispositions    Return in about 6 weeks (around 09/23/2022).        Scribed by Diona Foley, LPN as dictated by [facility] A. Allena Katz, MD.  Documentation, performed by, True and Accepted Deziree Mokry A. Allena Katz, MD

## 2022-08-12 NOTE — Patient Instructions (Signed)
Shoulder Bursitis: Care Instructions  Overview     Bursitis is inflammation of the bursa. A bursa is a small sac of fluid that cushions a joint and helps it move easily. A bursa sits under the highest point of your shoulder. You can get bursitis by overusing your shoulder, which can happen with activities such as lifting, pitching a ball, or painting. Symptoms of bursitis include pain when you move your arm. Your arm may hurt when you try to lift it, and the pain can reach down the side of your arm. You may have trouble sleeping because of the pain.  Bursitis usually gets better if you avoid the activity that caused it. If pain lasts or gets worse despite home treatment, your doctor may draw fluid from the bursa through a needle. This may relieve your pain and help your doctor know if you have an infection. If so, your doctor will prescribe antibiotics. If you have inflammation only, you may get a corticosteroid shot to reduce swelling and pain. Sometimes surgery is needed to drain or remove the bursa.  Follow-up care is a key part of your treatment and safety. Be sure to make and go to all appointments, and call your doctor if you are having problems. It's also a good idea to know your test results and keep a list of the medicines you take.  How can you care for yourself at home?  Put ice or a cold pack on your shoulder for 10 to 20 minutes at a time. Put a thin cloth between the ice and your skin.  After 3 days of using ice, use heat on your shoulder. You can use a hot water bottle, a heating pad set on low, or a warm, moist towel. Some doctors suggest alternating between hot and cold.  Rest your shoulder. Stop any activities that cause pain. Switch to activities that do not stress your shoulder.  Take your medicines exactly as prescribed. Call your doctor if you think you are having a problem with your medicine.  If your doctor recommends it, take anti-inflammatory medicines to reduce pain. These include  ibuprofen (Advil, Motrin) and naproxen (Aleve). Read and follow all instructions on the label.  To prevent stiffness, gently move the shoulder joint through its full range of motion. As the pain gets better, keep doing range-of-motion exercises. Ask your doctor for exercises that will make the muscles around the shoulder joint stronger. Do these as directed.  You can slowly return to the activity that caused the pain, but do it with less effort until you can do it without pain or swelling. Be sure to warm up before and stretch after you do the activity.  When should you call for help?   Call your doctor now or seek immediate medical care if:    You have a fever.     You have increased swelling or redness in your shoulder.     You cannot use your shoulder, or the pain in your shoulder gets worse.   Watch closely for changes in your health, and be sure to contact your doctor if:    You have pain for 2 weeks or longer despite home treatment.   Where can you learn more?  Go to https://www.healthwise.net/patientEd and enter M955 to learn more about "Shoulder Bursitis: Care Instructions."  Current as of: March 07, 2021               Content Version: 13.5   2006-2022 [employer].     Care instructions adapted under license by [facility]. If you have questions about a medical condition or this instruction, always ask your healthcare professional. [employer] disclaims any warranty or liability for your use of this information.

## 2022-08-19 ENCOUNTER — Encounter: Payer: MEDICARE | Primary: Family

## 2022-08-26 MED ORDER — ESTRADIOL-NORETHINDRONE ACET 1-0.5 MG PO TABS
1-0.5 MG | ORAL_TABLET | ORAL | 1 refills | Status: DC
Start: 2022-08-26 — End: 2023-03-03

## 2022-10-11 ENCOUNTER — Ambulatory Visit: Admit: 2022-10-11 | Discharge: 2022-10-11 | Payer: MEDICARE | Attending: Obstetrics & Gynecology | Primary: Family

## 2022-10-11 DIAGNOSIS — N950 Postmenopausal bleeding: Secondary | ICD-10-CM

## 2022-10-11 NOTE — Progress Notes (Signed)
Mallory Bonilla is a G65P2000, 56 y.o. female   No LMP recorded. (Menstrual status: Menopause).    She presents for her problem    She is having  PMP bleeding while on HRT- had some dark old blood then red for several days and then tapered and stopped none since .    Had [place] last time this occurred- thickened lining, EMBX: benign, showed atrophy    Menstrual status:  Cycles are menopausal.    Flow: PMPB.      She does not have dysmenorrhea.      Medical conditions:  Since her last annual GYN exam about one year ago, she has not the following changes in her health history: none.     Mammogram History:    MAM Results (most recent):  @BSHSILASTIMGCAT (IMG3020:1)@     DEXA Results (most recent):  @BSHSILASTIMGCAT (FUX3235:5)@       Past Medical History:   Diagnosis Date    Acid reflux 06/13/2021    Agoraphobia     Anxiety 01/11/2021    Anxiety     Arthritis     Bipolar 1 disorder ([place]) 01/11/2021    COVID-19 10/09/2021    Diabetes ([place])     Diabetes mellitus type 2, controlled ([place]) 01/11/2021    Headache     Headache 01/11/2021    Hypercholesterolemia     Hyperlipidemia 01/11/2021    Hypertension     Hypertension 01/11/2021    Menopause     PMB (postmenopausal bleeding)     Throat pain      Past Surgical History:   Procedure Laterality Date    COLONOSCOPY  05/30/2017    GYN      uterine ablasion    ORTHOPEDIC SURGERY      foot surgery    OVARY REMOVAL         Prior to Admission medications    Medication Sig Start Date End Date Taking? Authorizing Provider   amitriptyline (ELAVIL) 10 MG tablet    Yes [provider]   Canagliflozin-metFORMIN HCl ER (INVOKAMET XR) 150-500 MG TB24 Take 1 tablet by mouth 2 times daily   Yes [provider]   carbidopa-levodopa (SINEMET) 10-100 MG per tablet Take 1 tablet by mouth 3 times daily 07/15/22  Yes [provider]   methocarbamol (ROBAXIN) 750 MG tablet    Yes [provider]   traMADol (ULTRAM) 50 MG tablet    Yes [provider]    ALPRAZolam (XANAX) 0.25 MG tablet TAKE 1 TO 2 TABLETS BY MOUTH ONCE DAILY AS NEEDED FOR SEVERE ANXIETY DURING TRIP 08/09/21  Yes Automatic Reconciliation, Ar   ARIPiprazole (ABILIFY) 5 MG tablet Take 1 tablet by mouth daily 09/01/21  Yes Automatic Reconciliation, Ar   aspirin 81 MG EC tablet Take 1 tablet by mouth daily   Yes Automatic Reconciliation, Ar   busPIRone (BUSPAR) 30 MG tablet TAKE 1 TABLET BY MOUTH TWICE DAILY FOR ANXIETY 07/30/21  Yes Automatic Reconciliation, Ar   clonazePAM (KLONOPIN) 1 MG tablet Take 1 tablet by mouth 2 times daily.   Yes Automatic Reconciliation, Ar   Dulaglutide (TRULICITY) 7.32 KG/2.5KY SOPN Inject 0.75 mg into the skin every 7 days   Yes Automatic Reconciliation, Ar   estradiol (ESTRACE) 2 MG tablet Take by mouth daily   Yes Automatic Reconciliation, Ar   Galcanezumab-gnlm (EMGALITY) 120 MG/ML SOAJ Inject 120 mg into the skin every 30 days 03/23/21  Yes Automatic Reconciliation, Ar   lisinopril (PRINIVIL;ZESTRIL) 5 MG tablet  Take 1 tablet by mouth daily 03/30/21  Yes Automatic Reconciliation, Ar   metFORMIN (GLUCOPHAGE) 500 MG tablet Take 1 tablet by mouth 2 times daily 05/29/21  Yes Automatic Reconciliation, Ar   pravastatin (PRAVACHOL) 40 MG tablet Take 1 tablet by mouth   Yes Automatic Reconciliation, Ar   rizatriptan (MAXALT) 5 MG tablet Take 1 tablet by mouth once as needed   Yes Automatic Reconciliation, Ar   rOPINIRole (REQUIP) 0.25 MG tablet Take 1 tablet by mouth 3 times daily   Yes Automatic Reconciliation, Ar   traZODone (DESYREL) 100 MG tablet TAKE 2 TABLETS BY MOUTH NIGHTLY AS NEEDED FOR SLEEP 09/13/21  Yes Automatic Reconciliation, Ar   estradiol-norethindrone (ACTIVELLA) 1-0.5 MG per tablet Take 1 tablet by mouth once daily 08/26/22   [name], MD   amoxicillin (AMOXIL) 875 MG tablet     [provider]   amoxicillin-clavulanate (AUGMENTIN) 875-125 MG per tablet     [provider]   azithromycin (ZITHROMAX) 250 MG tablet     [provider]    cyclobenzaprine (FLEXERIL) 10 MG tablet     [provider]   clindamycin (CLEOCIN) 300 MG capsule     [provider]   cephALEXin (KEFLEX) 500 MG capsule     [provider]   diclofenac sodium (VOLTAREN) 1 % GEL     [provider]   fluconazole (DIFLUCAN) 100 MG tablet     [provider]   TRUE METRIX BLOOD GLUCOSE TEST strip USE 1 STRIP TO CHECK GLUCOSE TWICE DAILY 07/26/22   [provider]   glipiZIDE (GLUCOTROL) 5 MG tablet     [provider]   hydrOXYzine HCl (ATARAX) 25 MG tablet     [provider]   ibuprofen (ADVIL;MOTRIN) 800 MG tablet     [provider]   lisinopril-hydroCHLOROthiazide (PRINZIDE;ZESTORETIC) 20-25 MG per tablet     [provider]   linagliptin (TRADJENTA) 5 MG tablet     [provider]   influenza quadrivalent split vaccine (FLUZONE;FLUARIX;FLULAVAL;AFLURIA) 0.5 ML injection PHARMACIST ADMINISTERED IMMUNIZATION ADMINISTERED AT TIME OF DISPENSING    [provider]   metoprolol tartrate (LOPRESSOR) 25 MG tablet     [provider]   ondansetron (ZOFRAN-ODT) 4 MG disintegrating tablet     [provider]   venlafaxine (EFFEXOR XR) 37.5 MG extended release capsule     [provider]   sulfamethoxazole-trimethoprim (BACTRIM DS;SEPTRA DS) 800-160 MG per tablet     [provider]   promethazine (PHENERGAN) 25 MG tablet     [provider]   predniSONE (DELTASONE) 10 MG tablet     [provider]   oseltamivir (TAMIFLU) 75 MG capsule     [provider]   ondansetron (ZOFRAN) 4 MG tablet     [provider]   pantoprazole (PROTONIX) 40 MG tablet Take 1 tablet by mouth once daily 05/11/22   Knox Royalty., MD   hydroCHLOROthiazide (HYDRODIURIL) 25 MG tablet Take 25 mg by mouth daily 02/23/21   Automatic Reconciliation, Ar   medroxyPROGESTERone (PROVERA) 10 MG tablet Take 10 mg by mouth daily 11/06/21    Automatic Reconciliation, Ar   vilazodone HCl (VIIBRYD) 20 MG TABS Take by mouth daily    Automatic Reconciliation, Ar       No Known Allergies       Tobacco History:  reports that she has never smoked. She has never used smokeless tobacco.  Alcohol  use:  reports that she does not currently use alcohol.  Drug use:  reports that she does not currently use drugs.    Family Medical/Cancer History:   Family History   Problem Relation Age of Onset    Cancer Mother     Diabetes Mother     Cancer Sister     Diabetes Brother     Heart Disease Sister     Liver Disease Other     Heart Disease Brother     Emphysema Father     Diabetes Sister     Macular Degen Other         Review of Systems   Constitutional: Negative.    Respiratory: Negative.     Cardiovascular: Negative.    Gastrointestinal: Negative.    Genitourinary: Negative.    Musculoskeletal: Negative.    Neurological: Negative.    Psychiatric/Behavioral: Negative.           BP (!) 144/68 (Site: Left Upper Arm, Position: Sitting, Cuff Size: Medium Adult)   Pulse 88   Temp 98 F (36.7 C)   Resp 18   Ht 5\' 5"  (1.651 m)   Wt 199 lb 5 oz (90.4 kg)   SpO2 98%   BMI 33.17 kg/m     Physical Exam  Genitourinary:     General: Normal vulva.      Vagina: Normal.      Cervix: Normal.      Adnexa: Right adnexa normal and left adnexa normal.      Comments: After verbal consent , Speculum placed, the cervix was cleaned with betadine, anterior lip grasped with single tooth tenaculum, endocervix dilated, using a pipelle the uterus sounded to 7 cms, several passes made with minimal tissue returned, pt tolerated the procedure without issues.             Assessment: Nataline was seen today for other.    Diagnoses and all orders for this visit:    PMB (postmenopausal bleeding)    Screening mammogram for breast cancer  -     MAM TOMO DIGITAL SCREEN BILATERAL; Future    F/up PATH  Annual exam needs to be scheduled    Plan: Questions addressed  Counseled re: diet, exercise, healthy  lifestyle  Return for Annual  Rec annual mammogram  Rec: Colonoscopy

## 2022-10-11 NOTE — Addendum Note (Signed)
Addended by: Vanetta Shawl on: 10/11/2022 10:25 AM     Modules accepted: Orders

## 2022-10-16 LAB — SURGICAL PATHOLOGY

## 2022-11-24 ENCOUNTER — Encounter

## 2022-11-25 MED ORDER — PANTOPRAZOLE SODIUM 40 MG PO TBEC
40 MG | ORAL_TABLET | ORAL | 11 refills | Status: AC
Start: 2022-11-25 — End: ?

## 2023-03-03 MED ORDER — ESTRADIOL-NORETHINDRONE ACET 1-0.5 MG PO TABS
1-0.5 MG | ORAL_TABLET | ORAL | 0 refills | Status: AC
Start: 2023-03-03 — End: 2023-05-29

## 2023-05-29 MED ORDER — ESTRADIOL-NORETHINDRONE ACET 1-0.5 MG PO TABS
ORAL_TABLET | ORAL | 0 refills | Status: DC
Start: 2023-05-29 — End: 2023-07-22

## 2023-07-22 ENCOUNTER — Ambulatory Visit: Admit: 2023-07-22 | Discharge: 2023-07-22 | Payer: MEDICARE | Attending: Obstetrics & Gynecology | Primary: Family

## 2023-07-22 DIAGNOSIS — N959 Unspecified menopausal and perimenopausal disorder: Secondary | ICD-10-CM

## 2023-07-22 MED ORDER — ESTRADIOL-NORETHINDRONE ACET 1-0.5 MG PO TABS
1-0.5 MG | ORAL_TABLET | Freq: Every day | ORAL | 3 refills | Status: AC
Start: 2023-07-22 — End: ?

## 2023-07-22 NOTE — Progress Notes (Signed)
Chief Complaint   Patient presents with    Annual Exam     Mammo-11-30-21  Dexa-?     BP 134/76 (Site: Left Upper Arm, Position: Sitting, Cuff Size: Small Adult)   Ht 1.651 m (5\' 5" )   Wt 89.8 kg (198 lb)   BMI 32.95 kg/m

## 2023-07-22 NOTE — Progress Notes (Signed)
Mallory Bonilla is a G79P2000, 57 y.o. female   No LMP recorded. (Menstrual status: Menopause).    She presents for her annual    She is having no significant problems.      Menstrual status:  Cycles are menopausal.    Flow: absent.      She does not have dysmenorrhea.      Medical conditions:  Since her last annual GYN exam about one year ago, she has not the following changes in her health history: none.     Mammogram History:    MAM Results (most recent):  @BSHSILASTIMGCAT (IMG3020:1)@     DEXA Results (most recent):  @BSHSILASTIMGCAT ([id number]:5)@       Past Medical History:   Diagnosis Date    Acid reflux 06/13/2021    Agoraphobia     Anxiety 01/11/2021    Anxiety     Arthritis     Bipolar 1 disorder ([facility]) 01/11/2021    COVID-19 10/09/2021    Diabetes ([facility])     Diabetes mellitus type 2, controlled ([facility]) 01/11/2021    Headache     Headache 01/11/2021    Hypercholesterolemia     Hyperlipidemia 01/11/2021    Hypertension     Hypertension 01/11/2021    Menopause     PMB (postmenopausal bleeding)     Throat pain      Past Surgical History:   Procedure Laterality Date    COLONOSCOPY  05/30/2017    GYN      uterine ablasion    ORTHOPEDIC SURGERY      foot surgery    OVARY REMOVAL         Prior to Admission medications    Medication Sig Start Date End Date Taking? Authorizing Provider   estradiol-norethindrone (ACTIVELLA) 1-0.5 MG per tablet Take 1 tablet by mouth daily 07/22/23  Yes [name], MD   pantoprazole (PROTONIX) 40 MG tablet Take 1 tablet by mouth once daily 11/25/22  Yes Knox Royalty., MD   amitriptyline (ELAVIL) 10 MG tablet    Yes [provider]   Canagliflozin-metFORMIN HCl ER (INVOKAMET XR) 150-500 MG TB24 Take 1 tablet by mouth 2 times daily   Yes [provider]   cyclobenzaprine (FLEXERIL) 10 MG tablet    Yes [provider]   carbidopa-levodopa (SINEMET) 10-100 MG per tablet Take 1 tablet by mouth 3 times daily 07/15/22  Yes [provider]   diclofenac  sodium (VOLTAREN) 1 % GEL    Yes [provider]   TRUE METRIX BLOOD GLUCOSE TEST strip USE 1 STRIP TO CHECK GLUCOSE TWICE DAILY 07/26/22  Yes [provider]   glipiZIDE (GLUCOTROL) 5 MG tablet    Yes [provider]   hydrOXYzine HCl (ATARAX) 25 MG tablet    Yes [provider]   ibuprofen (ADVIL;MOTRIN) 800 MG tablet    Yes [provider]   methocarbamol (ROBAXIN) 750 MG tablet    Yes [provider]   lisinopril-hydroCHLOROthiazide (PRINZIDE;ZESTORETIC) 20-25 MG per tablet    Yes [provider]   linagliptin (TRADJENTA) 5 MG tablet    Yes [provider]   influenza quadrivalent split vaccine (FLUZONE;FLUARIX;FLULAVAL;AFLURIA) 0.5 ML injection PHARMACIST ADMINISTERED IMMUNIZATION ADMINISTERED AT TIME OF DISPENSING   Yes [provider]   metoprolol tartrate (LOPRESSOR) 25 MG tablet    Yes [provider]   ondansetron (ZOFRAN-ODT) 4 MG disintegrating tablet    Yes [provider]   venlafaxine (EFFEXOR XR) 37.5 MG extended  release capsule    Yes [provider]   traMADol (ULTRAM) 50 MG tablet    Yes [provider]   promethazine (PHENERGAN) 25 MG tablet    Yes [provider]   predniSONE (DELTASONE) 10 MG tablet    Yes [provider]   ondansetron (ZOFRAN) 4 MG tablet    Yes [provider]   ALPRAZolam (XANAX) 0.25 MG tablet TAKE 1 TO 2 TABLETS BY MOUTH ONCE DAILY AS NEEDED FOR SEVERE ANXIETY DURING TRIP 08/09/21  Yes Automatic Reconciliation, Ar   ARIPiprazole (ABILIFY) 5 MG tablet Take 1 tablet by mouth daily 09/01/21  Yes Automatic Reconciliation, Ar   aspirin 81 MG EC tablet Take 1 tablet by mouth daily   Yes Automatic Reconciliation, Ar   busPIRone (BUSPAR) 30 MG tablet TAKE 1 TABLET BY MOUTH TWICE DAILY FOR ANXIETY 07/30/21  Yes Automatic Reconciliation, Ar   clonazePAM (KLONOPIN) 1 MG tablet Take 1 tablet by mouth 2 times daily.   Yes Automatic Reconciliation, Ar    Dulaglutide (TRULICITY) 0.75 MG/0.5ML SOPN Inject 0.5 mLs into the skin every 7 days   Yes Automatic Reconciliation, Ar   estradiol (ESTRACE) 2 MG tablet Take by mouth daily   Yes Automatic Reconciliation, Ar   Galcanezumab-gnlm (EMGALITY) 120 MG/ML SOAJ Inject 120 mg into the skin every 30 days 03/23/21  Yes Automatic Reconciliation, Ar   hydroCHLOROthiazide (HYDRODIURIL) 25 MG tablet Take 1 tablet by mouth daily 02/23/21  Yes Automatic Reconciliation, Ar   lisinopril (PRINIVIL;ZESTRIL) 5 MG tablet Take 1 tablet by mouth daily 03/30/21  Yes Automatic Reconciliation, Ar   medroxyPROGESTERone (PROVERA) 10 MG tablet Take 1 tablet by mouth daily 11/06/21  Yes Automatic Reconciliation, Ar   metFORMIN (GLUCOPHAGE) 500 MG tablet Take 1 tablet by mouth 2 times daily 05/29/21  Yes Automatic Reconciliation, Ar   pravastatin (PRAVACHOL) 40 MG tablet Take 1 tablet by mouth   Yes Automatic Reconciliation, Ar   rizatriptan (MAXALT) 5 MG tablet Take 1 tablet by mouth once as needed   Yes Automatic Reconciliation, Ar   rOPINIRole (REQUIP) 0.25 MG tablet Take 1 tablet by mouth 3 times daily   Yes Automatic Reconciliation, Ar   traZODone (DESYREL) 100 MG tablet TAKE 2 TABLETS BY MOUTH NIGHTLY AS NEEDED FOR SLEEP 09/13/21  Yes Automatic Reconciliation, Ar   vilazodone HCl (VIIBRYD) 20 MG TABS Take by mouth daily   Yes Automatic Reconciliation, Ar       No Known Allergies       Tobacco History:  reports that she has never smoked. She has never used smokeless tobacco.  Alcohol use:  reports that she does not currently use alcohol.  Drug use:  reports that she does not currently use drugs.    Family Medical/Cancer History:   Family History   Problem Relation Age of Onset    Cancer Mother     Diabetes Mother     Cancer Sister     Diabetes Brother     Heart Disease Sister     Liver Disease Other     Heart Disease Brother     Emphysema Father     Diabetes Sister     Macular Degen Other         Review of Systems   Constitutional: Negative.     Respiratory: Negative.     Cardiovascular: Negative.    Gastrointestinal: Negative.    Genitourinary: Negative.    Musculoskeletal: Negative.    Neurological: Negative.  Psychiatric/Behavioral: Negative.           BP 134/76 (Site: Left Upper Arm, Position: Sitting, Cuff Size: Small Adult)   Ht 1.651 m (5\' 5" )   Wt 89.8 kg (198 lb)   BMI 32.95 kg/m     Physical Exam  Constitutional:       Appearance: Normal appearance.   Cardiovascular:      Rate and Rhythm: Normal rate and regular rhythm.   Pulmonary:      Effort: Pulmonary effort is normal.      Breath sounds: Normal breath sounds.   Chest:   Breasts:     Right: Normal.      Left: Normal.   Abdominal:      General: Abdomen is flat.      Palpations: Abdomen is soft.   Genitourinary:     General: Normal vulva.      Vagina: Normal.      Cervix: Normal.      Uterus: Normal.       Adnexa: Right adnexa normal and left adnexa normal.      Rectum: Normal.      Comments: Pap obtained  Urethra normal  Atrophy present  Musculoskeletal:         General: Normal range of motion.   Neurological:      General: No focal deficit present.      Mental Status: She is alert and oriented to person, place, and time. Mental status is at baseline.   Psychiatric:         Mood and Affect: Mood normal.         Behavior: Behavior normal.         Thought Content: Thought content normal.         Judgment: Judgment normal.              Assessment: Kelsha was seen today for annual exam.    Diagnoses and all orders for this visit:    Menopausal problem  -     DEXA BONE DENSITY AXIAL SKELETON; Future    Pap smear for cervical cancer screening  -     PAP LB, Reflex HPV ASCUS ([id number]) ([facility])    Screening mammogram for breast cancer  -     MAM TOMO DIGITAL SCREEN BILATERAL; Future    Gynecologic exam normal    BMI 32.0-32.9,adult    Other orders  -     estradiol-norethindrone (ACTIVELLA) 1-0.5 MG per tablet; Take 1 tablet by mouth daily        Plan: Questions addressed  Counseled re: diet,  exercise, healthy lifestyle  Return for Annual  Rec annual mammogram  Rec: DEXA  Rec: [place]  Rec: Colonoscopy

## 2023-07-23 LAB — PAP LB, REFLEX HPV ASCUS (199300): .: 0

## 2023-07-24 ENCOUNTER — Ambulatory Visit: Payer: MEDICARE | Primary: Family

## 2023-07-24 ENCOUNTER — Inpatient Hospital Stay: Admit: 2023-07-24 | Payer: MEDICARE | Primary: Family

## 2023-07-24 DIAGNOSIS — Z1231 Encounter for screening mammogram for malignant neoplasm of breast: Secondary | ICD-10-CM

## 2023-07-29 ENCOUNTER — Encounter

## 2023-09-22 ENCOUNTER — Ambulatory Visit: Admit: 2023-09-22 | Discharge: 2023-09-22 | Payer: MEDICARE | Attending: Gastroenterology | Primary: Family

## 2023-09-22 ENCOUNTER — Encounter

## 2023-09-22 VITALS — BP 140/70 | HR 93 | Temp 97.50000°F | Resp 14 | Ht 65.0 in | Wt 198.0 lb

## 2023-09-22 DIAGNOSIS — R1319 Other dysphagia: Secondary | ICD-10-CM

## 2023-09-22 NOTE — Progress Notes (Signed)
 1. Have you been to the ER, urgent care clinic since your last visit?  Hospitalized since your last visit? YES 08-18-23   HAD CT SCAN  GASTRITIS    2. Have you seen or consulted any other health care providers outside of the [facility] since your last visit?  Include any pap smears or colon screening.  NO   Chief Complaint   Patient presents with    problems swallowing     For 1 to 2 months.    HAD EGD 06-29-21    WENT TO ER 08-18-23  HAD CT SCAN    Follow-up    Gastroesophageal Reflux     BURNING SENSATION IN THROAT AFTER EATING RECENTLY    HOARSENESS     A LOT LATLEY    Hiccups     BP (!) 140/70 (Site: Left Upper Arm, Position: Sitting, Cuff Size: Medium Adult)   Pulse 93   Temp 97.5 F (36.4 C) (Temporal)   Resp 14   Ht 1.651 m (5' 5)   Wt 89.8 kg (198 lb)   SpO2 98% Comment: room air  BMI 32.95 kg/m

## 2023-09-24 NOTE — Progress Notes (Signed)
 EGD AND ED ARE SCHEDULED FOR 10-14-2023 AT 11:00 AM.  PA NOT REQUIRED FOR EGD OR ED.-MEDICARE.

## 2023-09-29 ENCOUNTER — Encounter

## 2023-09-29 NOTE — H&P (View-Only) (Signed)
Mallory Bonilla is a 57 y.o. female who presents today for the following:  Chief Complaint   Patient presents with    problems swallowing     For 1 to 2 months.    HAD EGD 06-29-21    WENT TO ER 08-18-23  HAD CT SCAN    Follow-up    Gastroesophageal Reflux     BURNING SENSATION IN THROAT AFTER EATING RECENTLY    HOARSENESS     A LOT LATLEY    Hiccups         Allergies   Allergen Reactions    Gabapentin Other (See Comments)     PATIENT CAN'T REMEMBER WHAT IT DID       Current Outpatient Medications   Medication Sig Dispense Refill    hydroxychloroquine (PLAQUENIL) 200 MG tablet Take 1 tablet by mouth 2 times daily      topiramate (TOPAMAX) 50 MG tablet Take 1 tablet by mouth nightly at bedtime.      Cholecalciferol (VITAMIN D3) 1.25 MG (50000 UT) CAPS Take by mouth      estradiol-norethindrone (ACTIVELLA) 1-0.5 MG per tablet Take 1 tablet by mouth daily 84 tablet 3    pantoprazole (PROTONIX) 40 MG tablet Take 1 tablet by mouth once daily 30 tablet 11    carbidopa-levodopa (SINEMET) 10-100 MG per tablet Take 1 tablet by mouth 3 times daily      TRUE METRIX BLOOD GLUCOSE TEST strip USE 1 STRIP TO CHECK GLUCOSE TWICE DAILY      ibuprofen (ADVIL;MOTRIN) 800 MG tablet       ARIPiprazole (ABILIFY) 5 MG tablet Take 1 tablet by mouth daily      busPIRone (BUSPAR) 30 MG tablet TAKE 1 TABLET BY MOUTH TWICE DAILY FOR ANXIETY      clonazePAM (KLONOPIN) 1 MG tablet Take 1 tablet by mouth 2 times daily.      Dulaglutide (TRULICITY) 0.75 MG/0.5ML SOPN Inject 0.5 mLs into the skin every 7 days      Galcanezumab-gnlm (EMGALITY) 120 MG/ML SOAJ Inject 120 mg into the skin every 30 days      lisinopril (PRINIVIL;ZESTRIL) 5 MG tablet Take 1 tablet by mouth daily 3 TABS  QD      metFORMIN (GLUCOPHAGE) 500 MG tablet Take 1 tablet by mouth 2 times daily      pravastatin (PRAVACHOL) 40 MG tablet Take 1 tablet by mouth      rizatriptan (MAXALT) 5 MG tablet Take 1 tablet by mouth once as needed      rOPINIRole (REQUIP) 0.25 MG tablet Take 1 tablet  by mouth 3 times daily      vilazodone HCl (VIIBRYD) 20 MG TABS Take by mouth daily      linagliptin (TRADJENTA) 5 MG tablet       influenza quadrivalent split vaccine (FLUZONE;FLUARIX;FLULAVAL;AFLURIA) 0.5 ML injection PHARMACIST ADMINISTERED IMMUNIZATION ADMINISTERED AT TIME OF DISPENSING       No current facility-administered medications for this visit.       Past Medical History:   Diagnosis Date    Acid reflux 06/13/2021    Agoraphobia     Anxiety 01/11/2021    Anxiety     Arthritis     Bipolar 1 disorder (HCC) 01/11/2021    COVID-19 10/09/2021    Diabetes (HCC)     Diabetes mellitus type 2, controlled (HCC) 01/11/2021    Gastritis     Headache     Headache 01/11/2021    Hypercholesterolemia     Hyperlipidemia  01/11/2021    Hypertension     Hypertension 01/11/2021    Menopause     PMB (postmenopausal bleeding)     Throat pain        Past Surgical History:   Procedure Laterality Date    COLONOSCOPY  05/30/2017    GYN      uterine ablasion    ORTHOPEDIC SURGERY      foot surgery    OVARY REMOVAL         Family History   Problem Relation Age of Onset    Cancer Mother     Diabetes Mother     Cancer Sister     Diabetes Brother     Heart Disease Sister     Liver Disease Other     Heart Disease Brother     Emphysema Father     Diabetes Sister     Macular Degen Other        Social History     Socioeconomic History    Marital status: Divorced     Spouse name: Not on file    Number of children: Not on file    Years of education: Not on file    Highest education level: Not on file   Occupational History    Not on file   Tobacco Use    Smoking status: Never    Smokeless tobacco: Never   Vaping Use    Vaping status: Never Used   Substance and Sexual Activity    Alcohol use: Not Currently    Drug use: Not Currently    Sexual activity: Yes     Birth control/protection: Post-menopausal     Comment: MENO   Other Topics Concern    Not on file   Social History Narrative    Not on file     Social Determinants of Health      Financial Resource Strain: Not on file   Food Insecurity: Not on file   Transportation Needs: Not on file   Physical Activity: Not on file   Stress: Not on file   Social Connections: Not on file   Intimate Partner Violence: Not At Risk (11/03/2021)    Humiliation, Afraid, Rape, and Kick questionnaire     Fear of Current or Ex-Partner: No     Emotionally Abused: No     Physically Abused: No     Sexually Abused: No   Housing Stability: Not on file         57 year old female with history of hypertension, hyperlipidemia, gastroesophageal reflux disease, diabetes mellitus type 2 and depression/anxiety who comes in for evaluation of dysphagia.  Patient states she has a problem with swallowing.  She has a lot of anxiety when she swallows.  It is hard for her to swallow pills.  This is been getting worse and even has problems swallowing saliva at times.  She has had hoarseness and hiccups again.  She has a burning sensation after eating.  She does take pantoprazole 40 mg daily.  She had a CT scan and found to have gastritis.  She was treated with sucralfate which she has completed.  Her bowel movements are constipated for the last few weeks she has however had a good bowel for the last few days.  Last colonoscopy was 6 and half years ago and okay according to her.    Gastroesophageal Reflux  She reports no abdominal pain or no nausea.         Review of  Systems   Constitutional: Negative.    HENT:  Negative for nosebleeds.    Respiratory: Negative.     Cardiovascular: Negative.    Gastrointestinal:  Negative for abdominal distention, abdominal pain, anal bleeding, blood in stool, constipation, diarrhea, nausea, rectal pain and vomiting.   Genitourinary: Negative.    Musculoskeletal:  Positive for arthralgias.   Skin: Negative.    Allergic/Immunologic: Negative.    Neurological: Negative.    Hematological: Negative.    Psychiatric/Behavioral: Negative.     All other systems reviewed and are negative.          BP (!) 140/70  (Site: Left Upper Arm, Position: Sitting, Cuff Size: Medium Adult)   Pulse 93   Temp 97.5 F (36.4 C) (Temporal)   Resp 14   Ht 1.651 m (5\' 5" )   Wt 89.8 kg (198 lb)   SpO2 98% Comment: room air  BMI 32.95 kg/m     Physical Exam  Vitals and nursing note reviewed.   Constitutional:       Appearance: Normal appearance. She is obese.   HENT:      Head: Normocephalic and atraumatic.      Nose: Nose normal.   Eyes:      General: No scleral icterus.  Cardiovascular:      Rate and Rhythm: Normal rate and regular rhythm.      Pulses: Normal pulses.      Heart sounds: Normal heart sounds.   Pulmonary:      Effort: Pulmonary effort is normal.      Breath sounds: Normal breath sounds.   Abdominal:      General: Abdomen is flat. There is no distension.      Palpations: Abdomen is soft. There is no mass.      Tenderness: There is abdominal tenderness. There is no right CVA tenderness, left CVA tenderness, guarding or rebound.      Hernia: No hernia is present.   Musculoskeletal:      Right lower leg: No edema.      Left lower leg: No edema.   Skin:     General: Skin is warm and dry.   Neurological:      General: No focal deficit present.      Mental Status: She is alert and oriented to person, place, and time. Mental status is at baseline.   Psychiatric:         Mood and Affect: Mood normal.         Behavior: Behavior normal.         Thought Content: Thought content normal.         Judgment: Judgment normal.        1. Esophageal dysphagia  We will schedule patient for an upper endoscopy with esophageal dilatation.  - UPPER GI ENDOSCOPY,DIAGNOSIS    2. Gastroesophageal reflux disease with esophagitis without hemorrhage      3. Chronic constipation

## 2023-09-29 NOTE — Progress Notes (Signed)
Mallory Bonilla is a 57 y.o. female who presents today for the following:  Chief Complaint   Patient presents with    problems swallowing     For 1 to 2 months.    HAD EGD 06-29-21    WENT TO ER 08-18-23  HAD CT SCAN    Follow-up    Gastroesophageal Reflux     BURNING SENSATION IN THROAT AFTER EATING RECENTLY    HOARSENESS     A LOT LATLEY    Hiccups         Allergies   Allergen Reactions    Gabapentin Other (See Comments)     PATIENT CAN'T REMEMBER WHAT IT DID       Current Outpatient Medications   Medication Sig Dispense Refill    hydroxychloroquine (PLAQUENIL) 200 MG tablet Take 1 tablet by mouth 2 times daily      topiramate (TOPAMAX) 50 MG tablet Take 1 tablet by mouth nightly at bedtime.      Cholecalciferol (VITAMIN D3) 1.25 MG (50000 UT) CAPS Take by mouth      estradiol-norethindrone (ACTIVELLA) 1-0.5 MG per tablet Take 1 tablet by mouth daily 84 tablet 3    pantoprazole (PROTONIX) 40 MG tablet Take 1 tablet by mouth once daily 30 tablet 11    carbidopa-levodopa (SINEMET) 10-100 MG per tablet Take 1 tablet by mouth 3 times daily      TRUE METRIX BLOOD GLUCOSE TEST strip USE 1 STRIP TO CHECK GLUCOSE TWICE DAILY      ibuprofen (ADVIL;MOTRIN) 800 MG tablet       ARIPiprazole (ABILIFY) 5 MG tablet Take 1 tablet by mouth daily      busPIRone (BUSPAR) 30 MG tablet TAKE 1 TABLET BY MOUTH TWICE DAILY FOR ANXIETY      clonazePAM (KLONOPIN) 1 MG tablet Take 1 tablet by mouth 2 times daily.      Dulaglutide (TRULICITY) 0.75 MG/0.5ML SOPN Inject 0.5 mLs into the skin every 7 days      Galcanezumab-gnlm (EMGALITY) 120 MG/ML SOAJ Inject 120 mg into the skin every 30 days      lisinopril (PRINIVIL;ZESTRIL) 5 MG tablet Take 1 tablet by mouth daily 3 TABS  QD      metFORMIN (GLUCOPHAGE) 500 MG tablet Take 1 tablet by mouth 2 times daily      pravastatin (PRAVACHOL) 40 MG tablet Take 1 tablet by mouth      rizatriptan (MAXALT) 5 MG tablet Take 1 tablet by mouth once as needed      rOPINIRole (REQUIP) 0.25 MG tablet Take 1 tablet  by mouth 3 times daily      vilazodone HCl (VIIBRYD) 20 MG TABS Take by mouth daily      linagliptin (TRADJENTA) 5 MG tablet       influenza quadrivalent split vaccine (FLUZONE;FLUARIX;FLULAVAL;AFLURIA) 0.5 ML injection PHARMACIST ADMINISTERED IMMUNIZATION ADMINISTERED AT TIME OF DISPENSING       No current facility-administered medications for this visit.       Past Medical History:   Diagnosis Date    Acid reflux 06/13/2021    Agoraphobia     Anxiety 01/11/2021    Anxiety     Arthritis     Bipolar 1 disorder (HCC) 01/11/2021    COVID-19 10/09/2021    Diabetes (HCC)     Diabetes mellitus type 2, controlled (HCC) 01/11/2021    Gastritis     Headache     Headache 01/11/2021    Hypercholesterolemia     Hyperlipidemia  01/11/2021    Hypertension     Hypertension 01/11/2021    Menopause     PMB (postmenopausal bleeding)     Throat pain        Past Surgical History:   Procedure Laterality Date    COLONOSCOPY  05/30/2017    GYN      uterine ablasion    ORTHOPEDIC SURGERY      foot surgery    OVARY REMOVAL         Family History   Problem Relation Age of Onset    Cancer Mother     Diabetes Mother     Cancer Sister     Diabetes Brother     Heart Disease Sister     Liver Disease Other     Heart Disease Brother     Emphysema Father     Diabetes Sister     Macular Degen Other        Social History     Socioeconomic History    Marital status: Divorced     Spouse name: Not on file    Number of children: Not on file    Years of education: Not on file    Highest education level: Not on file   Occupational History    Not on file   Tobacco Use    Smoking status: Never    Smokeless tobacco: Never   Vaping Use    Vaping status: Never Used   Substance and Sexual Activity    Alcohol use: Not Currently    Drug use: Not Currently    Sexual activity: Yes     Birth control/protection: Post-menopausal     Comment: MENO   Other Topics Concern    Not on file   Social History Narrative    Not on file     Social Determinants of Health      Financial Resource Strain: Not on file   Food Insecurity: Not on file   Transportation Needs: Not on file   Physical Activity: Not on file   Stress: Not on file   Social Connections: Not on file   Intimate Partner Violence: Not At Risk (11/03/2021)    Humiliation, Afraid, Rape, and Kick questionnaire     Fear of Current or Ex-Partner: No     Emotionally Abused: No     Physically Abused: No     Sexually Abused: No   Housing Stability: Not on file         57 year old female with history of hypertension, hyperlipidemia, gastroesophageal reflux disease, diabetes mellitus type 2 and depression/anxiety who comes in for evaluation of dysphagia.  Patient states she has a problem with swallowing.  She has a lot of anxiety when she swallows.  It is hard for her to swallow pills.  This is been getting worse and even has problems swallowing saliva at times.  She has had hoarseness and hiccups again.  She has a burning sensation after eating.  She does take pantoprazole 40 mg daily.  She had a CT scan and found to have gastritis.  She was treated with sucralfate which she has completed.  Her bowel movements are constipated for the last few weeks she has however had a good bowel for the last few days.  Last colonoscopy was 6 and half years ago and okay according to her.    Gastroesophageal Reflux  She reports no abdominal pain or no nausea.         Review of  Systems   Constitutional: Negative.    HENT:  Negative for nosebleeds.    Respiratory: Negative.     Cardiovascular: Negative.    Gastrointestinal:  Negative for abdominal distention, abdominal pain, anal bleeding, blood in stool, constipation, diarrhea, nausea, rectal pain and vomiting.   Genitourinary: Negative.    Musculoskeletal:  Positive for arthralgias.   Skin: Negative.    Allergic/Immunologic: Negative.    Neurological: Negative.    Hematological: Negative.    Psychiatric/Behavioral: Negative.     All other systems reviewed and are negative.          BP (!) 140/70  (Site: Left Upper Arm, Position: Sitting, Cuff Size: Medium Adult)   Pulse 93   Temp 97.5 F (36.4 C) (Temporal)   Resp 14   Ht 1.651 m (5\' 5" )   Wt 89.8 kg (198 lb)   SpO2 98% Comment: room air  BMI 32.95 kg/m     Physical Exam  Vitals and nursing note reviewed.   Constitutional:       Appearance: Normal appearance. She is obese.   HENT:      Head: Normocephalic and atraumatic.      Nose: Nose normal.   Eyes:      General: No scleral icterus.  Cardiovascular:      Rate and Rhythm: Normal rate and regular rhythm.      Pulses: Normal pulses.      Heart sounds: Normal heart sounds.   Pulmonary:      Effort: Pulmonary effort is normal.      Breath sounds: Normal breath sounds.   Abdominal:      General: Abdomen is flat. There is no distension.      Palpations: Abdomen is soft. There is no mass.      Tenderness: There is abdominal tenderness. There is no right CVA tenderness, left CVA tenderness, guarding or rebound.      Hernia: No hernia is present.   Musculoskeletal:      Right lower leg: No edema.      Left lower leg: No edema.   Skin:     General: Skin is warm and dry.   Neurological:      General: No focal deficit present.      Mental Status: She is alert and oriented to person, place, and time. Mental status is at baseline.   Psychiatric:         Mood and Affect: Mood normal.         Behavior: Behavior normal.         Thought Content: Thought content normal.         Judgment: Judgment normal.        1. Esophageal dysphagia  We will schedule patient for an upper endoscopy with esophageal dilatation.  - UPPER GI ENDOSCOPY,DIAGNOSIS    2. Gastroesophageal reflux disease with esophagitis without hemorrhage      3. Chronic constipation

## 2023-09-30 MED ORDER — PANTOPRAZOLE SODIUM 40 MG PO TBEC
40 | ORAL_TABLET | ORAL | 3 refills | Status: DC
Start: 2023-09-30 — End: 2024-07-30

## 2023-10-14 ENCOUNTER — Inpatient Hospital Stay: Payer: MEDICARE | Attending: Gastroenterology

## 2023-10-14 LAB — POCT GLUCOSE: POC Glucose: 135 mg/dL — ABNORMAL HIGH (ref 65–100)

## 2023-10-14 MED ORDER — PROPOFOL 200 MG/20ML IV EMUL
200 | Freq: Once | INTRAVENOUS | Status: DC | PRN
Start: 2023-10-14 — End: 2023-10-14
  Administered 2023-10-14: 16:00:00 100 via INTRAVENOUS
  Administered 2023-10-14: 16:00:00 75 via INTRAVENOUS

## 2023-10-14 MED ORDER — SODIUM CHLORIDE 0.9 % IV SOLN
0.9 | INTRAVENOUS | Status: DC | PRN
Start: 2023-10-14 — End: 2023-10-14
  Administered 2023-10-14: 14:00:00 25 mL via INTRAVENOUS

## 2023-10-14 MED ORDER — PROPOFOL 200 MG/20ML IV EMUL
200 | INTRAVENOUS | Status: AC
Start: 2023-10-14 — End: ?

## 2023-10-14 MED FILL — DIPRIVAN 200 MG/20ML IV EMUL: 200 MG/20ML | INTRAVENOUS | Qty: 20

## 2023-10-14 NOTE — Anesthesia Post-Procedure Evaluation (Signed)
Department of Anesthesiology  Postprocedure Note    Patient: Mallory Bonilla  MRN: [id number]  Birthdate: 11-16-1966  Date of evaluation: 10/14/2023    Procedure Summary       Date: 10/14/23 Room / Location: [facility] / [facility]    Anesthesia Start: 1213 Anesthesia Stop: 1242    Procedures:       ESOPHAGOGASTRODUODENOSCOPY WITH DILATION (MALONEY) (Mouth)      . (Mouth) Diagnosis:       Esophageal dysphagia      (Esophageal dysphagia [R13.19])    Surgeons: Knox Royalty., MD Responsible Provider: Ambrie Carte, Jonny Ruiz, APRN - CRNA    Anesthesia Type: TIVA ASA Status: 2            Anesthesia Type: No value filed.    Aldrete Phase I: Aldrete Score: 10    Aldrete Phase II: Aldrete Score: 9    Anesthesia Post Evaluation    Patient location during evaluation: PACU  Patient participation: complete - patient participated  Level of consciousness: awake  Airway patency: patent  Nausea & Vomiting: no vomiting and no nausea  Cardiovascular status: blood pressure returned to baseline and hemodynamically stable  Respiratory status: room air  Hydration status: stable  Multimodal analgesia pain management approach    No notable events documented.

## 2023-10-14 NOTE — Op Note (Signed)
EGD Procedure Note        Patient: Mallory Bonilla MRN: [id number]  SSN: [id number]    Date of Birth: 14-Jun-1966  Age: 57 y.o.  Sex: female        Date/Time:  10/14/2023 12:37 PM         IMPRESSION:       Lower esophageal stricture  Distal esophagitis (grade 2)  Antral gastritis       RECOMMENDATIONS:    Check biopsy results.  Repeat esophageal dilatation as needed.  Continue the pantoprazole 40 mg daily.    Procedure: Esophagogastroduodenoscopy with cold biopsy                      Esophageal dilatation    Indication: Esophageal dysphagia    Endoscopist:  Jorian Willhoite Gwenevere Ghazi, MD    Referring Provider:   Catarina Hartshorn, APRN - NP    History: The history and physical exam were reviewed and updated.     Endoscope: GIF H190 Olympus video endoscope    Extent of Exam: third portion of the duodenum    ASA: ASA 2 - Patient with mild systemic disease with no functional limitations    Anethesia/Sedation:  TIVA    Description of the procedure:   The procedure was discussed with the patient including risks, benefits, alternatives including risks of iv sedation, bleeding, perforation and aspiration.  A safety timeout was performed. The patient was placed in the left lateral decubitus position.  A bite block was placed.  The patient was using standard protocol.  The patients vital signs were monitored at all times including heart rate/rhythm, blood pressure and oxygen saturation.  The endoscope was then passed under direct visualization to the third portion of the duodenum.  The endoscope was then slowly withdrawn while visualizing the mucosa.  In the stomach a retroflexion was performed and gastric fundus and cardia visualized. The patient was then transferred to recovery in stable condition.                Findings:   Esophagus:The esophageal mucosa was inflamed in the distal esophagus consistent with a grade 2 esophagitis.  Patient also had lower esophageal stricture..  Stomach: The gastric mucosa was inflamed throughout  the gastric antrum.  Multiple biopsies taken there..   Duodenum: The duodenum mucosa was normal with no ulceration, mass, stricture and no evidence of villous atrophy.     Therapies: Esophageal dilatation with use of the 54 French Maloney bougie.  The dilator was well-lubricated and then inserted into the oropharynx and taken down the esophagus time 5 passes and then removed.  The patient tolerated the procedure well.    Specimens:   ID Type Source Tests Collected by Time Destination   1 : gastric antrum Tissue Stomach SURGICAL PATHOLOGY Knox Royalty., MD 10/14/2023 1228         Assistants:   Circulator: Cheryln Manly, RN  Scrub Person First: Carlton Adam L    [id number]    Complications:   None; patient tolerated the procedure well.     Implants: None    Discharge disposition:  Out of the recovery area when discharge criteria met         Knox Royalty, MD  October 14, 2023  12:37 PM

## 2023-10-14 NOTE — Interval H&P Note (Signed)
Update History & Physical    The patient's History and Physical of October 14, 2023 was reviewed with the patient and I examined the patient. There was no change. The surgical site was confirmed by the patient and me.     Plan: The risks, benefits, expected outcome, and alternative to the recommended procedure have been discussed with the patient. Patient understands and wants to proceed with the procedure.     Electronically signed by Knox Royalty, MD on 10/14/2023 at 10:10 AM

## 2023-10-14 NOTE — Anesthesia Pre-Procedure Evaluation (Signed)
Department of Anesthesiology  Preprocedure Note       Name:  Mallory Bonilla   Age:  57 y.o.  DOB:  1966-09-22                                          MRN:  [id number]         Date:  10/14/2023      Surgeon: Moishe Spice):  Knox Royalty., MD    Procedure: Procedure(s):  ESOPHAGOGASTRODUODENOSCOPY WITH DILATION (MALONEY)  .    Medications prior to admission:   Prior to Admission medications    Medication Sig Start Date End Date Taking? Authorizing Provider   pantoprazole (PROTONIX) 40 MG tablet Take 1 tablet by mouth once daily 09/30/23  Yes Knox Royalty., MD   hydroxychloroquine (PLAQUENIL) 200 MG tablet Take 1 tablet by mouth 2 times daily   Yes [provider]   topiramate (TOPAMAX) 50 MG tablet Take 1 tablet by mouth nightly at bedtime.   Yes [provider]   Cholecalciferol (VITAMIN D3) 1.25 MG (50000 UT) CAPS Take by mouth   Yes [provider]   estradiol-norethindrone (ACTIVELLA) 1-0.5 MG per tablet Take 1 tablet by mouth daily 07/22/23  Yes [name], MD   carbidopa-levodopa (SINEMET) 10-100 MG per tablet Take 1 tablet by mouth 3 times daily 07/15/22  Yes [provider]   TRUE METRIX BLOOD GLUCOSE TEST strip USE 1 STRIP TO CHECK GLUCOSE TWICE DAILY 07/26/22  Yes [provider]   linagliptin (TRADJENTA) 5 MG tablet    Yes [provider]   influenza quadrivalent split vaccine (FLUZONE;FLUARIX;FLULAVAL;AFLURIA) 0.5 ML injection PHARMACIST ADMINISTERED IMMUNIZATION ADMINISTERED AT TIME OF DISPENSING   Yes [provider]   ARIPiprazole (ABILIFY) 5 MG tablet Take 1 tablet by mouth daily 09/01/21  Yes Automatic Reconciliation, Ar   busPIRone (BUSPAR) 30 MG tablet TAKE 1 TABLET BY MOUTH TWICE DAILY FOR ANXIETY 07/30/21  Yes Automatic Reconciliation, Ar   clonazePAM (KLONOPIN) 1 MG tablet Take 1 tablet by mouth 2 times daily.   Yes Automatic Reconciliation, Ar   Dulaglutide (TRULICITY) 0.75 MG/0.5ML SOPN Inject 0.5 mLs into the skin every 7  days   Yes Automatic Reconciliation, Ar   Galcanezumab-gnlm (EMGALITY) 120 MG/ML SOAJ Inject 120 mg into the skin every 30 days 03/23/21  Yes Automatic Reconciliation, Ar   lisinopril (PRINIVIL;ZESTRIL) 5 MG tablet Take 1 tablet by mouth daily 3 TABS  QD 03/30/21  Yes Automatic Reconciliation, Ar   metFORMIN (GLUCOPHAGE) 500 MG tablet Take 1 tablet by mouth 2 times daily 05/29/21  Yes Automatic Reconciliation, Ar   pravastatin (PRAVACHOL) 40 MG tablet Take 1 tablet by mouth   Yes Automatic Reconciliation, Ar   rizatriptan (MAXALT) 5 MG tablet Take 1 tablet by mouth once as needed   Yes Automatic Reconciliation, Ar   vilazodone HCl (VIIBRYD) 20 MG TABS Take by mouth daily   Yes Automatic Reconciliation, Ar   ibuprofen (ADVIL;MOTRIN) 800 MG tablet     [provider]   rOPINIRole (REQUIP) 0.25 MG tablet Take 1 tablet by mouth 3 times daily    Automatic Reconciliation, Ar       Current medications:    Current Facility-Administered Medications   Medication Dose Route Frequency Provider Last Rate Last Admin   . 0.9 % sodium chloride infusion  25 mL IntraVENous PRN Knox Royalty., MD 100  mL/hr at 10/14/23 1028 25 mL at 10/14/23 1028       Allergies:    Allergies   Allergen Reactions   . Gabapentin Other (See Comments)     PATIENT CAN'T REMEMBER WHAT IT DID       Problem List:    Patient Active Problem List   Diagnosis Code   . Bipolar 1 disorder (HCC) F31.9   . Headache R51.9   . Hyperlipidemia E78.5   . Hypertension I10   . Anxiety F41.9   . Diabetes mellitus type 2, controlled (HCC) E11.9   . MDD (major depressive disorder) F32.9   . 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 Acid reflux K21.9   . PMB (postmenopausal bleeding) N95.0   . BMI 32.0-32.9,adult Z68.32   . Gastritis K29.70   . Esophageal dysphagia R13.19       Past Medical History:        Diagnosis Date   . Acid reflux 06/13/2021   . Agoraphobia    . Anxiety 01/11/2021   . Anxiety    . Arthritis    . Bipolar 1 disorder (HCC) 01/11/2021   . COVID-19 10/09/2021   .  Diabetes (HCC)    . Diabetes mellitus type 2, controlled (HCC) 01/11/2021   . Gastritis    . Headache    . Headache 01/11/2021   . Hypercholesterolemia    . Hyperlipidemia 01/11/2021   . Hypertension    . Hypertension 01/11/2021   . Menopause    . PMB (postmenopausal bleeding)    . Throat pain        Past Surgical History:        Procedure Laterality Date   . COLONOSCOPY  05/30/2017   . GYN      uterine ablasion   . ORTHOPEDIC SURGERY      foot surgery   . OVARY REMOVAL         Social History:    Social History     Tobacco Use   . Smoking status: Never   . Smokeless tobacco: Never   Substance Use Topics   . Alcohol use: Not Currently                                Counseling given: Not Answered      Vital Signs (Current):   Vitals:    10/14/23 0950   BP: (!) 142/66   Pulse: 90   Resp: 18   Temp: 36.2 C (97.1 F)   TempSrc: Temporal   SpO2: 98%   Weight: 88.2 kg (194 lb 8 oz)   Height: 1.651 m (5\' 5" )                                              BP Readings from Last 3 Encounters:   10/14/23 (!) 142/66   09/22/23 (!) 140/70   07/22/23 134/76       NPO Status: Time of last liquid consumption: 2030                        Time of last solid consumption: 2030                        Date of last liquid consumption: 10/13/23  Date of last solid food consumption: 10/13/23    BMI:   Wt Readings from Last 3 Encounters:   10/14/23 88.2 kg (194 lb 8 oz)   09/22/23 89.8 kg (198 lb)   07/24/23 89.8 kg (198 lb)     Body mass index is 32.37 kg/m.    CBC:   Lab Results   Component Value Date/Time    WBC 7.3 03/23/2021 10:50 PM    RBC 5.43 03/23/2021 10:50 PM    HGB 13.0 10/31/2021 03:35 PM    HCT 42.9 03/23/2021 10:50 PM    MCV 79.0 03/23/2021 10:50 PM    RDW 14.0 03/23/2021 10:50 PM    PLT 163 03/23/2021 10:50 PM       CMP:   Lab Results   Component Value Date/Time    NA 139 03/23/2021 10:50 PM    K 3.3 03/23/2021 10:50 PM    CL 104 03/23/2021 10:50 PM    CO2 26 03/23/2021 10:50 PM    BUN 14 03/23/2021 10:50  PM    CREATININE 0.77 03/23/2021 10:50 PM    GFRAA >60 03/23/2021 10:50 PM    AGRATIO 1.0 03/23/2021 10:50 PM    GLUCOSE 128 03/23/2021 10:50 PM    CALCIUM 8.9 03/23/2021 10:50 PM    BILITOT 0.3 03/23/2021 10:50 PM    ALKPHOS 94 03/23/2021 10:50 PM    AST 31 03/23/2021 10:50 PM    ALT 41 03/23/2021 10:50 PM       POC Tests:   Recent Labs     10/14/23  1011   POCGLU 135*       Coags: No results found for: "PROTIME", "INR", "APTT"    HCG (If Applicable): No results found for: "PREGTESTUR", "PREGSERUM", "HCG", "HCGQUANT"     ABGs:   Lab Results   Component Value Date/Time    PHART 7.54 02/16/2021 04:28 AM    PO2ART 108 02/16/2021 04:28 AM    PCO2ART 25.4 02/16/2021 04:28 AM    HCO3ART 21.8 02/16/2021 04:28 AM    BEART 0.5 02/16/2021 04:28 AM        Type & Screen (If Applicable):  No results found for: "ABORH", "LABANTI"    Drug/Infectious Status (If Applicable):  No results found for: "HIV", "HEPCAB"    COVID-19 Screening (If Applicable):   Lab Results   Component Value Date/Time    COVID19 Not Detected 03/23/2021 10:50 PM    COVID19 Positive 08/17/2020 12:00 AM           Anesthesia Evaluation  Patient summary reviewed and Nursing notes reviewed  Airway: Mallampati: II  TM distance: >3 FB   Neck ROM: full  Mouth opening: > = 3 FB   Dental: normal exam         Pulmonary:Negative Pulmonary ROS and normal exam  breath sounds clear to auscultation                             Cardiovascular:Negative CV ROS  Exercise tolerance: good (>4 METS)  (+) hypertension:        Rhythm: regular  Rate: normal           Beta Blocker:  Not on Beta Blocker         Neuro/Psych:   Negative Neuro/Psych ROS  (+) headaches:, psychiatric history:            GI/Hepatic/Renal: Neg GI/Hepatic/Renal ROS  (+) GERD:, bowel prep  Endo/Other: Negative Endo/Other ROS   (+) DiabetesType II DM.                 Abdominal:             Vascular: negative vascular ROS.         Other Findings:       Anesthesia Plan      TIVA     ASA 2       Induction:  intravenous.      Anesthetic plan and risks discussed with patient.                    Marlyne Beards, APRN - CRNA   10/14/2023

## 2023-10-14 NOTE — Discharge Instructions (Signed)
For the next 12 hours you should not:   1. drive   2. drink alcohol   3. operate any machinery   4. engage in activities that require mental sharpness or manual dexterity such as     cooking   5. take any drugs other than those prescribed by a physician   6. make any legal or financial decisions    Call your doctor's office immediately, if there is is anything unusual:   1. increased and continuing rectal bleeding   2. fever   3. Unusual abdominal pain    Take it easy today and resume normal activity tomorrow.It is common to have gas and mild bloating for a few hours. Pain is NOT normal. You may be groggy off and on for a few hours.    Resume previous diet.        Mallory Bonilla Gwenevere Ghazi, MD  10/14/2023  12:42 PM

## 2023-10-14 NOTE — Progress Notes (Signed)
Dr. Gwinda Passe in to talk to the patient and her partner.

## 2023-10-23 ENCOUNTER — Inpatient Hospital Stay
Admission: EM | Admit: 2023-10-23 | Discharge: 2023-10-28 | Disposition: A | Discharge status: Home / Self Care | Payer: MEDICARE | Admitting: Psychiatry

## 2023-10-23 DIAGNOSIS — F333 Major depressive disorder, recurrent, severe with psychotic symptoms: Principal | ICD-10-CM

## 2023-10-23 DIAGNOSIS — R45851 Suicidal ideations: Secondary | ICD-10-CM

## 2023-10-23 DIAGNOSIS — F32A Depression, unspecified: Secondary | ICD-10-CM

## 2023-10-23 LAB — CBC WITH AUTO DIFFERENTIAL
Basophils %: 1 % (ref 0–1)
Basophils Absolute: 0.1 10*3/uL (ref 0.0–0.1)
Eosinophils %: 1 % (ref 0–7)
Eosinophils Absolute: 0.1 10*3/uL (ref 0.0–0.4)
Hematocrit: 43.9 % (ref 35.0–47.0)
Hemoglobin: 14.3 g/dL (ref 11.5–16.0)
Immature Granulocytes %: 0 % (ref 0–0.5)
Immature Granulocytes Absolute: 0 10*3/uL (ref 0.00–0.04)
Lymphocytes %: 32 % (ref 12–49)
Lymphocytes Absolute: 2.2 10*3/uL (ref 0.8–3.5)
MCH: 25.2 pg — ABNORMAL LOW (ref 26.0–34.0)
MCHC: 32.6 g/dL (ref 30.0–36.5)
MCV: 77.4 fL — ABNORMAL LOW (ref 80.0–99.0)
MPV: 10.6 fL (ref 8.9–12.9)
Monocytes %: 6 % (ref 5–13)
Monocytes Absolute: 0.4 10*3/uL (ref 0.0–1.0)
Neutrophils %: 60 % (ref 32–75)
Neutrophils Absolute: 4.1 10*3/uL (ref 1.8–8.0)
Nucleated RBCs: 0 /100{WBCs}
Platelets: 179 10*3/uL (ref 150–400)
RBC: 5.67 M/uL — ABNORMAL HIGH (ref 3.80–5.20)
RDW: 14.3 % (ref 11.5–14.5)
WBC: 7 10*3/uL (ref 3.6–11.0)
nRBC: 0 10*3/uL (ref 0.00–0.01)

## 2023-10-23 LAB — ETHANOL: Ethanol Lvl: <10 mg/dL (ref <10)

## 2023-10-23 LAB — URINALYSIS, MICRO

## 2023-10-23 LAB — URINALYSIS
Bilirubin, Urine: NEGATIVE
Blood, Urine: NEGATIVE
Glucose, Ur: NEGATIVE mg/dL
Nitrite, Urine: NEGATIVE
Specific Gravity, UA: >1.03 — ABNORMAL HIGH (ref 1.003–1.030)
Urobilinogen, Urine: 0.2 U/dL (ref 0.2–1.0)
pH, Urine: 6 (ref 5.0–8.0)

## 2023-10-23 LAB — BASIC METABOLIC PANEL
Anion Gap: 12 mmol/L (ref 2–12)
BUN/Creatinine Ratio: 9 — ABNORMAL LOW (ref 12–20)
BUN: 8 mg/dL (ref 6–20)
CO2: 24 mmol/L (ref 21–32)
Calcium: 9.5 mg/dL (ref 8.5–10.1)
Chloride: 108 mmol/L (ref 97–108)
Creatinine: 0.92 mg/dL (ref 0.55–1.02)
Est, Glom Filt Rate: 72 mL/min/{1.73_m2} (ref >60)
Glucose: 123 mg/dL — ABNORMAL HIGH (ref 65–100)
Potassium: 3.5 mmol/L (ref 3.5–5.1)
Sodium: 144 mmol/L (ref 136–145)

## 2023-10-23 LAB — URINE DRUG SCREEN
Amphetamine, Urine: NEGATIVE
Barbiturates, Urine: NEGATIVE
Benzodiazepines, Urine: NEGATIVE
Cocaine, Urine: NEGATIVE
MDMA, Urine: NEGATIVE
Methadone, Urine: NEGATIVE
Opiates, Urine: NEGATIVE
Phencyclidine, Urine: NEGATIVE
THC, TH-Cannabinol, Urine: NEGATIVE

## 2023-10-23 LAB — SALICYLATE LEVEL: Salicylate Lvl: 2.9 mg/dL (ref 2.8–20.0)

## 2023-10-23 LAB — ACETAMINOPHEN LEVEL: Acetaminophen Level: <2 ug/mL — ABNORMAL LOW (ref 10–30)

## 2023-10-23 MED ORDER — SULFAMETHOXAZOLE-TRIMETHOPRIM 800-160 MG PO TABS
800-160 | ORAL | Status: AC
Start: 2023-10-23 — End: 2023-10-23
  Administered 2023-10-24: 1 via ORAL

## 2023-10-23 NOTE — ED Notes (Signed)
Received care of patient resting in bed.  Calm and cooperative.  1:1 Safety sitter at bedside.

## 2023-10-23 NOTE — ED Provider Notes (Signed)
[facility] EMERGENCY DEPT  EMERGENCY DEPARTMENT ENCOUNTER      Pt Name: Mallory Bonilla  MRN: [id number]  Birthdate 02/20/1966  Date of evaluation: 10/23/2023  Provider: Crista Luria, MD    CHIEF COMPLAINT       Chief Complaint   Patient presents with    Mental Health Problem         HISTORY OF PRESENT ILLNESS   (Location/Symptom, Timing/Onset, Context/Setting, Quality, Duration, Modifying Factors, Severity)  Note limiting factors.   Mallory Bonilla is a 57 y.o. female who presents to the emergency department complaining of worsening depression with suicidal ideations beginning approximate week ago.  Underlying bipolar disorder agoraphobia and anxiety disorder.  Patient is socially isolated at home alone most of the day roommate works 2 jobs.  Denies auditory or visual hallucinations.  When asked about suicidal ideation she has thought of overdosing.    Only medical problems apparently esophageal dysphagia requiring dilation 10 days ago.  Type 2 diabetes    HPI    Nursing Notes were reviewed.    REVIEW OF SYSTEMS    (2-9 systems for level 4, 10 or more for level 5)     Review of Systems   All other systems reviewed and are negative.      Except as noted above the remainder of the review of systems was reviewed and negative.       PAST MEDICAL HISTORY     Past Medical History:   Diagnosis Date    Acid reflux 06/13/2021    Agoraphobia     Anxiety 01/11/2021    Anxiety     Arthritis     Bipolar 1 disorder (HCC) 01/11/2021    COVID-19 10/09/2021    Diabetes (HCC)     Diabetes mellitus type 2, controlled (HCC) 01/11/2021    Gastritis     Headache     Headache 01/11/2021    Hypercholesterolemia     Hyperlipidemia 01/11/2021    Hypertension     Hypertension 01/11/2021    Menopause     PMB (postmenopausal bleeding)     Throat pain          SURGICAL HISTORY       Past Surgical History:   Procedure Laterality Date    COLONOSCOPY  05/30/2017    ESOPHAGEAL DILATATION N/A 10/14/2023    . performed by Knox Royalty., MD at [facility] ENDOSCOPY     GYN      uterine ablasion    ORTHOPEDIC SURGERY      foot surgery    OVARY REMOVAL      UPPER GASTROINTESTINAL ENDOSCOPY N/A 10/14/2023    ESOPHAGOGASTRODUODENOSCOPY WITH DILATION (MALONEY) performed by Knox Royalty., MD at [facility] ENDOSCOPY         CURRENT MEDICATIONS       Previous Medications    ARIPIPRAZOLE (ABILIFY) 5 MG TABLET    Take 1 tablet by mouth daily    BUSPIRONE (BUSPAR) 30 MG TABLET    TAKE 1 TABLET BY MOUTH TWICE DAILY FOR ANXIETY    CARBIDOPA-LEVODOPA (SINEMET) 10-100 MG PER TABLET    Take 1 tablet by mouth 3 times daily    CHOLECALCIFEROL (VITAMIN D3) 1.25 MG (50000 UT) CAPS    Take by mouth    CLONAZEPAM (KLONOPIN) 1 MG TABLET    Take 1 tablet by mouth 2 times daily.    DULAGLUTIDE (TRULICITY) 0.75 MG/0.5ML SOPN    Inject 0.5 mLs into the skin every 7 days  ESTRADIOL-NORETHINDRONE (ACTIVELLA) 1-0.5 MG PER TABLET    Take 1 tablet by mouth daily    GALCANEZUMAB-GNLM (EMGALITY) 120 MG/ML SOAJ    Inject 120 mg into the skin every 30 days    HYDROXYCHLOROQUINE (PLAQUENIL) 200 MG TABLET    Take 1 tablet by mouth 2 times daily    IBUPROFEN (ADVIL;MOTRIN) 800 MG TABLET        INFLUENZA QUADRIVALENT SPLIT VACCINE (FLUZONE;FLUARIX;FLULAVAL;AFLURIA) 0.5 ML INJECTION    PHARMACIST ADMINISTERED IMMUNIZATION ADMINISTERED AT TIME OF DISPENSING    LINAGLIPTIN (TRADJENTA) 5 MG TABLET        LISINOPRIL (PRINIVIL;ZESTRIL) 5 MG TABLET    Take 1 tablet by mouth daily 3 TABS  QD    METFORMIN (GLUCOPHAGE) 500 MG TABLET    Take 1 tablet by mouth 2 times daily    PANTOPRAZOLE (PROTONIX) 40 MG TABLET    Take 1 tablet by mouth once daily    PRAVASTATIN (PRAVACHOL) 40 MG TABLET    Take 1 tablet by mouth    RIZATRIPTAN (MAXALT) 5 MG TABLET    Take 1 tablet by mouth once as needed    ROPINIROLE (REQUIP) 0.25 MG TABLET    Take 1 tablet by mouth 3 times daily    TOPIRAMATE (TOPAMAX) 50 MG TABLET    Take 1 tablet by mouth nightly at bedtime.    TRUE METRIX BLOOD GLUCOSE TEST STRIP    USE 1 STRIP TO CHECK GLUCOSE TWICE  DAILY    VILAZODONE HCL (VIIBRYD) 20 MG TABS    Take by mouth daily       ALLERGIES     Gabapentin, Lamotrigine, and Trazodone and nefazodone    FAMILY HISTORY       Family History   Problem Relation Age of Onset    Cancer Mother     Diabetes Mother     Cancer Sister     Diabetes Brother     Heart Disease Sister     Liver Disease Other     Heart Disease Brother     Emphysema Father     Diabetes Sister     Macular Degen Other           SOCIAL HISTORY       Social History     Socioeconomic History    Marital status: Divorced     Spouse name: None    Number of children: None    Years of education: None    Highest education level: None   Tobacco Use    Smoking status: Never    Smokeless tobacco: Never   Vaping Use    Vaping status: Never Used   Substance and Sexual Activity    Alcohol use: Not Currently    Drug use: Not Currently    Sexual activity: Yes     Birth control/protection: Post-menopausal     Comment: MENO     Social Determinants of Health      Social Connections [facility]RS[facility])   Intimate Partner Violence: Not At Risk (11/03/2021)    Humiliation, Afraid, Rape, and Kick questionnaire     Fear of Current or Ex-Partner: No     Emotionally Abused: No     Physically Abused: No     Sexually Abused: No       SCREENINGS         Glasgow Coma Scale  Eye Opening: Spontaneous  Best Verbal Response: Oriented  Best Motor Response: Obeys commands  Glasgow Coma Scale Score: 15  CIWA Assessment  BP: (!) 198/89  Pulse: (!) 105                 PHYSICAL EXAM    (up to 7 for level 4, 8 or more for level 5)     ED Triage Vitals [10/23/23 1730]   BP Systolic BP Percentile Diastolic BP Percentile Temp Temp src Pulse Respirations SpO2   (!) 198/89 -- -- 98 F (36.7 C) -- (!) 105 18 96 %      Height Weight - Scale         1.651 m (5\' 5" ) 90.7 kg (200 lb)           Pleasant middle-age white female no acute distress  Physical Exam  Vitals and nursing note reviewed.   Constitutional:       General: She is not in acute  distress.     Appearance: Normal appearance. She is not ill-appearing or toxic-appearing.   HENT:      Head: Normocephalic and atraumatic.      Nose: Nose normal.      Mouth/Throat:      Mouth: Mucous membranes are moist.      Pharynx: No oropharyngeal exudate or posterior oropharyngeal erythema.   Eyes:      Extraocular Movements: Extraocular movements intact.      Conjunctiva/sclera: Conjunctivae normal.   Cardiovascular:      Rate and Rhythm: Normal rate and regular rhythm.      Pulses: Normal pulses.      Heart sounds: Normal heart sounds. No murmur heard.  Pulmonary:      Effort: No respiratory distress.      Breath sounds: No wheezing, rhonchi or rales.   Abdominal:      General: Abdomen is flat. Bowel sounds are normal. There is no distension.      Tenderness: There is no abdominal tenderness. There is no right CVA tenderness, left CVA tenderness, guarding or rebound.      Hernia: No hernia is present.   Musculoskeletal:         General: No swelling, tenderness, deformity or signs of injury. Normal range of motion.      Cervical back: Normal range of motion and neck supple. No rigidity or tenderness.      Right lower leg: No edema.      Left lower leg: No edema.   Skin:     General: Skin is warm and dry.      Capillary Refill: Capillary refill takes less than 2 seconds.      Findings: No erythema or rash.   Neurological:      General: No focal deficit present.      Mental Status: She is alert and oriented to person, place, and time.      Cranial Nerves: No cranial nerve deficit.      Sensory: No sensory deficit.      Motor: No weakness.      Coordination: Coordination normal.      Gait: Gait normal.   Psychiatric:         Thought Content: Thought content normal.         Judgment: Judgment normal.      Comments: Appears depressed sad and withdrawn mildly anxious no auditory or visual hallucinations         DIAGNOSTIC RESULTS     EKG: All EKG's are interpreted by the Emergency Department Physician who either  signs or Co-signs this chart in the absence  of a cardiologist.      RADIOLOGY:   Non-plain film images such as CT, Ultrasound and MRI are read by the radiologist. Plain radiographic images are visualized and preliminarily interpreted by the emergency physician with the below findings:        Interpretation per the Radiologist below, if available at the time of this note:    No orders to display         ED BEDSIDE ULTRASOUND:   Performed by ED Physician - none    LABS:  Labs Reviewed   CBC WITH AUTO DIFFERENTIAL   BASIC METABOLIC PANEL   ETHANOL   URINE DRUG SCREEN   URINALYSIS   ACETAMINOPHEN LEVEL   SALICYLATE LEVEL       All other labs were within normal range or not returned as of this dictation.    EMERGENCY DEPARTMENT COURSE and DIFFERENTIAL DIAGNOSIS/MDM:   Vitals:    Vitals:    10/23/23 1730   BP: (!) 198/89   Pulse: (!) 105   Resp: 18   Temp: 98 F (36.7 C)   SpO2: 96%   Weight: 90.7 kg (200 lb)   Height: 1.651 m (5\' 5" )           Medical Decision Making  Amount and/or Complexity of Data Reviewed  Labs: ordered.    Risk  Prescription drug management.  Decision regarding hospitalization.    Medical clearance all labs are normal with exception of urinalysis which shows UTI there is no fever or flank pain assume this is cystitis will treat with Bactrim DS,    Patient is medically clear for psychiatric evaluation    2000: Patient left in the care of Dr. Denton Meek At change of shift awaiting disposition    REASSESSMENT          CRITICAL CARE TIME   Total Critical Care time was      minutes, excluding separately reportable procedures.  There was a high probability of clinically significant/life threatening deterioration in the patient's condition which required my urgent intervention.   CONSULTS:  None    PROCEDURES:  Unless otherwise noted below, none     Procedures    FINAL IMPRESSION    No diagnosis found.      DISPOSITION/PLAN   DISPOSITION             PATIENT REFERRED TO:  No follow-up provider  specified.    DISCHARGE MEDICATIONS:  New Prescriptions    No medications on file     Controlled Substances Monitoring:          No data to display                (Please note that portions of this note were completed with a voice recognition program.  Efforts were made to edit the dictations but occasionally words are mis-transcribed.)    Crista Luria, MD (electronically signed)  Attending Emergency Physician           Addison Lank, MD  10/24/23 [phone]
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
## 2023-10-23 NOTE — ED Triage Notes (Signed)
Reports for past month having increased depression, daughter recently went to college, SO works 2 jobs and not home much and she is unable to work d/t disabled, reports wants to overdose on some pills to kill self. Denies HI. Has hx of Overdosing in 2022.

## 2023-10-23 NOTE — ED Notes (Addendum)
Pt is talking to [facility] via screen. Pt has been wanded. UA sent. [facility] ED tech1:1 sitter

## 2023-10-23 NOTE — ED Notes (Signed)
Report to [place], [place]

## 2023-10-23 NOTE — ED Notes (Signed)
Pt  is resting on stretcher in blue scrubs. Labs, UA have been sent. Pt has eaten dinner. Belongings are locked up with security. Thayer Ohm, SMART called nurse and advised pt would be admitted. Pt is currently voluntary

## 2023-10-24 LAB — LIPID PANEL
Chol/HDL Ratio: 3.2 (ref 0.0–5.0)
Cholesterol, Total: 159 mg/dL (ref <200)
HDL: 50 mg/dL
LDL Cholesterol: 78.8 mg/dL (ref 0–100)
Triglycerides: 151 mg/dL — ABNORMAL HIGH (ref <150)
VLDL Cholesterol Calculated: 30.2 mg/dL

## 2023-10-24 LAB — POCT GLUCOSE
POC Glucose: 113 mg/dL — ABNORMAL HIGH (ref 65–100)
POC Glucose: 158 mg/dL — ABNORMAL HIGH (ref 65–100)

## 2023-10-24 LAB — HEMOGLOBIN A1C
Estimated Avg Glucose: 131 mg/dL
Hemoglobin A1C: 6.2 % — ABNORMAL HIGH (ref 4.0–5.6)

## 2023-10-24 MED ORDER — ALUM & MAG HYDROXIDE-SIMETH 200-200-20 MG/5ML PO SUSP
200-200-20 | Freq: Four times a day (QID) | ORAL | Status: DC | PRN
Start: 2023-10-24 — End: 2023-10-28

## 2023-10-24 MED ORDER — GLUCAGON HCL (DIAGNOSTIC) 1 MG IJ SOLR
1 | INTRAMUSCULAR | Status: DC | PRN
Start: 2023-10-24 — End: 2023-10-28

## 2023-10-24 MED ORDER — HYDROXYZINE HCL 50 MG PO TABS
50 | Freq: Three times a day (TID) | ORAL | Status: DC | PRN
Start: 2023-10-24 — End: 2023-10-28
  Administered 2023-10-24 – 2023-10-27 (×3): 50 mg via ORAL

## 2023-10-24 MED ORDER — HYDROXYCHLOROQUINE SULFATE 200 MG PO TABS
200 | Freq: Two times a day (BID) | ORAL | Status: DC
Start: 2023-10-24 — End: 2023-10-28
  Administered 2023-10-24 – 2023-10-28 (×9): 200 mg via ORAL

## 2023-10-24 MED ORDER — DIPHENHYDRAMINE HCL 50 MG/ML IJ SOLN
50 | INTRAMUSCULAR | Status: DC | PRN
Start: 2023-10-24 — End: 2023-10-28

## 2023-10-24 MED ORDER — TOPIRAMATE 25 MG PO TABS
25 | Freq: Every evening | ORAL | Status: DC
Start: 2023-10-24 — End: 2023-10-28
  Administered 2023-10-25 – 2023-10-28 (×4): 50 mg via ORAL

## 2023-10-24 MED ORDER — METFORMIN HCL 500 MG PO TABS
500 | Freq: Two times a day (BID) | ORAL | Status: DC
Start: 2023-10-24 — End: 2023-10-28
  Administered 2023-10-24 – 2023-10-28 (×8): 500 mg via ORAL

## 2023-10-24 MED ORDER — LISINOPRIL 5 MG PO TABS
5 | Freq: Every day | ORAL | Status: DC
Start: 2023-10-24 — End: 2023-10-28
  Administered 2023-10-24 – 2023-10-28 (×5): 5 mg via ORAL

## 2023-10-24 MED ORDER — HALOPERIDOL 5 MG PO TABS
5 | ORAL | Status: DC | PRN
Start: 2023-10-24 — End: 2023-10-28

## 2023-10-24 MED ORDER — ARIPIPRAZOLE 5 MG PO TABS
5 | Freq: Every evening | ORAL | Status: DC
Start: 2023-10-24 — End: 2023-10-28
  Administered 2023-10-25 – 2023-10-28 (×4): 5 mg via ORAL

## 2023-10-24 MED ORDER — METFORMIN HCL 500 MG PO TABS
500 | Freq: Two times a day (BID) | ORAL | Status: DC
Start: 2023-10-24 — End: 2023-10-24
  Administered 2023-10-24: 13:00:00 500 mg via ORAL

## 2023-10-24 MED ORDER — PRAVASTATIN SODIUM 40 MG PO TABS
40 | Freq: Every evening | ORAL | Status: DC
Start: 2023-10-24 — End: 2023-10-28
  Administered 2023-10-25 – 2023-10-28 (×4): 40 mg via ORAL

## 2023-10-24 MED ORDER — ROPINIROLE HCL 0.25 MG PO TABS
0.25 | Freq: Three times a day (TID) | ORAL | Status: DC
Start: 2023-10-24 — End: 2023-10-28
  Administered 2023-10-24 – 2023-10-28 (×12): 0.25 mg via ORAL

## 2023-10-24 MED ORDER — POLYETHYLENE GLYCOL 3350 17 G PO PACK
17 | Freq: Every day | ORAL | Status: DC | PRN
Start: 2023-10-24 — End: 2023-10-28

## 2023-10-24 MED ORDER — ACETAMINOPHEN 325 MG PO TABS
325 | ORAL | Status: DC | PRN
Start: 2023-10-24 — End: 2023-10-28

## 2023-10-24 MED ORDER — PANTOPRAZOLE SODIUM 40 MG PO TBEC
40 | Freq: Every day | ORAL | Status: DC
Start: 2023-10-24 — End: 2023-10-28
  Administered 2023-10-24 – 2023-10-28 (×5): 40 mg via ORAL

## 2023-10-24 MED ORDER — GLUCOSE 4 G PO CHEW
4 | ORAL | Status: DC | PRN
Start: 2023-10-24 — End: 2023-10-28

## 2023-10-24 MED ORDER — HALOPERIDOL LACTATE 5 MG/ML IJ SOLN
5 | INTRAMUSCULAR | Status: DC | PRN
Start: 2023-10-24 — End: 2023-10-28

## 2023-10-24 MED ORDER — VILAZODONE HCL 20 MG PO TABS
20 | Freq: Every evening | ORAL | Status: DC
Start: 2023-10-24 — End: 2023-10-28
  Administered 2023-10-25 – 2023-10-28 (×4): 20 mg via ORAL

## 2023-10-24 MED ORDER — SUMATRIPTAN SUCCINATE 25 MG PO TABS
25 | Freq: Two times a day (BID) | ORAL | Status: DC | PRN
Start: 2023-10-24 — End: 2023-10-28

## 2023-10-24 MED ORDER — BUSPIRONE HCL 10 MG PO TABS
10 | Freq: Two times a day (BID) | ORAL | Status: DC
Start: 2023-10-24 — End: 2023-10-28
  Administered 2023-10-25 – 2023-10-28 (×8): 30 mg via ORAL

## 2023-10-24 MED ORDER — INSULIN LISPRO 100 UNIT/ML IJ SOLN
100 | Freq: Two times a day (BID) | INTRAMUSCULAR | Status: DC
Start: 2023-10-24 — End: 2023-10-28

## 2023-10-24 MED ORDER — CARBIDOPA-LEVODOPA 10-100 MG PO TABS
10-100 | Freq: Three times a day (TID) | ORAL | Status: DC
Start: 2023-10-24 — End: 2023-10-28
  Administered 2023-10-24 – 2023-10-28 (×13): 1 via ORAL

## 2023-10-24 MED ORDER — SUMATRIPTAN SUCCINATE 25 MG PO TABS
25 | Freq: Once | ORAL | Status: DC
Start: 2023-10-24 — End: 2023-10-28

## 2023-10-24 MED ORDER — CEPHALEXIN 250 MG PO CAPS
250 | Freq: Four times a day (QID) | ORAL | Status: DC
Start: 2023-10-24 — End: 2023-10-28
  Administered 2023-10-24 – 2023-10-28 (×15): 500 mg via ORAL

## 2023-10-24 MED FILL — CARBIDOPA-LEVODOPA 10-100 MG PO TABS: 10-100 MG | ORAL | Qty: 1

## 2023-10-24 MED FILL — ROPINIROLE HCL 0.25 MG PO TABS: 0.25 MG | ORAL | Qty: 1

## 2023-10-24 MED FILL — CEPHALEXIN 250 MG PO CAPS: 250 MG | ORAL | Qty: 2

## 2023-10-24 MED FILL — SULFAMETHOXAZOLE-TRIMETHOPRIM 800-160 MG PO TABS: 800-160 MG | ORAL | Qty: 1

## 2023-10-24 MED FILL — PANTOPRAZOLE SODIUM 40 MG PO TBEC: 40 MG | ORAL | Qty: 1

## 2023-10-24 MED FILL — SUMATRIPTAN SUCCINATE 25 MG PO TABS: 25 MG | ORAL | Qty: 2

## 2023-10-24 MED FILL — HYDROXYCHLOROQUINE SULFATE 200 MG PO TABS: 200 MG | ORAL | Qty: 1

## 2023-10-24 MED FILL — HYDROXYZINE HCL 50 MG PO TABS: 50 MG | ORAL | Qty: 1

## 2023-10-24 MED FILL — METFORMIN HCL 500 MG PO TABS: 500 MG | ORAL | Qty: 1

## 2023-10-24 MED FILL — LISINOPRIL 5 MG PO TABS: 5 MG | ORAL | Qty: 1

## 2023-10-24 NOTE — Plan of Care (Signed)
Problem: Safety - Adult  Goal: Free from fall injury  Outcome: Progressing     Problem: Self Harm/Suicidality  Goal: Will have no self-injury during hospital stay  Description: INTERVENTIONS:  1.  Ensure constant observer at bedside with Q15M safety checks  2.  Maintain a safe environment  3.  Secure patient belongings  4.  Ensure family/visitors adhere to safety recommendations  5.  Ensure safety tray has been added to patient's diet order  6.  Every shift and PRN: Re-assess suicidal risk via Frequent Screener    Outcome: Progressing

## 2023-10-24 NOTE — Behavioral Health Treatment Team (Signed)
BH ADMISSION NOTE   Voluntary admisison    Thursday 10/23/2023    at 2136    Pt. Arrived on the unit at approx: 2136, escorted by Security and  ED staff via Via wheelchair.      From our  ER.       Pt. Awake. Alert and oriented x4, denies sI/HI and denies A/V hallucination upon admission, pt denies pain, cooperative speaking with soft voice tone,pt reported   depression and suicidal thoughts at home she drive her self to our ED her car in the parking lots and her significant other came and took the keys while she is in ED.  Pt was cooperative during admission process, pt was able to ambulate indepnednty withs teady gait to her room after  admission process completed,      Admission Type:   Admission Type: Voluntary    Reason for admission:   Reason for Admission: Pt said" I was going to take some pills  trying to kill myself".      Addictive Behavior:   Addictive Behavior  In the Past 3 Months, Have You Felt or Has Someone Told You That You Have a Problem With  : None      Medical Problems:   Past Medical History:   Diagnosis Date    Acid reflux 06/13/2021    Agoraphobia     Anxiety 01/11/2021    Anxiety     Arthritis     Bipolar 1 disorder (HCC) 01/11/2021    COVID-19 10/09/2021    Diabetes (HCC)     Diabetes mellitus type 2, controlled (HCC) 01/11/2021    Gastritis     Headache     Headache 01/11/2021    Hypercholesterolemia     Hyperlipidemia 01/11/2021    Hypertension     Hypertension 01/11/2021    Menopause     PMB (postmenopausal bleeding)     Throat pain          Psych History:  yes      Patient is not, a smoker.   If so, Nicotine patch ordered? NO     Patient does not drink Alcohol.      Patient does not, use Recreational substances or Street Drugs.      Status EXAM:  Mental Status and Behavioral Exam  Normal: No  Level of Assistance: Independent/Self  Facial Expression: Flat, Sad  Affect: Appropriate  Level of Consciousness: Alert  Frequency of Checks: 4 times per hour, close  Mood:Normal: No  Mood:  Depressed, Anxious, Sad  Motor Activity:Normal: Yes  Eye Contact: Fair  Observed Behavior: Cooperative, Friendly  Sexual Misconduct History: Current - no  Preception: Orient to person, Orient to time, Orient to place, Orient to situation  Attention:Normal: Yes  Thought Processes: Unremarkable  Thought Content:Normal: Yes  Depression Symptoms: Feelings of hopelessess, Feelings of helplessness, Feelings of worthlessness  Anxiety Symptoms: Generalized  Mania Symptoms: No problems reported or observed.  Hallucinations: None  Delusions: No  Memory:Normal: Yes  Insight and Judgment: No  Insight and Judgment: Poor judgment    Pt admitted with followings belongings:  Dental Appliances: None  Vision - Corrective Lenses: Eyeglasses, At bedside  Hearing Aid: None  Jewelry: Earrings, Watch  Body Piercings Removed: N/A  Clothing: Undergarments (1 undwerwear only)  Other Valuables: Credit/Debit Card (1 visa card, 1 master card, [place])      Valuables placed in safe in security envelope, number:  [id number]. Patient belongings, locked in locker on unit.  Valuables left with Patient at bedside Etegalsses.  Patient's did not have home medications.       Patient oriented to surroundings and program expectations and copy of patient rights given.         Received admission packet:  yes.     Consents reviewed, signed during Registration Process. . Patient verbalized understanding:  Yes.       Patient oriented on TDO Process ( if Involuntary) NO.   Pt is Voluntary     Patient checked for contraband none found.        Skin Assessment Completed.   Patient changed into Youth worker.     Patient oriented to Unit, Peers, Staff, and [employer].   Handbook explained, signed for and Copy given to patient with Personal ID Code.                    Pt reported being vaccinated against COVID 19 took previously one dose. Pt not vaccinated against flu and not welling to recive Flu vaccine while she is her.     Joslyn Hy, RN

## 2023-10-24 NOTE — Behavioral Health Treatment Team (Signed)
Pt slept by 2250, slept overnight with waking interval between 0140 till almost 0155, slept again, sleeping at this tim, no violent no self harming behavior noticed or reported.

## 2023-10-24 NOTE — Behavioral Health Treatment Team (Signed)
B:   Patient alert and oriented x 4.   Pt. States depression is 2.  Pt. States Anxiety is 2.  Pt. denies Hallucinations.  Pt. denies Delusions.  Pt. denies SI.  Pt. denies HI.   Pt. cooperative with Assessment.  Pt.'s behavior Anxious, Cooperative, and Pleasant. Pt expressed dealing with things at home that causes her stress. Educated pt on increasing her water intake, not to use bath and body shower gel to cleanse her vaginal area and wipe front to back. Pt explained, how she forget to drink during the day. Advised to label 3 bottles of water and use a straw to drink from, to prevent from having so many UTI's.        I:    If patient is disoriented, reorient pt.  Build trust with patient, by therapeutic listening and Groups.  Encourage pt. To attend and Participate in Groups.  Provide Medications as ordered and needed.  Encourage pt. To be up for all meals and snacks, and consume all of each. Encourage pt. To interact with staff and peers in a positive manner.  Encourage pt. To keep good hygiene.  Q 15 minute safety checks.    R:   Pt. did attend and Participate in Group.  Pt. Is Compliant with Medications   Yes.   Pt. is getting up for meals and snacks.  Pt. Consumes 75% of Meals.  Pt. is, interacting with Peers.   Pt.'s hygiene is Good.  Pt. does not, have any safety issues.    P:   Pt. Will develop and continue to utilize positive Coping skills.  Pt. Will continue to comply with Plan of Care toward Discharge.  Pt. Will continue to stay safe on the unit.     0545: Pt noted sleeping during making rounds with no distress noted. Will continue to monitor pt for safety. BS 138.

## 2023-10-24 NOTE — Progress Notes (Signed)
Pt has attended and participated in groups today. Interacted with staff and peers fairly well. Dr Hazle Quant visited and medical H&P done. Safe on unit  will continue to monitor.

## 2023-10-24 NOTE — Group Note (Signed)
Group Therapy Note    Date: 10/24/2023    Group Start Time: 1000  Group End Time: 1045  Group Topic: Community Meeting    [facility]    [place], [place]        Group Therapy Note    Attendees: 3       Patient's Goal:  To get some rest    Notes:  N/A    Status After Intervention:  Improved    Participation Level: Active Listener    Participation Quality: Appropriate      Speech:  normal      Thought Process/Content: Logical      Affective Functioning: Congruent      Mood: anxious      Level of consciousness:  Alert      Response to Learning: Able to verbalize current knowledge/experience      Endings: None Reported    Modes of Intervention: Support      Discipline Responsible: Microbiologist Health Tech      Signature:  Adella Hare

## 2023-10-24 NOTE — Behavioral Health Treatment Team (Signed)
Notified Dr. Hazle Quant regarding hospitalist consult via [employer].

## 2023-10-24 NOTE — Behavioral Health Treatment Team (Signed)
Patient refused Imitrex due to supposed to be as needed only, also states she takes Maxalt for migraines rather than Imitrex.  Notified hospitalist.

## 2023-10-24 NOTE — Plan of Care (Signed)
Problem: Chronic Conditions and Co-morbidities  Goal: Patient's chronic conditions and co-morbidity symptoms are monitored and maintained or improved  Outcome: Progressing     Problem: Discharge Planning  Goal: Discharge to home or other facility with appropriate resources  Outcome: Progressing     Problem: Pain  Goal: Verbalizes/displays adequate comfort level or baseline comfort level  Outcome: Progressing     Problem: Safety - Adult  Goal: Free from fall injury  Outcome: Progressing     Problem: Self Harm/Suicidality  Goal: Will have no self-injury during hospital stay  Description: INTERVENTIONS:  1.  Ensure constant observer at bedside with Q15M safety checks  2.  Maintain a safe environment  3.  Secure patient belongings  4.  Ensure family/visitors adhere to safety recommendations  5.  Ensure safety tray has been added to patient's diet order  6.  Every shift and PRN: Re-assess suicidal risk via Frequent Screener    Outcome: Progressing     Problem: Depression  Goal: Will be euthymic at discharge  Description: INTERVENTIONS:  1. Administer medication as ordered  2. Provide emotional support via 1:1 interaction with staff  3. Encourage involvement in milieu/groups/activities  4. Monitor for social isolation  Outcome: Progressing     Problem: Anxiety  Goal: Will report anxiety at manageable levels  Description: INTERVENTIONS:  1. Administer medication as ordered  2. Teach and rehearse alternative coping skills  3. Provide emotional support with 1:1 interaction with staff  Outcome: Progressing     Problem: Sleep Disturbance  Goal: Will exhibit normal sleeping pattern  Description: INTERVENTIONS:  1. Administer medication as ordered  2. Decrease environmental stimuli, including noise, as appropriate  3. Discourage social isolation and naps during the day  Outcome: Progressing     Problem: Self Care Deficit  Goal: Return ADL status to a safe level of function  Description: INTERVENTIONS:  1. Administer medication  as ordered  2. Assess ADL deficits and provide assistive devices as needed  3. Obtain PT/OT consults as needed  4. Assist and instruct patient to increase activity and self care as tolerated  Outcome: Progressing
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
## 2023-10-24 NOTE — Consults (Addendum)
Hospitalist Consult Note             Chief Complaints:     Chief Complaint   Patient presents with    Mental Health Problem         Subjective:     Mallory Bonilla is a 57 y.o. female followed by Catarina Hartshorn, APRN - NP and  has a past medical history of Acid reflux, Agoraphobia, Anxiety, Anxiety, Arthritis, Arthritis, rheumatoid (HCC), Bipolar 1 disorder (HCC), COVID-19, Diabetes (HCC), Diabetes mellitus type 2, controlled (HCC), Essential tremor, Gastritis, Headache, Headache, Hypercholesterolemia, Hyperlipidemia, Hypertension, Hypertension, Menopause, PMB (postmenopausal bleeding), and Throat pain.    Presents from home with worsening depression and had suicidal thoughts with plan of overdosing pills but came to the emergency room to get adjustment of medications.  She lives home alone since her daughter went to college about a month ago family member works 2 jobs she says she is home alone and is afraid to go out for fear of having a panic attack.  Says that she is still grieving the death of her mother best friend and sister in 2021.  She does have history of overdose and was admitted to [facility] prior to [facility] admission but denies any attempt this time to hurt herself.  She was evaluated and then presented for voluntary admission adjust medications for her depression so admitted to the behavioral health unit at [facility] for further evaluation and treat        Past Medical History:   Diagnosis Date    Acid reflux 06/13/2021    Agoraphobia     Anxiety 01/11/2021    Anxiety     Arthritis     Arthritis, rheumatoid (HCC)     Bipolar 1 disorder (HCC) 01/11/2021    COVID-19 10/09/2021    Diabetes (HCC)     Diabetes mellitus type 2, controlled (HCC) 01/11/2021    Essential tremor     Gastritis     Headache     Headache 01/11/2021    Hypercholesterolemia     Hyperlipidemia 01/11/2021    Hypertension     Hypertension 01/11/2021    Menopause     PMB (postmenopausal bleeding)      Throat pain       Past Surgical History:   Procedure Laterality Date    COLONOSCOPY  05/30/2017    ESOPHAGEAL DILATATION N/A 10/14/2023    . performed by Knox Royalty., MD at [facility] ENDOSCOPY    GYN      uterine ablasion    ORTHOPEDIC SURGERY      foot surgery    OVARY REMOVAL      UPPER GASTROINTESTINAL ENDOSCOPY N/A 10/14/2023    ESOPHAGOGASTRODUODENOSCOPY WITH DILATION (MALONEY) performed by Knox Royalty., MD at [facility] ENDOSCOPY     Family History   Problem Relation Age of Onset    Cancer Mother     Diabetes Mother     Cancer Sister     Diabetes Brother     Heart Disease Sister     Liver Disease Other     Heart Disease Brother     Emphysema Father     Diabetes Sister     Macular Degen Other       Social History     Tobacco Use    Smoking status: Never    Smokeless tobacco: Never    Tobacco comments:     Pt non smoker   Substance Use Topics  Alcohol use: Not Currently       Prior to Admission medications    Medication Sig Start Date End Date Taking? Authorizing Provider   pantoprazole (PROTONIX) 40 MG tablet Take 1 tablet by mouth once daily 09/30/23   Knox Royalty., MD   hydroxychloroquine (PLAQUENIL) 200 MG tablet Take 1 tablet by mouth 2 times daily    [provider]   topiramate (TOPAMAX) 50 MG tablet Take 1 tablet by mouth nightly at bedtime.    [provider]   Cholecalciferol (VITAMIN D3) 1.25 MG (50000 UT) CAPS Take by mouth    [provider]   estradiol-norethindrone (ACTIVELLA) 1-0.5 MG per tablet Take 1 tablet by mouth daily 07/22/23   [name], MD   carbidopa-levodopa (SINEMET) 10-100 MG per tablet Take 1 tablet by mouth 3 times daily 07/15/22   [provider]   TRUE METRIX BLOOD GLUCOSE TEST strip USE 1 STRIP TO CHECK GLUCOSE TWICE DAILY 07/26/22   [provider]   ibuprofen (ADVIL;MOTRIN) 800 MG tablet     [provider]   linagliptin (TRADJENTA) 5 MG tablet     [provider]   influenza quadrivalent split  vaccine (FLUZONE;FLUARIX;FLULAVAL;AFLURIA) 0.5 ML injection PHARMACIST ADMINISTERED IMMUNIZATION ADMINISTERED AT TIME OF DISPENSING    [provider]   ARIPiprazole (ABILIFY) 5 MG tablet Take 1 tablet by mouth daily 09/01/21   Automatic Reconciliation, Ar   busPIRone (BUSPAR) 30 MG tablet TAKE 1 TABLET BY MOUTH TWICE DAILY FOR ANXIETY 07/30/21   Automatic Reconciliation, Ar   clonazePAM (KLONOPIN) 1 MG tablet Take 1 tablet by mouth 2 times daily.    Automatic Reconciliation, Ar   Dulaglutide (TRULICITY) 0.75 MG/0.5ML SOPN Inject 0.5 mLs into the skin every 7 days    Automatic Reconciliation, Ar   Galcanezumab-gnlm (EMGALITY) 120 MG/ML SOAJ Inject 120 mg into the skin every 30 days 03/23/21   Automatic Reconciliation, Ar   lisinopril (PRINIVIL;ZESTRIL) 5 MG tablet Take 1 tablet by mouth daily 3 TABS  QD 03/30/21   Automatic Reconciliation, Ar   metFORMIN (GLUCOPHAGE) 500 MG tablet Take 1 tablet by mouth 2 times daily 05/29/21   Automatic Reconciliation, Ar   pravastatin (PRAVACHOL) 40 MG tablet Take 1 tablet by mouth    Automatic Reconciliation, Ar   rizatriptan (MAXALT) 5 MG tablet Take 1 tablet by mouth once as needed    Automatic Reconciliation, Ar   rOPINIRole (REQUIP) 0.25 MG tablet Take 1 tablet by mouth 3 times daily    Automatic Reconciliation, Ar   vilazodone HCl (VIIBRYD) 20 MG TABS Take by mouth daily    Automatic Reconciliation, Ar     Allergies   Allergen Reactions    Gabapentin Other (See Comments)     PATIENT CAN'T REMEMBER WHAT IT DID    Lamotrigine Other (See Comments)     At any dose higher than 50 mg once a day experiences dizziness, panic, and headaches.     At any dose higher than 50 mg once a day experiences dizziness, panic, and headaches.    Trazodone And Nefazodone Other (See Comments)     Caused insomnia        Review of Systems:  Review of Systems   Constitutional: Negative.    HENT: Negative.     Eyes: Negative.    Respiratory: Negative.     Cardiovascular: Negative.    Gastrointestinal:  Negative.    Endocrine: Negative.    Genitourinary: Negative.    Musculoskeletal: Negative.  Skin: Negative.    Allergic/Immunologic: Negative.    Neurological: Negative.    Hematological: Negative.    Psychiatric/Behavioral:  Positive for dysphoric mood.           Objective:     Vitals:  BP 125/68   Pulse 87   Temp 97.7 F (36.5 C) (Temporal)   Resp 16   Ht 1.651 m (5\' 5" )   Wt 87.5 kg (193 lb)   SpO2 97%   BMI 32.12 kg/m     Physical Exam:  General: Alert, cooperative, no distress.  Head:  Normocephalic, without obvious abnormality, atraumatic.  Eyes:  Conjunctivae/corneas clear. Pupils equal, round, reactive to light. Extraocular movements intact.  Lungs:  Clear to auscultation bilaterally, no wheezes, crackles  Chest wall: No tenderness or deformity.  Heart:  Regular rate and rhythm, S1, S2 normal, no murmur, click, rub, or gallop.  Abdomen:   Soft, non-tender. Bowel sounds normal. No masses. No organomegaly.  Back:  No spine tenderness to palpation  Extremities: Extremities normal, atraumatic, no cyanosis or edema.  Pulses: Symmetric all extremities.  Skin: Skin color, texture, turgor normal.   Lymph nodes: Cervical nodes normal.  Neurologic: CNII-XII intact. Normal strength, sensation, and reflexes throughout.      Labs:  Recent Results (from the past 24 hour(s))   Urine Drug Screen    Collection Time: 10/23/23  6:10 PM   Result Value Ref Range    Amphetamine, Urine Negative Negative      Barbiturates, Urine Negative Negative      Benzodiazepines, Urine Negative Negative      Cocaine, Urine Negative Negative      MDMA, Urine Negative Negative      Methadone, Urine Negative Negative      Opiates, Urine Negative Negative      Phencyclidine, Urine Negative Negative      THC, TH-Cannabinol, Urine Negative Negative      Comments:        This test is a screen for drugs of abuse in a medical setting only (i.e., they are unconfirmed results and as such must not be used for non-medical purposes,  e.g.,employment testing, legal testing). Due to its inherent nature, false positive (FP) and false negative (FN) results may be obtained. Therefore, if necessary for medical care, recommend confirmation of positive findings by GC/MS.     Urinalysis    Collection Time: 10/23/23  6:10 PM   Result Value Ref Range    Color, UA Yellow/Straw      Appearance Clear Clear      Specific Gravity, UA >1.030 (H) 1.003 - 1.030    pH, Urine 6.0 5.0 - 8.0      Protein, UA Trace (A) Negative mg/dL    Glucose, Ur Negative Negative mg/dL    Ketones, Urine Trace (A) Negative mg/dL    Bilirubin, Urine Negative Negative      Blood, Urine Negative Negative      Urobilinogen, Urine 0.2 0.2 - 1.0 EU/dL    Nitrite, Urine Negative Negative      Leukocyte Esterase, Urine Moderate (A) Negative     Urinalysis, Micro    Collection Time: 10/23/23  6:10 PM   Result Value Ref Range    WBC, UA 50-100 0 - 5 /hpf    RBC, UA 0-5 0 - 3 /hpf    BACTERIA, URINE 3+ (A) Negative /hpf   CBC with Auto Differential    Collection Time: 10/23/23  6:32 PM   Result Value Ref  Range    WBC 7.0 3.6 - 11.0 K/uL    RBC 5.67 (H) 3.80 - 5.20 M/uL    Hemoglobin 14.3 11.5 - 16.0 g/dL    Hematocrit 65.7 84.6 - 47.0 %    MCV 77.4 (L) 80.0 - 99.0 FL    MCH 25.2 (L) 26.0 - 34.0 PG    MCHC 32.6 30.0 - 36.5 g/dL    RDW 96.2 95.2 - 84.1 %    Platelets 179 150 - 400 K/uL    MPV 10.6 8.9 - 12.9 FL    Nucleated RBCs 0.0 0.0 PER 100 WBC    nRBC 0.00 0.00 - 0.01 K/uL    Neutrophils % 60 32 - 75 %    Lymphocytes % 32 12 - 49 %    Monocytes % 6 5 - 13 %    Eosinophils % 1 0 - 7 %    Basophils % 1 0 - 1 %    Immature Granulocytes % 0 0 - 0.5 %    Neutrophils Absolute 4.1 1.8 - 8.0 K/UL    Lymphocytes Absolute 2.2 0.8 - 3.5 K/UL    Monocytes Absolute 0.4 0.0 - 1.0 K/UL    Eosinophils Absolute 0.1 0.0 - 0.4 K/UL    Basophils Absolute 0.1 0.0 - 0.1 K/UL    Immature Granulocytes Absolute 0.0 0.00 - 0.04 K/UL    Differential Type AUTOMATED     Basic Metabolic Panel    Collection Time: 10/23/23   6:32 PM   Result Value Ref Range    Sodium 144 136 - 145 mmol/L    Potassium 3.5 3.5 - 5.1 mmol/L    Chloride 108 97 - 108 mmol/L    CO2 24 21 - 32 mmol/L    Anion Gap 12 2 - 12 mmol/L    Glucose 123 (H) 65 - 100 mg/dL    BUN 8 6 - 20 mg/dL    Creatinine 3.24 4.01 - 1.02 mg/dL    BUN/Creatinine Ratio 9 (L) 12 - 20      Est, Glom Filt Rate 72 >60 ml/min/1.62m2    Calcium 9.5 8.5 - 10.1 mg/dL   Ethanol    Collection Time: 10/23/23  6:32 PM   Result Value Ref Range    Ethanol Lvl <10 <10 mg/dL   Acetaminophen Level "IF" accidental or intentional ingestion.    Collection Time: 10/23/23  6:32 PM   Result Value Ref Range    Acetaminophen Level <2 (L) 10 - 30 ug/mL    DOSE DATE Not provided      DOSE AMOUNT Not provided Units   Salicylate "IF" accidental or intentional ingestion.    Collection Time: 10/23/23  6:32 PM   Result Value Ref Range    Salicylate Lvl 2.9 2.8 - 20.0 mg/dL    DOSE DATE Not provided      DOSE AMOUNT Not provided Units   Hemoglobin A1c    Collection Time: 10/23/23  6:32 PM   Result Value Ref Range    Hemoglobin A1C 6.2 (H) 4.0 - 5.6 %    Estimated Avg Glucose 131 mg/dL   Lipid Panel    Collection Time: 10/23/23  6:32 PM   Result Value Ref Range    LIPID PANEL        Cholesterol, Total 159 <200 mg/dL    Triglycerides 027 (H) <150 mg/dL    HDL 50 mg/dL    LDL Cholesterol 25.3 0 - 100 mg/dL    VLDL Cholesterol  Calculated 30.2 mg/dL    Chol/HDL Ratio 3.2 0.0 - 5.0     POCT Glucose    Collection Time: 10/24/23  7:51 AM   Result Value Ref Range    POC Glucose 113 (H) 65 - 100 mg/dL    Performed by: Tyler Pita        Imaging:  No results found.     Assessment & Plan:     Depression  -Progressively worsening continue adjustment of medications as per primary psychiatric team    Diabetes  -Follow A1c  -Current noted insulins that she takes at home are nonformulary recommend monitoring with sliding scale with Accu-Cheks twice daily AC and restart home metformin twice daily    Hypertension  -Recommend  monitoring as per protocol and restarting her home lisinopril at 5 mg daily    Hyperlipidemia  -Continue home pravastatin 40 mg nightly  -Follow lipid profile    Migraines  -Takes as needed Maxalt but not on formulary so Imitrex as needed ordered also takes Topamax 50 mg nightly    Essential tremor  -Has been started on Sinemet 1 tab oral 3 times a day and will continue home dosing    Rheumatoid arthritis  -Mainly in bilateral hands and is on Plaquenil 200 mg twice daily and would recommend to restart    GERD  -On pantoprazole 40 mg daily and recommend restarting    RLS  :-Noted to be on Requip low-dose 3 times daily and recommend restart    UTI  -Urinalysis shows moderate leukocytes 3+ bacteria so urine culture will be obtained  -Was given Bactrim dose in the emergency room as patient has no allergies to cephalosporins we will place patient on Keflex 500 mg oral 4 times daily x 5 days and adjust depending on urine culture result        Thank you for allowing [place] to help care for your patient please call with any questions or concerns    This is dictation was done by dragon, computer voice recognition software. Quite often unanticipated grammatical, syntax, homophones and other interpretive errors or inadvertently transcribed by the computer software. Please excuse errors that have escaped final proofreading. Thank you.       55 minutes evaluating and cordinating patient's consult to behavioral health unit for medical and neurological evaluation requested by Dr. Bernadette Hoit MD        Electronically signed by Darlyn Chamber, MD on 10/24/2023 at 2:15 PM

## 2023-10-24 NOTE — Group Note (Signed)
Group Therapy Note    Date: 10/24/2023    Group Start Time: 0800  Group End Time: 0845  Group Topic: Wrap-Up    [facility]    Shelby Dubin, CNA        Group Therapy Note    Attendees: 3       Patient's Goal: Completed goal    Notes:  N/A    Status After Intervention:  Improved    Participation Level: Active Listener    Participation Quality: Appropriate      Speech:  normal      Thought Process/Content: Logical      Affective Functioning: Congruent      Mood: anxious      Level of consciousness:  Alert      Response to Learning: Able to verbalize current knowledge/experience      Endings: None Reported    Modes of Intervention: Support      Discipline Responsible: Stage manager      Signature:  Shelby Dubin, CNA

## 2023-10-24 NOTE — Behavioral Health Treatment Team (Addendum)
TREATMENT TEAM COMPLETED     Attending meeting was as follows:  Patient, Student Nurse, Writer, and Dr. Bernadette Hoit.       Meeting was conducted :       In Person  Yes    Skype   no         Daily Treatment Team consists of the following:       Pt. Cognitive status:  alert and oriented x 3 and good eye contact    Pt. Attending Groups: Yes    Pt. Participating in groups:  Yes    Pt. Homicidal / Suicidal: normal    Pt. Behaviors:  Cooperative    Pt. Compliant with Medications:  Yes          New Medication orders:  Home meds reordered      Discharge Plan:  Home

## 2023-10-25 LAB — CULTURE, URINE: Culture: NO GROWTH

## 2023-10-25 LAB — POCT GLUCOSE
POC Glucose: 138 mg/dL — ABNORMAL HIGH (ref 65–100)
POC Glucose: 120 mg/dL — ABNORMAL HIGH (ref 65–100)

## 2023-10-25 MED FILL — ROPINIROLE HCL 0.25 MG PO TABS: 0.25 MG | ORAL | Qty: 1

## 2023-10-25 MED FILL — CEPHALEXIN 250 MG PO CAPS: 250 MG | ORAL | Qty: 2

## 2023-10-25 MED FILL — PRAVASTATIN SODIUM 40 MG PO TABS: 40 MG | ORAL | Qty: 1

## 2023-10-25 MED FILL — BUSPIRONE HCL 10 MG PO TABS: 10 MG | ORAL | Qty: 3

## 2023-10-25 MED FILL — PANTOPRAZOLE SODIUM 40 MG PO TBEC: 40 MG | ORAL | Qty: 1

## 2023-10-25 MED FILL — METFORMIN HCL 500 MG PO TABS: 500 MG | ORAL | Qty: 1

## 2023-10-25 MED FILL — TOPIRAMATE 25 MG PO TABS: 25 MG | ORAL | Qty: 2

## 2023-10-25 MED FILL — ARIPIPRAZOLE 5 MG PO TABS: 5 MG | ORAL | Qty: 1

## 2023-10-25 MED FILL — CARBIDOPA-LEVODOPA 10-100 MG PO TABS: 10-100 MG | ORAL | Qty: 1

## 2023-10-25 MED FILL — VILAZODONE HCL 20 MG PO TABS: 20 MG | ORAL | Qty: 1

## 2023-10-25 MED FILL — HYDROXYCHLOROQUINE SULFATE 200 MG PO TABS: 200 MG | ORAL | Qty: 1

## 2023-10-25 MED FILL — LISINOPRIL 5 MG PO TABS: 5 MG | ORAL | Qty: 1

## 2023-10-25 NOTE — Progress Notes (Signed)
Psychiatric Progress Note      Patient: Mallory Bonilla MRN: [id number]  SSN: [id number]    Date of Birth: 02-14-1966  Age: 57 y.o.  Sex: female      Admit Date: 10/23/2023       Subjective:     Mallory Bonilla stated she is feeling better with her mood.  Feeling less depressed and anxious.  Denied any suicidal or homicidal ideations this morning.  Attending groups and working coping skills.  Reported good sleep last night.  Denied any side effects of medications.    Objective:     Vitals:    10/24/23 1509 10/25/23 0600 10/25/23 1438 10/25/23 1440   BP: (!) 161/84 (!) 140/69  (!) 153/64   Pulse: 91 89  83   Resp: 16 18  18    Temp: 98.2 F (36.8 C) 97.3 F (36.3 C)  97.1 F (36.2 C)   TempSrc: Temporal Temporal  Temporal   SpO2: 99% 95%  99%   Weight:   88.5 kg (195 lb)    Height:            Mental Status Exam:     Appearance-fairly groomed  Alert, oriented to self and situation  Speech -normal rate, volume and rhythm  Mood-depressed/anxious  Affect-constricted  Thought process-linear and goal directed  Thought content-no delusions   Thought perception-no auditory or visual hallucinations   Suicidal ideations -improving  Insight limited  Judgement Limited    MEDICATIONS:  Current Facility-Administered Medications   Medication Dose Route Frequency    carbidopa-levodopa (SINEMET) 10-100 MG per tablet 1 tablet  1 tablet Oral TID    hydroxychloroquine (PLAQUENIL) tablet 200 mg  200 mg Oral BID    lisinopril (PRINIVIL;ZESTRIL) tablet 5 mg  5 mg Oral Daily    pantoprazole (PROTONIX) tablet 40 mg  40 mg Oral QAM AC    pravastatin (PRAVACHOL) tablet 40 mg  40 mg Oral Nightly    SUMAtriptan (IMITREX) tablet 50 mg  50 mg Oral Once    rOPINIRole (REQUIP) tablet 0.25 mg  0.25 mg Oral TID    glucose chewable tablet 16 g  4 tablet Oral PRN    glucagon injection 1 mg  1 mg SubCUTAneous PRN    insulin lispro (HUMALOG,ADMELOG) injection vial 0-8 Units  0-8 Units SubCUTAneous BID WC    topiramate (TOPAMAX) tablet 50 mg  50 mg Oral  Nightly    cephALEXin (KEFLEX) capsule 500 mg  500 mg Oral 4x daily    metFORMIN (GLUCOPHAGE) tablet 500 mg  500 mg Oral BID WC    SUMAtriptan (IMITREX) tablet 50 mg  50 mg Oral BID PRN    busPIRone (BUSPAR) tablet 30 mg  30 mg Oral BID    vilazodone HCl (VIIBRYD) TABS 20 mg  20 mg Oral Nightly    ARIPiprazole (ABILIFY) tablet 5 mg  5 mg Oral Nightly    acetaminophen (TYLENOL) tablet 650 mg  650 mg Oral Q4H PRN    polyethylene glycol (GLYCOLAX) packet 17 g  17 g Oral Daily PRN    hydrOXYzine HCl (ATARAX) tablet 50 mg  50 mg Oral TID PRN    haloperidol (HALDOL) tablet 5 mg  5 mg Oral Q4H PRN    Or    haloperidol lactate (HALDOL) injection 5 mg  5 mg IntraMUSCular Q4H PRN    diphenhydrAMINE (BENADRYL) injection 50 mg  50 mg IntraMUSCular Q4H PRN    aluminum & magnesium hydroxide-simethicone (MAALOX) 200-200-20 MG/5ML suspension 30 mL  30  mL Oral Q6H PRN        DISCUSSION:  Discussed risk and benefits of medication, provided an opportunity to answer any questions, reviewed discharge goals.    Lab/Data Review:  Recent Results (from the past 24 hour(s))   POCT Glucose    Collection Time: 10/25/23  5:50 AM   Result Value Ref Range    POC Glucose 138 (H) 65 - 100 mg/dL    Performed by: Cranford Mon    POCT Glucose    Collection Time: 10/25/23  4:48 PM   Result Value Ref Range    POC Glucose 120 (H) 65 - 100 mg/dL    Performed by: Tyler Pita            Assessment:     Principal Problem (Resolved):    MDD (major depressive disorder), recurrent episode, mild (HCC)  Active Problems:    Major depressive disorder, recurrent episode, severe (HCC)    Borderline personality disorder (HCC)    Generalized anxiety disorder    Patient admitted for worsening of mood symptoms and suicidal ideations    Plan:     Continue on current medications  Continue with therapy  More collateral information    Signed By: Lajuana Carry MD Psychiatry     October 25, 2023

## 2023-10-25 NOTE — Plan of Care (Signed)
Problem: Discharge Planning  Goal: Discharge to home or other facility with appropriate resources  10/25/2023 1127 by Fortino Sic, RN  Outcome: Progressing  10/25/2023 0017 by Cranford Mon, RN  Outcome: Progressing     Problem: Pain  Goal: Verbalizes/displays adequate comfort level or baseline comfort level  10/25/2023 1127 by Fortino Sic, RN  Outcome: Progressing  10/25/2023 0017 by Cranford Mon, RN  Outcome: Progressing     Problem: Safety - Adult  Goal: Free from fall injury  10/25/2023 1127 by Fortino Sic, RN  Outcome: Progressing  10/25/2023 0017 by Cranford Mon, RN  Outcome: Progressing     Problem: Safety - Adult  Goal: Free from fall injury  10/25/2023 1127 by Fortino Sic, RN  Outcome: Progressing  10/25/2023 0017 by Cranford Mon, RN  Outcome: Progressing

## 2023-10-25 NOTE — Group Note (Signed)
Group Therapy Note    Date: 10/25/2023    Group Start Time: 1000  Group End Time: 1045  Group Topic: Community Meeting    [facility]    Dareen Piano, Arhum Peeples        Group Therapy Note    Attendees: 3       Patient's Goal:  To Talk to the Doctor About Going Home    Notes:      Status After Intervention:  Improved    Participation Level: Active Listener    Participation Quality: Appropriate      Speech:  normal      Thought Process/Content: Logical      Affective Functioning: Congruent      Mood: angry      Level of consciousness:  Alert      Response to Learning: Able to verbalize current knowledge/experience      Endings: None Reported    Modes of Intervention: Support      Discipline Responsible: [employer] Health Tech      Signature:  Inez Pilgrim

## 2023-10-25 NOTE — Behavioral Health Treatment Team (Signed)
B:   Patient alert and oriented x 4.   Pt. States depression is 2.  Pt. States Anxiety is 2.  Pt. denies Hallucinations.  Pt. denies Delusions.  Pt. denies SI.  Pt. denies HI.   Pt. cooperative with Assessment.  Pt.'s behavior Anxious, Cooperative, and Pleasant. Pt can be very needy at times, even after assessment and attempting to talk with roommate pt would interrupt. During medication administration, asked again "why am I taking so many pills, reviewed medication with pt again and she asked where was her Wellbutrin, explained that it wasn't ordered and that I didn't see anything in his notes. Advised pt that this writer would call the doctor and requested not tonight, that she would discuss with him in the morning.     I:    If patient is disoriented, reorient pt.  Build trust with patient, by therapeutic listening and Groups.  Encourage pt. To attend and Participate in Groups.  Provide Medications as ordered and needed.  Encourage pt. To be up for all meals and snacks, and consume all of each. Encourage pt. To interact with staff and peers in a positive manner.  Encourage pt. To keep good hygiene.  Q 15 minute safety checks.     R:   Pt. did attend and Participate in Group.  Pt. Is Compliant with Medications   Yes.   Pt. is getting up for meals and snacks.  Pt. Consumes 75% of Meals.  Pt. is, interacting with Peers.   Pt.'s hygiene is Good.  Pt. does not, have any safety issues.     P:   Pt. Will develop and continue to utilize positive Coping skills.  Pt. Will continue to comply with Plan of Care toward Discharge.  Pt. Will continue to stay safe on the unit.      0545: Pt noted sleeping during making rounds with no distress noted. Will continue to monitor pt for safety.

## 2023-10-25 NOTE — Group Note (Signed)
Group Therapy Note    Date: 10/25/2023    Group Start Time: 2000  Group End Time: 2045  Group Topic: Wrap-Up    [facility]    Shelby Dubin, CNA        Group Therapy Note    Attendees: 2       Patient's Goal:  Goal to see the doctor    Notes:  Participated in group    Status After Intervention:  Improved    Participation Level: Active Listener    Participation Quality: Appropriate      Speech:  normal      Thought Process/Content: Logical      Affective Functioning: Congruent      Mood: anxious and depressed      Level of consciousness:  Alert      Response to Learning: Able to verbalize current knowledge/experience, Able to verbalize/acknowledge new learning, Able to retain information, Capable of insight, Able to change behavior, and Progressing to goal      Endings: None Reported    Modes of Intervention: Activity      Discipline Responsible: [employer] Health Tech      Signature:  Shelby Dubin, CNA

## 2023-10-25 NOTE — Plan of Care (Signed)
Problem: Chronic Conditions and Co-morbidities  Goal: Patient's chronic conditions and co-morbidity symptoms are monitored and maintained or improved  10/25/2023 2159 by Cranford Mon, RN  Outcome: Progressing  10/25/2023 1127 by Fortino Sic, RN  Outcome: Progressing     Problem: Discharge Planning  Goal: Discharge to home or other facility with appropriate resources  10/25/2023 2159 by Cranford Mon, RN  Outcome: Progressing  10/25/2023 1127 by Fortino Sic, RN  Outcome: Progressing     Problem: Pain  Goal: Verbalizes/displays adequate comfort level or baseline comfort level  10/25/2023 2159 by Cranford Mon, RN  Outcome: Progressing  10/25/2023 1127 by Fortino Sic, RN  Outcome: Progressing     Problem: Safety - Adult  Goal: Free from fall injury  10/25/2023 2159 by Cranford Mon, RN  Outcome: Progressing  10/25/2023 1127 by Fortino Sic, RN  Outcome: Progressing     Problem: Self Harm/Suicidality  Goal: Will have no self-injury during hospital stay  Description: INTERVENTIONS:  1.  Ensure constant observer at bedside with Q15M safety checks  2.  Maintain a safe environment  3.  Secure patient belongings  4.  Ensure family/visitors adhere to safety recommendations  5.  Ensure safety tray has been added to patient's diet order  6.  Every shift and PRN: Re-assess suicidal risk via Frequent Screener    10/25/2023 2159 by Cranford Mon, RN  Outcome: Progressing  10/25/2023 1127 by Fortino Sic, RN  Outcome: Progressing     Problem: Depression  Goal: Will be euthymic at discharge  Description: INTERVENTIONS:  1. Administer medication as ordered  2. Provide emotional support via 1:1 interaction with staff  3. Encourage involvement in milieu/groups/activities  4. Monitor for social isolation  10/25/2023 2159 by Cranford Mon, RN  Outcome: Progressing  10/25/2023 1127 by Fortino Sic, RN  Outcome: Progressing     Problem: Anxiety  Goal: Will report anxiety at manageable  levels  Description: INTERVENTIONS:  1. Administer medication as ordered  2. Teach and rehearse alternative coping skills  3. Provide emotional support with 1:1 interaction with staff  10/25/2023 2159 by Cranford Mon, RN  Outcome: Progressing  10/25/2023 1127 by Fortino Sic, RN  Outcome: Progressing     Problem: Sleep Disturbance  Goal: Will exhibit normal sleeping pattern  Description: INTERVENTIONS:  1. Administer medication as ordered  2. Decrease environmental stimuli, including noise, as appropriate  3. Discourage social isolation and naps during the day  10/25/2023 2159 by Cranford Mon, RN  Outcome: Progressing  10/25/2023 1127 by Fortino Sic, RN  Outcome: Progressing     Problem: Self Care Deficit  Goal: Return ADL status to a safe level of function  Description: INTERVENTIONS:  1. Administer medication as ordered  2. Assess ADL deficits and provide assistive devices as needed  3. Obtain PT/OT consults as needed  4. Assist and instruct patient to increase activity and self care as tolerated  10/25/2023 2159 by Cranford Mon, RN  Outcome: Progressing  10/25/2023 1127 by Fortino Sic, RN  Outcome: Progressing

## 2023-10-25 NOTE — Behavioral Health Treatment Team (Signed)
TREATMENT TEAM COMPLETED    Attending meeting was as follows:  Patient, Mallory Bonilla, Therapist, Writer, and Dr. Bernadette Hoit.  Meeting was conducted :     In Person  no      Skype   yes     Daily Treatment Team consists of the following:     Pt. Cognitive status:  alert and oriented x 3 and pleasant and cooperative    Pt. Attending Groups: Yes    Pt. Participating in groups:  Yes    Pt. Homicidal / Suicidal: normal    Pt. Behaviors:  Anxious, Cooperative, and Pleasant    Pt. Compliant with Medications:  Yes  Pt had an issue last night where she had eaten pudding, and she got choked on a pill.  Will continue to monitor for safety with medication administration and if necessary administer pills with applesauce as appropriate.    New Medication orders:  No    Discharge Plan:  Home  Mallory Bonilla is expecting to be discharged home on Tuesday if there are no problems.

## 2023-10-26 LAB — POCT GLUCOSE
POC Glucose: 132 mg/dL — ABNORMAL HIGH (ref 65–100)
POC Glucose: 122 mg/dL — ABNORMAL HIGH (ref 65–100)

## 2023-10-26 MED ORDER — BUPROPION HCL ER (XL) 150 MG PO TB24
150 MG | Freq: Every day | ORAL | Status: AC
Start: 2023-10-26 — End: 2023-10-28
  Administered 2023-10-26 – 2023-10-28 (×3): 150 mg via ORAL

## 2023-10-26 MED FILL — BUSPIRONE HCL 10 MG PO TABS: 10 MG | ORAL | Qty: 3

## 2023-10-26 MED FILL — BUPROPION HCL ER (XL) 150 MG PO TB24: 150 MG | ORAL | Qty: 1

## 2023-10-26 MED FILL — ROPINIROLE HCL 0.25 MG PO TABS: 0.25 MG | ORAL | Qty: 1

## 2023-10-26 MED FILL — CARBIDOPA-LEVODOPA 10-100 MG PO TABS: 10-100 MG | ORAL | Qty: 1

## 2023-10-26 MED FILL — CEPHALEXIN 250 MG PO CAPS: 250 MG | ORAL | Qty: 2

## 2023-10-26 MED FILL — ARIPIPRAZOLE 5 MG PO TABS: 5 MG | ORAL | Qty: 1

## 2023-10-26 MED FILL — HYDROXYZINE HCL 50 MG PO TABS: 50 MG | ORAL | Qty: 1

## 2023-10-26 MED FILL — PANTOPRAZOLE SODIUM 40 MG PO TBEC: 40 MG | ORAL | Qty: 1

## 2023-10-26 MED FILL — PRAVASTATIN SODIUM 40 MG PO TABS: 40 MG | ORAL | Qty: 1

## 2023-10-26 MED FILL — HYDROXYCHLOROQUINE SULFATE 200 MG PO TABS: 200 MG | ORAL | Qty: 1

## 2023-10-26 MED FILL — LISINOPRIL 5 MG PO TABS: 5 MG | ORAL | Qty: 1

## 2023-10-26 MED FILL — METFORMIN HCL 500 MG PO TABS: 500 MG | ORAL | Qty: 1

## 2023-10-26 MED FILL — VILAZODONE HCL 20 MG PO TABS: 20 MG | ORAL | Qty: 1

## 2023-10-26 MED FILL — TOPIRAMATE 25 MG PO TABS: 25 MG | ORAL | Qty: 2

## 2023-10-26 NOTE — Plan of Care (Signed)
Problem: Chronic Conditions and Co-morbidities  Goal: Patient's chronic conditions and co-morbidity symptoms are monitored and maintained or improved  Outcome: Progressing     Problem: Discharge Planning  Goal: Discharge to home or other facility with appropriate resources  Outcome: Progressing     Problem: Pain  Goal: Verbalizes/displays adequate comfort level or baseline comfort level  Outcome: Progressing     Problem: Safety - Adult  Goal: Free from fall injury  Outcome: Progressing     Problem: Depression  Goal: Will be euthymic at discharge  Description: INTERVENTIONS:  1. Administer medication as ordered  2. Provide emotional support via 1:1 interaction with staff  3. Encourage involvement in milieu/groups/activities  4. Monitor for social isolation  Outcome: Progressing     Problem: Anxiety  Goal: Will report anxiety at manageable levels  Description: INTERVENTIONS:  1. Administer medication as ordered  2. Teach and rehearse alternative coping skills  3. Provide emotional support with 1:1 interaction with staff  Outcome: Progressing     Problem: Sleep Disturbance  Goal: Will exhibit normal sleeping pattern  Description: INTERVENTIONS:  1. Administer medication as ordered  2. Decrease environmental stimuli, including noise, as appropriate  3. Discourage social isolation and naps during the day  Outcome: Progressing     Problem: Self Care Deficit  Goal: Return ADL status to a safe level of function  Description: INTERVENTIONS:  1. Administer medication as ordered  2. Assess ADL deficits and provide assistive devices as needed  3. Obtain PT/OT consults as needed  4. Assist and instruct patient to increase activity and self care as tolerated  Outcome: Progressing     Problem: Self Harm/Suicidality  Goal: Will have no self-injury during hospital stay  Description: INTERVENTIONS:  1.  Ensure constant observer at bedside with Q15M safety checks  2.  Maintain a safe environment  3.  Secure patient belongings  4.   Ensure family/visitors adhere to safety recommendations  5.  Ensure safety tray has been added to patient's diet order  6.  Every shift and PRN: Re-assess suicidal risk via Frequent Screener    Outcome: Completed

## 2023-10-26 NOTE — Group Note (Signed)
Group Therapy Note    Date: 10/26/2023    Group Start Time: 1545  Group End Time: 1630  Group Topic: Activity    [facility]    Melida Quitter, LPN        Group Therapy Note    Attendees: 2       Patient's Goal:  UNKNOWN    Notes:  PUZZLE    Status After Intervention:  Improved    Participation Level: Active Listener    Participation Quality: Appropriate and Attentive      Speech:  normal      Thought Process/Content: Logical      Affective Functioning: Congruent      Mood:  CALM      Level of consciousness:  Alert and Oriented x4      Response to Learning: Able to verbalize current knowledge/experience, Able to verbalize/acknowledge new learning, Able to retain information, Capable of insight, Able to change behavior, and Progressing to goal      Endings: None Reported    Modes of Intervention: Socialization and Activity      Discipline Responsible: Licensed Restaurant manager, fast food Health Tech      Signature:  Melida Quitter, LPN

## 2023-10-26 NOTE — Progress Notes (Signed)
Pt has been out of room attending and participating in groups. States she is feeling much better and hopes for discharge on Tuesday. Safe on unit will continue to monitor.

## 2023-10-26 NOTE — Group Note (Signed)
Group Therapy Note    Date: 10/26/2023    Group Start Time: 1045  Group End Time: 1130  Group Topic: Education Group - Inpatient    [facility]    Melida Quitter, LPN        Group Therapy Note    Attendees: 3       Patient's Goal:  UNKNOWN    Notes:  POSITIVE  COPING SKILLS    Status After Intervention:  Improved    Participation Level: Active Listener    Participation Quality: Appropriate and Attentive      Speech:  normal      Thought Process/Content: Logical      Affective Functioning: Congruent      Mood:  CALM      Level of consciousness:  Alert and Oriented x4      Response to Learning: Able to verbalize current knowledge/experience, Able to verbalize/acknowledge new learning, Able to retain information, Capable of insight, Able to change behavior, and Progressing to goal      Endings: None Reported    Modes of Intervention: Education      Discipline Responsible: Licensed Dealer      Signature:  Melida Quitter, LPN

## 2023-10-26 NOTE — Group Note (Signed)
Group Therapy Note    Date: 10/26/2023    Group Start Time: 2000  Group End Time: 2045  Group Topic: Wrap-Up    [facility]    Shelby Dubin, CNA        Group Therapy Note    Attendees: 3       Patient's Goal:  Didn't have a certain goal    Notes:  participated in group    Status After Intervention:  Improved    Participation Level: Active Listener    Participation Quality: Appropriate      Speech:  normal      Thought Process/Content: Logical      Affective Functioning: Congruent      Mood: anxious and depressed      Level of consciousness:  Alert      Response to Learning: Able to verbalize current knowledge/experience, Able to verbalize/acknowledge new learning, Able to retain information, Capable of insight, Able to change behavior, and Progressing to goal      Endings: None Reported    Modes of Intervention: Activity      Discipline Responsible: [employer] Health Tech      Signature:  Shelby Dubin, CNA

## 2023-10-26 NOTE — Behavioral Health Treatment Team (Signed)
B:   Patient alert and oriented x 4.   Pt. States depression is 1.  Pt. States Anxiety is 1.  Pt. denies Hallucinations.  Pt. denies Delusions.  Pt. denies SI.  Pt. denies HI.   Pt. cooperative with Assessment.  Pt.'s behavior Anxious, Cooperative, and Pleasant. Pt smiling and laughing with roommate. Expressed feeling much better, ready to go home and clean and sign up for bingo. Still knows she has issues with being around a large amount of people but with faith she said anything is possible.     I:    If patient is disoriented, reorient pt.  Build trust with patient, by therapeutic listening and Groups.  Encourage pt. To attend and Participate in Groups.  Provide Medications as ordered and needed.  Encourage pt. To be up for all meals and snacks, and consume all of each. Encourage pt. To interact with staff and peers in a positive manner.  Encourage pt. To keep good hygiene.  Q 15 minute safety checks.     R:   Pt. did attend and Participate in Group.  Pt. Is Compliant with Medications   Yes.   Pt. is getting up for meals and snacks.  Pt. Consumes 75% of Meals.  Pt. is, interacting with Peers.   Pt.'s hygiene is Good.  Pt. does not, have any safety issues.     P:   Pt. Will develop and continue to utilize positive Coping skills.  Pt. Will continue to comply with Plan of Care toward Discharge.  Pt. Will continue to stay safe on the unit.      1610: Pt noted sleeping during making rounds with no distress noted. Will continue to monitor pt for safety.

## 2023-10-26 NOTE — Group Note (Signed)
Group Therapy Note    Date: 10/26/2023    Group Start Time: 1000  Group End Time: 1045  Group Topic: Community Meeting    [facility]    Melida Quitter, LPN        Group Therapy Note    Attendees: 3       Patient's Goal:  UNKNOWN    Notes:  COMMUNITY    Status After Intervention:  Improved    Participation Level: Active Listener    Participation Quality: Appropriate and Attentive      Speech:  normal      Thought Process/Content: Logical      Affective Functioning: Congruent      Mood:  CALM      Level of consciousness:  Alert and Oriented x4      Response to Learning: Able to verbalize current knowledge/experience, Able to verbalize/acknowledge new learning, Able to retain information, Capable of insight, Able to change behavior, and Progressing to goal      Endings: None Reported    Modes of Intervention: Education      Discipline Responsible: Licensed Psychologist, prison and probation services      Signature:  Melida Quitter, LPN

## 2023-10-26 NOTE — Behavioral Health Treatment Team (Signed)
B:   Patient alert and oriented x 4.   Pt. States depression is Low.  Pt. States Anxiety is Low.  Pt. denies Hallucinations.  Pt. denies Delusions.  Pt. denies SI.  Pt. denies HI.   Pt. cooperative with Assessment.  Pt.'s behavior Cooperative and Pleasant.   States she is feeling much better.    I:    If patient is disoriented, reorient pt.  Build trust with patient, by therapeutic listening and Groups.  Encourage pt. To attend and Participate in Groups.  Provide Medications as ordered and needed.  Encourage pt. To be up for all meals and snacks, and consume all of each. Encourage pt. To interact with staff and peers in a positive manner.  Encourage pt. To keep good hygiene.  Q 15 minute safety checks.    R:   Pt. did attend and Participate in Group.  Pt. Is Compliant with Medications   Yes.   Pt. is getting up for meals and snacks.  Pt. Consumes 75% of Meals.  Pt. is, interacting with Peers.   Pt.'s hygiene is Good.  Pt. does, have any safety issues.    P:   Pt. Will develop and continue to utilize positive Coping skills.  Pt. Will continue to comply with Plan of Care toward Discharge.  Pt. Will continue to stay safe on the unit.

## 2023-10-26 NOTE — Progress Notes (Signed)
Psychiatric Progress Note      Patient: Mallory Bonilla MRN: [id number]  SSN: [id number]    Date of Birth: 11-28-1966  Age: 57 y.o.  Sex: female      Admit Date: 10/23/2023       Subjective:     Mallory Bonilla stated that she is feeling better with her mood.  Continues to low energy levels.  Attending groups and working on coping skills.  Denied any suicidal or homicidal ideations.  Reported good sleep and appetite.  Denies side effects of medications.    Objective:     Vitals:    10/25/23 1438 10/25/23 1440 10/26/23 0609 10/26/23 0838   BP:  (!) 153/64 (!) 158/80 (!) 158/80   Pulse:  83 93    Resp:  18 17    Temp:  97.1 F (36.2 C) 97.3 F (36.3 C)    TempSrc:  Temporal Temporal    SpO2:  99% 96%    Weight: 88.5 kg (195 lb)      Height:            Mental Status Exam:     Appearance-fairly groomed  Alert, oriented to self and situation  Speech -normal rate, volume and rhythm  Mood-depressed/anxious  Affect-constricted  Thought process-linear and goal directed  Thought content-no delusions   Thought perception-no auditory or visual hallucinations   Suicidal ideations -improving  Insight limited  Judgement Limited    MEDICATIONS:  Current Facility-Administered Medications   Medication Dose Route Frequency    carbidopa-levodopa (SINEMET) 10-100 MG per tablet 1 tablet  1 tablet Oral TID    hydroxychloroquine (PLAQUENIL) tablet 200 mg  200 mg Oral BID    lisinopril (PRINIVIL;ZESTRIL) tablet 5 mg  5 mg Oral Daily    pantoprazole (PROTONIX) tablet 40 mg  40 mg Oral QAM AC    pravastatin (PRAVACHOL) tablet 40 mg  40 mg Oral Nightly    SUMAtriptan (IMITREX) tablet 50 mg  50 mg Oral Once    rOPINIRole (REQUIP) tablet 0.25 mg  0.25 mg Oral TID    glucose chewable tablet 16 g  4 tablet Oral PRN    glucagon injection 1 mg  1 mg SubCUTAneous PRN    insulin lispro (HUMALOG,ADMELOG) injection vial 0-8 Units  0-8 Units SubCUTAneous BID WC    topiramate (TOPAMAX) tablet 50 mg  50 mg Oral Nightly    cephALEXin (KEFLEX) capsule 500  mg  500 mg Oral 4x daily    metFORMIN (GLUCOPHAGE) tablet 500 mg  500 mg Oral BID WC    SUMAtriptan (IMITREX) tablet 50 mg  50 mg Oral BID PRN    busPIRone (BUSPAR) tablet 30 mg  30 mg Oral BID    vilazodone HCl (VIIBRYD) TABS 20 mg  20 mg Oral Nightly    ARIPiprazole (ABILIFY) tablet 5 mg  5 mg Oral Nightly    acetaminophen (TYLENOL) tablet 650 mg  650 mg Oral Q4H PRN    polyethylene glycol (GLYCOLAX) packet 17 g  17 g Oral Daily PRN    hydrOXYzine HCl (ATARAX) tablet 50 mg  50 mg Oral TID PRN    haloperidol (HALDOL) tablet 5 mg  5 mg Oral Q4H PRN    Or    haloperidol lactate (HALDOL) injection 5 mg  5 mg IntraMUSCular Q4H PRN    diphenhydrAMINE (BENADRYL) injection 50 mg  50 mg IntraMUSCular Q4H PRN    aluminum & magnesium hydroxide-simethicone (MAALOX) 200-200-20 MG/5ML suspension 30 mL  30 mL Oral Q6H PRN  DISCUSSION:  Discussed risk and benefits of medication, provided an opportunity to answer any questions, reviewed discharge goals.    Lab/Data Review:  Recent Results (from the past 24 hour(s))   POCT Glucose    Collection Time: 10/25/23  4:48 PM   Result Value Ref Range    POC Glucose 120 (H) 65 - 100 mg/dL    Performed by: Tyler Pita    POCT Glucose    Collection Time: 10/26/23  5:51 AM   Result Value Ref Range    POC Glucose 132 (H) 65 - 100 mg/dL    Performed by: Cranford Mon            Assessment:     Principal Problem (Resolved):    MDD (major depressive disorder), recurrent episode, mild (HCC)  Active Problems:    Major depressive disorder, recurrent episode, severe (HCC)    Borderline personality disorder (HCC)    Generalized anxiety disorder    Patient admitted for worsening of mood symptoms and suicidal ideations    Plan:     Continue on current medications and start on Wellbutrin XL 150 mg p.o. daily  Continue with therapy  More collateral information    Signed By: Lajuana Carry MD Psychiatry     October 26, 2023

## 2023-10-26 NOTE — Behavioral Health Treatment Team (Signed)
TREATMENT TEAM COMPLETED     Attending meeting was as follows:  Patient, Royann Shivers RN, Writer, and Dr. Bernadette Hoit.       Meeting was conducted :       In Person  no      Skype   yes         Daily Treatment Team consists of the following:       Pt. Cognitive status:  alert and oriented x 3, pleasant and cooperative, good eye contact, and well groomed    Pt. Attending Groups: Yes    Pt. Participating in groups:  Yes    Pt. Homicidal / Suicidal: normal    Pt. Behaviors:  Cooperative and Pleasant    Pt. Compliant with Medications:  Yes          New Medication orders:  Yes Wellbutrin XL 150 ng daily      Discharge Plan:  Home

## 2023-10-27 LAB — POCT GLUCOSE
POC Glucose: 129 mg/dL — ABNORMAL HIGH (ref 65–100)
POC Glucose: 151 mg/dL — ABNORMAL HIGH (ref 65–100)

## 2023-10-27 MED ORDER — CEPHALEXIN 500 MG PO CAPS
500 | ORAL_CAPSULE | Freq: Four times a day (QID) | ORAL | 0 refills | Status: AC
Start: 2023-10-27 — End: 2023-10-29

## 2023-10-27 MED ORDER — BUPROPION HCL ER (XL) 150 MG PO TB24
150 | ORAL_TABLET | Freq: Every day | ORAL | 0 refills | Status: AC
Start: 2023-10-27 — End: 2023-11-27

## 2023-10-27 MED ORDER — BUSPIRONE HCL 30 MG PO TABS
30 | ORAL_TABLET | Freq: Two times a day (BID) | ORAL | 0 refills | Status: AC
Start: 2023-10-27 — End: 2023-11-26

## 2023-10-27 MED ORDER — HYDROXYZINE HCL 50 MG PO TABS
50 | ORAL_TABLET | Freq: Three times a day (TID) | ORAL | 0 refills | Status: AC | PRN
Start: 2023-10-27 — End: 2023-11-06

## 2023-10-27 MED FILL — HYDROXYCHLOROQUINE SULFATE 200 MG PO TABS: 200 MG | ORAL | Qty: 1

## 2023-10-27 MED FILL — CARBIDOPA-LEVODOPA 10-100 MG PO TABS: 10-100 MG | ORAL | Qty: 1

## 2023-10-27 MED FILL — CEPHALEXIN 250 MG PO CAPS: 250 MG | ORAL | Qty: 2

## 2023-10-27 MED FILL — ARIPIPRAZOLE 5 MG PO TABS: 5 MG | ORAL | Qty: 1

## 2023-10-27 MED FILL — BUSPIRONE HCL 10 MG PO TABS: 10 MG | ORAL | Qty: 3

## 2023-10-27 MED FILL — LISINOPRIL 5 MG PO TABS: 5 MG | ORAL | Qty: 1

## 2023-10-27 MED FILL — PANTOPRAZOLE SODIUM 40 MG PO TBEC: 40 MG | ORAL | Qty: 1

## 2023-10-27 MED FILL — ROPINIROLE HCL 0.25 MG PO TABS: 0.25 MG | ORAL | Qty: 1

## 2023-10-27 MED FILL — METFORMIN HCL 500 MG PO TABS: 500 MG | ORAL | Qty: 1

## 2023-10-27 MED FILL — VILAZODONE HCL 20 MG PO TABS: 20 MG | ORAL | Qty: 1

## 2023-10-27 MED FILL — TOPIRAMATE 25 MG PO TABS: 25 MG | ORAL | Qty: 2

## 2023-10-27 MED FILL — PRAVASTATIN SODIUM 40 MG PO TABS: 40 MG | ORAL | Qty: 1

## 2023-10-27 MED FILL — HYDROXYZINE HCL 50 MG PO TABS: 50 MG | ORAL | Qty: 1

## 2023-10-27 MED FILL — BUPROPION HCL ER (XL) 150 MG PO TB24: 150 MG | ORAL | Qty: 1

## 2023-10-27 NOTE — Behavioral Health Treatment Team (Signed)
DISCHARGE SUMMARY    NAME:Mallory Bonilla  DOB: 10/24/1966  MRN: [id number]    The patient Mallory Bonilla exhibits the ability to control behavior in a less restrictive environment.  Patient's level of functioning is improving.  No assaultive/destructive behavior has been observed for the past 24 hours.  No suicidal/homicidal threat or behavior has been observed for the past 24 hours.  There is no evidence of serious medication side effects.  Patient has not been in physical or protective restraints for at least the past 24 hours.    If weapons involved, how are they secured? Guns locked away    Is patient aware of and in agreement with discharge plan? Yes    Arrangements for medication:  Prescriptions On file    Copy of discharge instructions to provider?:  Yes    Arrangements for transportation home:  Pt will be picked up and taken home by friend.     Keep all follow up appointments as scheduled, continue to take prescribed medications per physician instructions.  Mental health crisis number:  911 or your local mental health crisis line number at [phone]      Mental Health Emergency WARM LINE      1-866-400-MHAV [phone])      M-F: 9am to 9pm      Sat & Sun: 5pm - 9pm  National suicide prevention lines:                             1-800-SUICIDE 930-760-0780)       1-800-273-TALK 320-196-1037)   24/7 Crisis Text Line:  Text HOME to [phone]

## 2023-10-27 NOTE — Group Note (Signed)
Group Therapy Note    Date: 10/27/2023    Group Start Time: 1430  Group End Time: 1515  Group Topic: Psychoeducation    [facility]    Pola Furno        Group Therapy Note    Attendees: 4/6    Writer facilitated a psych-ed group. Writer provided a handout regarding the cycle of depression. Writer encouraged patients to process and ask questions about the various parts of the cycle such as stressors, thoughts, feelings, etc. Writer held the space as patients processed. Writer concluded session with a grounding exercise.        Patient's Goal:  To attend and participate in groups and activities daily.    Notes:  Pt actively participated. Pt was able to discuss the examples and cycle of depression.     Status After Intervention:  Improved    Participation Level: Active Listener and Interactive    Participation Quality: Appropriate, Attentive, and Sharing      Speech:  normal      Thought Process/Content: Logical  Linear      Affective Functioning: Congruent      Mood: euthymic      Level of consciousness:  Alert, Oriented x4, and Attentive      Response to Learning: Able to retain information, Capable of insight, and Progressing to goal      Endings: None Reported    Modes of Intervention: Education      Discipline Responsible: Social Worker/Counselor      Signature:  Cytogeneticist, [employer]

## 2023-10-27 NOTE — Consults (Cosign Needed)
Nutrition Education    Educated on GERD and s/p Esophageal Surgery  Learners: Patient  Readiness: Eager  Method: Explanation and Handout  Response: Verbalizes Understanding  Contact name and number provided.    Pt s/p esophageal dilation x2wks ago pta. Consulted for esophageal dysphagia edu. Pt reported difficulty swallowing pills. Not currently being followed by SLP. Reviewed edu and encouraged a GI soft diet @ d/c, plans for d/c tomorrow. Encouraged pt to f/u w/ GI for any concerns, s/s and to request OP SLP eval here at [facility] if swallowing issues continue. Pt receptive.     Roland Earl  Contact Number: ext [phone] or via [employer]

## 2023-10-27 NOTE — Group Note (Signed)
Group Therapy Note    Date: 10/27/2023    Group Start Time: 2000  Group End Time: 2045  Group Topic: Wrap-Up    [facility]    Estrella Myrtle A        Group Therapy Note    Attendees: 2         Patient's Goal:  n/a    Notes:  happy    Status After Intervention:  Improved    Participation Level: Active Listener    Participation Quality: Supportive      Speech:  normal      Thought Process/Content: Logical      Affective Functioning: Congruent      Mood:  happy      Level of consciousness:  Alert      Response to Learning: Able to verbalize current knowledge/experience      Endings: None Reported    Modes of Intervention: Socialization      Discipline Responsible: Microbiologist Health Tech      Signature:  Joycelyn Das

## 2023-10-27 NOTE — Group Note (Signed)
Group Therapy Note    Date: 10/27/2023    Group Start Time: 1345  Group End Time: 1430  Group Topic: Process Group - Inpatient    [facility]    Schyler Butikofer        Group Therapy Note    Attendees: 3/6    Writer facilitated an inpatient processing group. Writer had patients engage in a reflection activity in which they were asked to reflect on the various love languages (acts of service, etc.). Writer encouraged patients to process various ways they could implement self care and how they can implement the love languages towards themselves. Writer encouraged patients to express feelings, discuss discharge plans, etc. Writer held the space as patients processed. Writer concluded session with a grounding exercise.        Patient's Goal:  To attend and participate in groups and activities daily    Notes:  Pt actively participated. Pt was able to discuss discharge plans and engaged in reflection for self-care.    Status After Intervention:  Improved    Participation Level: Active Listener and Interactive    Participation Quality: Appropriate, Attentive, and Sharing      Speech:  normal      Thought Process/Content: Logical  Linear      Affective Functioning: Congruent      Mood: euthymic      Level of consciousness:  Alert, Oriented x4, and Attentive      Response to Learning: Able to retain information, Capable of insight, and Progressing to goal      Endings: None Reported    Modes of Intervention: Exploration and Activity      Discipline Responsible: Social Worker/Counselor      Signature:  Serena Croissant, [employer]
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
## 2023-10-27 NOTE — Care Coordination-Inpatient (Signed)
10/27/23 0919   ITP   Date of Plan 10/27/23   Date of Next Review 11/03/23   Primary Diagnosis Code Major depressive disorder   Barriers to Treatment Need for psychoeducation;Psychiatric symptom (comment)   Strengths Incorporated in Plan Acknowledging need for assistance;Community supports;Family supports;Natural supports;Postive outlook;Seeking interactions   Plan of Care   Long Term Goal (LTG) Stated in patient/guardian terms On PSA   Short Term Goal 1   Short Term Goal 1 Client will learn and demonstrate improved self management skills   Baseline Functioning Unknown   Target TBD   Objectives Client will participate in individual therapy;Client will participate in group therapy   Intervention 1 Acknowledge client strengths   Frequency Daily   Measured by Behavioral data;Self report;Staff observation   Staff Responsible [facility] staff;Clinical staff   Intervention 2 Referral to community services   Frequency Weekly   Measured by Behavioral data;Self report;Staff observation   Staff Responsible [facility] staff;Clinical staff   Intervention 3 Group therapy   Frequency Daily   Measured by Behavioral data;Self report;Staff observation   Staff Responsible [facility] staff;Clinical staff   STG Goal 1 Status: Patient Appears to be  Partially meeting treatment plan goal   Short Term Goal 2   Short Term Goal 2 Client will maintain compliance with medication regime   Baseline Functioning Unknown   Target TBD   Objectives Client will participate in individual therapy;Client will participate in group therapy   Intervention 1 Referral to community services   Frequency Weekly   Measured by Behavioral data;Self report;Staff observation   Staff Responsible Clinical staff;[facility] staff   Intervention 2 Monitor medications   Frequency Daily   Measured by Behavioral data;Self report;Staff observation   Staff Responsible [facility] staff   STG Goal 2 Status: Patient Appears to be  Partially meeting treatment plan goal   Crisis/Safety/Discharge Plan    Crisis/Safety Plan Standard program interventions and protocol   Comprehensive Assessment Completion Date 10/27/23   Discharge Plan Home with outpatient

## 2023-10-27 NOTE — Discharge Summary (Incomplete)
PSYCHIATRIC DISCHARGE SUMMARY         IDENTIFICATION:    Patient Name  Mallory Bonilla   Date of Birth 11/25/1966   CSN [id number]   Medical Record Number  [id number]      Age  57 y.o.   PCP Catarina Hartshorn, APRN - NP   Admit date:  10/23/2023    Discharge date: 10/27/2023   Room Number  101/01  @ [facility]   Date of Service  10/27/2023            TYPE OF DISCHARGE: REGULAR               CONDITION AT DISCHARGE: Improved       PROVISIONAL & DISCHARGE DIAGNOSES:    Admission Diagnoses:   Acute cystitis without hematuria [N30.00]  MDD (major depressive disorder), recurrent episode, mild (HCC) [F33.0]  Depression with suicidal ideation [F32.A, R45.851]    Discharge Diagnoses:      Hospital Problems             Last Modified POA    Major depressive disorder, recurrent episode, severe (HCC) 10/24/2023 Yes    Borderline personality disorder (HCC) 10/24/2023 Yes    Generalized anxiety disorder 10/24/2023 Yes             CC & HISTORY OF PRESENT ILLNESS:  The patient, Mallory Bonilla, is a 57 y.o.  White (non-Hispanic) female with a past psychiatric history significant for major depressive disorder, generalized anxiety disorder and borderline personality disorder admitted to [facility] for worsening of mood symptoms and suicidal ideations.  Stated she is going a lot of stress related to medical problems and feeling more isolated after her daughter moved out for college.  She reported feeling more depressed for the last few weeks.  She also report having suicidal ideation with a plan to overdose on medication.  Reported sleeping a lot recently.  Reported low energy levels and low motivation to do things.  Endorsed anhedonia.  Not able to do things she used to do due to her arthritis.  Feeling hopeless.  Reported low appetite.  Denied any homicidal ideations.  She also reported feeling more anxious recently.  Denied any recent panic attacks.  Patient with trauma history but  denied any symptoms related to PTSD.  Denied any paranoid thoughts, troubles or hallucinations.  Denied any symptoms related to mania.  Reported taking her medications as recommended and denied any side effects of medications.        HOSPITALIZATION COURSE:    Vanecia Grime was admitted to the inpatient psychiatric unit [facility] for acute psychiatric stabilization in regards to symptomatology as described in the HPI above. Patient admitted for worsening of mood symptoms and suicidal ideations.  She was monitored with suicide precautions.  She was started on Wellbutrin XL 150 mg p.o. daily.  She attended groups and worked on Pharmacologist.  Overall significant improvement with her mood symptoms compared to admission.  She consistently denied any suicidal ideations.          LABS AND IMAGAING:    Labs Reviewed   CBC WITH AUTO DIFFERENTIAL - Abnormal; Notable for the following components:       Result Value    RBC 5.67 (*)     MCV 77.4 (*)     MCH 25.2 (*)     All other components within normal limits   BASIC METABOLIC PANEL - Abnormal; Notable for  the following components:    Glucose 123 (*)     BUN/Creatinine Ratio 9 (*)     All other components within normal limits   URINALYSIS - Abnormal; Notable for the following components:    Specific Gravity, UA >1.030 (*)     Protein, UA Trace (*)     Ketones, Urine Trace (*)     Leukocyte Esterase, Urine Moderate (*)     All other components within normal limits   ACETAMINOPHEN LEVEL - Abnormal; Notable for the following components:    Acetaminophen Level <2 (*)     All other components within normal limits   URINALYSIS, MICRO - Abnormal; Notable for the following components:    BACTERIA, URINE 3+ (*)     All other components within normal limits   HEMOGLOBIN A1C - Abnormal; Notable for the following components:    Hemoglobin A1C 6.2 (*)     All other components within normal limits   LIPID PANEL - Abnormal; Notable for the following  components:    Triglycerides 151 (*)     All other components within normal limits   POCT GLUCOSE - Abnormal; Notable for the following components:    POC Glucose 113 (*)     All other components within normal limits   POCT GLUCOSE - Abnormal; Notable for the following components:    POC Glucose 158 (*)     All other components within normal limits   POCT GLUCOSE - Abnormal; Notable for the following components:    POC Glucose 138 (*)     All other components within normal limits   POCT GLUCOSE - Abnormal; Notable for the following components:    POC Glucose 120 (*)     All other components within normal limits   POCT GLUCOSE - Abnormal; Notable for the following components:    POC Glucose 132 (*)     All other components within normal limits   POCT GLUCOSE - Abnormal; Notable for the following components:    POC Glucose 122 (*)     All other components within normal limits   POCT GLUCOSE - Abnormal; Notable for the following components:    POC Glucose 129 (*)     All other components within normal limits   CULTURE, URINE   ETHANOL   URINE DRUG SCREEN   SALICYLATE LEVEL   POCT GLUCOSE   POCT GLUCOSE   POCT GLUCOSE   POCT GLUCOSE   POCT GLUCOSE   POCT GLUCOSE   POCT GLUCOSE   POCT GLUCOSE     No results found for: "PHEN", "PHENO", "PHENY", "PTN", "VALAC", "VALP", "CARB2"  Admission on 10/23/2023   Component Date Value Ref Range Status    WBC 10/23/2023 7.0  3.6 - 11.0 K/uL Final    RBC 10/23/2023 5.67 (H)  3.80 - 5.20 M/uL Final    Hemoglobin 10/23/2023 14.3  11.5 - 16.0 g/dL Final    Hematocrit 45/40/9811 43.9  35.0 - 47.0 % Final    MCV 10/23/2023 77.4 (L)  80.0 - 99.0 FL Final    MCH 10/23/2023 25.2 (L)  26.0 - 34.0 PG Final    MCHC 10/23/2023 32.6  30.0 - 36.5 g/dL Final    RDW 91/47/8295 14.3  11.5 - 14.5 % Final    Platelets 10/23/2023 179  150 - 400 K/uL Final    MPV 10/23/2023 10.6  8.9 - 12.9 FL Final    Nucleated RBCs 10/23/2023 0.0  0.0 PER 100 WBC Final  nRBC 10/23/2023 0.00  0.00 - 0.01 K/uL Final     Neutrophils % 10/23/2023 60  32 - 75 % Final    Lymphocytes % 10/23/2023 32  12 - 49 % Final    Monocytes % 10/23/2023 6  5 - 13 % Final    Eosinophils % 10/23/2023 1  0 - 7 % Final    Basophils % 10/23/2023 1  0 - 1 % Final    Immature Granulocytes % 10/23/2023 0  0 - 0.5 % Final    Neutrophils Absolute 10/23/2023 4.1  1.8 - 8.0 K/UL Final    Lymphocytes Absolute 10/23/2023 2.2  0.8 - 3.5 K/UL Final    Monocytes Absolute 10/23/2023 0.4  0.0 - 1.0 K/UL Final    Eosinophils Absolute 10/23/2023 0.1  0.0 - 0.4 K/UL Final    Basophils Absolute 10/23/2023 0.1  0.0 - 0.1 K/UL Final    Immature Granulocytes Absolute 10/23/2023 0.0  0.00 - 0.04 K/UL Final    Differential Type 10/23/2023 AUTOMATED    Final    Sodium 10/23/2023 144  136 - 145 mmol/L Final    Potassium 10/23/2023 3.5  3.5 - 5.1 mmol/L Final    Chloride 10/23/2023 108  97 - 108 mmol/L Final    CO2 10/23/2023 24  21 - 32 mmol/L Final    Anion Gap 10/23/2023 12  2 - 12 mmol/L Final    Glucose 10/23/2023 123 (H)  65 - 100 mg/dL Final    BUN 40/98/1191 8  6 - 20 mg/dL Final    Creatinine 47/82/9562 0.92  0.55 - 1.02 mg/dL Final    BUN/Creatinine Ratio 10/23/2023 9 (L)  12 - 20   Final    Est, Glom Filt Rate 10/23/2023 72  >60 ml/min/1.91m2 Final    Calcium 10/23/2023 9.5  8.5 - 10.1 mg/dL Final    Ethanol Lvl 13/07/6577 <10  <10 mg/dL Final    Amphetamine, Urine 10/23/2023 Negative  Negative   Final    Barbiturates, Urine 10/23/2023 Negative  Negative   Final    Benzodiazepines, Urine 10/23/2023 Negative  Negative   Final    Cocaine, Urine 10/23/2023 Negative  Negative   Final    MDMA, Urine 10/23/2023 Negative  Negative   Final    Methadone, Urine 10/23/2023 Negative  Negative   Final    Opiates, Urine 10/23/2023 Negative  Negative   Final    Phencyclidine, Urine 10/23/2023 Negative  Negative   Final    THC, TH-Cannabinol, Urine 10/23/2023 Negative  Negative   Final    Comments: 10/23/2023     Final                    Value:This test is a screen for drugs of abuse  in a medical setting only (i.e., they are unconfirmed results and as such must not be used for non-medical purposes, e.g.,employment testing, legal testing). Due to its inherent nature, false positive (FP) and false negative (FN) results may be obtained. Therefore, if necessary for medical care, recommend confirmation of positive findings by GC/MS.      Color, UA 10/23/2023 Yellow/Straw    Final    Appearance 10/23/2023 Clear  Clear   Final    Specific Gravity, UA 10/23/2023 >1.030 (H)  1.003 - 1.030 Final    pH, Urine 10/23/2023 6.0  5.0 - 8.0   Final    Protein, UA 10/23/2023 Trace (A)  Negative mg/dL Final    Glucose, Ur 46/96/2952 Negative  Negative mg/dL Final    Ketones, Urine 10/23/2023 Trace (A)  Negative mg/dL Final    Bilirubin, Urine 10/23/2023 Negative  Negative   Final    Blood, Urine 10/23/2023 Negative  Negative   Final    Urobilinogen, Urine 10/23/2023 0.2  0.2 - 1.0 EU/dL Final    Nitrite, Urine 10/23/2023 Negative  Negative   Final    Leukocyte Esterase, Urine 10/23/2023 Moderate (A)  Negative   Final    Acetaminophen Level 10/23/2023 <2 (L)  10 - 30 ug/mL Final    DOSE DATE 10/23/2023 Not provided    Final    DOSE AMOUNT 10/23/2023 Not provided  Units Final    Salicylate Lvl 10/23/2023 2.9  2.8 - 20.0 mg/dL Final    DOSE DATE 69/62/9528 Not provided    Final    DOSE AMOUNT 10/23/2023 Not provided  Units Final    WBC, UA 10/23/2023 50-100  0 - 5 /hpf Final    RBC, UA 10/23/2023 0-5  0 - 3 /hpf Final    BACTERIA, URINE 10/23/2023 3+ (A)  Negative /hpf Final    Hemoglobin A1C 10/23/2023 6.2 (H)  4.0 - 5.6 % Final    Estimated Avg Glucose 10/23/2023 131  mg/dL Final    LIPID PANEL 41/32/4401      Final    Cholesterol, Total 10/23/2023 159  <200 mg/dL Final    Triglycerides 10/23/2023 151 (H)  <150 mg/dL Final    HDL 02/72/5366 50  mg/dL Final    LDL Cholesterol 10/23/2023 78.8  0 - 100 mg/dL Final    VLDL Cholesterol Calculated 10/23/2023 30.2  mg/dL Final    Chol/HDL Ratio 10/23/2023 3.2  0.0 - 5.0    Final    POC Glucose 10/24/2023 113 (H)  65 - 100 mg/dL Final    Performed by: 10/24/2023 Tyler Pita   Final    Special Requests 10/23/2023 No Special Requests    Final    Culture 10/23/2023 No Growth (<1000 cfu/mL)    Final    POC Glucose 10/24/2023 158 (H)  65 - 100 mg/dL Final    Performed by: 10/24/2023 Polo Riley JENNIFER   Final    POC Glucose 10/25/2023 138 (H)  65 - 100 mg/dL Final    Performed by: 10/25/2023 Cranford Mon   Final    POC Glucose 10/25/2023 120 (H)  65 - 100 mg/dL Final    Performed by: 10/25/2023 Polo Riley JENNIFER   Final    POC Glucose 10/26/2023 132 (H)  65 - 100 mg/dL Final    Performed by: 10/26/2023 Cranford Mon   Final    POC Glucose 10/26/2023 122 (H)  65 - 100 mg/dL Final    Performed by: 10/26/2023 Vista Lawman FATIMA   Final    POC Glucose 10/27/2023 129 (H)  65 - 100 mg/dL Final    Performed by: 10/27/2023 Cranford Mon   Final   Admission on 10/14/2023, Discharged on 10/14/2023   Component Date Value Ref Range Status    POC Glucose 10/14/2023 135 (H)  65 - 100 mg/dL Final    Performed by: 10/14/2023 Libourel Deborah   Final     No results found.                DISPOSITION:    Home. Patient to f/u with psychiatric and psychotherapy appointments. Patient is to f/u with internist/PCP as directed in the next 1-2 weeks.  They will return to the ED if problems worsen or return.  Discharge risk assessment has been performed.               FOLLOW-UP CARE:    Activity as tolerated  Diabetic diet             PROGNOSIS:   Fair - based on nature of patient's pathology/ies and treatment compliance issues.  Prognosis is greatly dependent upon patient's ability follow discharge recommendations, take medications as described, and follow up with scheduled appointments.             DISCHARGE MEDICATIONS:     Informed consent given for the use of following psychotropic medications:     Medication List        START taking these medications      buPROPion 150 MG extended release tablet  Commonly known as:  WELLBUTRIN XL  Take 1 tablet by mouth daily  Start taking on: October 28, 2023     cephALEXin 500 MG capsule  Commonly known as: KEFLEX  Take 1 capsule by mouth in the morning, at noon, in the evening, and at bedtime for 8 doses     hydrOXYzine HCl 50 MG tablet  Commonly known as: ATARAX  Take 1 tablet by mouth 3 times daily as needed for Anxiety     rOPINIRole 0.25 MG tablet  Commonly known as: REQUIP            CHANGE how you take these medications      busPIRone 30 MG tablet  Commonly known as: BUSPAR  Take 30 mg by mouth 2 times daily  What changed: See the new instructions.            CONTINUE taking these medications      ARIPiprazole 5 MG tablet  Commonly known as: ABILIFY     carbidopa-levodopa 10-100 MG per tablet  Commonly known as: SINEMET     Emgality 120 MG/ML Soaj  Generic drug: Galcanezumab-gnlm     hydroxychloroquine 200 MG tablet  Commonly known as: PLAQUENIL     lisinopril 5 MG tablet  Commonly known as: PRINIVIL;ZESTRIL     metFORMIN 500 MG tablet  Commonly known as: GLUCOPHAGE     pantoprazole 40 MG tablet  Commonly known as: PROTONIX  Take 1 tablet by mouth once daily     pravastatin 40 MG tablet  Commonly known as: PRAVACHOL     rizatriptan 5 MG tablet  Commonly known as: MAXALT     topiramate 50 MG tablet  Commonly known as: TOPAMAX     True Metrix Blood Glucose Test strip  Generic drug: blood glucose test strips     Trulicity 0.75 MG/0.5ML Sopn SC injection  Generic drug: dulaglutide     vilazodone HCl 20 MG Tabs  Commonly known as: VIIBRYD     Vitamin D3 1.25 MG (50000 UT) Caps            STOP taking these medications      clonazePAM 1 MG tablet  Commonly known as: KLONOPIN     ibuprofen 800 MG tablet  Commonly known as: ADVIL;MOTRIN     influenza quadrivalent split vaccine 0.5 ML injection  Commonly known as: FLUZONE;FLUARIX;FLULAVAL;AFLURIA            ASK your doctor about these medications      estradiol-norethindrone 1-0.5 MG per tablet  Commonly known as: ACTIVELLA  Take 1 tablet by  mouth daily     Tradjenta 5 MG tablet  Generic drug: linagliptin  Where to Get Your Medications        These medications were sent to [facility] [address] [place] - [address] - P [phone] Charlyne Petrin  [address], [place] [address]      Phone: [phone]   buPROPion 150 MG extended release tablet  busPIRone 30 MG tablet  cephALEXin 500 MG capsule  hydrOXYzine HCl 50 MG tablet                A coordinated, multidisplinary treatment team meeting was conducted with Marvis Repress at [facility]. This team consists of the nurse and social worker.         Signed:  Lajuana Carry MD Psychiatry

## 2023-10-27 NOTE — Behavioral Health Treatment Team (Signed)
TREATMENT TEAM COMPLETED     Attending meeting was as follows:  Patient, Mallory Bonilla, Therapist, Writer, and Dr. Bernadette Hoit.       Meeting was conducted :       In Person  no      Skype   yes         Daily Treatment Team consists of the following:       Pt. Cognitive status:  alert and oriented x 4, pleasant and cooperative, good eye contact, well groomed, recall good, and concentration/judgement good    Pt. Attending Groups: Yes    Pt. Participating in groups:  Yes    Pt. Homicidal / Suicidal: normal    Pt. Behaviors:  Cooperative and Pleasant    Pt. Compliant with Medications:  Yes          New Medication orders:  No      Discharge Plan:  Home    Pt denies SI/HI, A/V hallucinations, and anxiety and depression. Pt reports she is sleeping ok and has good energy. Pt has been tolerating her medications very well. No side effects or concerns noted. Pt has been attending the groups and reports they are helping her. Pt states, "I see a therapist weekly. I just felt really lonely." Staff encouraged pt to get a mental health skill builder to help manage her mental health. Pt reports she does not want to since she does not have insurance.     Possible discharge tomorrow morning.

## 2023-10-27 NOTE — Progress Notes (Signed)
Psychiatric Progress Note      Patient: Mallory Bonilla MRN: [id number]  SSN: [id number]    Date of Birth: 05/01/1966  Age: 57 y.o.  Sex: female      Admit Date: 10/23/2023       Subjective:     Mallory Bonilla overall significant improvement with her mood symptoms compared to admission.  Denied any suicidal or homicidal ideations.  Attending groups working coping skills.  Reported good sleep and appetite.  Denied any side effects of medications.  Reported some improvement in her energy levels compared to admission.  Objective:     Vitals:    10/26/23 0609 10/26/23 0838 10/26/23 1600 10/27/23 0554   BP: (!) 158/80 (!) 158/80 (!) 155/61 (!) 143/72   Pulse: 93  85 84   Resp: 17  18 16    Temp: 97.3 F (36.3 C)  97.7 F (36.5 C) 96.8 F (36 C)   TempSrc: Temporal  Temporal Temporal   SpO2: 96%  99% 98%   Weight:       Height:            Mental Status Exam:     Appearance-fairly groomed  Alert, oriented to self and situation  Speech -normal rate, volume and rhythm  Mood-depressed/anxious-improving  Affect-constricted  Thought process-linear and goal directed  Thought content-no delusions   Thought perception-no auditory or visual hallucinations   No suicidal ideations  Insight fair  Judgement fair    MEDICATIONS:  Current Facility-Administered Medications   Medication Dose Route Frequency    buPROPion (WELLBUTRIN XL) extended release tablet 150 mg  150 mg Oral Daily    carbidopa-levodopa (SINEMET) 10-100 MG per tablet 1 tablet  1 tablet Oral TID    hydroxychloroquine (PLAQUENIL) tablet 200 mg  200 mg Oral BID    lisinopril (PRINIVIL;ZESTRIL) tablet 5 mg  5 mg Oral Daily    pantoprazole (PROTONIX) tablet 40 mg  40 mg Oral QAM AC    pravastatin (PRAVACHOL) tablet 40 mg  40 mg Oral Nightly    SUMAtriptan (IMITREX) tablet 50 mg  50 mg Oral Once    rOPINIRole (REQUIP) tablet 0.25 mg  0.25 mg Oral TID    glucose chewable tablet 16 g  4 tablet Oral PRN    glucagon injection 1 mg  1 mg SubCUTAneous PRN    insulin lispro  (HUMALOG,ADMELOG) injection vial 0-8 Units  0-8 Units SubCUTAneous BID WC    topiramate (TOPAMAX) tablet 50 mg  50 mg Oral Nightly    cephALEXin (KEFLEX) capsule 500 mg  500 mg Oral 4x daily    metFORMIN (GLUCOPHAGE) tablet 500 mg  500 mg Oral BID WC    SUMAtriptan (IMITREX) tablet 50 mg  50 mg Oral BID PRN    busPIRone (BUSPAR) tablet 30 mg  30 mg Oral BID    vilazodone HCl (VIIBRYD) TABS 20 mg  20 mg Oral Nightly    ARIPiprazole (ABILIFY) tablet 5 mg  5 mg Oral Nightly    acetaminophen (TYLENOL) tablet 650 mg  650 mg Oral Q4H PRN    polyethylene glycol (GLYCOLAX) packet 17 g  17 g Oral Daily PRN    hydrOXYzine HCl (ATARAX) tablet 50 mg  50 mg Oral TID PRN    haloperidol (HALDOL) tablet 5 mg  5 mg Oral Q4H PRN    Or    haloperidol lactate (HALDOL) injection 5 mg  5 mg IntraMUSCular Q4H PRN    diphenhydrAMINE (BENADRYL) injection 50 mg  50 mg IntraMUSCular Q4H  PRN    aluminum & magnesium hydroxide-simethicone (MAALOX) 200-200-20 MG/5ML suspension 30 mL  30 mL Oral Q6H PRN        DISCUSSION:  Discussed risk and benefits of medication, provided an opportunity to answer any questions, reviewed discharge goals.    Lab/Data Review:  Recent Results (from the past 24 hour(s))   POCT Glucose    Collection Time: 10/26/23  4:53 PM   Result Value Ref Range    POC Glucose 122 (H) 65 - 100 mg/dL    Performed by: Vista Lawman FATIMA    POCT Glucose    Collection Time: 10/27/23  5:44 AM   Result Value Ref Range    POC Glucose 129 (H) 65 - 100 mg/dL    Performed by: Cranford Mon            Assessment:     Principal Problem (Resolved):    MDD (major depressive disorder), recurrent episode, mild (HCC)  Active Problems:    Major depressive disorder, recurrent episode, severe (HCC)    Borderline personality disorder (HCC)    Generalized anxiety disorder    Patient admitted for worsening of mood symptoms and suicidal ideations.  She was monitored with suicide precautions.  She was started on Wellbutrin XL 150 mg p.o. daily.  She attended  groups and worked on Pharmacologist.  Overall significant improvement with her mood symptoms compared to admission.  She consistently denied any suicidal ideations.    Plan:     Continue on current medications -Wellbutrin XL 150 mg p.o. daily  Continue with therapy  More collateral information  Likely discharge tomorrow    Signed By: Lajuana Carry MD Psychiatry     October 27, 2023

## 2023-10-27 NOTE — Group Note (Signed)
Group Therapy Note    Date: 10/27/2023    Group Start Time: 0930  Group End Time: 1030  Group Topic: Community Meeting    [facility]    Phill Myron        Group Therapy Note    Attendees: 3           Notes:  Patient's Goal:  Get some rest  Slept 4 hrs Ate 1/2 of meals  Depression 1 Anxiety 2 Pain 0 Progress 8      Status After Intervention:  Improved    Participation Level: Active Listener and Interactive    Participation Quality: Appropriate and Attentive      Speech:  normal      Thought Process/Content: Logical  Linear      Affective Functioning: Congruent      Mood: depressed      Level of consciousness:  Alert and Attentive      Response to Learning: Able to retain information and Capable of insight      Endings: None Reported    Modes of Intervention: Support        Discipline Responsible: Microbiologist Health Tech      Signature:  Phill Myron

## 2023-10-27 NOTE — Plan of Care (Signed)
Problem: Chronic Conditions and Co-morbidities  Goal: Patient's chronic conditions and co-morbidity symptoms are monitored and maintained or improved  10/27/2023 2334 by Estanislado Pandy, RN  Outcome: Progressing  10/27/2023 1143 by Royann Shivers, RN  Outcome: Progressing     Problem: Discharge Planning  Goal: Discharge to home or other facility with appropriate resources  Outcome: Progressing     Problem: Pain  Goal: Verbalizes/displays adequate comfort level or baseline comfort level  10/27/2023 2334 by Estanislado Pandy, RN  Outcome: Progressing  10/27/2023 1143 by Royann Shivers, RN  Outcome: Progressing     Problem: Safety - Adult  Goal: Free from fall injury  10/27/2023 2334 by Estanislado Pandy, RN  Outcome: Progressing  10/27/2023 1143 by Royann Shivers, RN  Outcome: Progressing     Problem: Depression  Goal: Will be euthymic at discharge  Description: INTERVENTIONS:  1. Administer medication as ordered  2. Provide emotional support via 1:1 interaction with staff  3. Encourage involvement in milieu/groups/activities  4. Monitor for social isolation  10/27/2023 2334 by Estanislado Pandy, RN  Outcome: Progressing  10/27/2023 1143 by Royann Shivers, RN  Outcome: Progressing     Problem: Anxiety  Goal: Will report anxiety at manageable levels  Description: INTERVENTIONS:  1. Administer medication as ordered  2. Teach and rehearse alternative coping skills  3. Provide emotional support with 1:1 interaction with staff  10/27/2023 2334 by Estanislado Pandy, RN  Outcome: Progressing  10/27/2023 1143 by Royann Shivers, RN  Outcome: Progressing     Problem: Sleep Disturbance  Goal: Will exhibit normal sleeping pattern  Description: INTERVENTIONS:  1. Administer medication as ordered  2. Decrease environmental stimuli, including noise, as appropriate  3. Discourage social isolation and naps during the day  10/27/2023 1143 by Royann Shivers, RN  Outcome: Progressing      Problem: Self Care Deficit  Goal: Return ADL status to a safe level of function  Description: INTERVENTIONS:  1. Administer medication as ordered  2. Assess ADL deficits and provide assistive devices as needed  3. Obtain PT/OT consults as needed  4. Assist and instruct patient to increase activity and self care as tolerated  10/27/2023 2334 by Estanislado Pandy, RN  Outcome: Progressing  10/27/2023 1143 by Royann Shivers, RN  Outcome: Progressing

## 2023-10-28 LAB — POCT GLUCOSE: POC Glucose: 134 mg/dL — ABNORMAL HIGH (ref 65–100)

## 2023-10-28 MED FILL — ROPINIROLE HCL 0.25 MG PO TABS: 0.25 MG | ORAL | Qty: 1

## 2023-10-28 MED FILL — ARIPIPRAZOLE 5 MG PO TABS: 5 MG | ORAL | Qty: 1

## 2023-10-28 MED FILL — CARBIDOPA-LEVODOPA 10-100 MG PO TABS: 10-100 MG | ORAL | Qty: 1

## 2023-10-28 MED FILL — BUSPIRONE HCL 10 MG PO TABS: 10 MG | ORAL | Qty: 3

## 2023-10-28 MED FILL — HYDROXYCHLOROQUINE SULFATE 200 MG PO TABS: 200 MG | ORAL | Qty: 1

## 2023-10-28 MED FILL — CEPHALEXIN 250 MG PO CAPS: 250 MG | ORAL | Qty: 2

## 2023-10-28 MED FILL — LISINOPRIL 5 MG PO TABS: 5 MG | ORAL | Qty: 1

## 2023-10-28 MED FILL — PANTOPRAZOLE SODIUM 40 MG PO TBEC: 40 MG | ORAL | Qty: 1

## 2023-10-28 MED FILL — TOPIRAMATE 25 MG PO TABS: 25 MG | ORAL | Qty: 2

## 2023-10-28 MED FILL — VILAZODONE HCL 20 MG PO TABS: 20 MG | ORAL | Qty: 1

## 2023-10-28 MED FILL — BUPROPION HCL ER (XL) 150 MG PO TB24: 150 MG | ORAL | Qty: 1

## 2023-10-28 MED FILL — METFORMIN HCL 500 MG PO TABS: 500 MG | ORAL | Qty: 1

## 2023-10-28 MED FILL — PRAVASTATIN SODIUM 40 MG PO TABS: 40 MG | ORAL | Qty: 1

## 2023-10-28 NOTE — Behavioral Health Treatment Team (Signed)
Follow up: Writer contacted pt. Pt states she is doing well and was able to follow up with her therapist.

## 2023-10-28 NOTE — Behavioral Health Treatment Team (Signed)
B:   Patient alert and oriented x 4.   Pt. States depression is 0.  Pt. States Anxiety is 3.  Pt. denies Hallucinations.  Pt. denies Delusions.  Pt. denies SI.  Pt. denies HI.   Pt. cooperative with Assessment.  Pt.'s behavior Cooperative and Pleasant. Pt reports she is excited to go home. Pt states, "I'm going to try to keep myself busy. I will go to my therapy appointments and make sure I am taking my medications."       I:    If patient is disoriented, reorient pt.  Build trust with patient, by therapeutic listening and Groups.  Encourage pt. To attend and Participate in Groups.  Provide Medications as ordered and needed.  Encourage pt. To be up for all meals and snacks, and consume all of each. Encourage pt. To interact with staff and peers in a positive manner.  Encourage pt. To keep good hygiene.  Q 15 minute safety checks.    R:   Pt. did attend and Participate in Group.  Pt. Is Compliant with Medications   Yes. Pt is tolerating medications very well. No side effects or concerns noted.  Pt. is getting up for meals and snacks.  Pt. Consumes 100% of Meals.  Pt. is, interacting with Peers.   Pt.'s hygiene is Good.  Pt. does not, have any safety issues.    P:   Pt. Will verbalize one person she can count on for support when going through a mental health crisis before discharge.  Pt. Will continue to comply with Plan of Care toward Discharge.  Pt. Will continue to stay safe on the unit. Pt will discharge this morning.

## 2023-10-28 NOTE — Behavioral Health Treatment Team (Signed)
Discharge instructions given and explained. Signed and states understanding. Belongings returned and signed for.  No SI or HI. Patient discharged with a steady gait. Discharged with wife via private vehicle. No C/O voiced.

## 2023-10-28 NOTE — Behavioral Health Treatment Team (Signed)
Pt received in bed  awake.         Pt attend wrap up group , and ate  snack  . Pt  rated depression as 3 and anxiety as 3 in group paper.        Upon assessment pt denies SI/HI , denies A/V hallucinations ,denies pain.   Pt accepted hS medication, took it with water and pudding        Pt start sleeping by 2215 remained sleeping as of this time  .         no violent no self harming behavior noticed or reported

## 2023-10-28 NOTE — Behavioral Health Treatment Team (Signed)
Pt. Belongings given back to pt., verified all there, signed for. Discharge instructions explained to pt. Including, Follow up appt. With [facility] .      Medications called into [employer], [place].  Pt. Denies SI/HI at time of discharge.     Discharge Paperwork and H & P, faxed to [facility], With success sheet.     Pt. Displayed no safety concerns at discharge.      Pt. Escorted to front by staff for transportation by personal vehicle, to home.

## 2023-10-28 NOTE — Behavioral Health Treatment Team (Signed)
Behavioral Health Transition Record to Provider    Patient Name: Mallory Bonilla  Date of Birth: 07-May-1966  Medical Record Number: [id number]  Date of Admission: 10/23/2023  Date of Discharge: 10/28/2023    Attending Provider: Lajuana Carry, MD  Discharging Provider: Lajuana Carry, MD  To contact this individual call [phone] and ask the operator to page.  If unavailable, ask to be transferred to [facility] Provider on call.  A Behavioral Health Provider will be available on call 24/7 and during holidays.    Primary Care Provider: Catarina Hartshorn, APRN - NP    Allergies   Allergen Reactions    Gabapentin Other (See Comments)     PATIENT CAN'T REMEMBER WHAT IT DID    Lamotrigine Other (See Comments)     At any dose higher than 50 mg once a day experiences dizziness, panic, and headaches.     At any dose higher than 50 mg once a day experiences dizziness, panic, and headaches.    Trazodone And Nefazodone Other (See Comments)     Caused insomnia       Reason for Admission:   REASON FOR HOSPITALIZATION:  CC: "Suicidal ideations".    HISTORY OF PRESENT ILLNESS:    The patient, Mallory Bonilla, is a 57 y.o.  White (non-Hispanic) female with a past psychiatric history significant for major depressive disorder, generalized anxiety disorder and borderline personality disorder admitted to [facility] for worsening of mood symptoms and suicidal ideations.  Stated she is going a lot of stress related to medical problems and feeling more isolated after her daughter moved out for college.  She reported feeling more depressed for the last few weeks.  She also report having suicidal ideation with a plan to overdose on medication.  Reported sleeping a lot recently.  Reported low energy levels and low motivation to do things.  Endorsed anhedonia.  Not able to do things she used to do due to her arthritis.  Feeling hopeless.  Reported low appetite.  Denied any homicidal ideations.  She also  reported feeling more anxious recently.  Denied any recent panic attacks.  Patient with trauma history but denied any symptoms related to PTSD.  Denied any paranoid thoughts, troubles or hallucinations.  Denied any symptoms related to mania.  Reported taking her medications as recommended and denied any side effects of medications.     Admission Diagnosis: Acute cystitis without hematuria [N30.00]  MDD (major depressive disorder), recurrent episode, mild (HCC) [F33.0]  Depression with suicidal ideation [F32.A, R45.851]    * No surgery found *    Results for orders placed or performed during the hospital encounter of 10/23/23   Culture, Urine    Specimen: Urine   Result Value Ref Range    Special Requests No Special Requests      Culture No Growth (<1000 cfu/mL)     CBC with Auto Differential   Result Value Ref Range    WBC 7.0 3.6 - 11.0 K/uL    RBC 5.67 (H) 3.80 - 5.20 M/uL    Hemoglobin 14.3 11.5 - 16.0 g/dL    Hematocrit 86.5 78.4 - 47.0 %    MCV 77.4 (L) 80.0 - 99.0 FL    MCH 25.2 (L) 26.0 - 34.0 PG    MCHC 32.6 30.0 - 36.5 g/dL    RDW 69.6 29.5 - 28.4 %    Platelets 179 150 - 400 K/uL    MPV 10.6 8.9 - 12.9 FL  Nucleated RBCs 0.0 0.0 PER 100 WBC    nRBC 0.00 0.00 - 0.01 K/uL    Neutrophils % 60 32 - 75 %    Lymphocytes % 32 12 - 49 %    Monocytes % 6 5 - 13 %    Eosinophils % 1 0 - 7 %    Basophils % 1 0 - 1 %    Immature Granulocytes % 0 0 - 0.5 %    Neutrophils Absolute 4.1 1.8 - 8.0 K/UL    Lymphocytes Absolute 2.2 0.8 - 3.5 K/UL    Monocytes Absolute 0.4 0.0 - 1.0 K/UL    Eosinophils Absolute 0.1 0.0 - 0.4 K/UL    Basophils Absolute 0.1 0.0 - 0.1 K/UL    Immature Granulocytes Absolute 0.0 0.00 - 0.04 K/UL    Differential Type AUTOMATED     Basic Metabolic Panel   Result Value Ref Range    Sodium 144 136 - 145 mmol/L    Potassium 3.5 3.5 - 5.1 mmol/L    Chloride 108 97 - 108 mmol/L    CO2 24 21 - 32 mmol/L    Anion Gap 12 2 - 12 mmol/L    Glucose 123 (H) 65 - 100 mg/dL    BUN 8 6 - 20 mg/dL    Creatinine  2.95 2.84 - 1.02 mg/dL    BUN/Creatinine Ratio 9 (L) 12 - 20      Est, Glom Filt Rate 72 >60 ml/min/1.66m2    Calcium 9.5 8.5 - 10.1 mg/dL   Ethanol   Result Value Ref Range    Ethanol Lvl <10 <10 mg/dL   Urine Drug Screen   Result Value Ref Range    Amphetamine, Urine Negative Negative      Barbiturates, Urine Negative Negative      Benzodiazepines, Urine Negative Negative      Cocaine, Urine Negative Negative      MDMA, Urine Negative Negative      Methadone, Urine Negative Negative      Opiates, Urine Negative Negative      Phencyclidine, Urine Negative Negative      THC, TH-Cannabinol, Urine Negative Negative      Comments:        This test is a screen for drugs of abuse in a medical setting only (i.e., they are unconfirmed results and as such must not be used for non-medical purposes, e.g.,employment testing, legal testing). Due to its inherent nature, false positive (FP) and false negative (FN) results may be obtained. Therefore, if necessary for medical care, recommend confirmation of positive findings by GC/MS.     Urinalysis   Result Value Ref Range    Color, UA Yellow/Straw      Appearance Clear Clear      Specific Gravity, UA >1.030 (H) 1.003 - 1.030    pH, Urine 6.0 5.0 - 8.0      Protein, UA Trace (A) Negative mg/dL    Glucose, Ur Negative Negative mg/dL    Ketones, Urine Trace (A) Negative mg/dL    Bilirubin, Urine Negative Negative      Blood, Urine Negative Negative      Urobilinogen, Urine 0.2 0.2 - 1.0 EU/dL    Nitrite, Urine Negative Negative      Leukocyte Esterase, Urine Moderate (A) Negative     Acetaminophen Level "IF" accidental or intentional ingestion.   Result Value Ref Range    Acetaminophen Level <2 (L) 10 - 30 ug/mL    DOSE DATE  Not provided      DOSE AMOUNT Not provided Units   Salicylate "IF" accidental or intentional ingestion.   Result Value Ref Range    Salicylate Lvl 2.9 2.8 - 20.0 mg/dL    DOSE DATE Not provided      DOSE AMOUNT Not provided Units   Urinalysis, Micro   Result  Value Ref Range    WBC, UA 50-100 0 - 5 /hpf    RBC, UA 0-5 0 - 3 /hpf    BACTERIA, URINE 3+ (A) Negative /hpf   Hemoglobin A1c   Result Value Ref Range    Hemoglobin A1C 6.2 (H) 4.0 - 5.6 %    Estimated Avg Glucose 131 mg/dL   Lipid Panel   Result Value Ref Range    LIPID PANEL        Cholesterol, Total 159 <200 mg/dL    Triglycerides 098 (H) <150 mg/dL    HDL 50 mg/dL    LDL Cholesterol 11.9 0 - 100 mg/dL    VLDL Cholesterol Calculated 30.2 mg/dL    Chol/HDL Ratio 3.2 0.0 - 5.0     POCT Glucose   Result Value Ref Range    POC Glucose 113 (H) 65 - 100 mg/dL    Performed by: Tyler Pita    POCT Glucose   Result Value Ref Range    POC Glucose 158 (H) 65 - 100 mg/dL    Performed by: Tyler Pita    POCT Glucose   Result Value Ref Range    POC Glucose 138 (H) 65 - 100 mg/dL    Performed by: Cranford Mon    POCT Glucose   Result Value Ref Range    POC Glucose 120 (H) 65 - 100 mg/dL    Performed by: Tyler Pita    POCT Glucose   Result Value Ref Range    POC Glucose 132 (H) 65 - 100 mg/dL    Performed by: Cranford Mon    POCT Glucose   Result Value Ref Range    POC Glucose 122 (H) 65 - 100 mg/dL    Performed by: Vista Lawman Javelle Donigan    POCT Glucose   Result Value Ref Range    POC Glucose 129 (H) 65 - 100 mg/dL    Performed by: Cranford Mon    POCT Glucose   Result Value Ref Range    POC Glucose 151 (H) 65 - 100 mg/dL    Performed by: Tyler Pita    POCT Glucose   Result Value Ref Range    POC Glucose 134 (H) 65 - 100 mg/dL    Performed by: [facility] MOHAMMED        Immunizations administered during this encounter:   There is no immunization history on file for this patient.        Did patient receive Flu Vaccine: Refused            Vital Signs/Blood Pressure  BP 136/67   Pulse 83   Temp 97.3 F (36.3 C) (Temporal)   Resp 16   Ht 1.651 m (5\' 5" )   Wt 88.5 kg (195 lb)   SpO2 97%   BMI 32.45 kg/m                 Discharge Diagnosis: Major depressive disorder, recurrent episode, severe (HCC)   Borderline  personality disorder (HCC)   Generalized anxiety disorder    Discharge Plan for Follow up Therapy and Medication management:   Pt will follow  up with [facility] today from 0830-1300    Discharge Medication List and Instructions:      Medication List        START taking these medications      buPROPion 150 MG extended release tablet  Commonly known as: WELLBUTRIN XL  Take 1 tablet by mouth daily     cephALEXin 500 MG capsule  Commonly known as: KEFLEX  Take 1 capsule by mouth in the morning, at noon, in the evening, and at bedtime for 8 doses     hydrOXYzine HCl 50 MG tablet  Commonly known as: ATARAX  Take 1 tablet by mouth 3 times daily as needed for Anxiety     rOPINIRole 0.25 MG tablet  Commonly known as: REQUIP            CHANGE how you take these medications      busPIRone 30 MG tablet  Commonly known as: BUSPAR  Take 30 mg by mouth 2 times daily  What changed: See the new instructions.            CONTINUE taking these medications      ARIPiprazole 5 MG tablet  Commonly known as: ABILIFY     carbidopa-levodopa 10-100 MG per tablet  Commonly known as: SINEMET     Emgality 120 MG/ML Soaj  Generic drug: Galcanezumab-gnlm     hydroxychloroquine 200 MG tablet  Commonly known as: PLAQUENIL     lisinopril 5 MG tablet  Commonly known as: PRINIVIL;ZESTRIL     metFORMIN 500 MG tablet  Commonly known as: GLUCOPHAGE     pantoprazole 40 MG tablet  Commonly known as: PROTONIX  Take 1 tablet by mouth once daily     pravastatin 40 MG tablet  Commonly known as: PRAVACHOL     rizatriptan 5 MG tablet  Commonly known as: MAXALT     topiramate 50 MG tablet  Commonly known as: TOPAMAX     True Metrix Blood Glucose Test strip  Generic drug: blood glucose test strips     Trulicity 0.75 MG/0.5ML Sopn SC injection  Generic drug: dulaglutide     vilazodone HCl 20 MG Tabs  Commonly known as: VIIBRYD     Vitamin D3 1.25 MG (50000 UT) Caps            STOP taking these medications      clonazePAM 1 MG tablet  Commonly known as: KLONOPIN      ibuprofen 800 MG tablet  Commonly known as: ADVIL;MOTRIN     influenza quadrivalent split vaccine 0.5 ML injection  Commonly known as: FLUZONE;FLUARIX;FLULAVAL;AFLURIA            ASK your doctor about these medications      estradiol-norethindrone 1-0.5 MG per tablet  Commonly known as: ACTIVELLA  Take 1 tablet by mouth daily     Tradjenta 5 MG tablet  Generic drug: linagliptin               Where to Get Your Medications        These medications were sent to [facility] [address], [place] - [address] - P [phone] Carmon Ginsberg [phone]  [address], [place] [address]      Phone: [phone]   buPROPion 150 MG extended release tablet  busPIRone 30 MG tablet  cephALEXin 500 MG capsule  hydrOXYzine HCl 50 MG tablet         Pending Labs: No    If yes, what labs  are pending?    To obtain results of studies pending at discharge,  if any, please contact [phone]    Follow up Plan Upon Discharge for Follow up Therapy and Medication Management:  Pt will follow up with [facility] today from 0830-1300    Advanced Directive:   Does the patient have an appointed surrogate decision maker? No  Does the patient have a Medical Advance Directive? No    Does the patient have a Psychiatric Advance Directive? No  If the patient does not have a surrogate or Medical Advance Directive AND Psychiatric Advance Directive, the patient was offered information on these advance directives Patient declined to complete    Smoking:       Pt. is not a smoker.    If pt. Is a smoker, was smoking cessation education provided at discharge? N/A    If pt. Is a smoker, were they referred to smoking cessation appointment? N/A     Refused?    N/A     If pt. Is a smoker, were they offered smoking cessation medication at discharge? N/A     Refused?    N/A          Substance Abuse/ Alcohol:    Pt. is not a drinker / substance abuser.    If pt. Is a drinker, was Alcohol Education Provided at discharge? N/A    If pt. Is a drinker / Substance  abuser, were they referred to treatment?  N/A        Refused?    N/A    If pt. Is a drinker / Substance abuser, was medication offered for cessation at discharge? N/A       Refused?     N/A          Patient Instructions: Please continue all medications until otherwise directed by physician.         Patient Transition Record Discussed with Patient and copy given to patient, with pt return verbalization of understanding?   Yes            Patient discharged to Home      If pt. Was NOT discharged home and was discharged to another facility, were the following discussed with other facility?     Our 24-hour/7 day content information, including physician for emergencies related to inpatient stay? N/A    2.    The Contact information for Pending studies, which is the [phone]?  N/A    3.    Pt's plan for follow up, as noted above in follow up?  N/A    4.    Pt's primary Physician, other healthcare professional, or site designated for follow-up care? N/A

## 2024-07-14 ENCOUNTER — Encounter

## 2024-07-26 ENCOUNTER — Inpatient Hospital Stay: Admit: 2024-07-26 | Payer: MEDICARE | Primary: Family

## 2024-07-30 ENCOUNTER — Encounter

## 2024-07-30 MED ORDER — PANTOPRAZOLE SODIUM 40 MG PO TBEC
40 | ORAL_TABLET | Freq: Every day | ORAL | 3 refills | 30.00000 days | Status: AC
Start: 2024-07-30 — End: ?

## 2024-11-09 ENCOUNTER — Inpatient Hospital Stay: Admit: 2024-11-09 | Discharge: 2024-11-09 | Payer: MEDICARE | Primary: Family
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
## 2024-11-09 NOTE — BH Note (Signed)
"  SAFETY PLAN      A suicide Safety Plan is a document that supports someone when they are having thoughts of suicide.      Warning Signs that indicate a suicidal crisis may be developing: What (situations, thoughts, feelings, body sensations, behaviors, etc.) do you experience that lets you know you are beginning to think about suicide?   1.  staying in bed for long periods of time  2.  low energy  3.  limited movement     Internal Coping Strategies:  What things can I do (relaxation techniques, hobbies, physical activities, etc.) to take my mind off my problems without contacting another person?   1.  listening to music  2.  deep breathing sometimes  3.  affirmations     People and social settings that provide distraction: Who can I call or where can I go to distract me?   1. Name:  Sister in law Phone:  in pt's phone  2. Name:  Sister and brother Phone:  in pt's phone  3. Place:            4. Place:       People whom I can ask for help: Who can I call when I need help - for example, friends, family, clergy, someone else?   1. Name:  Sister in law Phone:  in pt's phone  2. Name:  Sister and brother Phone: in pt's phone  3. Name:  partner Phone:  in pt's phone     Professionals or Behavioral Health agencies I can contact during a crisis: Who can I call for help - for example, my doctor, my psychiatrist, my psychologist, a mental health provider, a suicide hotline?   Clinician Name: [facility] Therapist - DeJon Mayo   Phone: [phone]      Clinician Pager or Emergency Contact #: [phone]     2. Suicide Prevention Lifeline: 1-800-273-TALK (8255)     3. Local Behavioral Health Emergency Services -  for example, [facility], local county suicide hotline, [employer] Hotline:  [facility]     Emergency Services Address: [place] location: [address] [place] [address]        Emergency Services Phone: [phone]     4. Suicide Prevention Lifeline: 1-800-273-TALK 763-169-3769)     Making the  environment safe: How can I make my environment (house/apartment/living space) safer? For example, can I remove guns, medications, and other items?  N/a  "

## 2024-11-10 ENCOUNTER — Inpatient Hospital Stay: Admit: 2024-11-10 | Discharge: 2024-11-10 | Payer: MEDICARE | Primary: Family

## 2024-11-10 DIAGNOSIS — N3000 Acute cystitis without hematuria: Secondary | ICD-10-CM

## 2024-11-10 DIAGNOSIS — F330 Major depressive disorder, recurrent, mild: Secondary | ICD-10-CM

## 2024-11-10 DIAGNOSIS — F419 Anxiety disorder, unspecified: Principal | ICD-10-CM

## 2024-11-10 NOTE — BH Note (Signed)
 "   r     BH Note      Signed     Date of Service: 11/11/2024 12:00 PM     Signed            Evia Lenon [facility]  Specialty: Behavioral Health Services     Group Note      Signed     Date of Service: 11/11/2024 9:00 AM      Signed                                                                                 Group Therapy Note     Date: 11/11/2024     Group Start Time 9:00 AM  Group End Time: 9:50 AM  Group Topic:Community Meeting     [facility]    Margel Joens             Group Therapy Note     Attendees: Clinical Research Associate facilitated group therapy session and administered check - in sheets to Identify Strengths and  gauge mental health and well-being.Pt's were encouraged to be open about the strengths that they posses. Writer introduced opportunity to reflect on SMART goals that can be reset if not met  Patient explored the significance of identifying personal strengths and discussed how to applying these strengths can promote positive change , resilience and confidence in daily life.        Patient's Goal:  To participate in group therapy. Complete check - in sheet, Reflect with peers. Assess SMART goal.      Notes:  Patient was present for group therapy session and completed check - in sheet successfully. Pt denied SI/HI/AVH and presented as open, alert, and fixated on anxiety. Pt agreed to work on deep breathing outside of IOP and to report back on 11/120/25. Pt will continue working toward identified treatment goals and opening up more. Pt looks forward to meeting and working with other patients that are soon to join the program.     Status After Intervention:  Improved     Participation Level: Active Listener and Interactive     Participation Quality: Appropriate, Attentive, Sharing, and Supportive        Speech:  normal        Thought Process/Content: Perseverating        Affective Functioning: Congruent        Mood: anxious        Level of consciousness:  Alert, Oriented x4,  and Attentive        Response to Learning: Able to verbalize current knowledge/experience, Able to verbalize/acknowledge new learning, Able to retain information, Capable of insight, Able to change behavior, and Progressing to goal        Endings: None Reported     Modes of Intervention: Support, Socialization, and Exploration        Discipline Responsible: Social Worker/Counselor        Signature: Jalisa Sacco [employer]                      "

## 2024-11-10 NOTE — BH Note (Addendum)
"     Mayo, DeJon, MSW  Social Worker  Specialty: Licensed Clinical Social Worker     Group Note      Signed     Date of Service: 11/10/2024  9:00 AM     Signed                                                                                Group Therapy Note     Date: 11/10/2024     Group Start Time:  9:00 AM  Group End Time:  9:50 AM  Group Topic: Community Meeting     [facility]    Mayo, DeJon, MSW; Lenon Snuffer           Group Therapy Note     Attendees: Writer facilitated group therapy session and prepped patient with expectations and schedule for the day. Writer distributed check-in and sign in sheets to complete and gauge overall mental health and well-being. Writer transitioned to unleash the sun flower activity activity where patients were asked to talk about something they enjoyed over the past few days, something they found challenging, and something they are looking forward to over the next few days. Pt's were encouraged to process and think of the positives associated with each situation/event.      Patient's Goal:  To participate in group therapy session. Complete check-in sheets. Complete unleash the sunflower activity.     Notes:  Patient was present for group therapy session and remained on-site for duration of allocated time. Pt presented as open, alert, and oriented x4. Pt completed check-in sheet with no issues or extra prompting from facilitator. Pt denied SI/HI/AVH and is working to develop more comfort with new environment. Pt will continue working towards identified treatment goals. Pt was able to take away a sense of independence from the discussion and not having to rely on those around her to take care of things she has desire to complete of her own volition in regard to anxiety.     Status After Intervention:  Improved     Participation Level: Active Listener and Interactive     Participation Quality: Appropriate, Attentive, and Sharing        Speech:  normal        Thought  Process/Content: Logical        Affective Functioning: Congruent        Mood: anxious        Level of consciousness:  Alert, Oriented x4, and Attentive        Response to Learning: Able to verbalize current knowledge/experience, Able to verbalize/acknowledge new learning, Able to retain information, Capable of insight, Able to change behavior, and Progressing to goal        Endings: None Reported     Modes of Intervention: Education, Socialization, Exploration, and Activity        Discipline Responsible: Social Worker/Counselor        Signature:  Probation Officer, MSW                  [employer], MSW    "

## 2024-11-10 NOTE — BH Note (Signed)
"     Mayo, Chales, MSW  Social Worker  Specialty: Licensed Clinical Social Worker     Group Note      Signed     Date of Service: 11/10/2024 12:00 PM     Signed                                                                                Group Therapy Note     Date: 11/10/2024     Group Start Time: 12:00 PM  Group End Time: 12:30 PM  Group Topic: Wrap-Up     [facility]    Mayo, DeJon, MSW           Group Therapy Note     Attendees: Clinical Research Associate facilitated group therapy session and administered wrap-up sheets to gauge mental health and well-being at conclusion of PHP day. Pt's were encouraged to be open and vulnerable with their responses. Writer introduced opportunity to reflect on SMART goals that can be reset if not met. Writer concluded with prompting pt's about the significance associated with the rest of the PHP day and allocated time for processing.        Patient's Goal:  To participate in group therapy. Complete wrap-up sheet. Reflect with peers. Assess SMART goal.      Notes:  Patient was present for group therapy session and completed wrap-up sheet successfully. Pt denied SI/HI/AVH and presented as open, alert, and fixated on anxiety. Pt expressed feeling comfortable in group space and having anxiety ease over time. Pt will continue working toward identified treatment goals and opening up more. Pt looks forward to meeting and working with other patients that are soon to join the program.     Status After Intervention:  Improved     Participation Level: Sports Coach and Interactive     Participation Quality: Appropriate, Attentive, Sharing, and Supportive        Speech:  normal        Thought Process/Content: Perseverating        Affective Functioning: Congruent        Mood: anxious        Level of consciousness:  Alert, Oriented x4, and Attentive        Response to Learning: Able to verbalize current knowledge/experience, Able to verbalize/acknowledge new learning, Able to retain information,  Capable of insight, Able to change behavior, and Progressing to goal        Endings: None Reported     Modes of Intervention: Support, Socialization, and Exploration        Discipline Responsible: Social Worker/Counselor        Signature:  Probation Officer, MSW                  [employer], MSW    "

## 2024-11-10 NOTE — BH Note (Signed)
"     Mayo, Chales, MSW  Social Worker  Specialty: Licensed Clinical Social Worker     Group Note      Signed     Date of Service: 11/10/2024 11:00 AM     Signed                                                                                Group Therapy Note     Date: 11/10/2024     Group Start Time: 11:00 AM  Group End Time: 11:50 AM  Group Topic: Psychoeducation     [facility]    Mayo, DeJon, MSW; Lenon Snuffer           Group Therapy Note     Attendees:      Writer facilitated group therapy session and worked with patients on recognizing stress in all of the ways in which it manifests; physical, emotional, and behavioral. Writer guided pt's through each manifestation and progressed to explaining the process of mindfulness meditation. Writer encouraged pt's inquire and ask questions pertaining to other ways of engaging in mindfulness. Writer iterated the standards and procedures when talking about SI,HI, and SH. Pt's were receptive.      Patient's Goal:  To participate in group therapy. Identify key manifestations of stress. List 3 ways to reduce stress.      Notes:  Patient was present for group therapy session and fully participated in planned discussion and education around recognizing stress. Pt denied SI/HI/AVH and will continue working towards identified treatment goals. Pt presented as informative and was expressive in sharing her personal experiences with stress including triggers and how it affects her.     Status After Intervention:  Improved     Participation Level: Sports Coach and Interactive     Participation Quality: Appropriate, Attentive, and Sharing        Speech:  normal        Thought Process/Content: Logical        Affective Functioning: Congruent        Mood: euthymic        Level of consciousness:  Alert, Oriented x4, and Attentive        Response to Learning: Able to verbalize current knowledge/experience, Able to verbalize/acknowledge new learning, Able to retain  information, Capable of insight, Able to change behavior, and Progressing to goal        Endings: None Reported     Modes of Intervention: Support, Socialization, and Exploration        Discipline Responsible: Social Worker/Counselor        Signature:  Probation Officer, MSW                  [employer], MSW    "

## 2024-11-10 NOTE — BH Note (Signed)
"     Wilfrid Butters, MSW  Social Worker  Specialty: Licensed Clinical Social Worker     Group Note      Signed     Date of Service: 11/10/2024  8:45 AM     Signed                                                                                Group Therapy Note     Date: 11/10/2024     Group Start Time: 10:00 AM  Group End Time: 10:50 AM  Group Topic: Process Group - Outpatient     [facility]    Mayo, DeJon, MSW           Group Therapy Note     Attendees: Clinical Research Associate facilitated process group. Writer encouraged pts to share issues on which they want support with problem solving, issues they need to process with peers, or events they want to celebrate. Writer prompted and supported peer feedback. Writer provided validation and feedback as well as utilized open ended questions to support further exploration of topics.        Patient's Goal:  To participate in group therapy. Alleviate stress, doubt, and other worries. Engage with peers for support and validation.      Notes:  Patient was present for IOP group therapy session and fully participated sharing complex feelings and emotions that challenged pt's thought process. Pt denied SI/HI/AVH throughout entirety of allocated time. Pt presented as open, fixed, and oriented x4. Pt was supportive of others.     Status After Intervention:  Improved     Participation Level: Sports Coach and Interactive     Participation Quality: Appropriate, Attentive, Sharing, and Supportive        Speech:  normal        Thought Process/Content: Logical        Affective Functioning: Congruent        Mood: euthymic        Level of consciousness:  Alert, Oriented x4, and Attentive        Response to Learning: Able to verbalize current knowledge/experience, Able to verbalize/acknowledge new learning, Able to retain information, Capable of insight, Able to change behavior, and Progressing to goal        Endings: None Reported     Modes of Intervention: Support, Socialization, and  Exploration        Discipline Responsible: Social Worker/Counselor5        Signature:  Probation Officer, MSW                  [employer], MSW    "

## 2024-11-11 ENCOUNTER — Inpatient Hospital Stay: Admit: 2024-11-11 | Discharge: 2024-11-11 | Payer: MEDICARE | Primary: Family

## 2024-11-11 DIAGNOSIS — F331 Major depressive disorder, recurrent, moderate: Principal | ICD-10-CM

## 2024-11-11 NOTE — BH Note (Signed)
 "   Mayo, DeJon, MSW  Social Worker  Specialty: Licensed Clinical Social Worker     BH Note     Signed     Date of Service: 11/11/2024 12:00 PM     Signed            Wilfrid Butters, MSW  Social Worker  Specialty: Licensed Clinical Social Worker     Group Note      Signed     Date of Service: 11/11/2024 12:00 PM      Signed                                                                                 Group Therapy Note     Date: 11/11/2024     Group Start Time: 12:00 PM  Group End Time: 12:30 PM  Group Topic: Wrap-Up     [facility]    Mayo, DeJon, MSW           Group Therapy Note     Attendees: Clinical Research Associate facilitated group therapy session and administered wrap-up sheets to gauge mental health and well-being at conclusion of PHP day. Pt's were encouraged to be open and vulnerable with their responses. Writer introduced opportunity to reflect on SMART goals that can be reset if not met. Writer concluded with prompting pt's about the significance associated with the rest of the PHP day and allocated time for processing.        Patient's Goal:  To participate in group therapy. Complete wrap-up sheet. Reflect with peers. Assess SMART goal.      Notes:  Patient was present for group therapy session and completed wrap-up sheet successfully. Pt denied SI/HI/AVH and presented as open, alert, and fixated on anxiety. Pt expressed feeling comfortable in group space and having anxiety ease over time. Pt will continue working toward identified treatment goals and opening up more. Pt looks forward to meeting and working with other patients that are soon to join the program.     Status After Intervention:  Improved     Participation Level: Sports Coach and Interactive     Participation Quality: Appropriate, Attentive, Sharing, and Supportive        Speech:  normal        Thought Process/Content: Perseverating        Affective Functioning: Congruent        Mood: anxious        Level of consciousness:  Alert, Oriented x4, and  Attentive        Response to Learning: Able to verbalize current knowledge/experience, Able to verbalize/acknowledge new learning, Able to retain information, Capable of insight, Able to change behavior, and Progressing to goal        Endings: None Reported     Modes of Intervention: Support, Socialization, and Exploration        Discipline Responsible: Social Worker/Counselor        Signature:  Probation Officer, MSW                   [employer], MSW  DeJon Mayo, MSW    "

## 2024-11-11 NOTE — BH Note (Signed)
 "   Mayo, DeJon, MSW  Social Worker  Specialty: Licensed Clinical Social Worker     BH Note     Addendum     Date of Service: 11/11/2024  9:00 AM          Mayo, Chales, MSW  Social Worker  Specialty: Licensed Clinical Social Worker     Group Note      Signed     Date of Service: 11/11/2024  9:00 AM      Signed                                                                                 Group Therapy Note     Date: 11/11/2024     Group Start Time:  9:00 AM  Group End Time:  9:50 AM  Group Topic: Community Meeting     [facility]    Mayo, DeJon, MSW; Lenon Snuffer           Group Therapy Note     Attendees: Writer facilitated group therapy session and prepped patient with expectations and schedule for the day. Writer distributed check-in and sign in sheets to complete and gauge overall mental health and well-being. Writer transitioned to unleash the sun flower activity activity where patients were asked to talk about something they enjoyed over the past few days, something they found challenging, and something they are looking forward to over the next few days. Pt's were encouraged to process and think of the positives associated with each situation/event.      Patient's Goal:  To participate in group therapy session. Complete check-in sheets. Complete unleash the sunflower activity.     Notes:  Patient was present for group therapy session and remained on-site for duration of allocated time. Pt presented as open, alert, and oriented x4. Pt completed check-in sheet with no issues or extra prompting from facilitator. Pt denied SI/HI/AVH and is working to develop more comfort with new environment. Pt will continue working towards identified treatment goals. Pt was able to take away a sense of independence from the discussion and not having to rely on those around her to take care of things she has desire to complete of her own volition in regard to anxiety.     Status After Intervention:  Improved      Participation Level: Active Listener and Interactive     Participation Quality: Appropriate, Attentive, and Sharing        Speech:  normal        Thought Process/Content: Logical        Affective Functioning: Congruent        Mood: anxious        Level of consciousness:  Alert, Oriented x4, and Attentive        Response to Learning: Able to verbalize current knowledge/experience, Able to verbalize/acknowledge new learning, Able to retain information, Capable of insight, Able to change behavior, and Progressing to goal        Endings: None Reported     Modes of Intervention: Education, Socialization, Exploration, and Activity        Discipline Responsible: Social Worker/Counselor  Signature:  DeJon Mayo, MSW                   DeJon Mayo, MSW             Revision History     Chales Cooley, MSW    "

## 2024-11-11 NOTE — BH Note (Signed)
"     Mayo, Chales, MSW  Social Worker  Specialty: Licensed Clinical Social Worker     BH Note     Signed     Date of Service: 11/11/2024 10:00 AM     Signed            Wilfrid Chales, MSW  Social Worker  Specialty: Licensed Clinical Social Worker     Group Note      Signed     Date of Service: 11/11/2024  10:00 AM      Signed                                                                                 Group Therapy Note     Date: 11/11/2024     Group Start Time: 10:00 AM  Group End Time: 10:50 AM  Group Topic: Process Group - Outpatient     [facility]    Mayo, DeJon, MSW           Group Therapy Note     Attendees: Clinical Research Associate facilitated process group. Writer encouraged pts to share issues on which they want support with problem solving, issues they need to process with peers, or events they want to celebrate. Writer prompted and supported peer feedback. Writer provided validation and feedback as well as utilized open ended questions to support further exploration of topics.        Patient's Goal:  To participate in group therapy. Alleviate stress, doubt, and other worries. Engage with peers for support and validation.      Notes:  Patient was present for IOP group therapy session and fully participated sharing complex feelings and emotions that challenged pt's thought process. Pt denied SI/HI/AVH throughout entirety of allocated time. Pt presented as open, fixed, and oriented x4. Pt was supportive of others.     Status After Intervention:  Improved     Participation Level: Sports Coach and Interactive     Participation Quality: Appropriate, Attentive, Sharing, and Supportive        Speech:  normal        Thought Process/Content: Logical        Affective Functioning: Congruent        Mood: euthymic        Level of consciousness:  Alert, Oriented x4, and Attentive        Response to Learning: Able to verbalize current knowledge/experience, Able to verbalize/acknowledge new learning, Able to retain information,  Capable of insight, Able to change behavior, and Progressing to goal        Endings: None Reported     Modes of Intervention: Support, Socialization, and Exploration        Discipline Responsible: Social Worker/Counselor5        Signature:  Probation Officer, MSW                   [employer], MSW                    [employer], MSW    "

## 2024-11-11 NOTE — Group Note (Addendum)
"                                                                        Group Therapy Note    Date: 11/11/2024    Group Start Time: 11:00 AM  Group End Time: 11:50 AM  Group Topic: Psychoeducation    [facility]    Mayo, DeJon, MSW        Group Therapy Note    Attendees: Clinical Research Associate facilitated group therapy session and administered check-in sheets to gauge mental health and well-being going into PHP day. Pt's were encouraged to be open and vulnerable with their responses. Writer introduced opportunity to [employer] goals that can be accomplished throughout PHP day. Writer concluded with prompting pt's about the significance associated with the day and allocated time for processing.   To participate in group therapy. Complete check-in sheet. Reflect with peers. Set SMART goal. Process current goals.       Patient's Goal:  To participate in group therapy. Complete check-in sheet. Reflect with peers. Set SMART goal. Process current goals.    Notes:  Patient was present for group therapy session and denied SI/HI/AVH. PT was supportive of others processing progress towards establishing SMART goals. Pt presented as alert, oriented x4, and expressive. Pt will continue working towards identified treatment goals.    Status After Intervention:  Improved    Participation Level: Active Listener and Interactive    Participation Quality: Appropriate, Attentive, Sharing, and Supportive      Speech:  normal      Thought Process/Content: Logical      Affective Functioning: Congruent      Mood: euthymic      Level of consciousness:  Alert, Oriented x4, and Attentive      Response to Learning: Able to verbalize current knowledge/experience, Able to verbalize/acknowledge new learning, Able to retain information, Capable of insight, Able to change behavior, and Progressing to goal      Endings: None Reported    Modes of Intervention: Support, Socialization, Exploration, and Clarifying      Discipline Responsible: Social  Worker/Counselor      Signature:  Chales Cooley, MSW    "

## 2024-11-15 ENCOUNTER — Inpatient Hospital Stay: Payer: MEDICARE | Primary: Family

## 2024-11-17 ENCOUNTER — Inpatient Hospital Stay: Payer: MEDICARE | Primary: Family

## 2024-11-18 ENCOUNTER — Ambulatory Visit: Payer: MEDICARE | Primary: Family

## 2024-11-22 ENCOUNTER — Ambulatory Visit: Payer: MEDICARE | Primary: Family

## 2024-11-24 ENCOUNTER — Ambulatory Visit: Payer: MEDICARE | Primary: Family

## 2024-11-29 ENCOUNTER — Inpatient Hospital Stay: Payer: MEDICARE | Primary: Family

## 2024-12-01 ENCOUNTER — Ambulatory Visit: Payer: MEDICARE | Primary: Family

## 2024-12-02 ENCOUNTER — Ambulatory Visit: Payer: MEDICARE | Primary: Family

## 2024-12-06 ENCOUNTER — Ambulatory Visit: Payer: MEDICARE | Primary: Family

## 2024-12-08 ENCOUNTER — Ambulatory Visit: Payer: MEDICARE | Primary: Family

## 2024-12-09 ENCOUNTER — Ambulatory Visit: Payer: MEDICARE | Primary: Family

## 2024-12-13 ENCOUNTER — Ambulatory Visit: Payer: MEDICARE | Primary: Family

## 2024-12-15 ENCOUNTER — Ambulatory Visit: Payer: MEDICARE | Primary: Family

## 2024-12-16 ENCOUNTER — Ambulatory Visit: Payer: MEDICARE | Primary: Family

## 2025-01-03 NOTE — ED Triage Notes (Signed)
"  Pt states that she may have the flu. Symptoms started last night with a cough and today has been throwing up since this morning. Pt states that what she has taken has not been helping.   "

## 2025-01-03 NOTE — ED Notes (Signed)
"  Patient stable at time of discharge. Reviewed discharge instructions and education. Patient verbalized understanding. Patient states no questions at this time.   "

## 2025-01-03 NOTE — ED Notes (Signed)
 "[facility]  EMERGENCY DEPARTMENT HISTORY AND PHYSICAL EXAM      Date of evaluation: 01/03/2025  Patient Name: Mallory Bonilla  MRN: [id number]  ED Physician or APP: Lamar ORN. Rilie Glanz, MD   Note Started: 10:02 PM EST 01/03/25  Chief Complaint:   Chief Complaint   Patient presents with    Cold Symptoms       HISTORY OF PRESENT ILLNESS   History Provided By: Patient     HPI: Mallory Bonilla is a 58 y.o. female presenting with complaint of myalgia, cough and nausea vomiting that started last night.  PAST MEDICAL HISTORY   Past Medical History:  Past Medical History:   Diagnosis Date    Acid reflux 06/13/2021    Agoraphobia     Anxiety 01/11/2021    Anxiety     Arthritis     Arthritis, rheumatoid (HCC)     Bipolar 1 disorder (HCC) 01/11/2021    COVID-19 10/09/2021    Diabetes (HCC)     Diabetes mellitus type 2, controlled (HCC) 01/11/2021    Essential tremor     Gastritis     Headache     Headache 01/11/2021    Hypercholesterolemia     Hyperlipidemia 01/11/2021    Hypertension     Hypertension 01/11/2021    Menopause     PMB (postmenopausal bleeding)     Throat pain        Past Surgical History:  Past Surgical History:   Procedure Laterality Date    COLONOSCOPY  05/30/2017    ESOPHAGEAL DILATATION N/A 10/14/2023    . performed by Myriam Nivia Raddle., MD at [facility] ENDOSCOPY    GYN      uterine ablasion    ORTHOPEDIC SURGERY      foot surgery    OVARY REMOVAL      UPPER GASTROINTESTINAL ENDOSCOPY N/A 10/14/2023    ESOPHAGOGASTRODUODENOSCOPY WITH DILATION (MALONEY) performed by Myriam Nivia Raddle., MD at [facility] ENDOSCOPY       Allergies:  Allergies   Allergen Reactions    Gabapentin Other (See Comments)     PATIENT CAN'T REMEMBER WHAT IT DID    Lamotrigine Other (See Comments)     At any dose higher than 50 mg once a day experiences dizziness, panic, and headaches.     At any dose higher than 50 mg once a day experiences dizziness, panic, and headaches.    Trazodone And Nefazodone Other (See Comments)     Caused insomnia       PCP:  Deloria Damien ORN, APRN - NP  PHYSICAL EXAM       ED TRIAGE VITALS  BP: (!) 177/68, Temp: 99.7 F (37.6 C), Pulse: (!) 133, Respirations: 19, SpO2: 96 %  Physical Exam  Vitals and nursing note reviewed.   Constitutional:       General: She is not in acute distress.     Appearance: Normal appearance.   HENT:      Head: Normocephalic and atraumatic.      Right Ear: External ear normal.      Left Ear: External ear normal.      Nose: Nose normal.      Mouth/Throat:      Pharynx: Oropharynx is clear.   Cardiovascular:      Rate and Rhythm: Normal rate and regular rhythm.      Pulses: Normal pulses.      Heart sounds: Normal heart sounds.   Pulmonary:  Effort: Pulmonary effort is normal.      Breath sounds: Normal breath sounds.   Abdominal:      Palpations: Abdomen is soft.      Tenderness: There is no abdominal tenderness.   Musculoskeletal:      Cervical back: Normal range of motion.   Skin:     General: Skin is warm.      Capillary Refill: Capillary refill takes less than 2 seconds.   Neurological:      Mental Status: She is alert.        SCREENINGS                No data recorded     LAB, EKG AND IMAGING RESULTS   Labs:  Recent Results (from the past 12 hours)   COVID-19 & Influenza Combo    Collection Time: 01/03/25  8:57 PM    Specimen: Nasopharynx   Result Value Ref Range    SARS-CoV-2, PCR Not Detected Not Detected      Rapid Influenza A By PCR DETECTED (A) Not Detected      Rapid Influenza B By PCR Not Detected Not Detected         EKG:.Not Applicable     Radiologic Studies:  Images preliminarily interpreted by the ED Provider with the following findings: Not Applicable..     Radiologist Interpretation at the time of this note:   No orders to display        Records Reviewed: I reviewed old record.    MEDICAL DECISION MAKING and ED COURSE   10:02 PM Differential and Considerations of tests not ordered: 59 year old female presents with flulike symptoms.  I suspect influenza.  Flu swab is positive.  She has  increased risk of flu complications due to multiple chronic medical problems.  Patient was given Tamiflu .     ED Vitals:    Vitals:    01/03/25 2130 01/03/25 2138 01/03/25 2139 01/03/25 2140   BP:  (!) 177/68     Pulse:  (!) 139 (!) 136 (!) 133   Resp:       Temp: 99.7 F (37.6 C)      TempSrc: Oral      SpO2:  95% 94% 96%   Weight:       Height:           Medications Given:   Medications   oseltamivir  (TAMIFLU ) capsule 75 mg (has no administration in time range)   ondansetron  (ZOFRAN -ODT) disintegrating tablet 4 mg (has no administration in time range)       ED COURSE       Clinical Management Tools:     Smoking Cessation: Not Applicable    CONSULTS: See ED Course/MDM for further details.  None     PROCEDURES   See ED course/ MDM for any additional procedures.  Procedures      SEPSIS REASSESSMENT & CRITICAL CARE TIME   SEPSIS REASSESSMENT: No suspicion of bacterial infection and not having 2 SIRS during this visit.    Critical Care: Patient does not meet Critical Care Time, 0 minutes  CLINICAL IMPRESSIONS     1. Influenza with respiratory manifestation other than pneumonia       SDOH/DISPOSITION/PLAN   Social Determinants affecting Treatment Plan: None    DISPOSITION Decision To Discharge 01/03/2025 10:01:36 PM   DISPOSITION CONDITION Stable        Discharge Note: The patient is stable for discharge. The signs, symptoms, diagnosis, and discharge instructions  have been discussed, understanding conveyed, and agreed upon. The patient is to follow up as recommended or return to ER should their symptoms worsen.   FOLLOWUPBETHA [name], APRN - NP  [address]  [place] [address]  [phone]      As needed  DISCHARGE MEDS:      Medication List        START taking these medications      ondansetron  4 MG tablet  Commonly known as: ZOFRAN   Take 1 tablet by mouth 3 times daily as needed for Nausea or Vomiting     oseltamivir  75 MG capsule  Commonly known as: Tamiflu   Take 1 capsule by mouth 2 times daily for 5  days            ASK your doctor about these medications      ARIPiprazole  5 MG tablet  Commonly known as: ABILIFY      buPROPion  150 MG extended release tablet  Commonly known as: WELLBUTRIN  XL  Take 1 tablet by mouth daily     carbidopa -levodopa  10-100 MG per tablet  Commonly known as: SINEMET      Emgality 120 MG/ML Soaj  Generic drug: Galcanezumab-gnlm     estradiol -norethindrone  1-0.5 MG per tablet  Commonly known as: ACTIVELLA  Take 1 tablet by mouth daily     hydroxychloroquine  200 MG tablet  Commonly known as: PLAQUENIL      lisinopril  5 MG tablet  Commonly known as: PRINIVIL ;ZESTRIL      metFORMIN  500 MG tablet  Commonly known as: GLUCOPHAGE      pantoprazole  40 MG tablet  Commonly known as: PROTONIX   Take 1 tablet by mouth once daily     pravastatin  40 MG tablet  Commonly known as: PRAVACHOL      rizatriptan 5 MG tablet  Commonly known as: MAXALT     rOPINIRole  0.25 MG tablet  Commonly known as: REQUIP      topiramate  50 MG tablet  Commonly known as: TOPAMAX      Tradjenta 5 MG tablet  Generic drug: linagliptin     True Metrix Blood Glucose Test strip  Generic drug: blood glucose test strips     Trulicity 0.75 MG/0.5ML Sopn SC injection  Generic drug: dulaglutide     vilazodone  HCl 20 MG Tabs  Commonly known as: VIIBRYD      Vitamin D3 1.25 MG (50000 UT) Caps               Where to Get Your Medications        These medications were sent to [facility] [address] [place] - [address] - P [phone] [facility]  [address], [place] [address]      Phone: [phone]   ondansetron  4 MG tablet  oseltamivir  75 MG capsule           I am the Primary Clinician of Record. Lamar LELON. Maysel Mccolm, MD (electronically signed)    (Please note that parts of this dictation were completed with voice recognition software. Quite often unanticipated grammatical, syntax, homophones, and other interpretive errors are inadvertently transcribed by the computer software. Please disregards these errors. Please excuse any errors  that have escaped final proofreading.)       Yazlynn Birkeland, Lamar LELON, MD  01/03/25 2329    "

## 2025-01-04 ENCOUNTER — Inpatient Hospital Stay
Admit: 2025-01-04 | Discharge: 2025-01-04 | Disposition: A | Discharge status: Home / Self Care | Payer: MEDICARE | Arrived: VH | Attending: Emergency Medicine

## 2025-01-04 LAB — COVID-19 & INFLUENZA COMBO
Rapid Influenza A By PCR: DETECTED — AB
Rapid Influenza B By PCR: NOT DETECTED
SARS-CoV-2, PCR: NOT DETECTED

## 2025-01-04 MED ORDER — ONDANSETRON 4 MG PO TBDP
4 | ORAL | Status: AC
Start: 2025-01-04 — End: 2025-01-03
  Administered 2025-01-04: 03:00:00 4 mg via ORAL

## 2025-01-04 MED ORDER — OSELTAMIVIR PHOSPHATE 75 MG PO CAPS
75 | ORAL | Status: AC
Start: 2025-01-04 — End: 2025-01-03
  Administered 2025-01-04: 03:00:00 75 mg via ORAL

## 2025-01-04 MED ORDER — ONDANSETRON HCL 4 MG PO TABS
4 | ORAL_TABLET | Freq: Three times a day (TID) | ORAL | 0 refills | 7.00000 days | Status: AC | PRN
Start: 2025-01-04 — End: ?

## 2025-01-04 MED ORDER — OSELTAMIVIR PHOSPHATE 75 MG PO CAPS
75 | ORAL_CAPSULE | Freq: Two times a day (BID) | ORAL | 0 refills | 3.00000 days | Status: AC
Start: 2025-01-04 — End: 2025-01-08

## 2025-01-04 MED FILL — OSELTAMIVIR PHOSPHATE 75 MG PO CAPS: 75 mg | ORAL | Qty: 1 | Fill #0

## 2025-01-04 MED FILL — ONDANSETRON 4 MG PO TBDP: 4 mg | ORAL | Qty: 1 | Fill #0
